# Patient Record
Sex: Female | Born: 1970
Health system: Southern US, Community
[De-identification: ages and names within clinical notes are randomized; demographics above are authoritative.]

## PROBLEM LIST (undated history)

## (undated) DIAGNOSIS — J449 Chronic obstructive pulmonary disease, unspecified: Secondary | ICD-10-CM

## (undated) DIAGNOSIS — E7800 Pure hypercholesterolemia, unspecified: Secondary | ICD-10-CM

## (undated) DIAGNOSIS — I10 Essential (primary) hypertension: Secondary | ICD-10-CM

## (undated) DIAGNOSIS — J439 Emphysema, unspecified: Secondary | ICD-10-CM

## (undated) DIAGNOSIS — E119 Type 2 diabetes mellitus without complications: Secondary | ICD-10-CM

## (undated) DIAGNOSIS — J45909 Unspecified asthma, uncomplicated: Secondary | ICD-10-CM

## (undated) HISTORY — PX: CYST EXCISION: SHX5701

---

## 2004-09-11 ENCOUNTER — Emergency Department (HOSPITAL_COMMUNITY): Admission: EM | Admit: 2004-09-11 | Discharge: 2004-09-11 | Payer: Self-pay | Admitting: Emergency Medicine

## 2005-01-25 ENCOUNTER — Inpatient Hospital Stay (HOSPITAL_COMMUNITY): Admission: EM | Admit: 2005-01-25 | Discharge: 2005-01-27 | Payer: Self-pay | Admitting: Emergency Medicine

## 2005-04-09 ENCOUNTER — Emergency Department (HOSPITAL_COMMUNITY): Admission: EM | Admit: 2005-04-09 | Discharge: 2005-04-09 | Payer: Self-pay | Admitting: Emergency Medicine

## 2005-04-09 ENCOUNTER — Ambulatory Visit: Payer: Self-pay | Admitting: Family Medicine

## 2005-04-10 ENCOUNTER — Ambulatory Visit: Payer: Self-pay | Admitting: *Deleted

## 2005-05-04 ENCOUNTER — Ambulatory Visit: Payer: Self-pay | Admitting: Family Medicine

## 2005-07-28 ENCOUNTER — Ambulatory Visit: Payer: Self-pay | Admitting: Internal Medicine

## 2005-10-13 ENCOUNTER — Ambulatory Visit: Payer: Self-pay | Admitting: Internal Medicine

## 2006-02-24 ENCOUNTER — Ambulatory Visit: Payer: Self-pay | Admitting: Internal Medicine

## 2006-06-01 ENCOUNTER — Ambulatory Visit: Payer: Self-pay | Admitting: Internal Medicine

## 2006-08-08 ENCOUNTER — Emergency Department (HOSPITAL_COMMUNITY): Admission: AD | Admit: 2006-08-08 | Discharge: 2006-08-08 | Payer: Self-pay | Admitting: Family Medicine

## 2008-09-05 ENCOUNTER — Emergency Department (HOSPITAL_COMMUNITY): Admission: EM | Admit: 2008-09-05 | Discharge: 2008-09-05 | Payer: Self-pay | Admitting: Emergency Medicine

## 2008-10-11 ENCOUNTER — Ambulatory Visit: Payer: Self-pay | Admitting: Internal Medicine

## 2008-11-04 ENCOUNTER — Emergency Department (HOSPITAL_COMMUNITY): Admission: EM | Admit: 2008-11-04 | Discharge: 2008-11-04 | Payer: Self-pay | Admitting: Emergency Medicine

## 2009-01-07 ENCOUNTER — Emergency Department (HOSPITAL_COMMUNITY): Admission: EM | Admit: 2009-01-07 | Discharge: 2009-01-07 | Payer: Self-pay | Admitting: Emergency Medicine

## 2009-02-06 ENCOUNTER — Ambulatory Visit: Payer: Self-pay | Admitting: Internal Medicine

## 2009-03-06 ENCOUNTER — Ambulatory Visit: Payer: Self-pay | Admitting: Internal Medicine

## 2009-03-06 LAB — CONVERTED CEMR LAB
ALT: 13 units/L (ref 0–35)
AST: 9 units/L (ref 0–37)
Albumin: 4.1 g/dL (ref 3.5–5.2)
BUN: 15 mg/dL (ref 6–23)
CO2: 22 meq/L (ref 19–32)
Calcium: 9.4 mg/dL (ref 8.4–10.5)
Chloride: 103 meq/L (ref 96–112)
Cholesterol: 194 mg/dL (ref 0–200)
Creatinine, Ser: 0.81 mg/dL (ref 0.40–1.20)
Eosinophils Absolute: 0 10*3/uL (ref 0.0–0.7)
Eosinophils Relative: 0 % (ref 0–5)
HCT: 44.4 % (ref 36.0–46.0)
HDL: 99 mg/dL (ref >39)
Hemoglobin: 15.1 g/dL — ABNORMAL HIGH (ref 12.0–15.0)
LH: 1.4 milliintl units/mL
Lymphocytes Relative: 37 % (ref 12–46)
Lymphs Abs: 4.8 10*3/uL — ABNORMAL HIGH (ref 0.7–4.0)
MCV: 87.4 fL (ref 78.0–100.0)
Monocytes Relative: 6 % (ref 3–12)
Potassium: 3.9 meq/L (ref 3.5–5.3)
RBC: 5.08 M/uL (ref 3.87–5.11)
TSH: 1.246 microintl units/mL (ref 0.350–4.500)
Total CHOL/HDL Ratio: 2
WBC: 12.9 10*3/uL — ABNORMAL HIGH (ref 4.0–10.5)

## 2009-04-02 ENCOUNTER — Emergency Department (HOSPITAL_COMMUNITY): Admission: EM | Admit: 2009-04-02 | Discharge: 2009-04-03 | Payer: Self-pay | Admitting: Emergency Medicine

## 2009-06-07 ENCOUNTER — Ambulatory Visit: Payer: Self-pay | Admitting: Internal Medicine

## 2009-07-17 ENCOUNTER — Ambulatory Visit: Payer: Self-pay | Admitting: Family Medicine

## 2009-07-19 ENCOUNTER — Ambulatory Visit: Payer: Self-pay | Admitting: Internal Medicine

## 2009-08-25 ENCOUNTER — Emergency Department (HOSPITAL_COMMUNITY): Admission: EM | Admit: 2009-08-25 | Discharge: 2009-08-25 | Payer: Self-pay | Admitting: Emergency Medicine

## 2009-08-29 ENCOUNTER — Ambulatory Visit: Payer: Self-pay | Admitting: Internal Medicine

## 2009-09-26 ENCOUNTER — Ambulatory Visit: Payer: Self-pay | Admitting: Internal Medicine

## 2009-11-02 ENCOUNTER — Emergency Department (HOSPITAL_COMMUNITY): Admission: EM | Admit: 2009-11-02 | Discharge: 2009-11-02 | Payer: Self-pay | Admitting: Emergency Medicine

## 2009-11-15 ENCOUNTER — Observation Stay (HOSPITAL_COMMUNITY): Admission: EM | Admit: 2009-11-15 | Discharge: 2009-11-15 | Payer: Self-pay | Admitting: Emergency Medicine

## 2009-11-25 ENCOUNTER — Emergency Department (HOSPITAL_COMMUNITY): Admission: EM | Admit: 2009-11-25 | Discharge: 2009-11-25 | Payer: Self-pay | Admitting: Emergency Medicine

## 2009-11-26 ENCOUNTER — Ambulatory Visit: Payer: Self-pay | Admitting: Internal Medicine

## 2009-11-26 LAB — CONVERTED CEMR LAB: Microalb, Ur: 4.6 mg/dL — ABNORMAL HIGH (ref 0.00–1.89)

## 2009-12-03 ENCOUNTER — Ambulatory Visit (HOSPITAL_COMMUNITY): Admission: RE | Admit: 2009-12-03 | Discharge: 2009-12-03 | Payer: Self-pay | Admitting: Internal Medicine

## 2009-12-12 ENCOUNTER — Encounter (INDEPENDENT_AMBULATORY_CARE_PROVIDER_SITE_OTHER): Payer: Self-pay | Admitting: *Deleted

## 2010-02-15 ENCOUNTER — Emergency Department (HOSPITAL_COMMUNITY): Admission: EM | Admit: 2010-02-15 | Discharge: 2010-02-15 | Payer: Self-pay | Admitting: Family Medicine

## 2010-03-03 ENCOUNTER — Emergency Department (HOSPITAL_COMMUNITY): Admission: EM | Admit: 2010-03-03 | Discharge: 2010-03-03 | Payer: Self-pay | Admitting: Emergency Medicine

## 2010-06-11 ENCOUNTER — Emergency Department (HOSPITAL_COMMUNITY): Admission: EM | Admit: 2010-06-11 | Discharge: 2010-06-11 | Payer: Self-pay | Admitting: Family Medicine

## 2010-07-27 ENCOUNTER — Ambulatory Visit: Payer: Self-pay | Admitting: Pulmonary Disease

## 2010-07-27 ENCOUNTER — Ambulatory Visit: Payer: Self-pay | Admitting: Cardiology

## 2010-07-27 ENCOUNTER — Inpatient Hospital Stay (HOSPITAL_COMMUNITY): Admission: EM | Admit: 2010-07-27 | Discharge: 2010-07-31 | Payer: Self-pay | Admitting: Emergency Medicine

## 2010-07-30 ENCOUNTER — Encounter (INDEPENDENT_AMBULATORY_CARE_PROVIDER_SITE_OTHER): Payer: Self-pay | Admitting: Family Medicine

## 2010-09-26 ENCOUNTER — Emergency Department (HOSPITAL_COMMUNITY): Admission: EM | Admit: 2010-09-26 | Discharge: 2010-09-26 | Payer: Self-pay | Admitting: Family Medicine

## 2010-10-29 ENCOUNTER — Emergency Department (HOSPITAL_COMMUNITY): Admission: EM | Admit: 2010-10-29 | Discharge: 2010-10-29 | Payer: Self-pay | Admitting: Family Medicine

## 2010-11-06 ENCOUNTER — Encounter (INDEPENDENT_AMBULATORY_CARE_PROVIDER_SITE_OTHER): Payer: Self-pay

## 2010-11-06 ENCOUNTER — Inpatient Hospital Stay (HOSPITAL_COMMUNITY): Admission: EM | Admit: 2010-11-06 | Discharge: 2010-11-09 | Payer: Self-pay | Admitting: Emergency Medicine

## 2011-01-11 ENCOUNTER — Encounter: Payer: Self-pay | Admitting: Internal Medicine

## 2011-02-09 ENCOUNTER — Ambulatory Visit (INDEPENDENT_AMBULATORY_CARE_PROVIDER_SITE_OTHER): Payer: Self-pay

## 2011-02-09 ENCOUNTER — Inpatient Hospital Stay (INDEPENDENT_AMBULATORY_CARE_PROVIDER_SITE_OTHER)
Admission: RE | Admit: 2011-02-09 | Discharge: 2011-02-09 | Disposition: A | Discharge status: Home / Self Care | Payer: Self-pay | Source: Ambulatory Visit | Attending: Emergency Medicine | Admitting: Emergency Medicine

## 2011-02-09 DIAGNOSIS — R062 Wheezing: Secondary | ICD-10-CM

## 2011-02-09 DIAGNOSIS — J45909 Unspecified asthma, uncomplicated: Secondary | ICD-10-CM

## 2011-02-09 LAB — POCT RAPID STREP A (OFFICE): Streptococcus, Group A Screen (Direct): NEGATIVE

## 2011-03-03 LAB — POCT URINALYSIS DIPSTICK
Nitrite: NEGATIVE
Protein, ur: NEGATIVE mg/dL
pH: 6 (ref 5.0–8.0)

## 2011-03-03 LAB — GLUCOSE, CAPILLARY
Glucose-Capillary: 151 mg/dL — ABNORMAL HIGH (ref 70–99)
Glucose-Capillary: 185 mg/dL — ABNORMAL HIGH (ref 70–99)
Glucose-Capillary: 190 mg/dL — ABNORMAL HIGH (ref 70–99)
Glucose-Capillary: 192 mg/dL — ABNORMAL HIGH (ref 70–99)
Glucose-Capillary: 228 mg/dL — ABNORMAL HIGH (ref 70–99)
Glucose-Capillary: 251 mg/dL — ABNORMAL HIGH (ref 70–99)
Glucose-Capillary: 263 mg/dL — ABNORMAL HIGH (ref 70–99)
Glucose-Capillary: 291 mg/dL — ABNORMAL HIGH (ref 70–99)
Glucose-Capillary: 299 mg/dL — ABNORMAL HIGH (ref 70–99)

## 2011-03-03 LAB — URINALYSIS, ROUTINE W REFLEX MICROSCOPIC
Glucose, UA: >1000 mg/dL — AB
Ketones, ur: 15 mg/dL — AB
Protein, ur: NEGATIVE mg/dL
pH: 6 (ref 5.0–8.0)

## 2011-03-03 LAB — BASIC METABOLIC PANEL
BUN: 10 mg/dL (ref 6–23)
BUN: 2 mg/dL — ABNORMAL LOW (ref 6–23)
Calcium: 8.2 mg/dL — ABNORMAL LOW (ref 8.4–10.5)
Calcium: 8.8 mg/dL (ref 8.4–10.5)
Creatinine, Ser: 1.02 mg/dL (ref 0.4–1.2)
GFR calc non Af Amer: >60 mL/min (ref >60)
GFR calc non Af Amer: >60 mL/min (ref >60)
Glucose, Bld: 281 mg/dL — ABNORMAL HIGH (ref 70–99)
Glucose, Bld: 442 mg/dL — ABNORMAL HIGH (ref 70–99)
Potassium: 3.7 mEq/L (ref 3.5–5.1)

## 2011-03-03 LAB — URINE MICROSCOPIC-ADD ON

## 2011-03-03 LAB — POCT I-STAT 7, (LYTES, BLD GAS, ICA,H+H)
Bicarbonate: 28 mEq/L — ABNORMAL HIGH (ref 20.0–24.0)
Hemoglobin: 13.9 g/dL (ref 12.0–15.0)
Patient temperature: 39
TCO2: 29 mmol/L (ref 0–100)
pCO2 arterial: 52.8 mmHg — ABNORMAL HIGH (ref 35.0–45.0)
pH, Arterial: 7.342 — ABNORMAL LOW (ref 7.350–7.400)
pO2, Arterial: 55 mmHg — ABNORMAL LOW (ref 80.0–100.0)

## 2011-03-03 LAB — DIFFERENTIAL
Eosinophils Absolute: 0 10*3/uL (ref 0.0–0.7)
Monocytes Absolute: 0.8 10*3/uL (ref 0.1–1.0)
Neutro Abs: 10.2 10*3/uL — ABNORMAL HIGH (ref 1.7–7.7)
Neutrophils Relative %: 72 % (ref 43–77)

## 2011-03-03 LAB — ANAEROBIC CULTURE

## 2011-03-03 LAB — CULTURE, ROUTINE-ABSCESS

## 2011-03-03 LAB — GRAM STAIN

## 2011-03-03 LAB — CULTURE, BLOOD (ROUTINE X 2)
Culture  Setup Time: 201111170849
Culture: NO GROWTH

## 2011-03-03 LAB — CBC
HCT: 35.6 % — ABNORMAL LOW (ref 36.0–46.0)
MCHC: 33.4 g/dL (ref 30.0–36.0)
MCHC: 35.4 g/dL (ref 30.0–36.0)
Platelets: 275 10*3/uL (ref 150–400)
RDW: 12.4 % (ref 11.5–15.5)
RDW: 12.4 % (ref 11.5–15.5)
WBC: 14.1 10*3/uL — ABNORMAL HIGH (ref 4.0–10.5)

## 2011-03-03 LAB — TSH: TSH: 1.993 u[IU]/mL (ref 0.350–4.500)

## 2011-03-03 LAB — POCT PREGNANCY, URINE
Preg Test, Ur: NEGATIVE
Preg Test, Ur: NEGATIVE

## 2011-03-04 LAB — POCT URINALYSIS DIPSTICK
Protein, ur: 30 mg/dL — AB
Specific Gravity, Urine: 1.025 (ref 1.005–1.030)
Urobilinogen, UA: 0.2 mg/dL (ref 0.0–1.0)
pH: 5.5 (ref 5.0–8.0)

## 2011-03-06 LAB — CARDIAC PANEL(CRET KIN+CKTOT+MB+TROPI)
CK, MB: 1.2 ng/mL (ref 0.3–4.0)
Relative Index: INVALID (ref 0.0–2.5)
Total CK: 56 U/L (ref 7–177)
Total CK: 57 U/L (ref 7–177)
Troponin I: 0.01 ng/mL (ref 0.00–0.06)
Troponin I: 0.02 ng/mL (ref 0.00–0.06)

## 2011-03-06 LAB — GLUCOSE, CAPILLARY
Glucose-Capillary: 263 mg/dL — ABNORMAL HIGH (ref 70–99)
Glucose-Capillary: 274 mg/dL — ABNORMAL HIGH (ref 70–99)
Glucose-Capillary: 300 mg/dL — ABNORMAL HIGH (ref 70–99)
Glucose-Capillary: 359 mg/dL — ABNORMAL HIGH (ref 70–99)
Glucose-Capillary: 377 mg/dL — ABNORMAL HIGH (ref 70–99)
Glucose-Capillary: 390 mg/dL — ABNORMAL HIGH (ref 70–99)
Glucose-Capillary: 407 mg/dL — ABNORMAL HIGH (ref 70–99)
Glucose-Capillary: 444 mg/dL — ABNORMAL HIGH (ref 70–99)

## 2011-03-06 LAB — URINE MICROSCOPIC-ADD ON

## 2011-03-06 LAB — POCT I-STAT, CHEM 8
Calcium, Ion: 1.06 mmol/L — ABNORMAL LOW (ref 1.12–1.32)
HCT: 41 % (ref 36.0–46.0)
Hemoglobin: 13.9 g/dL (ref 12.0–15.0)
Sodium: 139 mEq/L (ref 135–145)
TCO2: 18 mmol/L (ref 0–100)

## 2011-03-06 LAB — BASIC METABOLIC PANEL
BUN: 2 mg/dL — ABNORMAL LOW (ref 6–23)
CO2: 18 mEq/L — ABNORMAL LOW (ref 19–32)
CO2: 19 mEq/L (ref 19–32)
Calcium: 7 mg/dL — ABNORMAL LOW (ref 8.4–10.5)
Calcium: 7.8 mg/dL — ABNORMAL LOW (ref 8.4–10.5)
GFR calc Af Amer: >60 mL/min (ref >60)
GFR calc Af Amer: >60 mL/min (ref >60)
GFR calc non Af Amer: >60 mL/min (ref >60)
GFR calc non Af Amer: >60 mL/min (ref >60)
Glucose, Bld: 213 mg/dL — ABNORMAL HIGH (ref 70–99)
Glucose, Bld: 260 mg/dL — ABNORMAL HIGH (ref 70–99)
Glucose, Bld: 358 mg/dL — ABNORMAL HIGH (ref 70–99)
Potassium: 3 mEq/L — ABNORMAL LOW (ref 3.5–5.1)
Potassium: 3.5 mEq/L (ref 3.5–5.1)
Potassium: 4.1 mEq/L (ref 3.5–5.1)
Sodium: 135 mEq/L (ref 135–145)
Sodium: 138 mEq/L (ref 135–145)
Sodium: 139 mEq/L (ref 135–145)

## 2011-03-06 LAB — URINALYSIS, ROUTINE W REFLEX MICROSCOPIC
Bilirubin Urine: NEGATIVE
Glucose, UA: 250 mg/dL — AB
Ketones, ur: NEGATIVE mg/dL
Specific Gravity, Urine: 1.011 (ref 1.005–1.030)
pH: 5.5 (ref 5.0–8.0)

## 2011-03-06 LAB — DIFFERENTIAL
Basophils Absolute: 0 10*3/uL (ref 0.0–0.1)
Basophils Relative: 0 % (ref 0–1)
Eosinophils Absolute: 0.1 10*3/uL (ref 0.0–0.7)
Eosinophils Relative: 1 % (ref 0–5)
Monocytes Absolute: 0.1 10*3/uL (ref 0.1–1.0)
Monocytes Relative: 1 % — ABNORMAL LOW (ref 3–12)
Neutro Abs: 6.5 10*3/uL (ref 1.7–7.7)

## 2011-03-06 LAB — PROTIME-INR
INR: 1.23 (ref 0.00–1.49)
INR: 1.25 (ref 0.00–1.49)
Prothrombin Time: 15.7 seconds — ABNORMAL HIGH (ref 11.6–15.2)

## 2011-03-06 LAB — PROCALCITONIN: Procalcitonin: 21.33 ng/mL

## 2011-03-06 LAB — CBC
HCT: 32.7 % — ABNORMAL LOW (ref 36.0–46.0)
HCT: 32.8 % — ABNORMAL LOW (ref 36.0–46.0)
HCT: 32.8 % — ABNORMAL LOW (ref 36.0–46.0)
Hemoglobin: 10.8 g/dL — ABNORMAL LOW (ref 12.0–15.0)
Hemoglobin: 11.1 g/dL — ABNORMAL LOW (ref 12.0–15.0)
Hemoglobin: 11.2 g/dL — ABNORMAL LOW (ref 12.0–15.0)
Hemoglobin: 13.4 g/dL (ref 12.0–15.0)
MCH: 29.5 pg (ref 26.0–34.0)
MCH: 30.1 pg (ref 26.0–34.0)
MCH: 30.3 pg (ref 26.0–34.0)
MCHC: 33 g/dL (ref 30.0–36.0)
MCHC: 33.8 g/dL (ref 30.0–36.0)
MCHC: 34.1 g/dL (ref 30.0–36.0)
MCHC: 34.3 g/dL (ref 30.0–36.0)
MCV: 88.9 fL (ref 78.0–100.0)
Platelets: 221 10*3/uL (ref 150–400)
RBC: 3.78 MIL/uL — ABNORMAL LOW (ref 3.87–5.11)
RBC: 4.42 MIL/uL (ref 3.87–5.11)
RDW: 12.9 % (ref 11.5–15.5)
WBC: 9.5 10*3/uL (ref 4.0–10.5)

## 2011-03-06 LAB — CULTURE, BLOOD (ROUTINE X 2)
Culture: NO GROWTH
Culture: NO GROWTH

## 2011-03-06 LAB — POCT I-STAT 3, ART BLOOD GAS (G3+)
pCO2 arterial: 30.9 mmHg — ABNORMAL LOW (ref 35.0–45.0)
pH, Arterial: 7.342 — ABNORMAL LOW (ref 7.350–7.400)
pO2, Arterial: 73 mmHg — ABNORMAL LOW (ref 80.0–100.0)

## 2011-03-06 LAB — TYPE AND SCREEN: Antibody Screen: NEGATIVE

## 2011-03-06 LAB — HEMOGLOBIN A1C
Hgb A1c MFr Bld: 9 % — ABNORMAL HIGH (ref <5.7)
Mean Plasma Glucose: 212 mg/dL — ABNORMAL HIGH (ref <117)

## 2011-03-06 LAB — APTT: aPTT: 32 seconds (ref 24–37)

## 2011-03-06 LAB — LACTIC ACID, PLASMA: Lactic Acid, Venous: 1.3 mmol/L (ref 0.5–2.2)

## 2011-03-06 LAB — COMPREHENSIVE METABOLIC PANEL
ALT: 21 U/L (ref 0–35)
BUN: 5 mg/dL — ABNORMAL LOW (ref 6–23)
CO2: 22 mEq/L (ref 19–32)
Calcium: 7.2 mg/dL — ABNORMAL LOW (ref 8.4–10.5)
Creatinine, Ser: 0.66 mg/dL (ref 0.4–1.2)
Glucose, Bld: 274 mg/dL — ABNORMAL HIGH (ref 70–99)
Sodium: 140 mEq/L (ref 135–145)

## 2011-03-06 LAB — VANCOMYCIN, TROUGH: Vancomycin Tr: 15.9 ug/mL (ref 10.0–20.0)

## 2011-03-06 LAB — POCT CARDIAC MARKERS: Myoglobin, poc: 51.5 ng/mL (ref 12–200)

## 2011-03-06 LAB — MAGNESIUM: Magnesium: 1.7 mg/dL (ref 1.5–2.5)

## 2011-03-06 LAB — PHOSPHORUS
Phosphorus: 2 mg/dL — ABNORMAL LOW (ref 2.3–4.6)
Phosphorus: 2.4 mg/dL (ref 2.3–4.6)

## 2011-03-06 LAB — CARBOXYHEMOGLOBIN: Methemoglobin: 0.6 % (ref 0.0–1.5)

## 2011-03-06 LAB — BRAIN NATRIURETIC PEPTIDE: Pro B Natriuretic peptide (BNP): 64 pg/mL (ref 0.0–100.0)

## 2011-03-06 LAB — URINE CULTURE: Culture  Setup Time: 201108071756

## 2011-03-06 LAB — MRSA PCR SCREENING: MRSA by PCR: NEGATIVE

## 2011-03-06 LAB — POCT PREGNANCY, URINE: Preg Test, Ur: NEGATIVE

## 2011-03-06 LAB — ABO/RH: ABO/RH(D): O POS

## 2011-03-08 LAB — URINE CULTURE

## 2011-03-08 LAB — POCT URINALYSIS DIP (DEVICE)
Bilirubin Urine: NEGATIVE
Glucose, UA: 100 mg/dL — AB
Nitrite: POSITIVE — AB
Specific Gravity, Urine: 1.02 (ref 1.005–1.030)
Urobilinogen, UA: 0.2 mg/dL (ref 0.0–1.0)

## 2011-03-08 LAB — POCT PREGNANCY, URINE: Preg Test, Ur: NEGATIVE

## 2011-03-11 LAB — POCT URINALYSIS DIP (DEVICE)
Bilirubin Urine: NEGATIVE
Ketones, ur: NEGATIVE mg/dL
Protein, ur: 30 mg/dL — AB
pH: 5 (ref 5.0–8.0)

## 2011-03-25 LAB — POCT I-STAT, CHEM 8
BUN: 5 mg/dL — ABNORMAL LOW (ref 6–23)
Calcium, Ion: 1.09 mmol/L — ABNORMAL LOW (ref 1.12–1.32)
Creatinine, Ser: 0.7 mg/dL (ref 0.4–1.2)
Glucose, Bld: 315 mg/dL — ABNORMAL HIGH (ref 70–99)
TCO2: 19 mmol/L (ref 0–100)

## 2011-03-25 LAB — URINE CULTURE

## 2011-03-25 LAB — URINE MICROSCOPIC-ADD ON

## 2011-03-25 LAB — URINALYSIS, ROUTINE W REFLEX MICROSCOPIC
Bilirubin Urine: NEGATIVE
Ketones, ur: 15 mg/dL — AB
Specific Gravity, Urine: 1.017 (ref 1.005–1.030)
pH: 7 (ref 5.0–8.0)

## 2011-03-27 LAB — COMPREHENSIVE METABOLIC PANEL
AST: 18 U/L (ref 0–37)
CO2: 24 mEq/L (ref 19–32)
Calcium: 9 mg/dL (ref 8.4–10.5)
Creatinine, Ser: 0.8 mg/dL (ref 0.4–1.2)
GFR calc Af Amer: >60 mL/min (ref >60)
GFR calc non Af Amer: >60 mL/min (ref >60)

## 2011-03-27 LAB — CBC
MCHC: 34 g/dL (ref 30.0–36.0)
MCV: 88.7 fL (ref 78.0–100.0)
RBC: 4.45 MIL/uL (ref 3.87–5.11)
RDW: 12.6 % (ref 11.5–15.5)

## 2011-03-27 LAB — URINE MICROSCOPIC-ADD ON

## 2011-03-27 LAB — URINALYSIS, ROUTINE W REFLEX MICROSCOPIC
Bilirubin Urine: NEGATIVE
Glucose, UA: 100 mg/dL — AB
Specific Gravity, Urine: 1.012 (ref 1.005–1.030)
pH: 6 (ref 5.0–8.0)

## 2011-03-27 LAB — LIPASE, BLOOD: Lipase: 22 U/L (ref 11–59)

## 2011-03-27 LAB — WET PREP, GENITAL
Trich, Wet Prep: NONE SEEN
Yeast Wet Prep HPF POC: NONE SEEN

## 2011-03-27 LAB — DIFFERENTIAL
Eosinophils Relative: 0 % (ref 0–5)
Lymphocytes Relative: 18 % (ref 12–46)
Lymphs Abs: 2.6 10*3/uL (ref 0.7–4.0)
Neutrophils Relative %: 77 % (ref 43–77)

## 2011-03-27 LAB — URINE CULTURE

## 2011-03-27 LAB — HCG, SERUM, QUALITATIVE: Preg, Serum: NEGATIVE

## 2011-04-21 ENCOUNTER — Inpatient Hospital Stay (INDEPENDENT_AMBULATORY_CARE_PROVIDER_SITE_OTHER)
Admission: RE | Admit: 2011-04-21 | Discharge: 2011-04-21 | Disposition: A | Discharge status: Home / Self Care | Payer: Self-pay | Source: Ambulatory Visit

## 2011-04-21 ENCOUNTER — Ambulatory Visit (INDEPENDENT_AMBULATORY_CARE_PROVIDER_SITE_OTHER): Payer: Self-pay

## 2011-04-21 DIAGNOSIS — J45909 Unspecified asthma, uncomplicated: Secondary | ICD-10-CM

## 2011-05-08 NOTE — Discharge Summary (Signed)
Carla Hicks, Carla Hicks              ACCOUNT NO.:  [id number]   MEDICAL RECORD NO.:  [id number]          PATIENT TYPE:  INP   LOCATION:  0471                         FACILITY:  [facility]   PHYSICIAN:  Melissa L. Ladona Ridgel, MD  DATE OF BIRTH:  1971/10/20   DATE OF ADMISSION:  01/24/2005  DATE OF DISCHARGE:  01/27/2005                                 DISCHARGE SUMMARY   DISCHARGE DIAGNOSES:  1.  Right middle lobe pneumonia:  Patient's pulmonary course had been      complicated by the fact that she is on chronic prednisone and is further      compromised by a history of alpha anti-tripsin deficiency.  The      patient's saturations today revealed 85% on two liters.  I have      expressed to this patient that I would prefer his stay in the hospital      for further IV antibiotics and care until it is obvious that her      pneumonia has resolved enough to maintain reasonable saturations.  The      patient refuses to stay, citing that she feels she is neglecting her      children, who are also ill.  We attempted to reason with the patient,      but she was unable to extract the possibility that she herself could      become increasingly ill, further causing her to be neglectful of her      children.  Patient has a Education officer, environmental, who she is unwilling to charge with      the task of providing for the care of her children while she is in the      hospital.  Patient will therefore leave against medical advice.  I will      provide her with antibiotics, home oxygen.  The patient has home      nebulizers and has a CPAP machine.  We will request that she follow up      with her outpatient physician as early as the end of this week.  2.  History of hypertension:  At this time, the patient's blood pressure      remains stable.  She did not appear to be septic.  3.  Patient has a history of insulin resistance and hyperglycemia.  At      present, her CBGs have ranged from 150 as high as 242.  At this time, I  am uncomfortable starting her on medication for diabetes and will      request that she follow up with her primary care practitioner later this      week for evaluation.   DISCHARGE MEDICATIONS:  1.  Prednisone 60 mg q.d., which is her home dose.  2.  Albuterol 2.5 mg nebulizer q.4h. and Atrovent 0.5 mg q.4h.  3.  Moxifloxacin 400 mg p.o. q.d.  (This has been reconsidered because of      the obvious urinary tract infection).  We will start her on Cipro 500      p.o. q.12h. and azithromycin 500 p.o. q.d.  4.  O2 2 liters via nasal cannula.   HISTORY OF PRESENT ILLNESS:  This is a 40 year old African-American female  with known alpha antitrypsin deficiency, who presented to the emergency room  on the day of admission with nausea, vomiting, and productive cough.  The  patient gave a history of subjective fever.  On further evaluation was found  to have an elevated temperature of 100.2 and a chest x-ray consistent with  right middle lobe pneumonia.  The patient had been started on  methylprednisolone and was tapered back down to her home dose of 50 of  prednisone.  She was treated with IV cefuroxime and azithromycin for one  day.  Yesterday, the patient refused an IV restarting; therefore, she did  not receive antibiotics today.  She will therefore need to take a dose of  her moxifloxacin today.  A long discussion ensued between myself and the  patient, and I also included our case manager, but we were unable to  convince her to stay until she is feeling better.  The patient will  therefore discharge against medical advice with at least some medical  support.   DISCHARGE PHYSICAL EXAMINATION:  VITAL SIGNS:  Her temperature is 98, blood  pressure 118/83, pulse 102, respiratory rate 20, saturations 85% on room  air.  GENERAL:  This is an obese African-American female in no apparent  respiratory distress.  HEENT:  Pupils are equal, round and reactive to light.  Extraocular muscles  are  intact.  Mucous membranes are moist.  NECK:  Supple.  There is no JVD.  No lymph nodes. No carotid bruits.  CHEST:  Decreased bibasilarly with end-expiratory wheeze bilaterally.  CARDIOVASCULAR:  Tachycardic.  Positive S1 and S2.  No S3 or S4, murmurs,  rubs or gallops.  ABDOMEN:  Obese, nontender, nondistended with positive bowel sounds.  EXTREMITIES:  No clubbing, cyanosis or edema.   PERTINENT LABORATORY VALUES:  There were no laboratories documented today;  however, yesterday, ABG reveals a pH of 7.4, pCO2 40, pO2 58.5.  Her  hemoglobin A1C is 6.2.  Her most recent BUN is 10.  Creatinine 0.9.  Sodium  133, potassium 4.8, chloride 102, CO2 22.  The most recent CBC revealed a  white count of 10.2, hemoglobin 15.6, hematocrit 45.7, platelets 124 with  neutrophils predominant at 83%.  At the time of discharge, the patient's  blood cultures showed no growth.  Her urinalysis revealed positive nitrites  and moderate leukocyte esterase, consistent with a UTI.  We will therefore  be forced to discontinue the moxifloxacin and consider Cipro.   We recommend the patient follow up with her primary care physician, Dr.  Marinell Blight, at the end of this week for evaluation of her blood sugars and  followup of current infection.  The patient did receive an influenza and  Pneumovax during the course of her hospital stay.   CONDITION ON DISCHARGE:  Patient is in fair but stable condition.  She has  the potential for deterioration in her condition and has been forewarned of  the possibility of respiratory distress that may require possible mechanical  ventilation in intensive care.      MLT/MEDQ  D:  01/27/2005  T:  01/27/2005  Job:  [id number]   cc:   Dr. Marinell Blight

## 2011-05-08 NOTE — H&P (Signed)
NAME:  Carla Hicks, Carla Hicks NO.:  [id number]   MEDICAL RECORD NO.:  [id number]          PATIENT TYPE:  INP   LOCATION:  0471                         FACILITY:  [facility]   PHYSICIAN:  Loyola Mast, MD       DATE OF BIRTH:  1971-08-03   DATE OF ADMISSION:  01/24/2005  DATE OF DISCHARGE:                                HISTORY & PHYSICAL   PRIMARY CARE PHYSICIAN:  Marinell Blight, M.D.   CHIEF COMPLAINT:  Shortness of breath.   HISTORY OF PRESENT ILLNESS:  This is a 40 year old African-American female  who presents with nausea, vomiting, productive cough, subjective fevers for  further evaluation.  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  She does not know the prevalence of MRSA in the  community.  In addition, the patient has longstanding asthma for which she  is on oral prednisone as well as inhaled albuterol.  She has increased her  albuterol usage.   PAST MEDICAL HISTORY:  1.  Antitrypsin deficiency diagnosed at the [facility] [place].  2.  Asthma present since childhood.  No history of intubation.  She said      previous pulmonologists have tried to wean her prednisone to no      significant avail.  3.  Morbid obesity.  4.  History of hypertension.  5.  Insulin resistance and hyperglycemia.   ALLERGIES:  PENICILLIN which give her hives   MEDICATIONS:  1.  Prednisone 60 mg p.o. daily.  2.  Albuterol p.r.n..  3.  She has a prior history of Advair use.   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  She  says there are several infectious illnesses being transmitted among the  residents in the assisted living.  She currently does smoke.  She does not  drink.   FAMILY HISTORY:  Her mother has diabetes and hypertension.  Her father also  has diabetes and hypertension.  Her grandfather apparently had an  alpha I  antitrypsin  disorder.   REVIEW OF SYSTEMS:  GENERAL:  There have been no changes in her weight.  She  says her vision has been blurry.  CARDIOVASCULAR:  There is no chest pain,  no orthopnea.  RESPIRATORY:  No hemoptysis.  GI:  She does not have a know  history of liver disease.  ENDOCRINE:  She has been treated in the past for  diabetes.  NEUROLOGIC:  No seizures.  No history of major depression.  DERM:  No changes in her skin or presence of lesions.  HEME:  No easy bleeding or  bruising.   PHYSICAL EXAMINATION:  VITAL SIGNS:  Temperature 100.2. Her heart rate  ranges from 113 to 140.  Her blood pressure is 113/76.  Her initial oxygen  saturations were 82% on room air.  GENERAL:  She is a morbidly obese African-American female.  HEENT:  She is cushingoid.  There is no evidence of thrush.  CARDIOVASCULAR:  She is tachycardic.  Heart sounds are distant due to her  soft tissue plethora.  RESPIRATORY:  She has diffuse bilateral wheezes at the bases.  GI:  She is obese. There is no organomegaly appreciated.  There are tattoos  seen on the chest wall.  EXTREMITIES:  No edema. She is warm.   LABORATORY DATA:  White count 11.5, 81% segmented neutrophils, hematocrit  44.1, hemoglobin 14.9.  Platelets 279.  Sodium 129, potassium 3.8, chloride  99, bicarb 22, anion gap 8, BUN 5, creatinine 0.9, glucose 195, alk phos 45,  and AST and ALT is 45 and 13, respectively.  Total bilirubin 1.1, total  protein 7.4, albumin 3.2.  Globulin 4.2.  ABG shows pH 7.445, PCO2 34.3,  paO2 41.8 on room air.  Her A-a gradient is calculated at 65.  PaO2 of 41  correlated an oxygen saturation of 77%.  Chest x-ray shows a right middle  lobe infiltrate.  Urinalysis shows specific gravity is 1.013, pH 6.5;  otherwise was negative.   ASSESSMENT/PLAN:  1.  Pneumonia/asthma.  She will be treated with steroids for the next two      doses with methylprednisolone.  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  She will also receive      antibiotics; namely cefuroxime 2 g IV q.6h. and azithromycin 500 mg IV      daily.  Her differential also does include unusual organisms, namely      Aspergillus and the possibility of APBA exists.  However, this is      usually more isolated to the upper lobes rather than the lower lobes.      The patient's chronic prednisone use also does predispose her to other      select sets of organisms.  If the patient does not empirically improve      with the current antibiotic regimen, further evaluation, mainly      bronchoscopy, may be indicated.  She will also receive albuterol and      Atrovent nebulized inhalers.  She will also receive influenza and      Pneumovax vaccine.  2.  Insulin-resistant hyperglycemia.  She will be treated with aggressive      sliding scale protocol.      JMJ/MEDQ  D:  01/25/2005  T:  01/25/2005  Job:  [id number]

## 2011-06-22 ENCOUNTER — Inpatient Hospital Stay (INDEPENDENT_AMBULATORY_CARE_PROVIDER_SITE_OTHER)
Admission: RE | Admit: 2011-06-22 | Discharge: 2011-06-22 | Disposition: A | Discharge status: Home / Self Care | Payer: Self-pay | Source: Ambulatory Visit | Attending: Family Medicine | Admitting: Family Medicine

## 2011-06-22 DIAGNOSIS — J45909 Unspecified asthma, uncomplicated: Secondary | ICD-10-CM

## 2011-06-22 LAB — POCT URINALYSIS DIP (DEVICE)
Glucose, UA: NEGATIVE mg/dL
Hgb urine dipstick: NEGATIVE
Ketones, ur: 15 mg/dL — AB
Nitrite: NEGATIVE
Specific Gravity, Urine: 1.025 (ref 1.005–1.030)
pH: 5.5 (ref 5.0–8.0)

## 2011-08-12 ENCOUNTER — Inpatient Hospital Stay (INDEPENDENT_AMBULATORY_CARE_PROVIDER_SITE_OTHER)
Admission: RE | Admit: 2011-08-12 | Discharge: 2011-08-12 | Disposition: A | Discharge status: Home / Self Care | Payer: Self-pay | Source: Ambulatory Visit | Attending: Family Medicine | Admitting: Family Medicine

## 2011-08-12 DIAGNOSIS — J449 Chronic obstructive pulmonary disease, unspecified: Secondary | ICD-10-CM

## 2011-09-19 ENCOUNTER — Observation Stay (HOSPITAL_COMMUNITY)
Admission: EM | Admit: 2011-09-19 | Discharge: 2011-09-19 | Disposition: A | Discharge status: Home / Self Care | Payer: Self-pay | Attending: Emergency Medicine | Admitting: Emergency Medicine

## 2011-09-19 DIAGNOSIS — Y92009 Unspecified place in unspecified non-institutional (private) residence as the place of occurrence of the external cause: Secondary | ICD-10-CM | POA: Insufficient documentation

## 2011-09-19 DIAGNOSIS — R0609 Other forms of dyspnea: Secondary | ICD-10-CM | POA: Insufficient documentation

## 2011-09-19 DIAGNOSIS — I509 Heart failure, unspecified: Secondary | ICD-10-CM | POA: Insufficient documentation

## 2011-09-19 DIAGNOSIS — R0989 Other specified symptoms and signs involving the circulatory and respiratory systems: Secondary | ICD-10-CM | POA: Insufficient documentation

## 2011-09-19 DIAGNOSIS — E8801 Alpha-1-antitrypsin deficiency: Secondary | ICD-10-CM | POA: Insufficient documentation

## 2011-09-19 DIAGNOSIS — L500 Allergic urticaria: Secondary | ICD-10-CM | POA: Insufficient documentation

## 2011-09-19 DIAGNOSIS — I10 Essential (primary) hypertension: Secondary | ICD-10-CM | POA: Insufficient documentation

## 2011-09-19 DIAGNOSIS — E119 Type 2 diabetes mellitus without complications: Secondary | ICD-10-CM | POA: Insufficient documentation

## 2011-09-19 DIAGNOSIS — T7840XA Allergy, unspecified, initial encounter: Principal | ICD-10-CM | POA: Insufficient documentation

## 2011-09-19 DIAGNOSIS — J438 Other emphysema: Secondary | ICD-10-CM | POA: Insufficient documentation

## 2011-09-19 LAB — POCT I-STAT 3, VENOUS BLOOD GAS (G3P V)
Acid-base deficit: 8 mmol/L — ABNORMAL HIGH (ref 0.0–2.0)
pCO2, Ven: 34.5 mmHg — ABNORMAL LOW (ref 45.0–50.0)
pO2, Ven: 53 mmHg — ABNORMAL HIGH (ref 30.0–45.0)

## 2011-09-19 LAB — CBC
HCT: 38.7 % (ref 36.0–46.0)
Hemoglobin: 13.6 g/dL (ref 12.0–15.0)
MCH: 30.8 pg (ref 26.0–34.0)
MCHC: 35.1 g/dL (ref 30.0–36.0)
RBC: 4.41 MIL/uL (ref 3.87–5.11)

## 2011-09-19 LAB — URINALYSIS, ROUTINE W REFLEX MICROSCOPIC
Bilirubin Urine: NEGATIVE
Ketones, ur: 15 mg/dL — AB
Leukocytes, UA: NEGATIVE
Nitrite: NEGATIVE
Urobilinogen, UA: 0.2 mg/dL (ref 0.0–1.0)
pH: 5 (ref 5.0–8.0)

## 2011-09-19 LAB — URINE MICROSCOPIC-ADD ON

## 2011-09-19 LAB — BASIC METABOLIC PANEL
BUN: 9 mg/dL (ref 6–23)
CO2: 17 mEq/L — ABNORMAL LOW (ref 19–32)
Calcium: 8.6 mg/dL (ref 8.4–10.5)
Chloride: 100 mEq/L (ref 96–112)
Creatinine, Ser: 0.71 mg/dL (ref 0.50–1.10)
Glucose, Bld: 460 mg/dL — ABNORMAL HIGH (ref 70–99)

## 2011-09-19 LAB — POCT I-STAT, CHEM 8
BUN: 7 mg/dL (ref 6–23)
Creatinine, Ser: 0.9 mg/dL (ref 0.50–1.10)
Glucose, Bld: 507 mg/dL — ABNORMAL HIGH (ref 70–99)
Sodium: 136 mEq/L (ref 135–145)
TCO2: 18 mmol/L (ref 0–100)

## 2011-09-19 LAB — GLUCOSE, CAPILLARY: Glucose-Capillary: 440 mg/dL — ABNORMAL HIGH (ref 70–99)

## 2011-09-21 LAB — DIFFERENTIAL
Basophils Absolute: 0.1
Basophils Relative: 1
Eosinophils Absolute: 0.6
Monocytes Relative: 6
Neutro Abs: 5.3
Neutrophils Relative %: 55

## 2011-09-21 LAB — BASIC METABOLIC PANEL
BUN: 5 — ABNORMAL LOW
CO2: 26
Calcium: 8.9
Chloride: 105
Creatinine, Ser: 0.66
GFR calc Af Amer: >60
Glucose, Bld: 180 — ABNORMAL HIGH

## 2011-09-21 LAB — POCT I-STAT, CHEM 8
Chloride: 104
Glucose, Bld: 177 — ABNORMAL HIGH
HCT: 42
Hemoglobin: 14.3
Potassium: 3.5

## 2011-09-21 LAB — URINE MICROSCOPIC-ADD ON

## 2011-09-21 LAB — URINALYSIS, ROUTINE W REFLEX MICROSCOPIC
Bilirubin Urine: NEGATIVE
Glucose, UA: 250 — AB
Ketones, ur: NEGATIVE
Protein, ur: NEGATIVE
pH: 6

## 2011-09-21 LAB — POCT CARDIAC MARKERS
CKMB, poc: <1 — ABNORMAL LOW
Myoglobin, poc: 21.4
Myoglobin, poc: 33.2
Troponin i, poc: <0.05
Troponin i, poc: <0.05

## 2011-09-21 LAB — CBC
MCHC: 33.5
MCV: 88.1
Platelets: 306
RBC: 4.66
RDW: 12.6

## 2011-09-22 LAB — CBC
MCHC: 33.4
MCV: 88.8
Platelets: 253
RBC: 4.43
RDW: 13

## 2011-09-22 LAB — URINALYSIS, ROUTINE W REFLEX MICROSCOPIC
Bilirubin Urine: NEGATIVE
Ketones, ur: NEGATIVE
Nitrite: NEGATIVE
Protein, ur: NEGATIVE
Urobilinogen, UA: 0.2
pH: 6

## 2011-09-22 LAB — DIFFERENTIAL
Eosinophils Relative: 3
Lymphocytes Relative: 30
Lymphs Abs: 2.8

## 2011-09-22 LAB — COMPREHENSIVE METABOLIC PANEL
AST: 10
CO2: 25
Calcium: 8.4
Creatinine, Ser: 0.55
GFR calc Af Amer: >60
GFR calc non Af Amer: >60
Total Protein: 5.8 — ABNORMAL LOW

## 2011-09-22 LAB — URINE CULTURE: Colony Count: >=100000

## 2011-09-22 LAB — LIPASE, BLOOD: Lipase: 23

## 2012-03-01 ENCOUNTER — Emergency Department (INDEPENDENT_AMBULATORY_CARE_PROVIDER_SITE_OTHER)
Admission: EM | Admit: 2012-03-01 | Discharge: 2012-03-01 | Disposition: A | Discharge status: Home / Self Care | Payer: Self-pay | Source: Home / Self Care | Attending: Family Medicine | Admitting: Family Medicine

## 2012-03-01 ENCOUNTER — Encounter (HOSPITAL_COMMUNITY): Payer: Self-pay | Admitting: Emergency Medicine

## 2012-03-01 DIAGNOSIS — N390 Urinary tract infection, site not specified: Secondary | ICD-10-CM

## 2012-03-01 DIAGNOSIS — J310 Chronic rhinitis: Secondary | ICD-10-CM

## 2012-03-01 HISTORY — DX: Essential (primary) hypertension: I10

## 2012-03-01 LAB — POCT URINALYSIS DIP (DEVICE)
Bilirubin Urine: NEGATIVE
Glucose, UA: 500 mg/dL — AB
Leukocytes, UA: NEGATIVE
Nitrite: NEGATIVE
Urobilinogen, UA: 0.2 mg/dL (ref 0.0–1.0)

## 2012-03-01 LAB — POCT PREGNANCY, URINE: Preg Test, Ur: NEGATIVE

## 2012-03-01 MED ORDER — SULFAMETHOXAZOLE-TRIMETHOPRIM 800-160 MG PO TABS: 1.0000 | ORAL_TABLET | ORAL | Status: DC

## 2012-03-01 MED ORDER — FLUTICASONE PROPIONATE 50 MCG/ACT NA SUSP: 2.0000 | Freq: Every day | NASAL | Status: DC

## 2012-03-01 NOTE — Discharge Instructions (Signed)
For your ear pain and nasal congestion, I suggest using an over-the-counter nasal saline spray. This will relieve congestion. I've refilled her Bactrim prescription. However, as discussed, I would discuss the dosage and regimen, with your primary care provider. Per review of medical literature, this dose seems to high. Also, you may call [facility] hospitals at [phone], which is the main operator line and inquire about the primary care clinics (the Department of Family Medicine, [phone]; or the Department of Internal Medicine). They have specialists, such as Urology, available as well. I think that you might need evaluation by a Urologist for management or treatment of your recurrent UTIs. Return to care should your symptoms not improve, or worsen in any way.

## 2012-03-01 NOTE — ED Provider Notes (Signed)
History     CSN: [id number]  Arrival date & time 03/01/12  1644   First MD Initiated Contact with Patient 03/01/12 1710      No chief complaint on file.   (Consider location/radiation/quality/duration/timing/severity/associated sxs/prior treatment) HPI Comments: Carla Hicks presents for evaluation of several complaints. She reports a hx of recurrent UTIs. She's currently being treated with Bactrim double strength 3 times a week every Monday, Wednesday, and Friday. She also reports persistent left ear pain. She reports nasal congestion, but no rhinorrhea. She denies any history of seasonal allergies, and denies any fever. She also reports a history of alpha-1 antitrypsin deficiency for which she takes daily prednisone currently at 60 mg. Patient is a 41 y.o. female presenting with ear pain. The history is provided by the patient.  Otalgia This is a new problem. The current episode started more than 1 week ago. There is pain in the left ear. The problem occurs constantly. The problem has not changed since onset.There has been no fever. Associated symptoms include cough. Pertinent negatives include no ear discharge and no rhinorrhea.    No past medical history on file.  No past surgical history on file.  No family history on file.  History  Substance Use Topics  . Smoking status: Not on file  . Smokeless tobacco: Not on file  . Alcohol Use: Not on file    OB History    No data available      Review of Systems  Constitutional: Negative.   HENT: Positive for ear pain and congestion. Negative for rhinorrhea and ear discharge.   Eyes: Negative.   Respiratory: Positive for cough.   Cardiovascular: Negative.   Gastrointestinal: Negative.   Genitourinary: Positive for dysuria and flank pain. Negative for urgency.  Musculoskeletal: Negative.   Skin: Negative.   Neurological: Negative.     Allergies  Review of patient's allergies indicates not on file.  Home Medications  No  current outpatient prescriptions on file.  BP 147/101  Pulse 106  Temp(Src) 98.6 F (37 C) (Oral)  Resp 16  SpO2 96%  Physical Exam  Nursing note and vitals reviewed. Constitutional: She is oriented to person, place, and time. She appears well-developed and well-nourished.  HENT:  Head: Normocephalic and atraumatic.  Right Ear: Tympanic membrane is retracted.  Left Ear: Tympanic membrane is retracted.  Mouth/Throat: Uvula is midline, oropharynx is clear and moist and mucous membranes are normal.  Eyes: EOM are normal.  Neck: Normal range of motion.  Pulmonary/Chest: Effort normal.  Musculoskeletal: Normal range of motion.  Neurological: She is alert and oriented to person, place, and time.  Skin: Skin is warm and dry.  Psychiatric: Her behavior is normal.    ED Course  Procedures (including critical care time)  Labs Reviewed - No data to display No results found.   No diagnosis found.    MDM  Given rx for fluticasone; advised use of nasal saline spray if not able to afford fluticasone; given contact information for [facility] since she is uninsured        Renaee Munda, MD 03/01/12 1911

## 2012-03-01 NOTE — ED Notes (Signed)
Pt. Stated,I've got an ear ache and my left side to back is hurting

## 2012-07-25 ENCOUNTER — Emergency Department (INDEPENDENT_AMBULATORY_CARE_PROVIDER_SITE_OTHER): Payer: Self-pay

## 2012-07-25 ENCOUNTER — Emergency Department (INDEPENDENT_AMBULATORY_CARE_PROVIDER_SITE_OTHER)
Admission: EM | Admit: 2012-07-25 | Discharge: 2012-07-25 | Disposition: A | Discharge status: Home / Self Care | Payer: Self-pay | Source: Home / Self Care

## 2012-07-25 ENCOUNTER — Encounter (HOSPITAL_COMMUNITY): Payer: Self-pay | Admitting: Emergency Medicine

## 2012-07-25 DIAGNOSIS — J45901 Unspecified asthma with (acute) exacerbation: Secondary | ICD-10-CM

## 2012-07-25 DIAGNOSIS — J40 Bronchitis, not specified as acute or chronic: Secondary | ICD-10-CM

## 2012-07-25 DIAGNOSIS — N390 Urinary tract infection, site not specified: Secondary | ICD-10-CM

## 2012-07-25 HISTORY — DX: Unspecified asthma, uncomplicated: J45.909

## 2012-07-25 LAB — POCT URINALYSIS DIP (DEVICE)
Bilirubin Urine: NEGATIVE
Glucose, UA: 500 mg/dL — AB
Leukocytes, UA: NEGATIVE
Nitrite: POSITIVE — AB
Urobilinogen, UA: 0.2 mg/dL (ref 0.0–1.0)

## 2012-07-25 LAB — POCT PREGNANCY, URINE: Preg Test, Ur: NEGATIVE

## 2012-07-25 MED ORDER — IPRATROPIUM-ALBUTEROL 0.5-2.5 (3) MG/3ML IN SOLN
3.0000 mL | Freq: Once | RESPIRATORY_TRACT | Status: AC
  Administered 2012-07-25: 3 mL via RESPIRATORY_TRACT

## 2012-07-25 MED ORDER — METHYLPREDNISOLONE SODIUM SUCC 125 MG IJ SOLR
INTRAMUSCULAR | Status: AC
  Filled 2012-07-25: qty 2

## 2012-07-25 MED ORDER — ALBUTEROL SULFATE (5 MG/ML) 0.5% IN NEBU
INHALATION_SOLUTION | RESPIRATORY_TRACT | Status: AC
  Filled 2012-07-25: qty 1

## 2012-07-25 MED ORDER — METHYLPREDNISOLONE SODIUM SUCC 125 MG IJ SOLR: 80.0000 mg | Freq: Once | INTRAMUSCULAR | Status: DC

## 2012-07-25 MED ORDER — SULFAMETHOXAZOLE-TRIMETHOPRIM 800-160 MG PO TABS: 1.0000 | ORAL_TABLET | Freq: Two times a day (BID) | ORAL | Status: AC

## 2012-07-25 MED ORDER — CEFTRIAXONE SODIUM 1 G IJ SOLR
1.0000 g | Freq: Once | INTRAMUSCULAR | Status: AC
  Administered 2012-07-25: 1 g via INTRAMUSCULAR

## 2012-07-25 MED ORDER — LIDOCAINE HCL (PF) 1 % IJ SOLN
INTRAMUSCULAR | Status: AC
  Filled 2012-07-25: qty 5

## 2012-07-25 MED ORDER — METHYLPREDNISOLONE SODIUM SUCC 125 MG IJ SOLR
125.0000 mg | Freq: Once | INTRAMUSCULAR | Status: AC
  Administered 2012-07-25: 125 mg via INTRAMUSCULAR

## 2012-07-25 MED ORDER — PREDNISONE 20 MG PO TABS: 80.0000 mg | ORAL_TABLET | Freq: Every day | ORAL | Status: DC

## 2012-07-25 MED ORDER — SULFAMETHOXAZOLE-TRIMETHOPRIM 800-160 MG PO TABS: 1.0000 | ORAL_TABLET | Freq: Two times a day (BID) | ORAL | Status: DC

## 2012-07-25 MED ORDER — CEFTRIAXONE SODIUM 1 G IJ SOLR
INTRAMUSCULAR | Status: AC
  Filled 2012-07-25: qty 10

## 2012-07-25 NOTE — ED Notes (Signed)
Patient not in treatment room.  Patient gone to radiology for chest xray.

## 2012-07-25 NOTE — ED Notes (Signed)
Notified physician , patient asking questions related to purpose of medicines

## 2012-07-25 NOTE — ED Provider Notes (Addendum)
History     CSN: [id number]  Arrival date & time 07/25/12  1101   First MD Initiated Contact with Patient 07/25/12 1102      No chief complaint on file.  41 yr old with history of childhood asthma, alpha-1 antitrypsin trait, CHF with diastolic dysfunction, morbid obesity, obstructive sleep apnea, presents today to the urgent care Center with a one day history of productive yellow sputum, increasing wheeze, shortness of breath, and overall malaise. States that she was doing well yesterday until she started developing some shortness of breath.  She noticed a fever and some congestion and increased the use of her albuterol and Xopenex from every 6 hours to every 4 hours.  She does use inhalers as well as nebulizers and does have a nebulizer at home.  She last saw her primary care physician Dr. Sol Blazing to about 3 months ago.  She is on chronic steroids and takes prednisone 60 mg.  She has been tapered down from a reported 80 mg by the [facility] physician that she saw.  She does not currently smoke does not have any young children or sick contacts at home.  She does have history of seasonal allergies. Patient states after treatment that she has felt some pressure in her bladder from 7/31 2013 as she ran out of for chronic suppressive UTI therapy with Bactrim which takes Monday Wednesday and Friday. She requests that we check her urine.  HPI  Past Medical History  Diagnosis Date  . Diabetes mellitus   . Hypertension   . Urinary bladder disorder     No past surgical history on file.  No family history on file.  History  Substance Use Topics  . Smoking status: Not on file  . Smokeless tobacco: Not on file  . Alcohol Use: No    OB History    Grav Para Term Preterm Abortions TAB SAB Ect Mult Living                  Review of Systems Denies any chest pain, any blurred vision or double vision positive propane, no swelling in her neck, no burning in the urine, no loss of consciousness, no  double vision, no weakness in any one side of the body, no diarrhea, no constipation, positive cough with productive sputum, no rash   Allergies  Penicillins  Home Medications   Current Outpatient Rx  Name Route Sig Dispense Refill  . ALBUTEROL SULFATE (5 MG/ML) 0.5% IN NEBU Nebulization Take 2.5 mg by nebulization every 6 (six) hours as needed.    Marland Kitchen FLUTICASONE PROPIONATE 50 MCG/ACT NA SUSP Nasal Place 2 sprays into the nose daily. 16 g 2  . HYDROCHLOROTHIAZIDE 25 MG PO TABS Oral Take 25 mg by mouth daily.    Marland Kitchen METFORMIN HCL ER (MOD) 1000 MG PO TB24 Oral Take 1,000 mg by mouth daily with breakfast.    . PREDNISONE 20 MG PO TABS Oral Take 60 mg by mouth daily.    . SULFAMETHOXAZOLE-TRIMETHOPRIM 800-160 MG PO TABS Oral Take 1 tablet by mouth every Monday, Wednesday, and Friday. 12 tablet 0    BP 134/97  Pulse 118  Temp 98 F (36.7 C) (Oral)  Resp 28  SpO2 92%  LMP 07/05/2012  Physical Exam Alert pleasant morbidly obese African American female, no icterus no pallor Nasal turbinates enlarged, tympanic membranes bilaterally seem to be red but have no air bubbles or air-fluid levels.   Audible wheeze heard away from patient without stethoscope, slightly  tachycardic, no tactile vocal resonance and tactile vocal fremitus Large pendulous breasts Abdomen obese nontender nondistended No CVA tenderness Moving all 4 limbs equally    ED Course  Procedures (including critical care time) The patient was given the following meds in the [facility]:   Medications  levalbuterol (XOPENEX) 0.31 MG/3ML nebulizer solution (not administered)  methylPREDNISolone sodium succinate (SOLU-MEDROL) 125 mg/2 mL injection 80 mg (not administered)  ipratropium-albuterol (DUONEB) 0.5-2.5 (3) MG/3ML nebulizer solution 3 mL (3 mL Nebulization Given 07/25/12 1132)     And had the following response:  She did better and felt better but still had some tightness in her chest-chest x-ray two-view still pending however  given medical complexity as well as the fact that she is relatively immunocompromised and on chronic steroids, she potentially will warrant at least observation and inpatient pending chest x-ray review. Patient her chest x-ray is negative patient will be sent home on a higher dose of chronic prednisone 100 mg daily until she can see a primary care physician-she will also be been given reduction of her chronic suppressive urinary tract infection medications subsequent to a urinalysis Patient will be given 1 g of Rocephin IM stat and will be sent to the emergency room for further workup and recommendations   Labs Reviewed - No data to display No results found.   No diagnosis found.    MDM   Update-Patient state she feels much better and wishes to pick up her grandchild-she states she understands if she feels worsening SO, Pelvic pressure to return to ED CXr shows no Acute process Will d/c pt home on Prednisone burst 80 mg for 10 days and Bactrim for Rx and then prohphylaxis Will give a work-note as well Patient voiced understanding of POC       Rhetta Mura, MD 07/25/12 1213  Rhetta Mura, MD 07/25/12 1311

## 2012-07-25 NOTE — ED Notes (Signed)
Notified berkley , rn in ed that patient is not coming.  Patient and physician decided otherwise

## 2012-07-25 NOTE — ED Notes (Signed)
Clarified order for solumedrol.

## 2012-07-25 NOTE — ED Notes (Signed)
Patient reports she is out of antibiotic, usually takes on a m-w-f routine for maintenance secondary to history of uti.  Patient volunteers she needs to give specimen suspecting

## 2012-07-25 NOTE — ED Notes (Signed)
C/o sob onset yesterday, wheezing, coughing.  Coughing up yellow phlegm.  Pain in right side of torso, radiates to the front under right breast.  Reports feeling like it did when she had pneumonia.

## 2012-07-25 NOTE — ED Notes (Signed)
Patient asking many questions about purpose of medicines.  Patient reported physician returning to talk to her.

## 2012-09-22 ENCOUNTER — Emergency Department (HOSPITAL_COMMUNITY)
Admission: EM | Admit: 2012-09-22 | Discharge: 2012-09-22 | Disposition: A | Discharge status: Home / Self Care | Payer: Self-pay | Attending: Emergency Medicine | Admitting: Emergency Medicine

## 2012-09-22 ENCOUNTER — Encounter (HOSPITAL_COMMUNITY): Payer: Self-pay | Admitting: Emergency Medicine

## 2012-09-22 DIAGNOSIS — J020 Streptococcal pharyngitis: Secondary | ICD-10-CM | POA: Insufficient documentation

## 2012-09-22 DIAGNOSIS — I10 Essential (primary) hypertension: Secondary | ICD-10-CM | POA: Insufficient documentation

## 2012-09-22 DIAGNOSIS — R599 Enlarged lymph nodes, unspecified: Secondary | ICD-10-CM | POA: Insufficient documentation

## 2012-09-22 DIAGNOSIS — E119 Type 2 diabetes mellitus without complications: Secondary | ICD-10-CM | POA: Insufficient documentation

## 2012-09-22 DIAGNOSIS — M542 Cervicalgia: Secondary | ICD-10-CM | POA: Insufficient documentation

## 2012-09-22 DIAGNOSIS — Z79899 Other long term (current) drug therapy: Secondary | ICD-10-CM | POA: Insufficient documentation

## 2012-09-22 DIAGNOSIS — J45909 Unspecified asthma, uncomplicated: Secondary | ICD-10-CM | POA: Insufficient documentation

## 2012-09-22 DIAGNOSIS — R509 Fever, unspecified: Secondary | ICD-10-CM | POA: Insufficient documentation

## 2012-09-22 LAB — BASIC METABOLIC PANEL
CO2: 22 mEq/L (ref 19–32)
Calcium: 9.1 mg/dL (ref 8.4–10.5)
Glucose, Bld: 237 mg/dL — ABNORMAL HIGH (ref 70–99)
Potassium: 3.6 mEq/L (ref 3.5–5.1)
Sodium: 133 mEq/L — ABNORMAL LOW (ref 135–145)

## 2012-09-22 LAB — RAPID STREP SCREEN (MED CTR MEBANE ONLY): Streptococcus, Group A Screen (Direct): POSITIVE — AB

## 2012-09-22 LAB — CBC WITH DIFFERENTIAL/PLATELET
Basophils Absolute: 0.1 10*3/uL (ref 0.0–0.1)
Eosinophils Relative: 2 % (ref 0–5)
Lymphocytes Relative: 46 % (ref 12–46)
Lymphs Abs: 4.2 10*3/uL — ABNORMAL HIGH (ref 0.7–4.0)
MCV: 85.7 fL (ref 78.0–100.0)
Neutro Abs: 4.2 10*3/uL (ref 1.7–7.7)
Neutrophils Relative %: 46 % (ref 43–77)
Platelets: 301 10*3/uL (ref 150–400)
RBC: 4.33 MIL/uL (ref 3.87–5.11)
RDW: 12 % (ref 11.5–15.5)
WBC: 9.1 10*3/uL (ref 4.0–10.5)

## 2012-09-22 LAB — MONONUCLEOSIS SCREEN: Mono Screen: NEGATIVE

## 2012-09-22 MED ORDER — AZITHROMYCIN 250 MG PO TABS
ORAL_TABLET | ORAL | Status: AC
  Filled 2012-09-22: qty 2

## 2012-09-22 MED ORDER — HYDROCODONE-ACETAMINOPHEN 7.5-500 MG/15ML PO SOLN: 15.0000 mL | Freq: Four times a day (QID) | ORAL | Status: DC | PRN

## 2012-09-22 MED ORDER — MORPHINE SULFATE 4 MG/ML IJ SOLN
4.0000 mg | Freq: Once | INTRAMUSCULAR | Status: AC
  Administered 2012-09-22: 4 mg via INTRAVENOUS
  Filled 2012-09-22: qty 1

## 2012-09-22 MED ORDER — AZITHROMYCIN 250 MG PO TABS
500.0000 mg | ORAL_TABLET | Freq: Once | ORAL | Status: AC
  Administered 2012-09-22: 500 mg via ORAL

## 2012-09-22 MED ORDER — AZITHROMYCIN 250 MG PO TABS: 250.0000 mg | ORAL_TABLET | Freq: Every day | ORAL | Status: DC

## 2012-09-22 MED ORDER — ALBUTEROL SULFATE HFA 108 (90 BASE) MCG/ACT IN AERS
2.0000 | INHALATION_SPRAY | RESPIRATORY_TRACT | Status: DC
  Administered 2012-09-22: 2 via RESPIRATORY_TRACT
  Filled 2012-09-22: qty 6.7

## 2012-09-22 MED ORDER — SODIUM CHLORIDE 0.9 % IV BOLUS (SEPSIS)
1000.0000 mL | Freq: Once | INTRAVENOUS | Status: AC
  Administered 2012-09-22: 1000 mL via INTRAVENOUS

## 2012-09-22 NOTE — ED Notes (Signed)
Bed:WA25<BR> Expected date:<BR> Expected time:<BR> Means of arrival:<BR> Comments:<BR> SOB

## 2012-09-22 NOTE — ED Provider Notes (Signed)
History     CSN: [id number]  Arrival date & time 09/22/12  0447   First MD Initiated Contact with Patient 09/22/12 0507      Chief Complaint  Patient presents with  . Sore Throat    (Consider location/radiation/quality/duration/timing/severity/associated sxs/prior treatment) HPI PT with history of HTN, DM reports about a week of progressively worsening sore throat, worse on the left, associated with fever but no cough. Pain has been severe, aching, worse with swallowing. No sick contacts, symptoms not improved with OTC meds.  Past Medical History  Diagnosis Date  . Diabetes mellitus   . Hypertension   . Urinary bladder disorder   . Asthma   . Emphysema     Past Surgical History  Procedure Date  . Cesarean section     No family history on file.  History  Substance Use Topics  . Smoking status: Former Games developer  . Smokeless tobacco: Not on file  . Alcohol Use: No    OB History    Grav Para Term Preterm Abortions TAB SAB Ect Mult Living                  Review of Systems All other systems reviewed and are negative except as noted in HPI.   Allergies  Penicillins  Home Medications   Current Outpatient Rx  Name Route Sig Dispense Refill  . ALBUTEROL SULFATE HFA 108 (90 BASE) MCG/ACT IN AERS Inhalation Inhale 2 puffs into the lungs every 6 (six) hours as needed. Sob    . ALBUTEROL SULFATE (5 MG/ML) 0.5% IN NEBU Nebulization Take 2.5 mg by nebulization every 6 (six) hours as needed. Sob    . BROMPHENIRAMINE-PSEUDOEPH 1-15 MG/5ML PO ELIX Oral Take 10 mLs by mouth 2 (two) times daily as needed. Cold symptoms    . FLUTICASONE PROPIONATE 50 MCG/ACT NA SUSP Nasal Place 2 sprays into the nose daily. 16 g 2  . HYDROCHLOROTHIAZIDE 25 MG PO TABS Oral Take 25 mg by mouth daily.    . IBUPROFEN 200 MG PO TABS Oral Take 400 mg by mouth every 6 (six) hours as needed. Pain    . IPRATROPIUM BROMIDE 0.02 % IN SOLN Nebulization Take 500 mcg by nebulization 4 (four) times daily.      Marland Kitchen METFORMIN HCL 500 MG PO TABS Oral Take 500 mg by mouth 2 (two) times daily with a meal.    . PREDNISONE 20 MG PO TABS Oral Take 60 mg by mouth daily.    . SULFAMETHOXAZOLE-TRIMETHOPRIM 800-160 MG PO TABS Oral Take 1 tablet by mouth every Monday, Wednesday, and Friday. 12 tablet 0    BP 157/97  Pulse 108  Temp 98.8 F (37.1 C) (Oral)  Resp 20  Ht 5\' 4"  (1.626 m)  Wt 274 lb (124.286 kg)  BMI 47.03 kg/m2  SpO2 95%  LMP 09/05/2012  Physical Exam  Nursing note and vitals reviewed. Constitutional: She is oriented to person, place, and time. She appears well-developed and well-nourished.  HENT:  Head: Normocephalic and atraumatic.  Mouth/Throat: No oropharyngeal exudate.  Eyes: EOM are normal. Pupils are equal, round, and reactive to light.  Neck: Normal range of motion. Neck supple.       Lymphadenopathy L>R, tender to palpation  Cardiovascular: Normal rate, normal heart sounds and intact distal pulses.   Pulmonary/Chest: Effort normal and breath sounds normal.  Abdominal: Bowel sounds are normal. She exhibits no distension. There is no tenderness.  Musculoskeletal: Normal range of motion. She exhibits no edema and  no tenderness.  Lymphadenopathy:    She has cervical adenopathy.  Neurological: She is alert and oriented to person, place, and time. She has normal strength. No cranial nerve deficit or sensory deficit.  Skin: Skin is warm and dry. No rash noted.  Psychiatric: She has a normal mood and affect.    ED Course  Procedures (including critical care time)  Labs Reviewed  CBC WITH DIFFERENTIAL - Abnormal; Notable for the following:    Lymphs Abs 4.2 (*)     All other components within normal limits  BASIC METABOLIC PANEL  RAPID STREP SCREEN  MONONUCLEOSIS SCREEN  Labs have not crossed over, but hard copy sent from Lab shows mild hyperglycemia, positive Strep and neg Monospot.   No results found.   No diagnosis found.    MDM  Treat for strep pharyngitis. No  concern for abscess.         Charles B. Bernette Mayers, MD 09/22/12 [phone]

## 2012-09-22 NOTE — ED Notes (Signed)
Per EMS pt transported from home with c/o difficulty swallowing x 1 week. Pt taking OTC cold medication without relief. Pt also c/o pain to L side of neck. Denies SHOB, LS clear

## 2012-11-05 ENCOUNTER — Emergency Department (INDEPENDENT_AMBULATORY_CARE_PROVIDER_SITE_OTHER)
Admission: EM | Admit: 2012-11-05 | Discharge: 2012-11-05 | Disposition: A | Discharge status: Home / Self Care | Payer: Self-pay | Source: Home / Self Care | Attending: Emergency Medicine | Admitting: Emergency Medicine

## 2012-11-05 ENCOUNTER — Encounter (HOSPITAL_COMMUNITY): Payer: Self-pay | Admitting: Emergency Medicine

## 2012-11-05 DIAGNOSIS — L309 Dermatitis, unspecified: Secondary | ICD-10-CM

## 2012-11-05 DIAGNOSIS — B86 Scabies: Secondary | ICD-10-CM

## 2012-11-05 LAB — POCT PREGNANCY, URINE: Preg Test, Ur: NEGATIVE

## 2012-11-05 MED ORDER — TRAMADOL HCL 50 MG PO TABS: 100.0000 mg | ORAL_TABLET | Freq: Three times a day (TID) | ORAL | Status: DC | PRN

## 2012-11-05 MED ORDER — PERMETHRIN 5 % EX CREA: TOPICAL_CREAM | CUTANEOUS | Status: DC

## 2012-11-05 MED ORDER — HYDROCORTISONE 1 % EX CREA: TOPICAL_CREAM | CUTANEOUS | Status: DC

## 2012-11-05 MED ORDER — TRIAMCINOLONE ACETONIDE 0.1 % EX CREA: TOPICAL_CREAM | Freq: Three times a day (TID) | CUTANEOUS | Status: DC

## 2012-11-05 NOTE — ED Provider Notes (Signed)
Chief Complaint  Patient presents with  . Rash    History of Present Illness:   The patient is a 41 year old female who comes in with her boyfriend who has a similar rash, and she's only diagnosed herself as having scabies. This rash has been going on for about a week. It involves the face, back, and the legs with generalized itching. She has a history of asthma and alpha 1 antitrypsin deficiency. She does not have a specific history of eczema. She is on prednisone 60 mg a day as well as albuterol and Spiriva. She has a history of high blood pressure and diabetes and is on HCTZ, metformin, and Bactrim for chronic bronchitis.  Review of Systems:  Other than noted above, the patient denies any of the following symptoms: Systemic:  No fever, chills, sweats, weight loss, or fatigue. ENT:  No nasal congestion, rhinorrhea, sore throat, swelling of lips, tongue or throat. Resp:  No cough, wheezing, or shortness of breath. Skin:  No rash, itching, nodules, or suspicious lesions.  PMFSH:  Past medical history, family history, social history, meds, and allergies were reviewed.  Physical Exam:   Vital signs:  BP 126/85  Pulse 99  Temp 97.9 F (36.6 C) (Oral)  Resp 18  SpO2 96%  LMP 09/20/2012 Gen:  Alert, oriented, in no distress. ENT:  Pharynx clear, no intraoral lesions, moist mucous membranes. Lungs:  Clear to auscultation. Skin:  She appears to have 2 different kinds of rash. 1 scattered small maculopapules that are located on the arms and ankles. She also has an eczema times rash on the back of her neck and her back.  Assessment:  The primary encounter diagnosis was Scabies. A diagnosis of Eczema was also pertinent to this visit.  Plan:   1.  The following meds were prescribed:   New Prescriptions   HYDROCORTISONE CREAM 1 %    Apply to rash on face TID.   HYDROCORTISONE CREAM 1 %    Apply to rash on face TID   PERMETHRIN (ELIMITE) 5 % CREAM    Apply head to toe at bedtime.  Leave on for  8 hours.  Scrub off next morning.  Repeat same procedure in 1 week.   PERMETHRIN (ELIMITE) 5 % CREAM    Apply head to toe at bedtime.  Leave on for 8 hours.  Scrub off next morning.  Repeat procedure in 1 week.   TRAMADOL (ULTRAM) 50 MG TABLET    Take 2 tablets (100 mg total) by mouth every 8 (eight) hours as needed for pain.   TRAMADOL (ULTRAM) 50 MG TABLET    Take 2 tablets (100 mg total) by mouth every 8 (eight) hours as needed for pain.   TRIAMCINOLONE CREAM (KENALOG) 0.1 %    Apply topically 3 (three) times daily. Apply to rash on body 3 times daily.   TRIAMCINOLONE CREAM (KENALOG) 0.1 %    Apply topically 3 (three) times daily. Apply to rash on body TID   2.  The patient was instructed in symptomatic care and handouts were given. 3.  The patient was told to return if becoming worse in any way, if no better in 3 or 4 days, and given some red flag symptoms that would indicate earlier return.     Reuben Likes, MD 11/05/12 2129

## 2012-11-05 NOTE — ED Notes (Signed)
Pt c/o poss scabies x1 week... Rash on face, arms, legs, back.... Boyfriend having similar sx x1 month and being seen as well... Denies: fevers, vomiting, nauseas, diarrhea... Pt is alert w/no signs of distress.

## 2012-11-24 ENCOUNTER — Encounter (HOSPITAL_COMMUNITY): Payer: Self-pay | Admitting: Emergency Medicine

## 2012-11-24 ENCOUNTER — Emergency Department (INDEPENDENT_AMBULATORY_CARE_PROVIDER_SITE_OTHER)
Admission: EM | Admit: 2012-11-24 | Discharge: 2012-11-24 | Disposition: A | Discharge status: Home / Self Care | Payer: Self-pay | Source: Home / Self Care | Attending: Family Medicine | Admitting: Family Medicine

## 2012-11-24 ENCOUNTER — Emergency Department (INDEPENDENT_AMBULATORY_CARE_PROVIDER_SITE_OTHER): Payer: Self-pay

## 2012-11-24 DIAGNOSIS — J449 Chronic obstructive pulmonary disease, unspecified: Secondary | ICD-10-CM

## 2012-11-24 MED ORDER — IPRATROPIUM BROMIDE 0.02 % IN SOLN
0.5000 mg | Freq: Once | RESPIRATORY_TRACT | Status: AC
  Administered 2012-11-24: 0.5 mg via RESPIRATORY_TRACT

## 2012-11-24 MED ORDER — ALBUTEROL SULFATE (5 MG/ML) 0.5% IN NEBU
INHALATION_SOLUTION | RESPIRATORY_TRACT | Status: AC
  Filled 2012-11-24: qty 1

## 2012-11-24 MED ORDER — PREDNISONE 20 MG PO TABS: ORAL_TABLET | ORAL | Status: DC

## 2012-11-24 MED ORDER — ALBUTEROL SULFATE (5 MG/ML) 0.5% IN NEBU
5.0000 mg | INHALATION_SOLUTION | Freq: Once | RESPIRATORY_TRACT | Status: AC
  Administered 2012-11-24: 5 mg via RESPIRATORY_TRACT

## 2012-11-24 MED ORDER — HYDROCHLOROTHIAZIDE 25 MG PO TABS: 25.0000 mg | ORAL_TABLET | Freq: Every day | ORAL | Status: DC

## 2012-11-24 MED ORDER — SULFAMETHOXAZOLE-TRIMETHOPRIM 800-160 MG PO TABS: 1.0000 | ORAL_TABLET | ORAL | Status: DC

## 2012-11-24 MED ORDER — METHYLPREDNISOLONE SODIUM SUCC 125 MG IJ SOLR
125.0000 mg | Freq: Once | INTRAMUSCULAR | Status: AC
  Administered 2012-11-24: 125 mg via INTRAMUSCULAR

## 2012-11-24 MED ORDER — METHYLPREDNISOLONE SODIUM SUCC 125 MG IJ SOLR
INTRAMUSCULAR | Status: AC
  Filled 2012-11-24: qty 2

## 2012-11-24 NOTE — ED Notes (Signed)
Pt c/o asthma problems x 3 days pt is out of medication. Pt also c/o nasal and chest congestion. Productive cough with yellow/green sputum and a fever on Wednesday. Pt denies n/v/d.

## 2012-11-25 NOTE — ED Provider Notes (Signed)
History     CSN: [id number]  Arrival date & time 11/24/12  1030   First MD Initiated Contact with Patient 11/24/12 1142      Chief Complaint  Patient presents with  . Asthma  . URI    cong. productive cough with yellow sputum, fever yesterday    (Consider location/radiation/quality/duration/timing/severity/associated sxs/prior treatment) HPI Comments: 41 year old female with complex medical history including diabetes, hypertension and COPD with alpha-1 antitrypsin deficiency. Comes complaining of nasal congestion and clear rhinorrhea,  productive cough and wheezing  for the last 3 days.  patient reports that she has been on prednisone since she was in her 78s and also takes Septra a regular basis. She has ran out of her medications including prednisone, Septra and hydrochlorothiazide. She has [facility] and does not have an appointment until March. Reports subjective fever 2 days ago. Denies current chest pain. No abdominal pain, nausea vomiting or diarrhea. 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 Diabetes mellitus   . Hypertension   . Urinary bladder disorder   . Asthma   . Emphysema     Past Surgical History  Procedure Date  . Cesarean section     History reviewed. No pertinent family history.  History  Substance Use Topics  . Smoking status: Former Games developer  . Smokeless tobacco: Not on file  . Alcohol Use: No    OB History    Grav Para Term Preterm Abortions TAB SAB Ect Mult Living                  Review of Systems  Constitutional: Negative for chills, diaphoresis and appetite change.  HENT: Positive for congestion and rhinorrhea. Negative for sore throat.   Eyes: Negative for discharge.  Respiratory: Positive for cough, chest tightness and wheezing.   Gastrointestinal: Negative for nausea, vomiting, abdominal pain and diarrhea.   Musculoskeletal: Negative for myalgias and arthralgias.  Skin: Negative for rash.  Neurological: Negative for dizziness and headaches.  All other systems reviewed and are negative.    Allergies  Penicillins  Home Medications   Current Outpatient Rx  Name  Route  Sig  Dispense  Refill  . ALBUTEROL SULFATE HFA 108 (90 BASE) MCG/ACT IN AERS   Inhalation   Inhale 2 puffs into the lungs every 6 (six) hours as needed. Sob         . ALBUTEROL SULFATE (5 MG/ML) 0.5% IN NEBU   Nebulization   Take 2.5 mg by nebulization every 6 (six) hours as needed. Sob         . BROMPHENIRAMINE-PSEUDOEPH 1-15 MG/5ML PO ELIX   Oral   Take 10 mLs by mouth 2 (two) times daily as needed. Cold symptoms         . FLUTICASONE PROPIONATE 50 MCG/ACT NA SUSP   Nasal   Place 2 sprays into the nose daily.   16 g   2   . HYDROCHLOROTHIAZIDE 25 MG PO TABS   Oral   Take 1 tablet (25 mg total) by mouth daily.   30 tablet   0   . HYDROCODONE-ACETAMINOPHEN 7.5-500 MG/15ML PO SOLN   Oral   Take 15 mLs by mouth every 6 (six) hours as needed for pain.   120 mL   0   . HYDROCORTISONE 1 % EX CREA      Apply  to rash on face TID   454 g   0   . IBUPROFEN 200 MG PO TABS   Oral   Take 400 mg by mouth every 6 (six) hours as needed. Pain         . IPRATROPIUM BROMIDE 0.02 % IN SOLN   Nebulization   Take 500 mcg by nebulization 4 (four) times daily.         Marland Kitchen METFORMIN HCL 500 MG PO TABS   Oral   Take 500 mg by mouth 2 (two) times daily with a meal.         . PREDNISONE 20 MG PO TABS      3 tab by mouth daily   30 tablet   0   . SULFAMETHOXAZOLE-TRIMETHOPRIM 800-160 MG PO TABS   Oral   Take 1 tablet by mouth every Monday, Wednesday, and Friday.   12 tablet   1   . TRAMADOL HCL 50 MG PO TABS   Oral   Take 2 tablets (100 mg total) by mouth every 8 (eight) hours as needed for pain.   30 tablet   0   . TRAMADOL HCL 50 MG PO TABS   Oral   Take 2 tablets (100 mg total) by mouth  every 8 (eight) hours as needed for pain.   30 tablet   0   . TRIAMCINOLONE ACETONIDE 0.1 % EX CREA   Topical   Apply topically 3 (three) times daily. Apply to rash on body TID   454 g   0     BP 145/100  Pulse 104  Temp 98 F (36.7 C) (Oral)  Resp 20  SpO2 95%  LMP 11/12/2012  Physical Exam  Nursing note and vitals reviewed. Constitutional: She is oriented to person, place, and time. She appears well-developed and well-nourished. No distress.  HENT:  Head: Normocephalic and atraumatic.       Nasal Congestion with erythema and swelling of nasal turbinates, clear rhinorrhea. Significant pharyngeal erythema no exudates. No uvula deviation. No trismus. TM's with increased vascular markings and some dullness bilaterally no swelling or bulging  Moon face  Eyes: Conjunctivae normal are normal. Right eye exhibits no discharge. Left eye exhibits no discharge. No scleral icterus.  Neck: Neck supple. No JVD present. No thyromegaly present.  Cardiovascular: Normal rate, regular rhythm and normal heart sounds.  Exam reveals no gallop and no friction rub.   No murmur heard.      No low extremity edema  Pulmonary/Chest:       Prolonged expiration with bilateral expiratory rhonchi. No orthopnea or tachypnea. No retractions. No Rales  Lymphadenopathy:    She has no cervical adenopathy.  Neurological: She is alert and oriented to person, place, and time.  Skin: No rash noted. She is not diaphoretic.    ED Course  Procedures (including critical care time)  Labs Reviewed - No data to display Dg Chest 2 View  11/24/2012  *RADIOLOGY REPORT*  Clinical Data: COPD with productive cough  CHEST - 2 VIEW  Comparison: July 25, 2012.  Findings: Lungs clear.  Heart size and pulmonary vascularity are normal.  No adenopathy.  No bone lesions.  IMPRESSION: No edema or consolidation.   Original Report Authenticated By: Bretta Bang, M.D.      1. COPD (chronic obstructive pulmonary disease)        MDM  Impress upper viral respiratory infection triggering emphysema symptoms also exacerbated by being out of her chronic steroid dose. Clear  lungs on x-rays. Patient was treated with albuterol/Atrovent neb x 1, and Solu-Medrol 125 mg IM x1. Lungs were clear prior to discharge. Refilled hydrochlorothiazide, prednisone 60 mg for 10 days with one refill and Septra as previously prescribed. I explained to patient that she needs to establish care with a primary care provider where she can be followed consistently and as needed. 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 I explained to her that it's is not safe to stop steroids abruptly when taking them chronically. She will contact the [facility] clinic to schedule an earlier appointment to have her medications monitored and refilled I also provided her with the adult clinic number at [place] if needed. Asked to go to the emergency department if worsening symptoms despite following treatment.      Sharin Grave, MD 11/25/12 2257

## 2013-01-11 NOTE — ED Notes (Signed)
Pt called and requested refill on prednisone. Spoke with dr. Alfonse Ras - refilled called in for 20 days only - pt encouraged to keep appt on feb 20th with [facility]

## 2013-01-25 ENCOUNTER — Emergency Department (HOSPITAL_COMMUNITY)
Admission: EM | Admit: 2013-01-25 | Discharge: 2013-01-25 | Disposition: A | Discharge status: Home / Self Care | Payer: Self-pay | Source: Home / Self Care | Attending: Family Medicine | Admitting: Family Medicine

## 2013-01-25 ENCOUNTER — Encounter (HOSPITAL_COMMUNITY): Payer: Self-pay

## 2013-01-25 DIAGNOSIS — I10 Essential (primary) hypertension: Secondary | ICD-10-CM

## 2013-01-25 DIAGNOSIS — E119 Type 2 diabetes mellitus without complications: Secondary | ICD-10-CM

## 2013-01-25 DIAGNOSIS — E8801 Alpha-1-antitrypsin deficiency: Secondary | ICD-10-CM

## 2013-01-25 DIAGNOSIS — J449 Chronic obstructive pulmonary disease, unspecified: Secondary | ICD-10-CM

## 2013-01-25 DIAGNOSIS — E1169 Type 2 diabetes mellitus with other specified complication: Secondary | ICD-10-CM

## 2013-01-25 DIAGNOSIS — E669 Obesity, unspecified: Secondary | ICD-10-CM

## 2013-01-25 DIAGNOSIS — J45909 Unspecified asthma, uncomplicated: Secondary | ICD-10-CM

## 2013-01-25 LAB — LIPID PANEL
Cholesterol: 215 mg/dL — ABNORMAL HIGH (ref 0–200)
HDL: 87 mg/dL (ref >39)
Total CHOL/HDL Ratio: 2.5 RATIO
Triglycerides: 380 mg/dL — ABNORMAL HIGH (ref <150)

## 2013-01-25 LAB — COMPREHENSIVE METABOLIC PANEL
AST: 17 U/L (ref 0–37)
Albumin: 3.2 g/dL — ABNORMAL LOW (ref 3.5–5.2)
BUN: 4 mg/dL — ABNORMAL LOW (ref 6–23)
Calcium: 9.8 mg/dL (ref 8.4–10.5)
Creatinine, Ser: 0.49 mg/dL — ABNORMAL LOW (ref 0.50–1.10)
Total Bilirubin: 0.4 mg/dL (ref 0.3–1.2)
Total Protein: 7.1 g/dL (ref 6.0–8.3)

## 2013-01-25 LAB — TSH: TSH: 1.394 u[IU]/mL (ref 0.350–4.500)

## 2013-01-25 MED ORDER — METHYLPREDNISOLONE SODIUM SUCC 125 MG IJ SOLR
125.0000 mg | Freq: Once | INTRAMUSCULAR | Status: AC
  Administered 2013-01-25: 125 mg via INTRAMUSCULAR

## 2013-01-25 MED ORDER — METHYLPREDNISOLONE SODIUM SUCC 125 MG IJ SOLR
INTRAMUSCULAR | Status: AC
  Filled 2013-01-25: qty 2

## 2013-01-25 MED ORDER — ALBUTEROL SULFATE (5 MG/ML) 0.5% IN NEBU
2.5000 mg | INHALATION_SOLUTION | Freq: Once | RESPIRATORY_TRACT | Status: AC
  Administered 2013-01-25: 2.5 mg via RESPIRATORY_TRACT

## 2013-01-25 MED ORDER — IPRATROPIUM BROMIDE 0.02 % IN SOLN
0.5000 mg | Freq: Once | RESPIRATORY_TRACT | Status: AC
  Administered 2013-01-25: 0.5 mg via RESPIRATORY_TRACT

## 2013-01-25 MED ORDER — METFORMIN HCL ER 500 MG PO TB24: 1000.0000 mg | ORAL_TABLET | Freq: Two times a day (BID) | ORAL | Status: DC

## 2013-01-25 MED ORDER — ALBUTEROL SULFATE (5 MG/ML) 0.5% IN NEBU
INHALATION_SOLUTION | RESPIRATORY_TRACT | Status: AC
  Filled 2013-01-25: qty 0.5

## 2013-01-25 MED ORDER — PREDNISONE 20 MG PO TABS: ORAL_TABLET | ORAL | Status: DC

## 2013-01-25 MED ORDER — HYDROCHLOROTHIAZIDE 25 MG PO TABS: 25.0000 mg | ORAL_TABLET | Freq: Every day | ORAL | Status: DC

## 2013-01-25 MED ORDER — ALBUTEROL SULFATE HFA 108 (90 BASE) MCG/ACT IN AERS: 2.0000 | INHALATION_SPRAY | Freq: Four times a day (QID) | RESPIRATORY_TRACT | Status: DC | PRN

## 2013-01-25 NOTE — ED Provider Notes (Signed)
History    CSN: [id number]  Arrival date & time 01/25/13  1644  First MD Initiated Contact with Patient 01/25/13 1704     Chief Complaint  Patient presents with  . Asthma   HPI This patient is presenting today for evaluation of asthma alpha-1 antitrypsin deficiency with a history of COPD.  She reports that she's been on chronic steroids for over 20 years.  She reports that she had been taking 120 mg of prednisone daily that has been decreased over time to 60 mg daily.  She has not seen a pulmonologist in several years.  She had been going to the health service clinic and they have been providing her refills on her prednisone and medications for asthma.  She reports that she's been without her prednisone for several days and she is wheezing and having shortness of breath.  She's coughing frequently.  She reports that she does have a nebulizer at home and she's been using that.  She also has diabetes mellitus that is related to chronic prednisone use.  She reports her blood sugars have been much better controlled over the past several days that she's not been taking the prednisone.  Her blood sugar this morning fasting was 135.  Normally her blood glucose is greater than 200 every morning fasting.  She takes metformin 500 mg twice a day.  She reports that she is taking glipizide in the past but it did not help her glycemic control very well.  Past Medical History  Diagnosis Date  . Diabetes mellitus   . Hypertension   . Urinary bladder disorder   . Asthma   . Emphysema     Past Surgical History  Procedure Date  . Cesarean section     No family history on file.  History  Substance Use Topics  . Smoking status: Former Games developer  . Smokeless tobacco: Not on file  . Alcohol Use: No    OB History    Grav Para Term Preterm Abortions TAB SAB Ect Mult Living                 Review of Systems  Respiratory: Positive for cough, chest tightness, shortness of breath and wheezing.    Genitourinary: Positive for urgency and frequency.  All other systems reviewed and are negative.    Allergies  Penicillins  Home Medications   Current Outpatient Rx  Name  Route  Sig  Dispense  Refill  . ALBUTEROL SULFATE HFA 108 (90 BASE) MCG/ACT IN AERS   Inhalation   Inhale 2 puffs into the lungs every 6 (six) hours as needed for wheezing or shortness of breath. Sob   18 g   8   . ALBUTEROL SULFATE (5 MG/ML) 0.5% IN NEBU   Nebulization   Take 2.5 mg by nebulization every 6 (six) hours as needed. Sob         . HYDROCHLOROTHIAZIDE 25 MG PO TABS   Oral   Take 1 tablet (25 mg total) by mouth daily.   30 tablet   4   . HYDROCODONE-ACETAMINOPHEN 7.5-500 MG/15ML PO SOLN   Oral   Take 15 mLs by mouth every 6 (six) hours as needed for pain.   120 mL   0   . HYDROCORTISONE 1 % EX CREA      Apply to rash on face TID   454 g   0   . IBUPROFEN 200 MG PO TABS   Oral   Take 400 mg by  mouth every 6 (six) hours as needed. Pain         . IPRATROPIUM BROMIDE 0.02 % IN SOLN   Nebulization   Take 500 mcg by nebulization 4 (four) times daily.         Marland Kitchen METFORMIN HCL ER 500 MG PO TB24   Oral   Take 2 tablets (1,000 mg total) by mouth 2 (two) times daily with a meal.   120 tablet   3   . PREDNISONE 20 MG PO TABS      3 tab by mouth daily   90 tablet   2   . SULFAMETHOXAZOLE-TRIMETHOPRIM 800-160 MG PO TABS   Oral   Take 1 tablet by mouth every Monday, Wednesday, and Friday.   12 tablet   1   . TRAMADOL HCL 50 MG PO TABS   Oral   Take 2 tablets (100 mg total) by mouth every 8 (eight) hours as needed for pain.   30 tablet   0   . TRAMADOL HCL 50 MG PO TABS   Oral   Take 2 tablets (100 mg total) by mouth every 8 (eight) hours as needed for pain.   30 tablet   0   . TRIAMCINOLONE ACETONIDE 0.1 % EX CREA   Topical   Apply topically 3 (three) times daily. Apply to rash on body TID   454 g   0     BP 133/101  Pulse 109  Temp 98.4 F (36.9 C) (Oral)   Resp 19  SpO2 94%  Physical Exam  Nursing note and vitals reviewed. Constitutional: She is oriented to person, place, and time. She appears well-developed and well-nourished.  HENT:  Head: Normocephalic and atraumatic.  Right Ear: External ear normal.  Left Ear: External ear normal.  Eyes: Conjunctivae normal and EOM are normal. Pupils are equal, round, and reactive to light.  Neck: Normal range of motion. Neck supple.  Pulmonary/Chest: Effort normal. No respiratory distress. She has wheezes.  Abdominal: Soft. Bowel sounds are normal. She exhibits no distension and no mass. There is no tenderness. There is no rebound and no guarding.  Musculoskeletal: Normal range of motion.  Neurological: She is alert and oriented to person, place, and time. She has normal reflexes.  Skin: Skin is warm and dry.  Psychiatric: She has a normal mood and affect. Her behavior is normal. Judgment and thought content normal.    ED Course  Procedures (including critical care time)   Labs Reviewed  COMPREHENSIVE METABOLIC PANEL  HEMOGLOBIN A1C  LIPID PANEL  TSH   No results found.   1. Alpha-1-antitrypsin deficiency   2. Asthma   3. COPD (chronic obstructive pulmonary disease)   4. Hypertension   5. Diabetes mellitus type 2 in obese     MDM  RECOMMENDATIONS / PLAN Refilled her prednisone 60 mg po daily Solumedrol 125 mg IM given in office Duoneb treatment given and wheezing improved dramatically after one treatment Refer to qualified pulmonologist for evaluation and management recommendations Discussed with patient about weight control Intensified diabetes therapy by increasing metformin ER to 500mg  - 2 tabs po BiD AC Continue close monitoring of BS at home  FOLLOW UP 2 months or sooner if needed  The patient was given clear instructions to go to ER or return to medical center if symptoms don't improve, worsen or new problems develop.  The patient verbalized understanding.  The patient  was told to call to get lab results if they haven't heard  anything in the next week.            Cleora Fleet, MD 01/25/13 2011

## 2013-01-25 NOTE — ED Notes (Signed)
Patient states has been out of her prednisone for 4 days .  Wheezing-did a breathing treatment about 1 hour ago

## 2013-01-26 LAB — HEMOGLOBIN A1C
Hgb A1c MFr Bld: 9.5 % — ABNORMAL HIGH (ref <5.7)
Mean Plasma Glucose: 226 mg/dL — ABNORMAL HIGH (ref <117)

## 2013-01-26 NOTE — Progress Notes (Signed)
Quick Note:  Please notify patient that her blood sugar was elevated and her diabetes is out of control as evidenced by an A1c of 9.5%. Please institute the medication changes that we discussed in clinic and recheck labs again in 3 months. Check blood sugars regularly and bring to next office visit.   Rodney Langton, MD, CDE, FAAFP [facility] [place], [place]   ______

## 2013-01-27 ENCOUNTER — Telehealth (HOSPITAL_COMMUNITY): Payer: Self-pay

## 2013-01-27 NOTE — Telephone Encounter (Signed)
Message copied by Lestine Mount on Fri Jan 27, 2013  6:48 PM ------      Message from: Cleora Fleet      Created: Thu Jan 26, 2013  9:58 AM       Please notify patient that her blood sugar was elevated and her diabetes is out of control as evidenced by an A1c of 9.5%.  Please institute the medication changes that we discussed in clinic and recheck labs again in 3 months.  Check blood sugars regularly and bring to next office visit.              Rodney Langton, MD, CDE, FAAFP      [facility]      [facility]      [place], [place]

## 2013-05-30 ENCOUNTER — Telehealth: Payer: Self-pay | Admitting: Family Medicine

## 2013-05-30 ENCOUNTER — Other Ambulatory Visit: Payer: Self-pay | Admitting: Family Medicine

## 2013-05-30 MED ORDER — PREDNISONE 20 MG PO TABS: ORAL_TABLET | ORAL | Status: DC

## 2013-05-30 NOTE — Telephone Encounter (Signed)
Pt says she urgently needs predniSONE (DELTASONE) 20 MG tablet, refill. Pt says she can not wait until tomorrow's appt (05/30/13) at 5:15 because she has not had meds since Thurs (05/25/13).

## 2013-05-30 NOTE — Progress Notes (Signed)
Pt called requesting refill of prednisone.  Will give 5 days supply as pt is supposed to be seen in clinic tomorrow 6/11.   Pt did not follow up in 2 months as scheduled.   Rodney Langton, MD, CDE, FAAFP [facility] [place], [place]

## 2013-05-30 NOTE — Telephone Encounter (Signed)
Pt needs to be sure to follow up tomorrow as scheduled.  Will call in a couple of days of medication until she can be seen tomorrow.  Instruct pt to go to ER if getting worsening SOB or respiratory problems.    Rodney Langton, MD, CDE, FAAFP [facility] [place], [place]

## 2013-05-31 ENCOUNTER — Telehealth: Payer: Self-pay | Admitting: *Deleted

## 2013-05-31 ENCOUNTER — Ambulatory Visit: Payer: Self-pay | Attending: Family Medicine | Admitting: Family Medicine

## 2013-05-31 VITALS — BP 139/96 | HR 106 | Temp 98.9°F | Resp 15 | Wt 262.6 lb

## 2013-05-31 DIAGNOSIS — R062 Wheezing: Secondary | ICD-10-CM | POA: Insufficient documentation

## 2013-05-31 DIAGNOSIS — E8801 Alpha-1-antitrypsin deficiency: Secondary | ICD-10-CM

## 2013-05-31 DIAGNOSIS — R059 Cough, unspecified: Secondary | ICD-10-CM | POA: Insufficient documentation

## 2013-05-31 DIAGNOSIS — R05 Cough: Secondary | ICD-10-CM | POA: Insufficient documentation

## 2013-05-31 DIAGNOSIS — J45901 Unspecified asthma with (acute) exacerbation: Secondary | ICD-10-CM

## 2013-05-31 DIAGNOSIS — R0602 Shortness of breath: Secondary | ICD-10-CM | POA: Insufficient documentation

## 2013-05-31 DIAGNOSIS — I10 Essential (primary) hypertension: Secondary | ICD-10-CM

## 2013-05-31 LAB — COMPREHENSIVE METABOLIC PANEL
ALT: 11 U/L (ref 0–35)
AST: 9 U/L (ref 0–37)
Albumin: 3.4 g/dL — ABNORMAL LOW (ref 3.5–5.2)
BUN: 11 mg/dL (ref 6–23)
CO2: 29 mEq/L (ref 19–32)
Calcium: 8.9 mg/dL (ref 8.4–10.5)
Chloride: 99 mEq/L (ref 96–112)
Creat: 0.76 mg/dL (ref 0.50–1.10)
Potassium: 3.6 mEq/L (ref 3.5–5.3)

## 2013-05-31 MED ORDER — METHYLPREDNISOLONE SODIUM SUCC 125 MG IJ SOLR
125.0000 mg | Freq: Once | INTRAMUSCULAR | Status: AC
  Administered 2013-05-31: 125 mg via INTRAMUSCULAR

## 2013-05-31 MED ORDER — GLIMEPIRIDE 2 MG PO TABS: 2.0000 mg | ORAL_TABLET | Freq: Every day | ORAL | Status: DC

## 2013-05-31 MED ORDER — HYDROCHLOROTHIAZIDE 25 MG PO TABS: 25.0000 mg | ORAL_TABLET | Freq: Every day | ORAL | Status: DC

## 2013-05-31 MED ORDER — PREDNISONE 20 MG PO TABS: ORAL_TABLET | ORAL | Status: DC

## 2013-05-31 MED ORDER — ALBUTEROL SULFATE (2.5 MG/3ML) 0.083% IN NEBU
2.5000 mg | INHALATION_SOLUTION | Freq: Once | RESPIRATORY_TRACT | Status: AC
  Administered 2013-05-31: 2.5 mg via RESPIRATORY_TRACT

## 2013-05-31 NOTE — Progress Notes (Signed)
Patient has a history of asthma Has had a flair up that started 3 days ago Presents today with SOB and wheezing

## 2013-05-31 NOTE — Telephone Encounter (Signed)
05/31/13 Attempt to reach patient not available.  Patient has appointment schedule for today. 05/31/13 P[facility]

## 2013-05-31 NOTE — Progress Notes (Signed)
Patient ID: Carla Hicks, female   DOB: 06-08-1971, 42 y.o.   MRN: [id number]  CC: SOB  HPI: Pt has been out of her prednisone and having SOB and wheezing and cough for the past couple of days.  Pt never followed up with getting her [facility] card and getting seen by a pulmonologist.  She is still willing to see one now. She is using nebs at home. She has been on chronic prednisone for years.  She has back pain from having large breasts and her bra is cutting into her shoulders.   Allergies  Allergen Reactions  . Penicillins    Past Medical History  Diagnosis Date  . Diabetes mellitus   . Hypertension   . Urinary bladder disorder   . Asthma   . Emphysema    Current Outpatient Prescriptions on File Prior to Visit  Medication Sig Dispense Refill  . albuterol (PROVENTIL HFA;VENTOLIN HFA) 108 (90 BASE) MCG/ACT inhaler Inhale 2 puffs into the lungs every 6 (six) hours as needed for wheezing or shortness of breath. Sob  18 g  8  . albuterol (PROVENTIL) (5 MG/ML) 0.5% nebulizer solution Take 2.5 mg by nebulization every 6 (six) hours as needed. Sob      . HYDROcodone-acetaminophen (LORTAB) 7.5-500 MG/15ML solution Take 15 mLs by mouth every 6 (six) hours as needed for pain.  120 mL  0  . hydrocortisone cream 1 % Apply to rash on face TID  454 g  0  . ibuprofen (ADVIL,MOTRIN) 200 MG tablet Take 400 mg by mouth every 6 (six) hours as needed. Pain      . ipratropium (ATROVENT) 0.02 % nebulizer solution Take 500 mcg by nebulization 4 (four) times daily.      . metFORMIN (GLUCOPHAGE XR) 500 MG 24 hr tablet Take 2 tablets (1,000 mg total) by mouth 2 (two) times daily with a meal.  120 tablet  3  . sulfamethoxazole-trimethoprim (BACTRIM DS,SEPTRA DS) 800-160 MG per tablet Take 1 tablet by mouth every Monday, Wednesday, and Friday.  12 tablet  1  . traMADol (ULTRAM) 50 MG tablet Take 2 tablets (100 mg total) by mouth every 8 (eight) hours as needed for pain.  30 tablet  0  . traMADol (ULTRAM) 50 MG  tablet Take 2 tablets (100 mg total) by mouth every 8 (eight) hours as needed for pain.  30 tablet  0  . triamcinolone cream (KENALOG) 0.1 % Apply topically 3 (three) times daily. Apply to rash on body TID  454 g  0   No current facility-administered medications on file prior to visit.   No family history on file. History   Social History  . Marital Status: Legally Separated    Spouse Name: N/A    Number of Children: N/A  . Years of Education: N/A   Occupational History  . Not on file.   Social History Main Topics  . Smoking status: Former Games developer  . Smokeless tobacco: Not on file  . Alcohol Use: No  . Drug Use: No  . Sexually Active:    Other Topics Concern  . Not on file   Social History Narrative  . No narrative on file    Review of Systems  Constitutional: Negative for fever, chills, diaphoresis, activity change, appetite change and fatigue.  HENT: Negative for ear pain, nosebleeds, congestion, facial swelling, rhinorrhea, neck pain, neck stiffness and ear discharge.   Eyes: Negative for pain, discharge, redness, itching and visual disturbance.  Respiratory: Negative for  cough, choking, chest tightness, shortness of breath, wheezing and stridor.   Cardiovascular: Negative for chest pain, palpitations and leg swelling.  Gastrointestinal: Negative for abdominal distention.  Genitourinary: Negative for dysuria, urgency, frequency, hematuria, flank pain, decreased urine volume, difficulty urinating and dyspareunia.  Musculoskeletal: positive for back pain, Neg for joint swelling, arthralgias and gait problem.  Neurological: Negative for dizziness, tremors, seizures, syncope, facial asymmetry, speech difficulty, weakness, light-headedness, numbness and headaches.  Hematological: Negative for adenopathy. Does not bruise/bleed easily.  Psychiatric/Behavioral: Negative for hallucinations, behavioral problems, confusion, dysphoric mood, decreased concentration and agitation.     Objective:   Filed Vitals:   05/31/13 1745  BP: 139/96  Pulse: 106  Temp: 98.9 F (37.2 C)  Resp: 15    Physical Exam  Constitutional: Appears well-developed and well-nourished. No distress.  HENT: Normocephalic. External right and left ear normal. Oropharynx is clear and moist.  Eyes: Conjunctivae and EOM are normal. PERRLA, no scleral icterus.  Neck: Normal ROM. Neck supple. No JVD. No tracheal deviation. No thyromegaly.  CVS: RRR, S1/S2 +, no murmurs, no gallops, no carotid bruit.  Pulmonary: insp exp wheezes and rales heard  Abdominal: Soft. BS +,  no distension, tenderness, rebound or guarding.  Musculoskeletal: Normal range of motion. No edema and no tenderness.  Lymphadenopathy: No lymphadenopathy noted, cervical, inguinal. Neuro: Alert. Normal reflexes, muscle tone coordination. No cranial nerve deficit. Skin: Skin is warm and dry. No rash noted. Not diaphoretic. No erythema. No pallor.  Psychiatric: Normal mood and affect. Behavior, judgment, thought content normal.   Lab Results  Component Value Date   WBC 9.1 09/22/2012   HGB 13.3 09/22/2012   HCT 37.1 09/22/2012   MCV 85.7 09/22/2012   PLT 301 09/22/2012   Lab Results  Component Value Date   CREATININE 0.49* 01/25/2013   BUN 4* 01/25/2013   NA 139 01/25/2013   K 3.5 01/25/2013   CL 102 01/25/2013   CO2 23 01/25/2013    Lab Results  Component Value Date   HGBA1C 9.5* 01/25/2013   Lipid Panel     Component Value Date/Time   CHOL 215* 01/25/2013 1733   TRIG 380* 01/25/2013 1733   HDL 87 01/25/2013 1733   CHOLHDL 2.5 01/25/2013 1733   VLDL 76* 01/25/2013 1733   LDLCALC 52 01/25/2013 1733       Assessment and plan:   Patient Active Problem List   Diagnosis Date Noted  . AAT (alpha-1-antitrypsin) deficiency 05/31/2013  . Unspecified essential hypertension 05/31/2013  . Unspecified asthma, with exacerbation 05/31/2013  . Type II or unspecified type diabetes mellitus without mention of complication, uncontrolled  05/31/2013    Nebulizer treatment given in office today Solumedrol 125 mg IM given in office Referral to pulmonology for evaluation and treatment of this condition  Check A1c, labs today:  9.7%. Add glimepiride 2 mg daily to regimen. Encourage exercise.   Follow labs Encouraged pt to get [facility] card today Continue home nebulizer as needed   Follow up in 1 month  The patient was given clear instructions to go to ER or return to medical center if symptoms don't improve, worsen or new problems develop.  The patient verbalized understanding.  The patient was told to call to get lab results if they haven't heard anything in the next week.    Rodney Langton, MD, CDE, FAAFP [facility] [place], [place]

## 2013-05-31 NOTE — Patient Instructions (Addendum)
Alpha-1 Antitrypsin (A1AT, AAT) This is a test to diagnose the cause of early onset emphysema and/or liver problems. It also helps determine the risk of developing alpha-1 antitrypsin-related emphysema and/or liver disease in infants and children who may inherit this risk. This is a test that is often done when there are signs of liver disease as an infant or young child, when you develop emphysema (a disease that damages the lungs) before age 42, or when you have a close relative with alpha-1 antitrypsin deficiency.  Alpha-1 antitrypsin deficiency is thought to be one of the most frequent genetic deficiencies in Caucasians. In the [place] about 1 in 2,000 have some degree of AAT deficiency. PREPARATION FOR TEST No preparation or fasting is necessary unless you have been informed otherwise. A blood sample is obtained by inserting a needle into a vein in the arm. NORMAL FINDINGS   Adults 74 to 42 years of age (M-phenotype): 78-200 mg/dL (1.61-0.96 g/L)  Adults > age 23: 115-200 mg/dL (0.45-4.09 g/L)  Neonate: 145-270 mg/dL (8.11-9.14 g/L Ranges for normal findings may vary among different laboratories and hospitals. You should always check with your doctor after having lab work or other tests done to discuss the meaning of your test results and whether your values are considered within normal limits. MEANING OF TEST  Your caregiver will go over the test results with you and discuss the importance and meaning of your results, as well as treatment options and the need for additional tests if necessary. OBTAINING THE TEST RESULTS  It is your responsibility to obtain your test results. Ask the lab or department performing the test when and how you will get your results. Document Released: 12/29/2004 Document Revised: 02/29/2012 Document Reviewed: 11/10/2008 [facility] Patient Information 2014 [place], [place]. Asthma Attack Prevention HOW CAN ASTHMA BE PREVENTED? Currently, there is no way to prevent  asthma from starting. However, you can take steps to control the disease and prevent its symptoms after you have been diagnosed. Learn about your asthma and how to control it. Take an active role to control your asthma by working with your caregiver to create and follow an asthma action plan. An asthma action plan guides you in taking your medicines properly, avoiding factors that make your asthma worse, tracking your level of asthma control, responding to worsening asthma, and seeking emergency care when needed. To track your asthma, keep records of your symptoms, check your peak flow number using a peak flow meter (handheld device that shows how well air moves out of your lungs), and get regular asthma checkups.  Other ways to prevent asthma attacks include:  Use medicines as your caregiver directs.  Identify and avoid things that make your asthma worse (as much as you can).  Keep track of your asthma symptoms and level of control.  Get regular checkups for your asthma.  With your caregiver, write a detailed plan for taking medicines and managing an asthma attack. Then be sure to follow your action plan. Asthma is an ongoing condition that needs regular monitoring and treatment.  Identify and avoid asthma triggers. A number of outdoor allergens and irritants (pollen, mold, cold air, air pollution) can trigger asthma attacks. Find out what causes or makes your asthma worse, and take steps to avoid those triggers.  Monitor your breathing. Learn to recognize warning signs of an attack, such as slight coughing, wheezing or shortness of breath. However, your lung function may already decrease before you notice any signs or symptoms, so regularly measure and  record your peak airflow with a home peak flow meter.  Identify and treat attacks early. If you act quickly, you're less likely to have a severe attack. You will also need less medicine to control your symptoms. When your peak flow measurements  decrease and alert you to an upcoming attack, take your medicine as instructed, and immediately stop any activity that may have triggered the attack. If your symptoms do not improve, get medical help.  Pay attention to increasing quick-relief inhaler use. If you find yourself relying on your quick-relief inhaler (such as albuterol), your asthma is not under control. See your caregiver about adjusting your treatment. IDENTIFY AND CONTROL FACTORS THAT MAKE YOUR ASTHMA WORSE A number of common things can set off or make your asthma symptoms worse (asthma triggers). Keep track of your asthma symptoms for several weeks, detailing all the environmental and emotional factors that are linked with your asthma. When you have an asthma attack, go back to your asthma diary to see which factor, or combination of factors, might have contributed to it. Once you know what these factors are, you can take steps to control many of them.  Allergies: If you have allergies and asthma, it is important to take asthma prevention steps at home. Asthma attacks (worsening of asthma symptoms) can be triggered by allergies, which can cause temporary increased inflammation of your airways. Minimizing contact with the substance to which you are allergic will help prevent an asthma attack. Animal Dander:   Some people are allergic to the flakes of skin or dried saliva from animals with fur or feathers. Keep these pets out of your home.  If you can't keep a pet outdoors, keep the pet out of your bedroom and other sleeping areas at all times, and keep the door closed.  Remove carpets and furniture covered with cloth from your home. If that is not possible, keep the pet away from fabric-covered furniture and carpets. Dust Mites:  Many people with asthma are allergic to dust mites. Dust mites are tiny bugs that are found in every home, in mattresses, pillows, carpets, fabric-covered furniture, bedcovers, clothes, stuffed toys, fabric,  and other fabric-covered items.  Cover your mattress in a special dust-proof cover.  Cover your pillow in a special dust-proof cover, or wash the pillow each week in hot water. Water must be hotter than 130 F to kill dust mites. Cold or warm water used with detergent and bleach can also be effective.  Wash the sheets and blankets on your bed each week in hot water.  Try not to sleep or lie on cloth-covered cushions.  Call ahead when traveling and ask for a smoke-free hotel room. Bring your own bedding and pillows, in case the hotel only supplies feather pillows and down comforters, which may contain dust mites and cause asthma symptoms.  Remove carpets from your bedroom and those laid on concrete, if you can.  Keep stuffed toys out of the bed, or wash the toys weekly in hot water or cooler water with detergent and bleach. Cockroaches:  Many people with asthma are allergic to the droppings and remains of cockroaches.  Keep food and garbage in closed containers. Never leave food out.  Use poison baits, traps, powders, gels, or paste (for example, boric acid).  If a spray is used to kill cockroaches, stay out of the room until the odor goes away. Indoor Mold:  Fix leaky faucets, pipes, or other sources of water that have mold around them.  Clean  floors and moldy surfaces with a fungicide or diluted bleach.  Avoid using humidifiers, vaporizers, or swamp coolers. These can spread molds through the air. Pollen and Outdoor Mold:  When pollen or mold spore counts are high, try to keep your windows closed.  Stay indoors with windows closed from late morning to afternoon, if you can. Pollen and some mold spore counts are highest at that time.  Ask your caregiver whether you need to take or increase anti-inflammatory medicine before your allergy season starts. Irritants:   Tobacco smoke is an irritant. If you smoke, ask your caregiver how you can quit. Ask family members to quit  smoking, too. Do not allow smoking in your home or car.  If possible, do not use a wood-burning stove, kerosene heater, or fireplace. Minimize exposure to all sources of smoke, including incense, candles, fires, and fireworks.  Try to stay away from strong odors and sprays, such as perfume, talcum powder, hair spray, and paints.  Decrease humidity in your home and use an indoor air cleaning device. Reduce indoor humidity to below 60 percent. Dehumidifiers or central air conditioners can do this.  Decrease house dust exposure by changing furnace and air cooler filters frequently.  Try to have someone else vacuum for you once or twice a week, if you can. Stay out of rooms while they are being vacuumed and for a short while afterward.  If you vacuum, use a dust mask from a hardware store, a double-layered or microfilter vacuum cleaner bag, or a vacuum cleaner with a HEPA filter.  Sulfites in foods and beverages can be irritants. Do not drink beer or wine, or eat dried fruit, processed potatoes, or shrimp if they cause asthma symptoms.  Cold air can trigger an asthma attack. Cover your nose and mouth with a scarf on cold or windy days.  Several health conditions can make asthma more difficult to manage, including runny nose, sinus infections, reflux disease, psychological stress, and sleep apnea. Your caregiver will treat these conditions, as well.  Avoid close contact with people who have a cold or the flu, since your asthma symptoms may get worse if you catch the infection from them. Wash your hands thoroughly after touching items that may have been handled by people with a respiratory infection.  Get a flu shot every year to protect against the flu virus, which often makes asthma worse for days or weeks. Also get a pneumonia shot once every 5 10 years. Medicines:  Aspirin and other pain relievers can cause asthma attacks. Ten percent to 20% of people with asthma have sensitivity to aspirin or  a group of pain relievers called non-steroidal anti-inflammatory medicines (NSAIDS), such as ibuprofen and naproxen. These medicines are used to treat pain and reduce fevers. Asthma attacks caused by any of these medicines can be severe and even fatal. These medicines must be avoided in people who have known aspirin sensitive asthma. Products with acetaminophen are considered safe for people who have asthma. It is important that people with aspirin sensitivity read labels of all over-the-counter medicines used to treat pain, colds, coughs, and fever.  Beta blockers and ACE inhibitors are other medicines which you should discuss with your caregiver, in relation to your asthma. ALLERGY SKIN TESTING  Ask your asthma caregiver about allergy skin testing or blood testing (RAST test) to identify the allergens to which you are sensitive. If you are found to have allergies, allergy shots (immunotherapy) for asthma may help prevent future allergies and asthma.  With allergy shots, small doses of allergens (substances to which you are allergic) are injected under your skin on a regular schedule. Over a period of time, your body may become used to the allergen and less responsive with asthma symptoms. You can also take measures to minimize your exposure to those allergens. EXERCISE  If you have exercise-induced asthma, or are planning vigorous exercise, or exercise in cold, humid, or dry environments, prevent exercise-induced asthma by following your caregiver's advice regarding asthma treatment before exercising. Document Released: 11/25/2009 Document Revised: 11/23/2012 Document Reviewed: 11/25/2009 [facility] Patient Information 2014 [place], [place]. Asthma Attack Prevention HOW CAN ASTHMA BE PREVENTED? Currently, there is no way to prevent asthma from starting. However, you can take steps to control the disease and prevent its symptoms after you have been diagnosed. Learn about your asthma and how to control it.  Take an active role to control your asthma by working with your caregiver to create and follow an asthma action plan. An asthma action plan guides you in taking your medicines properly, avoiding factors that make your asthma worse, tracking your level of asthma control, responding to worsening asthma, and seeking emergency care when needed. To track your asthma, keep records of your symptoms, check your peak flow number using a peak flow meter (handheld device that shows how well air moves out of your lungs), and get regular asthma checkups.  Other ways to prevent asthma attacks include:  Use medicines as your caregiver directs.  Identify and avoid things that make your asthma worse (as much as you can).  Keep track of your asthma symptoms and level of control.  Get regular checkups for your asthma.  With your caregiver, write a detailed plan for taking medicines and managing an asthma attack. Then be sure to follow your action plan. Asthma is an ongoing condition that needs regular monitoring and treatment.  Identify and avoid asthma triggers. A number of outdoor allergens and irritants (pollen, mold, cold air, air pollution) can trigger asthma attacks. Find out what causes or makes your asthma worse, and take steps to avoid those triggers.  Monitor your breathing. Learn to recognize warning signs of an attack, such as slight coughing, wheezing or shortness of breath. However, your lung function may already decrease before you notice any signs or symptoms, so regularly measure and record your peak airflow with a home peak flow meter.  Identify and treat attacks early. If you act quickly, you're less likely to have a severe attack. You will also need less medicine to control your symptoms. When your peak flow measurements decrease and alert you to an upcoming attack, take your medicine as instructed, and immediately stop any activity that may have triggered the attack. If your symptoms do not  improve, get medical help.  Pay attention to increasing quick-relief inhaler use. If you find yourself relying on your quick-relief inhaler (such as albuterol), your asthma is not under control. See your caregiver about adjusting your treatment. IDENTIFY AND CONTROL FACTORS THAT MAKE YOUR ASTHMA WORSE A number of common things can set off or make your asthma symptoms worse (asthma triggers). Keep track of your asthma symptoms for several weeks, detailing all the environmental and emotional factors that are linked with your asthma. When you have an asthma attack, go back to your asthma diary to see which factor, or combination of factors, might have contributed to it. Once you know what these factors are, you can take steps to control many of them.  Allergies: If  you have allergies and asthma, it is important to take asthma prevention steps at home. Asthma attacks (worsening of asthma symptoms) can be triggered by allergies, which can cause temporary increased inflammation of your airways. Minimizing contact with the substance to which you are allergic will help prevent an asthma attack. Animal Dander:   Some people are allergic to the flakes of skin or dried saliva from animals with fur or feathers. Keep these pets out of your home.  If you can't keep a pet outdoors, keep the pet out of your bedroom and other sleeping areas at all times, and keep the door closed.  Remove carpets and furniture covered with cloth from your home. If that is not possible, keep the pet away from fabric-covered furniture and carpets. Dust Mites:  Many people with asthma are allergic to dust mites. Dust mites are tiny bugs that are found in every home, in mattresses, pillows, carpets, fabric-covered furniture, bedcovers, clothes, stuffed toys, fabric, and other fabric-covered items.  Cover your mattress in a special dust-proof cover.  Cover your pillow in a special dust-proof cover, or wash the pillow each week in hot  water. Water must be hotter than 130 F to kill dust mites. Cold or warm water used with detergent and bleach can also be effective.  Wash the sheets and blankets on your bed each week in hot water.  Try not to sleep or lie on cloth-covered cushions.  Call ahead when traveling and ask for a smoke-free hotel room. Bring your own bedding and pillows, in case the hotel only supplies feather pillows and down comforters, which may contain dust mites and cause asthma symptoms.  Remove carpets from your bedroom and those laid on concrete, if you can.  Keep stuffed toys out of the bed, or wash the toys weekly in hot water or cooler water with detergent and bleach. Cockroaches:  Many people with asthma are allergic to the droppings and remains of cockroaches.  Keep food and garbage in closed containers. Never leave food out.  Use poison baits, traps, powders, gels, or paste (for example, boric acid).  If a spray is used to kill cockroaches, stay out of the room until the odor goes away. Indoor Mold:  Fix leaky faucets, pipes, or other sources of water that have mold around them.  Clean floors and moldy surfaces with a fungicide or diluted bleach.  Avoid using humidifiers, vaporizers, or swamp coolers. These can spread molds through the air. Pollen and Outdoor Mold:  When pollen or mold spore counts are high, try to keep your windows closed.  Stay indoors with windows closed from late morning to afternoon, if you can. Pollen and some mold spore counts are highest at that time.  Ask your caregiver whether you need to take or increase anti-inflammatory medicine before your allergy season starts. Irritants:   Tobacco smoke is an irritant. If you smoke, ask your caregiver how you can quit. Ask family members to quit smoking, too. Do not allow smoking in your home or car.  If possible, do not use a wood-burning stove, kerosene heater, or fireplace. Minimize exposure to all sources of smoke,  including incense, candles, fires, and fireworks.  Try to stay away from strong odors and sprays, such as perfume, talcum powder, hair spray, and paints.  Decrease humidity in your home and use an indoor air cleaning device. Reduce indoor humidity to below 60 percent. Dehumidifiers or central air conditioners can do this.  Decrease house dust exposure by  changing furnace and air cooler filters frequently.  Try to have someone else vacuum for you once or twice a week, if you can. Stay out of rooms while they are being vacuumed and for a short while afterward.  If you vacuum, use a dust mask from a hardware store, a double-layered or microfilter vacuum cleaner bag, or a vacuum cleaner with a HEPA filter.  Sulfites in foods and beverages can be irritants. Do not drink beer or wine, or eat dried fruit, processed potatoes, or shrimp if they cause asthma symptoms.  Cold air can trigger an asthma attack. Cover your nose and mouth with a scarf on cold or windy days.  Several health conditions can make asthma more difficult to manage, including runny nose, sinus infections, reflux disease, psychological stress, and sleep apnea. Your caregiver will treat these conditions, as well.  Avoid close contact with people who have a cold or the flu, since your asthma symptoms may get worse if you catch the infection from them. Wash your hands thoroughly after touching items that may have been handled by people with a respiratory infection.  Get a flu shot every year to protect against the flu virus, which often makes asthma worse for days or weeks. Also get a pneumonia shot once every 5 10 years. Medicines:  Aspirin and other pain relievers can cause asthma attacks. Ten percent to 20% of people with asthma have sensitivity to aspirin or a group of pain relievers called non-steroidal anti-inflammatory medicines (NSAIDS), such as ibuprofen and naproxen. These medicines are used to treat pain and reduce fevers.  Asthma attacks caused by any of these medicines can be severe and even fatal. These medicines must be avoided in people who have known aspirin sensitive asthma. Products with acetaminophen are considered safe for people who have asthma. It is important that people with aspirin sensitivity read labels of all over-the-counter medicines used to treat pain, colds, coughs, and fever.  Beta blockers and ACE inhibitors are other medicines which you should discuss with your caregiver, in relation to your asthma. ALLERGY SKIN TESTING  Ask your asthma caregiver about allergy skin testing or blood testing (RAST test) to identify the allergens to which you are sensitive. If you are found to have allergies, allergy shots (immunotherapy) for asthma may help prevent future allergies and asthma. With allergy shots, small doses of allergens (substances to which you are allergic) are injected under your skin on a regular schedule. Over a period of time, your body may become used to the allergen and less responsive with asthma symptoms. You can also take measures to minimize your exposure to those allergens. EXERCISE  If you have exercise-induced asthma, or are planning vigorous exercise, or exercise in cold, humid, or dry environments, prevent exercise-induced asthma by following your caregiver's advice regarding asthma treatment before exercising. Document Released: 11/25/2009 Document Revised: 11/23/2012 Document Reviewed: 11/25/2009 [facility] Patient Information 2014 [place], [place].

## 2013-06-01 ENCOUNTER — Telehealth: Payer: Self-pay | Admitting: *Deleted

## 2013-06-01 NOTE — Progress Notes (Signed)
Quick Note:  Please inform patient that her labs came back revealing that her blood sugar was elevated. He was over 200. Work hard on getting her blood sugars down. Please refer her to diabetes education for further treatment and management and also to see a dietitian.  Rodney Langton, MD, CDE, FAAFP [facility] [place], [place]   ______

## 2013-06-01 NOTE — Telephone Encounter (Signed)
06/01/13 Attempt to reach patient not available message left via telephone  thatt her blood sugar was elevated. Over 200 per Dr. Laural Benes and diabetic education referral was send  according to Arna Medici along with a dietitian. P.Lexington Krotz,RN BSN [facility]

## 2013-06-02 ENCOUNTER — Institutional Professional Consult (permissible substitution): Payer: Self-pay | Admitting: Internal Medicine

## 2013-06-21 ENCOUNTER — Institutional Professional Consult (permissible substitution): Payer: Self-pay | Admitting: Internal Medicine

## 2013-07-05 ENCOUNTER — Ambulatory Visit: Payer: Self-pay

## 2013-10-05 ENCOUNTER — Emergency Department (INDEPENDENT_AMBULATORY_CARE_PROVIDER_SITE_OTHER): Payer: Self-pay

## 2013-10-05 ENCOUNTER — Encounter (HOSPITAL_COMMUNITY): Payer: Self-pay | Admitting: Emergency Medicine

## 2013-10-05 ENCOUNTER — Emergency Department (INDEPENDENT_AMBULATORY_CARE_PROVIDER_SITE_OTHER)
Admission: EM | Admit: 2013-10-05 | Discharge: 2013-10-05 | Disposition: A | Discharge status: Home / Self Care | Payer: Self-pay | Source: Home / Self Care

## 2013-10-05 DIAGNOSIS — M25469 Effusion, unspecified knee: Secondary | ICD-10-CM

## 2013-10-05 DIAGNOSIS — S8990XA Unspecified injury of unspecified lower leg, initial encounter: Secondary | ICD-10-CM

## 2013-10-05 DIAGNOSIS — M25462 Effusion, left knee: Secondary | ICD-10-CM

## 2013-10-05 DIAGNOSIS — S8992XA Unspecified injury of left lower leg, initial encounter: Secondary | ICD-10-CM

## 2013-10-05 MED ORDER — HYDROCODONE-ACETAMINOPHEN 5-325 MG PO TABS: 1.0000 | ORAL_TABLET | ORAL | Status: DC | PRN
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
## 2013-10-05 NOTE — ED Provider Notes (Signed)
Medical screening examination/treatment/procedure(s) were performed by non-physician practitioner and as supervising physician I was immediately available for consultation/collaboration.  Leslee Home, M.D.  Reuben Likes, MD 10/05/13 [phone]
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
## 2013-10-17 ENCOUNTER — Encounter: Payer: Self-pay | Admitting: Internal Medicine

## 2013-10-17 ENCOUNTER — Ambulatory Visit: Payer: Self-pay | Attending: Internal Medicine | Admitting: Internal Medicine

## 2013-10-17 VITALS — BP 160/122 | HR 103 | Temp 98.4°F | Resp 16 | Ht 64.0 in | Wt 258.0 lb

## 2013-10-17 DIAGNOSIS — IMO0001 Reserved for inherently not codable concepts without codable children: Secondary | ICD-10-CM

## 2013-10-17 DIAGNOSIS — J45901 Unspecified asthma with (acute) exacerbation: Secondary | ICD-10-CM

## 2013-10-17 DIAGNOSIS — Z0000 Encounter for general adult medical examination without abnormal findings: Secondary | ICD-10-CM

## 2013-10-17 DIAGNOSIS — I10 Essential (primary) hypertension: Secondary | ICD-10-CM

## 2013-10-17 DIAGNOSIS — E8801 Alpha-1-antitrypsin deficiency: Secondary | ICD-10-CM

## 2013-10-17 DIAGNOSIS — E119 Type 2 diabetes mellitus without complications: Secondary | ICD-10-CM | POA: Insufficient documentation

## 2013-10-17 LAB — LIPID PANEL
HDL: 68 mg/dL (ref >39)
LDL Cholesterol: 64 mg/dL (ref 0–99)
VLDL: 62 mg/dL — ABNORMAL HIGH (ref 0–40)

## 2013-10-17 LAB — POCT GLYCOSYLATED HEMOGLOBIN (HGB A1C): Hemoglobin A1C: 8.8

## 2013-10-17 LAB — POCT URINE PREGNANCY: Preg Test, Ur: NEGATIVE

## 2013-10-17 MED ORDER — ALBUTEROL SULFATE HFA 108 (90 BASE) MCG/ACT IN AERS: 2.0000 | INHALATION_SPRAY | Freq: Four times a day (QID) | RESPIRATORY_TRACT | Status: DC | PRN

## 2013-10-17 MED ORDER — IPRATROPIUM BROMIDE 0.02 % IN SOLN: 500.0000 ug | RESPIRATORY_TRACT | Status: DC | PRN

## 2013-10-17 MED ORDER — GLIMEPIRIDE 2 MG PO TABS: 2.0000 mg | ORAL_TABLET | Freq: Every day | ORAL | Status: DC

## 2013-10-17 MED ORDER — OXYCODONE-ACETAMINOPHEN 10-325 MG PO TABS: 1.0000 | ORAL_TABLET | Freq: Three times a day (TID) | ORAL | Status: DC | PRN

## 2013-10-17 MED ORDER — HYDROCHLOROTHIAZIDE 25 MG PO TABS: 25.0000 mg | ORAL_TABLET | Freq: Every day | ORAL | Status: DC

## 2013-10-17 MED ORDER — ALBUTEROL SULFATE (5 MG/ML) 0.5% IN NEBU: 2.5000 mg | INHALATION_SOLUTION | RESPIRATORY_TRACT | Status: DC | PRN

## 2013-10-17 MED ORDER — TRAMADOL HCL 50 MG PO TABS: 100.0000 mg | ORAL_TABLET | Freq: Three times a day (TID) | ORAL | Status: DC | PRN

## 2013-10-17 MED ORDER — METFORMIN HCL ER (MOD) 1000 MG PO TB24: 1000.0000 mg | ORAL_TABLET | Freq: Two times a day (BID) | ORAL | Status: DC

## 2013-10-17 NOTE — Progress Notes (Signed)
Patient ID: Carla Hicks, female   DOB: 1971-10-31, 42 y.o.   MRN: [id number]   CC: follow up  HPI: 42 year old female with past medical history hypertension and diabetes who presented to clinic for followup. Patient reports intermittent vaginal bleed and had a last menstrual period at the beginning of October this year. She has not seen GYN recently. No abdominal pain, no nausea or vomiting.  Allergies  Allergen Reactions  . Penicillins    Past Medical History  Diagnosis Date  . Diabetes mellitus   . Hypertension   . Urinary bladder disorder   . Asthma   . Emphysema    No current outpatient prescriptions on file prior to visit.   No current facility-administered medications on file prior to visit.   Family medical history significant for HTN, HLD  History   Social History  . Marital Status: Legally Separated    Spouse Name: N/A    Number of Children: N/A  . Years of Education: N/A   Occupational History  . Not on file.   Social History Main Topics  . Smoking status: Former Games developer  . Smokeless tobacco: Not on file  . Alcohol Use: No  . Drug Use: No  . Sexual Activity:    Other Topics Concern  . Not on file   Social History Narrative  . No narrative on file    Review of Systems  Constitutional: Negative for fever, chills, diaphoresis, activity change, appetite change and fatigue.  HENT: Negative for ear pain, nosebleeds, congestion, facial swelling, rhinorrhea, neck pain, neck stiffness and ear discharge.   Eyes: Negative for pain, discharge, redness, itching and visual disturbance.  Respiratory: Negative for cough, choking, chest tightness, shortness of breath, wheezing and stridor.   Cardiovascular: Negative for chest pain, palpitations and leg swelling.  Gastrointestinal: Negative for abdominal distention.  Genitourinary: Negative for dysuria, urgency, frequency, hematuria, flank pain, decreased urine volume, difficulty urinating and dyspareunia.   Musculoskeletal: Negative for back pain, joint swelling, arthralgias and gait problem.  Neurological: Negative for dizziness, tremors, seizures, syncope, facial asymmetry, speech difficulty, weakness, light-headedness, numbness and headaches.  Hematological: Negative for adenopathy. Does not bruise/bleed easily.  Psychiatric/Behavioral: Negative for hallucinations, behavioral problems, confusion, dysphoric mood, decreased concentration and agitation.    Objective:   Filed Vitals:   10/17/13 1109  BP: 160/122  Pulse: 103  Temp: 98.4 F (36.9 C)  Resp: 16    Physical Exam  Constitutional: Appears well-developed and well-nourished. No distress.  HENT: Normocephalic. External right and left ear normal. Oropharynx is clear and moist.  Eyes: Conjunctivae and EOM are normal. PERRLA, no scleral icterus.  Neck: Normal ROM. Neck supple. No JVD. No tracheal deviation. No thyromegaly.  CVS: RRR, S1/S2 +, no murmurs, no gallops, no carotid bruit.  Pulmonary: Effort and breath sounds normal, no stridor, rhonchi, wheezes, rales.  Abdominal: Soft. BS +,  no distension, tenderness, rebound or guarding.  Musculoskeletal: Normal range of motion. No edema and no tenderness.  Lymphadenopathy: No lymphadenopathy noted, cervical, inguinal. Neuro: Alert. Normal reflexes, muscle tone coordination. No cranial nerve deficit. Skin: Skin is warm and dry. No rash noted. Not diaphoretic. No erythema. No pallor.  Psychiatric: Normal mood and affect. Behavior, judgment, thought content normal.   Lab Results  Component Value Date   WBC 9.1 09/22/2012   HGB 13.3 09/22/2012   HCT 37.1 09/22/2012   MCV 85.7 09/22/2012   PLT 301 09/22/2012   Lab Results  Component Value Date   CREATININE 0.76 05/31/2013  BUN 11 05/31/2013   NA 138 05/31/2013   K 3.6 05/31/2013   CL 99 05/31/2013   CO2 29 05/31/2013    Lab Results  Component Value Date   HGBA1C 9.7% 05/31/2013   Lipid Panel     Component Value Date/Time    CHOL 215* 01/25/2013 1733   TRIG 380* 01/25/2013 1733   HDL 87 01/25/2013 1733   CHOLHDL 2.5 01/25/2013 1733   VLDL 76* 01/25/2013 1733   LDLCALC 52 01/25/2013 1733       Assessment and plan:   Patient Active Problem List   Diagnosis Date Noted  . Preventative health care 10/17/2013    Priority: Medium - GYN referral for PAP and intermittent bleeding - check A1c, lipid panel and TSH - check hCG urine - had knee pain so referral to pain management clinic provided  . Unspecified essential hypertension 05/31/2013    Priority: Medium - We have discussed target BP range - I have advised pt to check BP regularly and to call us back if the numbers are higher than 140/90 - discussed the importance of compliance with medical therapy and diet  - continue Hctz  . Unspecified asthma, with exacerbation 05/31/2013    Priority: Medium - stable  . Type II or unspecified type diabetes mellitus without mention of complication, uncontrolled 05/31/2013    Priority: Medium - check A1c today - continue metformin and amaryl

## 2013-10-17 NOTE — Progress Notes (Signed)
Pt is here today to follow up on her diabetes and HTN. Pt has been dealing with knee pain since May. Today the pain is an 8, aching, throbbing, shooting, stabbing and causing her toes to tingle.

## 2013-10-17 NOTE — Patient Instructions (Signed)

## 2013-10-18 ENCOUNTER — Encounter (HOSPITAL_COMMUNITY): Payer: Self-pay

## 2013-10-18 ENCOUNTER — Inpatient Hospital Stay (HOSPITAL_COMMUNITY): Payer: Self-pay

## 2013-10-18 ENCOUNTER — Inpatient Hospital Stay (HOSPITAL_COMMUNITY)
Admission: AD | Admit: 2013-10-18 | Discharge: 2013-10-18 | Disposition: A | Discharge status: Home / Self Care | Payer: Self-pay | Source: Ambulatory Visit | Attending: Obstetrics and Gynecology | Admitting: Obstetrics and Gynecology

## 2013-10-18 DIAGNOSIS — I10 Essential (primary) hypertension: Secondary | ICD-10-CM | POA: Insufficient documentation

## 2013-10-18 DIAGNOSIS — N930 Postcoital and contact bleeding: Secondary | ICD-10-CM | POA: Insufficient documentation

## 2013-10-18 DIAGNOSIS — E119 Type 2 diabetes mellitus without complications: Secondary | ICD-10-CM | POA: Insufficient documentation

## 2013-10-18 DIAGNOSIS — R1032 Left lower quadrant pain: Secondary | ICD-10-CM | POA: Insufficient documentation

## 2013-10-18 LAB — COMPREHENSIVE METABOLIC PANEL
AST: 14 U/L (ref 0–37)
BUN: 7 mg/dL (ref 6–23)
CO2: 29 mEq/L (ref 19–32)
Calcium: 8.9 mg/dL (ref 8.4–10.5)
Chloride: 99 mEq/L (ref 96–112)
Creatinine, Ser: 0.56 mg/dL (ref 0.50–1.10)
GFR calc Af Amer: >90 mL/min (ref >90)
GFR calc non Af Amer: >90 mL/min (ref >90)
Glucose, Bld: 305 mg/dL — ABNORMAL HIGH (ref 70–99)
Sodium: 137 mEq/L (ref 135–145)
Total Bilirubin: 0.3 mg/dL (ref 0.3–1.2)

## 2013-10-18 LAB — LIPASE, BLOOD: Lipase: 44 U/L (ref 11–59)

## 2013-10-18 LAB — URINALYSIS, ROUTINE W REFLEX MICROSCOPIC
Ketones, ur: NEGATIVE mg/dL
Leukocytes, UA: NEGATIVE
Nitrite: NEGATIVE
Protein, ur: NEGATIVE mg/dL
Urobilinogen, UA: 0.2 mg/dL (ref 0.0–1.0)

## 2013-10-18 LAB — URINE MICROSCOPIC-ADD ON

## 2013-10-18 LAB — CBC
HCT: 37.3 % (ref 36.0–46.0)
Platelets: 270 10*3/uL (ref 150–400)
RDW: 12.1 % (ref 11.5–15.5)
WBC: 8.9 10*3/uL (ref 4.0–10.5)

## 2013-10-18 LAB — AMYLASE: Amylase: 43 U/L (ref 0–105)

## 2013-10-18 LAB — POCT PREGNANCY, URINE: Preg Test, Ur: NEGATIVE

## 2013-10-18 MED ORDER — OXYCODONE-ACETAMINOPHEN 5-325 MG PO TABS
2.0000 | ORAL_TABLET | Freq: Once | ORAL | Status: AC
  Administered 2013-10-18: 2 via ORAL
  Filled 2013-10-18: qty 2

## 2013-10-18 MED ORDER — KETOROLAC TROMETHAMINE 60 MG/2ML IM SOLN
60.0000 mg | Freq: Once | INTRAMUSCULAR | Status: AC
  Administered 2013-10-18: 60 mg via INTRAMUSCULAR
  Filled 2013-10-18: qty 2

## 2013-10-18 NOTE — MAU Provider Note (Signed)
History    Carla Hicks is a 42 y.o. [id number] presenting with abnormal vaginal bleeding and LLQ / suprapubic pain. The patient endorsed this complaint to her primary care provider yesterday (at a visit initially scheduled to evaluate a knee chief complaint) and was told to visit a gynecology specialist. The patient's bleeding typically only follows coitus and does not regularly show up in underwear, on pads, etc. The patient did experience bright red bleeding and "what felt like labor pains" this morning while at work; she states she passed a clot roughly the size of a quarter while having the intense contraction-like pain. Last day of regular LMP around 09-30-13. No condom or OC use. No new sexual partners. Patient screened for STDs annually at health dept with last visit 10/2012. The patient also complains of a painful left knee and reports a fever of 101F yesterday. She is afebrile today. Knee and abd pain is refractory to ultram and OTC pain medicines prescribed to provide relief for same. Patient seen at urgent care and primary care for knee complaint and referred to orthopaedic specialist. See exam below for description; patient strongly advised to follow up with ortho specialist for evaluation and management--discussed risks of not following up on treatment. Patient does not endorse ETOH, tobacco, drug use.    The patient has a complicated medical history including morbid obesity, poorly controlled DM (reports last A1C 8.1), HTN, asthma, and emphysema. She also endorses alpha-1 antitrypsin deficiency, but did not have documentation of this complaint or additional details available today to verify this for her problem list.  CSN: [id number]  Arrival date and time: 10/18/13 1400   First Provider Initiated Contact with Patient 10/18/13 1440      Chief Complaint  Patient presents with  . Abdominal Cramping  . Vaginal Bleeding   HPI  OB History   Grav Para Term Preterm Abortions TAB SAB Ect  Mult Living   5 4 4       4       Past Medical History  Diagnosis Date  . Diabetes mellitus   . Hypertension   . Urinary bladder disorder   . Asthma   . Emphysema     Past Surgical History  Procedure Laterality Date  . Cesarean section      History reviewed. No pertinent family history.  History  Substance Use Topics  . Smoking status: Former Games developer  . Smokeless tobacco: Not on file  . Alcohol Use: No    Allergies:  Allergies  Allergen Reactions  . Penicillins Hives    Prescriptions prior to admission  Medication Sig Dispense Refill  . albuterol (PROVENTIL HFA;VENTOLIN HFA) 108 (90 BASE) MCG/ACT inhaler Inhale 2 puffs into the lungs every 6 (six) hours as needed for wheezing or shortness of breath. Sob  18 g  8  . albuterol (PROVENTIL) (5 MG/ML) 0.5% nebulizer solution Take 0.5 mLs (2.5 mg total) by nebulization every 4 (four) hours as needed for wheezing or shortness of breath. Sob  20 mL  5  . glimepiride (AMARYL) 2 MG tablet Take 1 tablet (2 mg total) by mouth daily before breakfast.  30 tablet  5  . hydrochlorothiazide (HYDRODIURIL) 25 MG tablet Take 1 tablet (25 mg total) by mouth daily.  30 tablet  5  . metFORMIN (GLUCOPHAGE XR) 1000 MG (MOD) 24 hr tablet Take 1 tablet (1,000 mg total) by mouth 2 (two) times daily with a meal.  60 tablet  5  . Multiple Vitamin (MULTIVITAMIN WITH  MINERALS) TABS tablet Take 1 tablet by mouth daily.      Marland Kitchen oxyCODONE-acetaminophen (PERCOCET) 10-325 MG per tablet Take 1 tablet by mouth every 8 (eight) hours as needed for pain.  60 tablet  0  . predniSONE (DELTASONE) 20 MG tablet Take 20 mg by mouth daily.      . traMADol (ULTRAM) 50 MG tablet Take 2 tablets (100 mg total) by mouth every 8 (eight) hours as needed for pain.  45 tablet  0    Review of Systems  Constitutional: Positive for chills. Negative for fever, weight loss and diaphoresis.       Patient endorses fever yesterday, chills at this time, and neither currently.  HENT:  Negative for sore throat.   Eyes: Negative.  Negative for blurred vision and double vision.  Respiratory: Negative.  Negative for shortness of breath and stridor.        Patient at respiratory baseline without deterioration since onset of CC.  Cardiovascular: Positive for leg swelling. Negative for chest pain and palpitations.       L knee more swollen than R.   Gastrointestinal: Positive for abdominal pain. Negative for heartburn, nausea, vomiting, diarrhea, constipation, blood in stool and melena.       See HPI.  Genitourinary: Negative.  Negative for dysuria, urgency, frequency, hematuria and flank pain.       No CVA tenderness endorsed.  Musculoskeletal: Positive for back pain and joint pain. Negative for falls and myalgias.       Knee pain bilaterally - L significantly worse. Back pain L lower lumbar.  Skin: Positive for rash.       Lesion L lateral leg mediolateral to patella. Erythema to cheeks, forehead, chin.   No new medications, soaps, exposures, etc.  Neurological: Negative.  Negative for weakness and headaches.  Endo/Heme/Allergies: Negative.   Psychiatric/Behavioral: Negative.    Physical Exam   Blood pressure 130/85, pulse 105, temperature 98 F (36.7 C), temperature source Oral, resp. rate 18, height 5\' 4"  (1.626 m), weight 119.069 kg (262 lb 8 oz), last menstrual period 09/27/2013, SpO2 97.00%.  Physical Exam  Constitutional: She is oriented to person, place, and time. She appears well-developed. No distress.  Morbidly obese patient. Significant abdominal adiposity. Pt endorses she may be "more swollen than normal." Patient looks stated age, is pleasant, and seems like a reliable historian.  HENT:  Head: Normocephalic and atraumatic.  Right Ear: External ear normal.  Left Ear: External ear normal.  Mouth/Throat: No oropharyngeal exudate.  Erythematous pigmentation most pronounced to frontal, maxillary, mental areas. Pt denies pain. No clear margins or raised borders.    Eyes: Pupils are equal, round, and reactive to light. Right eye exhibits no discharge. Left eye exhibits no discharge.  Neck: Normal range of motion. No JVD present. No tracheal deviation present.  Cardiovascular: Normal rate, normal heart sounds and intact distal pulses.  Exam reveals no gallop and no friction rub.   No murmur heard. Respiratory: Effort normal and breath sounds normal. No stridor.  GI: Soft. Bowel sounds are normal.  Abdominal exam difficult due to significant central adiposity. BS present in all quadrants. LUQ and RUQ mild discomfort upon palpation of spleen and liver. LLQ pain on palpation. No rebound, guarding, rigidity throughout. Patient reports more "swelling" than normal, but difficult to assess and determine if adiposity v ascites. Diffuse striae on abdomen throughout.   Genitourinary:  Pelvic Exam: normal female genitalia with no lesions or discharge noted. Moderate BRB noted in vaginal  canal with raisin size clot retrieved by swab superficial to cervical os (closed). Cervical motion tenderness on bimanual exam; no adnexal tenderness.  Musculoskeletal: She exhibits no edema.  L knee diffusely tender (medial and lateral joint spaces, patellar, popliteal fossa) to palpation and warm to touch when compared to R knee. Distal pulses, motor, sensation intact. No pitting edema. No cord palpated in calf / calf pain. Poorly healing lesion immediately distal / lateral to patella patient endorses has been slowly healing "for months."  Neurological: She is alert and oriented to person, place, and time.  Skin: Skin is warm. No rash noted. She is not diaphoretic. There is erythema. No pallor.  See HENT  Psychiatric: She has a normal mood and affect. Her behavior is normal. Judgment and thought content normal.    MAU Course  Procedures  MDM -STI testing -Pelvic Exam -CMP + CBC -LFTs -Pelvic Ultrasound  Results for orders placed during the hospital encounter of 10/18/13 (from  the past 24 hour(s))  URINALYSIS, ROUTINE W REFLEX MICROSCOPIC     Status: Abnormal   Collection Time    10/18/13  2:10 PM      Result Value Range   Color, Urine YELLOW  YELLOW   APPearance CLEAR  CLEAR   Specific Gravity, Urine 1.020  1.005 - 1.030   pH 6.0  5.0 - 8.0   Glucose, UA >1000 (*) NEGATIVE mg/dL   Hgb urine dipstick LARGE (*) NEGATIVE   Bilirubin Urine NEGATIVE  NEGATIVE   Ketones, ur NEGATIVE  NEGATIVE mg/dL   Protein, ur NEGATIVE  NEGATIVE mg/dL   Urobilinogen, UA 0.2  0.0 - 1.0 mg/dL   Nitrite NEGATIVE  NEGATIVE   Leukocytes, UA NEGATIVE  NEGATIVE  URINE MICROSCOPIC-ADD ON     Status: Abnormal   Collection Time    10/18/13  2:10 PM      Result Value Range   Squamous Epithelial / LPF FEW (*) RARE   WBC, UA 0-2  <3 WBC/hpf   RBC / HPF 3-6  <3 RBC/hpf   Bacteria, UA RARE  RARE  POCT PREGNANCY, URINE     Status: None   Collection Time    10/18/13  2:17 PM      Result Value Range   Preg Test, Ur NEGATIVE  NEGATIVE  CBC     Status: None   Collection Time    10/18/13  2:30 PM      Result Value Range   WBC 8.9  4.0 - 10.5 K/uL   RBC 4.31  3.87 - 5.11 MIL/uL   Hemoglobin 13.1  12.0 - 15.0 g/dL   HCT 16.1  09.6 - 04.5 %   MCV 86.5  78.0 - 100.0 fL   MCH 30.4  26.0 - 34.0 pg   MCHC 35.1  30.0 - 36.0 g/dL   RDW 40.9  81.1 - 91.4 %   Platelets 270  150 - 400 K/uL  COMPREHENSIVE METABOLIC PANEL     Status: Abnormal   Collection Time    10/18/13  2:30 PM      Result Value Range   Sodium 137  135 - 145 mEq/L   Potassium 3.5  3.5 - 5.1 mEq/L   Chloride 99  96 - 112 mEq/L   CO2 29  19 - 32 mEq/L   Glucose, Bld 305 (*) 70 - 99 mg/dL   BUN 7  6 - 23 mg/dL   Creatinine, Ser 7.82  0.50 - 1.10 mg/dL   Calcium 8.9  8.4 - 10.5 mg/dL   Total Protein 5.9 (*) 6.0 - 8.3 g/dL   Albumin 2.8 (*) 3.5 - 5.2 g/dL   AST 14  0 - 37 U/L   ALT 10  0 - 35 U/L   Alkaline Phosphatase 55  39 - 117 U/L   Total Bilirubin 0.3  0.3 - 1.2 mg/dL   GFR calc non Af Amer >90  >90 mL/min    GFR calc Af Amer >90  >90 mL/min  AMYLASE     Status: None   Collection Time    10/18/13  2:30 PM      Result Value Range   Amylase 43  0 - 105 U/L  LIPASE, BLOOD     Status: None   Collection Time    10/18/13  2:30 PM      Result Value Range   Lipase 44  11 - 59 U/L  WET PREP, GENITAL     Status: Abnormal   Collection Time    10/18/13  3:38 PM      Result Value Range   Yeast Wet Prep HPF POC NONE SEEN  NONE SEEN   Trich, Wet Prep NONE SEEN  NONE SEEN   Clue Cells Wet Prep HPF POC NONE SEEN  NONE SEEN   WBC, Wet Prep HPF POC FEW (*) NONE SEEN   US Transvaginal Non-ob  10/18/2013   CLINICAL DATA:  Pelvic pain, abnormal uterine bleeding  EXAM: TRANSABDOMINAL AND TRANSVAGINAL ULTRASOUND OF PELVIS  TECHNIQUE: Both transabdominal and transvaginal ultrasound examinations of the pelvis were performed. Transabdominal technique was performed for global imaging of the pelvis including uterus, ovaries, adnexal regions, and pelvic cul-de-sac. It was necessary to proceed with endovaginal exam following the transabdominal exam to visualize the endometrium and left ovary.  COMPARISON:  None  FINDINGS: Uterus  Measurements: 10.4 x 4.4 x 4.7 cm. No fibroids or other mass visualized.  Endometrium  Poorly visualized. 2.2 x 1.0 x 1.4 cm hyperechoic lesion within the endometrial cavity, favored to reflect hemorrhage. A solid lesion is considered unlikely given increased through transmission. No vascularity.  Right ovary  Measurements: 3.4 x 2.0 x 2.0 cm. Normal appearance/no adnexal mass.  Left ovary  Measurements: 2.6 x 1.9 x 1.8 cm. Normal appearance/no adnexal mass.  Other findings  No free fluid.  IMPRESSION: Suspected hemorrhage within the endometrial cavity.  Otherwise negative pelvic ultrasound.   Electronically Signed   By: Charline Bills M.D.   On: 10/18/2013 17:03   US Pelvis Complete  10/18/2013   CLINICAL DATA:  Pelvic pain, abnormal uterine bleeding  EXAM: TRANSABDOMINAL AND TRANSVAGINAL  ULTRASOUND OF PELVIS  TECHNIQUE: Both transabdominal and transvaginal ultrasound examinations of the pelvis were performed. Transabdominal technique was performed for global imaging of the pelvis including uterus, ovaries, adnexal regions, and pelvic cul-de-sac. It was necessary to proceed with endovaginal exam following the transabdominal exam to visualize the endometrium and left ovary.  COMPARISON:  None  FINDINGS: Uterus  Measurements: 10.4 x 4.4 x 4.7 cm. No fibroids or other mass visualized.  Endometrium  Poorly visualized. 2.2 x 1.0 x 1.4 cm hyperechoic lesion within the endometrial cavity, favored to reflect hemorrhage. A solid lesion is considered unlikely given increased through transmission. No vascularity.  Right ovary  Measurements: 3.4 x 2.0 x 2.0 cm. Normal appearance/no adnexal mass.  Left ovary  Measurements: 2.6 x 1.9 x 1.8 cm. Normal appearance/no adnexal mass.  Other findings  No free fluid.  IMPRESSION: Suspected hemorrhage within the endometrial  cavity.  Otherwise negative pelvic ultrasound.   Electronically Signed   By: Charline Bills M.D.   On: 10/18/2013 17:03     Assessment and Plan   1. Postcoital bleeding    FU with the clinic Return to MAU as needed  Jefm Petty 10/18/2013, 3:35 PM   I have seen the patient with the resident/student and agree with the above.  Tawnya Crook

## 2013-10-18 NOTE — MAU Note (Signed)
Patient is in with c/o abdominal pain and intermittent spotting especially during and after intercourse. She states that she had a normal period 09/27/13. She states that she was evaluated by dr Laural Benes yesterday but she passed a blood clot after intercourse and continue to have pain.

## 2013-10-19 LAB — GC/CHLAMYDIA PROBE AMP
CT Probe RNA: NEGATIVE
GC Probe RNA: NEGATIVE

## 2013-10-20 NOTE — MAU Provider Note (Signed)
Attestation of Attending Supervision of Advanced Practitioner (CNM/NP): Evaluation and management procedures were performed by the Advanced Practitioner under my supervision and collaboration.  I have reviewed the Advanced Practitioner's note and chart, and I agree with the management and plan.  Kirstin Kugler 10/20/2013 9:31 AM

## 2013-11-07 ENCOUNTER — Ambulatory Visit: Payer: Self-pay | Attending: Internal Medicine | Admitting: Internal Medicine

## 2013-11-07 ENCOUNTER — Encounter: Payer: Self-pay | Admitting: Internal Medicine

## 2013-11-07 VITALS — BP 173/129 | HR 108 | Temp 98.6°F | Resp 16 | Ht 64.0 in | Wt 267.0 lb

## 2013-11-07 DIAGNOSIS — M25562 Pain in left knee: Secondary | ICD-10-CM

## 2013-11-07 DIAGNOSIS — Z0000 Encounter for general adult medical examination without abnormal findings: Secondary | ICD-10-CM

## 2013-11-07 DIAGNOSIS — E8801 Alpha-1-antitrypsin deficiency: Secondary | ICD-10-CM

## 2013-11-07 DIAGNOSIS — I10 Essential (primary) hypertension: Secondary | ICD-10-CM

## 2013-11-07 MED ORDER — ALBUTEROL SULFATE HFA 108 (90 BASE) MCG/ACT IN AERS: 2.0000 | INHALATION_SPRAY | Freq: Four times a day (QID) | RESPIRATORY_TRACT | Status: DC | PRN

## 2013-11-07 MED ORDER — ALBUTEROL SULFATE (5 MG/ML) 0.5% IN NEBU: 2.5000 mg | INHALATION_SOLUTION | RESPIRATORY_TRACT | Status: DC | PRN

## 2013-11-07 MED ORDER — CYCLOBENZAPRINE HCL 5 MG PO TABS: 5.0000 mg | ORAL_TABLET | Freq: Two times a day (BID) | ORAL | Status: DC | PRN

## 2013-11-07 MED ORDER — FUROSEMIDE 20 MG PO TABS: 20.0000 mg | ORAL_TABLET | ORAL | Status: DC | PRN

## 2013-11-07 MED ORDER — TRAMADOL HCL 50 MG PO TABS
100.0000 mg | ORAL_TABLET | Freq: Three times a day (TID) | ORAL | Status: DC | PRN
Start: 2013-11-07 — End: 2014-03-27

## 2013-11-07 MED ORDER — METFORMIN HCL ER (MOD) 1000 MG PO TB24: 1000.0000 mg | ORAL_TABLET | Freq: Two times a day (BID) | ORAL | Status: DC

## 2013-11-07 MED ORDER — PREDNISONE 20 MG PO TABS: 20.0000 mg | ORAL_TABLET | Freq: Every day | ORAL | Status: DC

## 2013-11-07 MED ORDER — GLIMEPIRIDE 2 MG PO TABS: 2.0000 mg | ORAL_TABLET | Freq: Every day | ORAL | Status: DC

## 2013-11-07 MED ORDER — LOSARTAN POTASSIUM-HCTZ 100-25 MG PO TABS: 1.0000 | ORAL_TABLET | Freq: Every day | ORAL | Status: DC

## 2013-11-07 NOTE — Progress Notes (Unsigned)
Pt is here following up on her HTN and asthma. Pt reports having knee pain and shoulder pain.

## 2013-11-07 NOTE — Progress Notes (Unsigned)
Patient ID: AYJAH SHOW, female   DOB: 03-04-1971, 42 y.o.   MRN: [id number] Patient Demographics  Latrish Mogel, is a 42 y.o. female  WUJ:[id number]  [id number]  DOB - 1971-04-18  Chief Complaint  Patient presents with  . Follow-up        Subjective:   Joneen Roach today is here for a follow up visit. Patient is 42 year old female with a history of asthma, alpha-1 antitrypsin deficiency, presented to the clinic for followup, has not taken prednisone today, having mild wheezing. Patient has been taking chronic prednisone for years, states that she is on 60 mg daily.She has back pain from having large breasts and her bra is cutting into her shoulders, wants to try a bariatric surgery for breast reduction. BP extremely high at 173/129, asymptomatic, Her boyfriend also reports that she eats to much salt. Patient has No headache, No chest pain, No abdominal pain - No Nausea, No new weakness tingling or numbness  Objective:    Filed Vitals:   11/07/13 1517  BP: 173/129  Pulse: 108  Temp: 98.6 F (37 C)  TempSrc: Oral  Resp: 16  Height: 5\' 4"  (1.626 m)  Weight: 267 lb (121.11 kg)  SpO2: 99%     ALLERGIES:   Allergies  Allergen Reactions  . Penicillins Hives    PAST MEDICAL HISTORY: Past Medical History  Diagnosis Date  . Diabetes mellitus   . Hypertension   . Urinary bladder disorder   . Asthma   . Emphysema     MEDICATIONS AT HOME: Prior to Admission medications   Medication Sig Start Date End Date Taking? Authorizing Provider  albuterol (PROVENTIL HFA;VENTOLIN HFA) 108 (90 BASE) MCG/ACT inhaler Inhale 2 puffs into the lungs every 6 (six) hours as needed for wheezing or shortness of breath. Sob 10/17/13   Alison Murray, MD  albuterol (PROVENTIL) (5 MG/ML) 0.5% nebulizer solution Take 0.5 mLs (2.5 mg total) by nebulization every 4 (four) hours as needed for wheezing or shortness of breath. Sob 10/17/13   Alison Murray, MD  cyclobenzaprine (FLEXERIL) 5 MG  tablet Take 1 tablet (5 mg total) by mouth 2 (two) times daily as needed for muscle spasms. 11/07/13   Ripudeep Jenna Luo, MD  furosemide (LASIX) 20 MG tablet Take 1 tablet (20 mg total) by mouth as needed for fluid or edema. 11/07/13   Ripudeep Jenna Luo, MD  glimepiride (AMARYL) 2 MG tablet Take 1 tablet (2 mg total) by mouth daily before breakfast. 11/07/13   Ripudeep Jenna Luo, MD  losartan-hydrochlorothiazide (HYZAAR) 100-25 MG per tablet Take 1 tablet by mouth daily. 11/07/13   Ripudeep Jenna Luo, MD  metFORMIN (GLUMETZA) 1000 MG (MOD) 24 hr tablet Take 1 tablet (1,000 mg total) by mouth 2 (two) times daily with a meal. 11/07/13   Ripudeep Jenna Luo, MD  Multiple Vitamin (MULTIVITAMIN WITH MINERALS) TABS tablet Take 1 tablet by mouth daily.    Historical Provider, MD  oxyCODONE-acetaminophen (PERCOCET) 10-325 MG per tablet Take 1 tablet by mouth every 8 (eight) hours as needed for pain. 10/17/13   Alison Murray, MD  predniSONE (DELTASONE) 20 MG tablet Take 1 tablet (20 mg total) by mouth daily. 11/07/13   Ripudeep Jenna Luo, MD  traMADol (ULTRAM) 50 MG tablet Take 2 tablets (100 mg total) by mouth every 8 (eight) hours as needed. 11/07/13   Ripudeep Jenna Luo, MD     Exam  General appearance :Awake, alert, NAD, Speech Clear.  HEENT: Atraumatic and Normocephalic, PERLA  Neck: supple, no JVD. No cervical lymphadenopathy.  Chest: Mild scattered wheezing, no rales or rhonchi CVS: S1 S2 regular, no murmurs.  Abdomen: Morbidly obese soft, NBS, NT, ND, no gaurding, rigidity or rebound. Extremities: no cyanosis or clubbing, B/L Lower Ext shows 1+ edema Neurology: Awake alert, and oriented X 3, CN II-XII intact, Non focal Skin: No Rash or lesions Wounds:N/A    Data Review   Basic Metabolic Panel: No results found for this basename: NA, K, CL, CO2, GLUCOSE, BUN, CREATININE, CALCIUM, MG, PHOS,  in the last 168 hours Liver Function Tests: No results found for this basename: AST, ALT, ALKPHOS, BILITOT, PROT, ALBUMIN,   in the last 168 hours  CBC: No results found for this basename: WBC, NEUTROABS, HGB, HCT, MCV, PLT,  in the last 168 hours  ------------------------------------------------------------------------------------------------------------------ No results found for this basename: HGBA1C,  in the last 72 hours ------------------------------------------------------------------------------------------------------------------ No results found for this basename: CHOL, HDL, LDLCALC, TRIG, CHOLHDL, LDLDIRECT,  in the last 72 hours ------------------------------------------------------------------------------------------------------------------ No results found for this basename: TSH, T4TOTAL, FREET3, T3FREE, THYROIDAB,  in the last 72 hours ------------------------------------------------------------------------------------------------------------------ No results found for this basename: VITAMINB12, FOLATE, FERRITIN, TIBC, IRON, RETICCTPCT,  in the last 72 hours  Coagulation profile  No results found for this basename: INR, PROTIME,  in the last 168 hours    Assessment & Plan   Active Problems: Alpha-1 antitrypsin deficiency with chronic asthma - Continue albuterol nebs, inhaler as needed, prednisone -Ambulatory referral to pulmonology sent  Uncontrolled diabetes mellitus: Likely secondary to prednisone - Added Amaryl 2 mg daily, patient was only taking metformin 1000 mg BID  Accelerated hypertension - Given clonidine 0.2 mg x1 - Placed on losartan/HCTZ 100/25 mg daily - Placed on Lasix 20 mg daily as needed for pedal edema. Patient wants to cut down on her salt eating  Chronic shoulder pain and knee pain - Explained to the patient that she'll not be receiving any prescription for Percocets refills -Gave prescription for tramadol and Flexeril as needed X-ray of the left knee shows joint effusion, ambulatory referral to orthopedics and  Morbid obesity: - Patient wondered for diet and  weight control, ambulatory referral for bariatric surgery for breast reduction  Health screening : Pap smear done this year,  -Mammogram ordered     Follow-up in 27month     RAI,RIPUDEEP M.D. 11/07/2013, 3:42 PM

## 2013-11-23 ENCOUNTER — Encounter: Payer: Self-pay | Admitting: Obstetrics & Gynecology

## 2013-12-27 ENCOUNTER — Emergency Department (INDEPENDENT_AMBULATORY_CARE_PROVIDER_SITE_OTHER)
Admission: EM | Admit: 2013-12-27 | Discharge: 2013-12-27 | Disposition: A | Discharge status: Home / Self Care | Payer: Self-pay | Source: Home / Self Care | Attending: Emergency Medicine | Admitting: Emergency Medicine

## 2013-12-27 ENCOUNTER — Encounter (HOSPITAL_COMMUNITY): Payer: Self-pay | Admitting: Emergency Medicine

## 2013-12-27 DIAGNOSIS — K0401 Reversible pulpitis: Secondary | ICD-10-CM

## 2013-12-27 DIAGNOSIS — A084 Viral intestinal infection, unspecified: Secondary | ICD-10-CM

## 2013-12-27 DIAGNOSIS — J441 Chronic obstructive pulmonary disease with (acute) exacerbation: Secondary | ICD-10-CM

## 2013-12-27 DIAGNOSIS — A088 Other specified intestinal infections: Secondary | ICD-10-CM

## 2013-12-27 LAB — POCT URINALYSIS DIP (DEVICE)
Bilirubin Urine: NEGATIVE
GLUCOSE, UA: NEGATIVE mg/dL
Ketones, ur: NEGATIVE mg/dL
NITRITE: NEGATIVE
Protein, ur: 30 mg/dL — AB
SPECIFIC GRAVITY, URINE: 1.02 (ref 1.005–1.030)
Urobilinogen, UA: 1 mg/dL (ref 0.0–1.0)
pH: 7 (ref 5.0–8.0)

## 2013-12-27 LAB — POCT PREGNANCY, URINE: Preg Test, Ur: NEGATIVE

## 2013-12-27 MED ORDER — OXYCODONE-ACETAMINOPHEN 5-325 MG PO TABS: ORAL_TABLET | ORAL | Status: DC

## 2013-12-27 MED ORDER — ONDANSETRON HCL 8 MG PO TABS: 8.0000 mg | ORAL_TABLET | Freq: Three times a day (TID) | ORAL | Status: DC | PRN

## 2013-12-27 MED ORDER — HYDROCODONE-ACETAMINOPHEN 5-325 MG PO TABS
2.0000 | ORAL_TABLET | Freq: Once | ORAL | Status: AC
  Administered 2013-12-27: 2 via ORAL

## 2013-12-27 MED ORDER — DOXYCYCLINE HYCLATE 100 MG PO TABS: 100.0000 mg | ORAL_TABLET | Freq: Two times a day (BID) | ORAL | Status: DC

## 2013-12-27 MED ORDER — GUAIFENESIN-CODEINE 100-10 MG/5ML PO SYRP: 10.0000 mL | ORAL_SOLUTION | Freq: Four times a day (QID) | ORAL | Status: DC | PRN

## 2013-12-27 MED ORDER — BECLOMETHASONE DIPROPIONATE 80 MCG/ACT IN AERS: 2.0000 | INHALATION_SPRAY | Freq: Two times a day (BID) | RESPIRATORY_TRACT | Status: DC

## 2013-12-27 MED ORDER — HYDROCODONE-ACETAMINOPHEN 5-325 MG PO TABS
ORAL_TABLET | ORAL | Status: AC
  Filled 2013-12-27: qty 2

## 2013-12-27 NOTE — Discharge Instructions (Signed)
You have been diagnosed with gastroenteritis.  This can be caused by a virus or a bacteria.  Viral infections can last from less than a day to a week.  If your symptoms last more than a week, a bacterial infection is more likely.  Either way, you must assume you are contagious and take infectious precautions.  If you work in food preparation, you should stay out of work.  Likewise, you should not prepare food for your family.  Practice frequent hand washing.  Hand sanitizer does not reliably kill the virus.  Wash your hands after you use the bathroom, touch your mouth or face, and before contact with anyone.  Do not kiss anyone and do not let anyone eat or drink after you.  For right now, we recommend taking only clear liquids.  This would include things like Gator Aid or other sports drinks, tea, water, ice chips, clear juices, ginger ale, Seven-Up, Sprite, Pedialyte, jello, clear broth--anything you can see through and applesauce.  You should do this for at least 24 hours, perhaps longer.  We recommend small sips at a time.  Sometimes drinking a large amount will cause you to be nauseated and you will vomit it back up.  Sometimes it helps to have this chilled or drink it over ice chips.  Once your stomach settles down a little, you can advance to a very light diet.  We have a diet called the b.r.a.t. Diet which stands for the following:  Bananas  Rice  Apple sauce (not apple juice)  Toast or crackers.  If diarrhea becomes a problem, you may try Imodeum unless your doctor tells you not to. You can take up to 4 per day or 1 every 6 hours.  Stick with this for about 24 hours, then you may advance to a more regular diet, but your stomach will be sensitive for 5 to 7 days, so it would be a good idea to avoid heavy, greasy, fried, or spicey foods.    You should return if:  You symptoms are not better in 3 days or they have gone on for 7 days total.  You have severe symptoms of high fever or severe  abdominal pain.  You feel you are getting dehydrated with dizziness, weakness, muscle cramps, or severe fatigue.  You have blood in your vomitus or stool.  This includes black discoloration of your vomitus or stool.  But remember that Pepto Bismol can cause black stools.    Look up the [facility]'s [facility]ions [facility] for free dental clinics. https://www.williams-garcia.[email or web address]://www.ncdental.org/ncds/Schedule.asp  Get there early and be prepared to wait. [employer] and [employer] have Armed forces operational officerdental hygienist schools that provide low cost routine dental care.   Other resources: [facility] [address] [place], [place] [phone]  Patients with Medicaid: [facility]                     [facility] [phone]                                [phone] Phone:  [phone]  Phone:  [phone]  If unable to pay or uninsured, contact:  [facility] or [facility]. to become qualified for the adult dental clinic.  No matter what dental problem you have, it will not get better unless you get good dental care.  If the tooth is not taken care of, your symptoms will come back in time and you will be visiting [place] again in the Urgent Care Center with a bad toothache.  So, see your dentist as soon as possible.  If you don't have a dentist, we can give you a list of dentists.  Sometimes the most cost effective treatment is removal of the tooth.  This can be done very inexpensively through one of the low cost Runner, broadcasting/film/video such as the facility on [facility] in [place] [phone]).  The downside to this is that you will have one less tooth and this can effect your ability to chew.  Some other things that can be done for a dental infection include the following:   Rinse your mouth out with hot salt water (1/2 tsp of table salt and a pinch of baking soda in 8 oz of hot water).  You  can do this every 2 or 3 hours.  Avoid cold foods, beverages, and cold air.  This will make your symptoms worse.  Sleep with your head elevated.  Sleeping flat will cause your gums and oral tissues to swell and make them hurt more.  You can sleep on several pillows.  Even better is to sleep in a recliner with your head higher than your heart.  For mild to moderate pain, you can take Tylenol, ibuprofen, or Aleve.  External application of heat by a heating pad, hot water bottle, or hot wet towel can help with pain and speed healing.  You can do this every 2 to 3 hours. Do not fall asleep on a heating pad since this can cause a burn.   Go to www.goodrx.com to look up your medications. This will give you a list of where you can find your prescriptions at the most affordable prices.   RESOURCE GUIDE  Dental Problems  Look up DebtSupply.pl.asp for a schedule of the [facility] [facility] [employer]'s free dental clinics called [address] [place]. They have clinics all around [place]. Get there early and be prepared to wait.   [facility] [address]  [place], [place] [address] [phone]  [facility] [address] [place], [place] [phone]  Patients with Medicaid: [facility] [phone] [address]                                [phone] Phone:  [phone]                                                  Phone:  [phone]  If unable to pay or uninsured, contact:  [facility] or [facility]. to become qualified for the adult dental clinic.

## 2013-12-27 NOTE — ED Notes (Signed)
Pt is here w/sig other who is driving.

## 2013-12-27 NOTE — ED Notes (Signed)
Pt c/o dry cough onset 1 week and vomiting... Has had 6 episodes of vomiting today sxs also include fevers... Denies: diarrhea Also c/o dental pain onset 3 days She is alert w/no signs of acute distress.

## 2013-12-27 NOTE — ED Provider Notes (Signed)
Chief Complaint   Chief Complaint  Patient presents with  . Cough    History of Present Illness   Carla Hicks is a 43 year old female who presents with a number of problems: Nausea and vomiting, cough, and toothache. The nausea and vomiting began today. She denies any hematemesis, coffee-ground emesis, or bilious emesis. She's vomited about 3 times. She denies any diarrhea. She has had some crampy periumbilical pain and subjective fever. For the past week she's had a cough productive yellow sputum, chest tightness, and wheezing. She denies any chest pain, nasal congestion, rhinorrhea, or sore throat. For the past 3 days she's had a toothache in her left, lower, wisdom tooth. This tooth is broken off. There is no gingival swelling, difficulty swallowing, or breathing. She has a history of asthma alpha-1 antitrypsin deficiency. She is on 60 mg of prednisone a day.  Review of Systems     Other than as noted above, the patient denies any of the following symptoms: Systemic:  No fever, chills, sweats, fatigue, myalgias, headache, or anorexia. Eye:  No redness, pain or drainage. ENT:  No earache, nasal congestion, rhinorrhea, sinus pressure, or sore throat. Lungs:  No cough, sputum production, wheezing, shortness of breath.  Cardiovascular:  No chest pain, palpitations, or syncope. GI:  No nausea, vomiting, abdominal pain or diarrhea. GU:  No dysuria, frequency, or hematuria. Skin:  No rash or pruritis.   PMFSH     Past medical history, family history, social history, meds, and allergies were reviewed.  She is allergic to penicillin. Current meds include metformin, Bactrim, prednisone, hydrochlorothiazide, albuterol, and Percocet. Current medical problems include urinary tract infection, hypertension, diabetes, and chronic shoulder pain.  Physical Examination    Vital signs:  BP 159/102  Pulse 106  Temp(Src) 98.4 F (36.9 C) (Oral)  Resp 16  SpO2 99%  LMP 11/13/2013 General:   Alert, in no distress. Eye:  PERRL, full EOMs.  Lids and conjunctivas were normal. ENT:  TMs and canals were normal, without erythema or inflammation.  Nasal mucosa was clear and uncongested, without drainage.  Mucous membranes were moist.  Pharynx was clear, without exudate or drainage.  There were no oral ulcerations or lesions. She has multiple carious teeth. The left, lower, wisdom tooth is decayed. There is no swelling the gingiva or the floor the mouth. Neck:  Supple, no adenopathy, tenderness or mass. Thyroid was normal. Lungs:  No respiratory distress.  She has scattered expiratory wheezes, no rales or rhonchi. Heart:  Regular rhythm, without gallops, murmers or rubs. Abdomen:  Soft, flat, and non-tender to palpation.  No hepatosplenomagaly or mass. Skin:  Clear, warm, and dry, without rash or lesions.  Labs    Results for orders placed during the hospital encounter of 12/27/13  POCT URINALYSIS DIP (DEVICE)      Result Value Range   Glucose, UA NEGATIVE  NEGATIVE mg/dL   Bilirubin Urine NEGATIVE  NEGATIVE   Ketones, ur NEGATIVE  NEGATIVE mg/dL   Specific Gravity, Urine 1.020  1.005 - 1.030   Hgb urine dipstick LARGE (*) NEGATIVE   pH 7.0  5.0 - 8.0   Protein, ur 30 (*) NEGATIVE mg/dL   Urobilinogen, UA 1.0  0.0 - 1.0 mg/dL   Nitrite NEGATIVE  NEGATIVE   Leukocytes, UA SMALL (*) NEGATIVE  POCT PREGNANCY, URINE      Result Value Range   Preg Test, Ur NEGATIVE  NEGATIVE    Course in Urgent Care Center    And she  was given Norco 5/325 2 for pain.  Assessment   The primary encounter diagnosis was Viral gastroenteritis. Diagnoses of Pulpitis and COPD with acute exacerbation were also pertinent to this visit.  Plan     1.  Meds:  The following meds were prescribed:   Discharge Medication List as of 12/27/2013  3:42 PM    START taking these medications   Details  beclomethasone (QVAR) 80 MCG/ACT inhaler Inhale 2 puffs into the lungs 2 (two) times daily., Starting 12/27/2013,  Until Discontinued, Normal    doxycycline (VIBRA-TABS) 100 MG tablet Take 1 tablet (100 mg total) by mouth 2 (two) times daily., Starting 12/27/2013, Until Discontinued, Normal    guaiFENesin-codeine (GUIATUSS AC) 100-10 MG/5ML syrup Take 10 mLs by mouth 4 (four) times daily as needed for cough., Starting 12/27/2013, Until Discontinued, Print    ondansetron (ZOFRAN) 8 MG tablet Take 1 tablet (8 mg total) by mouth every 8 (eight) hours as needed for nausea or vomiting., Starting 12/27/2013, Until Discontinued, Normal    oxyCODONE-acetaminophen (PERCOCET) 5-325 MG per tablet 1 to 2 tablets every 6 hours as needed for pain., Print        2.  Patient Education/Counseling:  The patient was given appropriate handouts, self care instructions, and instructed in symptomatic relief.   3.  Follow up:  The patient was told to follow up here if no better in 3 to 4 days, or sooner if becoming worse in any way, and given some red flag symptoms such as fever or difficulty breathing which would prompt immediate return.  Follow up here if necessary.        Reuben Likes, MD 12/27/13 2214

## 2014-01-17 ENCOUNTER — Ambulatory Visit: Payer: Self-pay | Admitting: Internal Medicine

## 2014-03-27 ENCOUNTER — Encounter: Payer: Self-pay | Admitting: Internal Medicine

## 2014-03-27 ENCOUNTER — Ambulatory Visit: Payer: Self-pay | Attending: Internal Medicine | Admitting: Internal Medicine

## 2014-03-27 VITALS — BP 145/95 | HR 96 | Temp 98.5°F | Resp 18 | Ht 64.0 in | Wt 258.0 lb

## 2014-03-27 DIAGNOSIS — I10 Essential (primary) hypertension: Secondary | ICD-10-CM

## 2014-03-27 DIAGNOSIS — E119 Type 2 diabetes mellitus without complications: Secondary | ICD-10-CM

## 2014-03-27 DIAGNOSIS — E8801 Alpha-1-antitrypsin deficiency: Secondary | ICD-10-CM

## 2014-03-27 LAB — CBC WITH DIFFERENTIAL/PLATELET
BASOS ABS: 0 10*3/uL (ref 0.0–0.1)
Basophils Relative: 0 % (ref 0–1)
EOS ABS: 0 10*3/uL (ref 0.0–0.7)
EOS PCT: 0 % (ref 0–5)
HEMATOCRIT: 42.2 % (ref 36.0–46.0)
Hemoglobin: 14.6 g/dL (ref 12.0–15.0)
Lymphocytes Relative: 43 % (ref 12–46)
Lymphs Abs: 4.3 10*3/uL — ABNORMAL HIGH (ref 0.7–4.0)
MCH: 30.5 pg (ref 26.0–34.0)
MCHC: 34.6 g/dL (ref 30.0–36.0)
MCV: 88.1 fL (ref 78.0–100.0)
Monocytes Absolute: 0.6 10*3/uL (ref 0.1–1.0)
Monocytes Relative: 6 % (ref 3–12)
Neutro Abs: 5 10*3/uL (ref 1.7–7.7)
Neutrophils Relative %: 51 % (ref 43–77)
PLATELETS: 327 10*3/uL (ref 150–400)
RBC: 4.79 MIL/uL (ref 3.87–5.11)
RDW: 14.1 % (ref 11.5–15.5)
WBC: 9.9 10*3/uL (ref 4.0–10.5)

## 2014-03-27 LAB — LIPID PANEL
CHOLESTEROL: 200 mg/dL (ref 0–200)
HDL: 97 mg/dL (ref >39)
LDL Cholesterol: 47 mg/dL (ref 0–99)
TRIGLYCERIDES: 282 mg/dL — AB (ref <150)
Total CHOL/HDL Ratio: 2.1 Ratio
VLDL: 56 mg/dL — AB (ref 0–40)

## 2014-03-27 LAB — COMPLETE METABOLIC PANEL WITH GFR
ALT: 16 U/L (ref 0–35)
AST: 9 U/L (ref 0–37)
Albumin: 3.8 g/dL (ref 3.5–5.2)
Alkaline Phosphatase: 53 U/L (ref 39–117)
BUN: 11 mg/dL (ref 6–23)
CO2: 24 mEq/L (ref 19–32)
CREATININE: 0.63 mg/dL (ref 0.50–1.10)
Calcium: 9.7 mg/dL (ref 8.4–10.5)
Chloride: 101 mEq/L (ref 96–112)
GFR, Est Non African American: >89 mL/min
Glucose, Bld: 185 mg/dL — ABNORMAL HIGH (ref 70–99)
Potassium: 4.3 mEq/L (ref 3.5–5.3)
Sodium: 139 mEq/L (ref 135–145)
Total Bilirubin: 0.6 mg/dL (ref 0.2–1.2)
Total Protein: 6.8 g/dL (ref 6.0–8.3)

## 2014-03-27 LAB — POCT GLYCOSYLATED HEMOGLOBIN (HGB A1C): Hemoglobin A1C: 9

## 2014-03-27 LAB — TSH: TSH: 0.834 u[IU]/mL (ref 0.350–4.500)

## 2014-03-27 MED ORDER — CALCIUM CARBONATE-VITAMIN D 500-200 MG-UNIT PO TABS: 1.0000 | ORAL_TABLET | Freq: Three times a day (TID) | ORAL | Status: DC

## 2014-03-27 MED ORDER — PREDNISONE 20 MG PO TABS: 20.0000 mg | ORAL_TABLET | Freq: Every day | ORAL | Status: DC

## 2014-03-27 MED ORDER — GLIMEPIRIDE 2 MG PO TABS: 2.0000 mg | ORAL_TABLET | Freq: Every day | ORAL | Status: DC

## 2014-03-27 MED ORDER — METFORMIN HCL ER (MOD) 1000 MG PO TB24
1000.0000 mg | ORAL_TABLET | Freq: Two times a day (BID) | ORAL | Status: DC
Start: 2014-03-27 — End: 2014-04-10

## 2014-03-27 MED ORDER — ALBUTEROL SULFATE HFA 108 (90 BASE) MCG/ACT IN AERS: 2.0000 | INHALATION_SPRAY | Freq: Four times a day (QID) | RESPIRATORY_TRACT | Status: DC | PRN

## 2014-03-27 MED ORDER — ALBUTEROL SULFATE (5 MG/ML) 0.5% IN NEBU: 2.5000 mg | INHALATION_SOLUTION | RESPIRATORY_TRACT | Status: DC | PRN

## 2014-03-27 MED ORDER — LOSARTAN POTASSIUM-HCTZ 100-25 MG PO TABS
1.0000 | ORAL_TABLET | Freq: Every day | ORAL | Status: DC
Start: 2014-03-27 — End: 2014-10-24

## 2014-03-27 MED ORDER — TRAMADOL HCL 50 MG PO TABS: 100.0000 mg | ORAL_TABLET | Freq: Three times a day (TID) | ORAL | Status: DC | PRN

## 2014-03-27 MED ORDER — PREDNISONE 20 MG PO TABS: 60.0000 mg | ORAL_TABLET | Freq: Every day | ORAL | Status: DC

## 2014-03-27 MED ORDER — BECLOMETHASONE DIPROPIONATE 80 MCG/ACT IN AERS: 2.0000 | INHALATION_SPRAY | Freq: Two times a day (BID) | RESPIRATORY_TRACT | Status: DC

## 2014-03-27 NOTE — Progress Notes (Signed)
Patient ID: Carla Hicks, female   DOB: 01-18-1971, 43 y.o.   MRN: [id number]   CC:  HPI: 43 year old female diagnosed with alpha 1 antitrypsin deficiency several years ago, previously followed by a pulmonologist at [place] who placed her on high-dose prednisone and 120 mg a day. The patient self tapered herself to 60 mg a day and has been taking this dose for the last 3 years. The patient has now developed morbid obesity hypertension diabetes. She has not seen her pulmonologist in 6 years. She needs refills on all her medications including medications for diabetes and hypertension. She states that she had some lightheadedness although he came and took her mother's blood pressure medication. At that time her blood pressure was in the 150s. She denies any headache or lightheadedness today  She was recently evaluated in [facility] in October 2014 for abnormal vaginal bleeding and suprapubic pain.The patient's bleeding typically only follows coitus and does not regularly show up in underwear, on pads, etc. The patient did experience bright red bleeding and "what felt like labor pains" Last day of regular LMP around 09-30-13. No condom or OC use. No new sexual partners. Patient screened for STDs annually at health dept with last visit 10/2012. Transvaginal ultrasound on 10/18/13 showed suspected hemorrhage within the endometrial cavity, she has not had a Pap smear in a very long time  Patient requesting to be started on a medication for vaginal bleeding.    Allergies  Allergen Reactions  . Penicillins Hives   Past Medical History  Diagnosis Date  . Diabetes mellitus   . Hypertension   . Urinary bladder disorder   . Asthma   . Emphysema    Current Outpatient Prescriptions on File Prior to Visit  Medication Sig Dispense Refill  . cyclobenzaprine (FLEXERIL) 5 MG tablet Take 1 tablet (5 mg total) by mouth 2 (two) times daily as needed for muscle spasms.  60 tablet  1  . furosemide  (LASIX) 20 MG tablet Take 1 tablet (20 mg total) by mouth as needed for fluid or edema.  30 tablet  3  . guaiFENesin-codeine (GUIATUSS AC) 100-10 MG/5ML syrup Take 10 mLs by mouth 4 (four) times daily as needed for cough.  120 mL  0  . Multiple Vitamin (MULTIVITAMIN WITH MINERALS) TABS tablet Take 1 tablet by mouth daily.      . ondansetron (ZOFRAN) 8 MG tablet Take 1 tablet (8 mg total) by mouth every 8 (eight) hours as needed for nausea or vomiting.  20 tablet  0  . oxyCODONE-acetaminophen (PERCOCET) 10-325 MG per tablet Take 1 tablet by mouth every 8 (eight) hours as needed for pain.  60 tablet  0  . oxyCODONE-acetaminophen (PERCOCET) 5-325 MG per tablet 1 to 2 tablets every 6 hours as needed for pain.  20 tablet  0   No current facility-administered medications on file prior to visit.   No family history on file. History   Social History  . Marital Status: Legally Separated    Spouse Name: N/A    Number of Children: N/A  . Years of Education: N/A   Occupational History  . Not on file.   Social History Main Topics  . Smoking status: Former Games developer  . Smokeless tobacco: Not on file  . Alcohol Use: No  . Drug Use: No  . Sexual Activity: Yes    Birth Control/ Protection: None   Other Topics Concern  . Not on file   Social History Narrative  .  No narrative on file    Review of Systems  Constitutional: Negative for fever, chills, diaphoresis, activity change, appetite change and fatigue.  HENT: Negative for ear pain, nosebleeds, congestion, facial swelling, rhinorrhea, neck pain, neck stiffness and ear discharge.   Eyes: Negative for pain, discharge, redness, itching and visual disturbance.  Respiratory: Negative for cough, choking, chest tightness, shortness of breath, wheezing and stridor.   Cardiovascular: Negative for chest pain, palpitations and leg swelling.  Gastrointestinal: Negative for abdominal distention.  Genitourinary: As in history of present  illness Musculoskeletal: Negative for back pain, joint swelling, arthralgias and gait problem.  Neurological: Negative for dizziness, tremors, seizures, syncope, facial asymmetry, speech difficulty, weakness, light-headedness, numbness and headaches.  Hematological: Negative for adenopathy. Does not bruise/bleed easily.  Psychiatric/Behavioral: Negative for hallucinations, behavioral problems, confusion, dysphoric mood, decreased concentration and agitation.    Objective:   Filed Vitals:   03/27/14 1104  BP: 145/95  Pulse: 96  Temp: 98.5 F (36.9 C)  Resp: 18    Physical Exam  Constitutional: Appears well-developed and well-nourished. No distress.  HENT: Normocephalic. External right and left ear normal. Oropharynx is clear and moist.  Eyes: Conjunctivae and EOM are normal. PERRLA, no scleral icterus.  Neck: Normal ROM. Neck supple. No JVD. No tracheal deviation. No thyromegaly.  CVS: RRR, S1/S2 +, no murmurs, no gallops, no carotid bruit.  Pulmonary: Effort and breath sounds normal, no stridor, rhonchi, wheezes, rales.  Abdominal: Soft. BS +,  no distension, tenderness, rebound or guarding.  Musculoskeletal: Normal range of motion. No edema and no tenderness.  Lymphadenopathy: No lymphadenopathy noted, cervical, inguinal. Neuro: Alert. Normal reflexes, muscle tone coordination. No cranial nerve deficit. Skin: Skin is warm and dry. No rash noted. Not diaphoretic. No erythema. No pallor.  Psychiatric: Normal mood and affect. Behavior, judgment, thought content normal.   Lab Results  Component Value Date   WBC 8.9 10/18/2013   HGB 13.1 10/18/2013   HCT 37.3 10/18/2013   MCV 86.5 10/18/2013   PLT 270 10/18/2013   Lab Results  Component Value Date   CREATININE 0.56 10/18/2013   BUN 7 10/18/2013   NA 137 10/18/2013   K 3.5 10/18/2013   CL 99 10/18/2013   CO2 29 10/18/2013    Lab Results  Component Value Date   HGBA1C 8.8 10/17/2013   Lipid Panel     Component Value  Date/Time   CHOL 194 10/17/2013 1119   TRIG 310* 10/17/2013 1119   HDL 68 10/17/2013 1119   CHOLHDL 2.9 10/17/2013 1119   VLDL 62* 10/17/2013 1119   LDLCALC 64 10/17/2013 1119       Assessment and plan:   Patient Active Problem List   Diagnosis Date Noted  . Knee pain 11/07/2013  . Morbid obesity 11/07/2013  . Preventative health care 10/17/2013  . AAT (alpha-1-antitrypsin) deficiency 05/31/2013  . Unspecified essential hypertension 05/31/2013  . Unspecified asthma, with exacerbation 05/31/2013  . Type II or unspecified type diabetes mellitus without mention of complication, uncontrolled 05/31/2013       Alpha-1 antitrypsin deficiency/emphysema Patient states that she is on 60 mg a day of prednisone No pulmonary symptoms at this point She is also on Qvar and albuterol She has not seen a pulmonologist in 6 years Urgent pulmonology referral to review treatment of her alpha-1 antitrypsin deficiency because of long-term dependence on chronic steroids  Hypertension Somewhat elevated blood pressure but the patient is also on high-dose prednisone Continue hydrochlorothiazide Renal panel   Diabetes Continue metformin,  check A1c  Vaginal bleeding Patient was seen by [facility] in October 2014 Patient diagnosed with postcoital bleeding. but the patient now describes menorrhagia Gynecology referral provided, may need endometrial biopsy, evaluated for dub  We'll check a CBC, TSH   Follow up in 2 months   The patient was given clear instructions to go to ER or return to medical center if symptoms don't improve, worsen or new problems develop. The patient verbalized understanding. The patient was told to call to get any lab results if not heard anything in the next week.

## 2014-03-27 NOTE — Patient Instructions (Signed)
Patient to provide contact information of her pulmonologist in [place] so that records can be obtained

## 2014-03-28 ENCOUNTER — Encounter: Payer: Self-pay | Admitting: Obstetrics & Gynecology

## 2014-03-28 LAB — VITAMIN D 25 HYDROXY (VIT D DEFICIENCY, FRACTURES): Vit D, 25-Hydroxy: 17 ng/mL — ABNORMAL LOW (ref 30–89)

## 2014-03-30 MED ORDER — VITAMIN D (ERGOCALCIFEROL) 1.25 MG (50000 UNIT) PO CAPS: 50000.0000 [IU] | ORAL_CAPSULE | ORAL | Status: DC

## 2014-03-30 MED ORDER — SIMVASTATIN 20 MG PO TABS: 20.0000 mg | ORAL_TABLET | Freq: Every day | ORAL | Status: DC

## 2014-03-30 NOTE — Addendum Note (Signed)
Addended by: Susie Cassette MD, Germain Osgood on: 03/30/2014 03:39 PM   Modules accepted: Orders

## 2014-04-02 ENCOUNTER — Telehealth: Payer: Self-pay | Admitting: Emergency Medicine

## 2014-04-02 NOTE — Telephone Encounter (Signed)
Message copied by Darlis Loan on Mon Apr 02, 2014  5:13 PM ------      Message from: Susie Cassette MD, [facility]      Created: Fri Mar 30, 2014  3:40 PM       Notify patient that A1c is 9.0 which is slightly higher than her last A1c.      Triglycerides are 282. The patient has been started on simvastatin 20 mg, prescription sent to [facility] pharmacy.      With the level of 17. Prescription sent to [facility] pharmacy ------

## 2014-04-02 NOTE — Telephone Encounter (Signed)
Pt given lab results with medication instructions to start taking prescribed Simvastatin 20 mg daily Pt also c/o re occurrent UTI. Requesting refill Bactrim Informed pt she will need to come in tomorrow for repeat urine

## 2014-04-10 ENCOUNTER — Other Ambulatory Visit: Payer: Self-pay | Admitting: Emergency Medicine

## 2014-04-10 MED ORDER — METFORMIN HCL 1000 MG PO TABS: 1000.0000 mg | ORAL_TABLET | Freq: Two times a day (BID) | ORAL | Status: DC

## 2014-04-18 ENCOUNTER — Telehealth: Payer: Self-pay | Admitting: *Deleted

## 2014-04-18 DIAGNOSIS — M25569 Pain in unspecified knee: Secondary | ICD-10-CM
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
## 2014-04-24 ENCOUNTER — Institutional Professional Consult (permissible substitution): Payer: Self-pay | Admitting: Pulmonary Disease

## 2014-04-26 ENCOUNTER — Encounter: Payer: Self-pay | Admitting: Obstetrics & Gynecology

## 2014-07-19 ENCOUNTER — Encounter (HOSPITAL_COMMUNITY): Payer: Self-pay | Admitting: Emergency Medicine

## 2014-07-19 ENCOUNTER — Emergency Department (INDEPENDENT_AMBULATORY_CARE_PROVIDER_SITE_OTHER)
Admission: EM | Admit: 2014-07-19 | Discharge: 2014-07-19 | Disposition: A | Discharge status: Home / Self Care | Payer: Self-pay | Source: Home / Self Care | Attending: Family Medicine | Admitting: Family Medicine

## 2014-07-19 DIAGNOSIS — N390 Urinary tract infection, site not specified: Secondary | ICD-10-CM

## 2014-07-19 LAB — POCT I-STAT, CHEM 8
BUN: 6 mg/dL (ref 6–23)
CALCIUM ION: 1.17 mmol/L (ref 1.12–1.23)
CREATININE: 0.6 mg/dL (ref 0.50–1.10)
Chloride: 106 mEq/L (ref 96–112)
GLUCOSE: 201 mg/dL — AB (ref 70–99)
HEMATOCRIT: 43 % (ref 36.0–46.0)
HEMOGLOBIN: 14.6 g/dL (ref 12.0–15.0)
POTASSIUM: 4.2 meq/L (ref 3.7–5.3)
Sodium: 138 mEq/L (ref 137–147)
TCO2: 21 mmol/L (ref 0–100)

## 2014-07-19 LAB — POCT URINALYSIS DIP (DEVICE)
BILIRUBIN URINE: NEGATIVE
GLUCOSE, UA: NEGATIVE mg/dL
Ketones, ur: NEGATIVE mg/dL
NITRITE: POSITIVE — AB
PH: 5.5 (ref 5.0–8.0)
Protein, ur: 30 mg/dL — AB
SPECIFIC GRAVITY, URINE: 1.025 (ref 1.005–1.030)
Urobilinogen, UA: 0.2 mg/dL (ref 0.0–1.0)

## 2014-07-19 LAB — POCT PREGNANCY, URINE: Preg Test, Ur: NEGATIVE

## 2014-07-19 MED ORDER — NITROFURANTOIN MONOHYD MACRO 100 MG PO CAPS: 100.0000 mg | ORAL_CAPSULE | Freq: Two times a day (BID) | ORAL | Status: DC

## 2014-07-19 NOTE — Discharge Instructions (Signed)
Thank you for coming in today. If your belly pain worsens, or you have high fever, bad vomiting, blood in your stool or black tarry stool go to the Emergency Room.  Follow up with your doctor.  Urinary Tract Infection Urinary tract infections (UTIs) can develop anywhere along your urinary tract. Your urinary tract is your body's drainage system for removing wastes and extra water. Your urinary tract includes two kidneys, two ureters, a bladder, and a urethra. Your kidneys are a pair of bean-shaped organs. Each kidney is about the size of your fist. They are located below your ribs, one on each side of your spine. CAUSES Infections are caused by microbes, which are microscopic organisms, including fungi, viruses, and bacteria. These organisms are so small that they can only be seen through a microscope. Bacteria are the microbes that most commonly cause UTIs. SYMPTOMS  Symptoms of UTIs may vary by age and gender of the patient and by the location of the infection. Symptoms in young women typically include a frequent and intense urge to urinate and a painful, burning feeling in the bladder or urethra during urination. Older women and men are more likely to be tired, shaky, and weak and have muscle aches and abdominal pain. A fever may mean the infection is in your kidneys. Other symptoms of a kidney infection include pain in your back or sides below the ribs, nausea, and vomiting. DIAGNOSIS To diagnose a UTI, your caregiver will ask you about your symptoms. Your caregiver also will ask to provide a urine sample. The urine sample will be tested for bacteria and white blood cells. White blood cells are made by your body to help fight infection. TREATMENT  Typically, UTIs can be treated with medication. Because most UTIs are caused by a bacterial infection, they usually can be treated with the use of antibiotics. The choice of antibiotic and length of treatment depend on your symptoms and the type of  bacteria causing your infection. HOME CARE INSTRUCTIONS  If you were prescribed antibiotics, take them exactly as your caregiver instructs you. Finish the medication even if you feel better after you have only taken some of the medication.  Drink enough water and fluids to keep your urine clear or pale yellow.  Avoid caffeine, tea, and carbonated beverages. They tend to irritate your bladder.  Empty your bladder often. Avoid holding urine for long periods of time.  Empty your bladder before and after sexual intercourse.  After a bowel movement, women should cleanse from front to back. Use each tissue only once. SEEK MEDICAL CARE IF:   You have back pain.  You develop a fever.  Your symptoms do not begin to resolve within 3 days. SEEK IMMEDIATE MEDICAL CARE IF:   You have severe back pain or lower abdominal pain.  You develop chills.  You have nausea or vomiting.  You have continued burning or discomfort with urination. MAKE SURE YOU:   Understand these instructions.  Will watch your condition.  Will get help right away if you are not doing well or get worse. Document Released: 09/16/2005 Document Revised: 06/07/2012 Document Reviewed: 01/15/2012 [facility] Patient Information 2015 [place], [place]. This information is not intended to replace advice given to you by your health care provider. Make sure you discuss any questions you have with your health care provider.

## 2014-07-19 NOTE — ED Notes (Signed)
C/o  Feeling light headed/dizzy x 5 days.   Lower abdominal pain.    States no mentrual cycle since June 5.   Denies any hx of irregular cycles.  No otc meds taken for symptoms.

## 2014-07-19 NOTE — ED Provider Notes (Signed)
Carla Hicks is a 43 y.o. female who presents to Urgent Care today for mild abdominal pain and fatigue. Symptoms present for the last several days. Patient has urinary frequency and urgency. She's not had a menstrual cycle in over a month. She's not tried any medications. She has mild dizziness as well. No chest pains or palpitations.   Past Medical History  Diagnosis Date  . Diabetes mellitus   . Hypertension   . Urinary bladder disorder   . Asthma   . Emphysema    History  Substance Use Topics  . Smoking status: Former Games developer  . Smokeless tobacco: Not on file  . Alcohol Use: No   ROS as above Medications: No current facility-administered medications for this encounter.   Current Outpatient Prescriptions  Medication Sig Dispense Refill  . glimepiride (AMARYL) 2 MG tablet Take 1 tablet (2 mg total) by mouth daily before breakfast.  30 tablet  6  . losartan-hydrochlorothiazide (HYZAAR) 100-25 MG per tablet Take 1 tablet by mouth daily.  30 tablet  3  . metFORMIN (GLUCOPHAGE) 1000 MG tablet Take 1 tablet (1,000 mg total) by mouth 2 (two) times daily with a meal.  180 tablet  3  . predniSONE (DELTASONE) 20 MG tablet Take 3 tablets (60 mg total) by mouth daily.  180 tablet  3  . albuterol (PROVENTIL HFA;VENTOLIN HFA) 108 (90 BASE) MCG/ACT inhaler Inhale 2 puffs into the lungs every 6 (six) hours as needed for wheezing or shortness of breath. Sob  18 g  8  . albuterol (PROVENTIL) (5 MG/ML) 0.5% nebulizer solution Take 0.5 mLs (2.5 mg total) by nebulization every 4 (four) hours as needed for wheezing or shortness of breath. Sob  20 mL  5  . beclomethasone (QVAR) 80 MCG/ACT inhaler Inhale 2 puffs into the lungs 2 (two) times daily.  1 Inhaler  7  . calcium-vitamin D (OSCAL WITH D) 500-200 MG-UNIT per tablet Take 1 tablet by mouth 3 (three) times daily.  90 tablet  10  . cyclobenzaprine (FLEXERIL) 5 MG tablet Take 1 tablet (5 mg total) by mouth 2 (two) times daily as needed for muscle  spasms.  60 tablet  1  . furosemide (LASIX) 20 MG tablet Take 1 tablet (20 mg total) by mouth as needed for fluid or edema.  30 tablet  3  . guaiFENesin-codeine (GUIATUSS AC) 100-10 MG/5ML syrup Take 10 mLs by mouth 4 (four) times daily as needed for cough.  120 mL  0  . Multiple Vitamin (MULTIVITAMIN WITH MINERALS) TABS tablet Take 1 tablet by mouth daily.      . nitrofurantoin, macrocrystal-monohydrate, (MACROBID) 100 MG capsule Take 1 capsule (100 mg total) by mouth 2 (two) times daily.  14 capsule  0  . ondansetron (ZOFRAN) 8 MG tablet Take 1 tablet (8 mg total) by mouth every 8 (eight) hours as needed for nausea or vomiting.  20 tablet  0  . oxyCODONE-acetaminophen (PERCOCET) 10-325 MG per tablet Take 1 tablet by mouth every 8 (eight) hours as needed for pain.  60 tablet  0  . oxyCODONE-acetaminophen (PERCOCET) 5-325 MG per tablet 1 to 2 tablets every 6 hours as needed for pain.  20 tablet  0  . simvastatin (ZOCOR) 20 MG tablet Take 1 tablet (20 mg total) by mouth at bedtime.  90 tablet  3  . traMADol (ULTRAM) 50 MG tablet Take 2 tablets (100 mg total) by mouth every 8 (eight) hours as needed.  60 tablet  2  .  Vitamin D, Ergocalciferol, (DRISDOL) 50000 UNITS CAPS capsule Take 1 capsule (50,000 Units total) by mouth every 7 (seven) days.  20 capsule  0    Exam:  BP 140/99  Pulse 92  Temp(Src) 98.6 F (37 C) (Oral)  Resp 12  SpO2 96%  LMP 06/25/2014 Gen: Well NAD morbidly obese HEENT: EOMI,  MMM Lungs: Normal work of breathing. Slight expiratory wheezing bilaterally Heart: RRR no MRG Abd: NABS, Soft. Nondistended, Nontender no CV angle tenderness to percussion Exts: Brisk capillary refill, warm and well perfused.  ] Neuro: Normal balance and gait  Results for orders placed during the hospital encounter of 07/19/14 (from the past 24 hour(s))  POCT URINALYSIS DIP (DEVICE)     Status: Abnormal   Collection Time    07/19/14 12:01 PM      Result Value Ref Range   Glucose, UA NEGATIVE   NEGATIVE mg/dL   Bilirubin Urine NEGATIVE  NEGATIVE   Ketones, ur NEGATIVE  NEGATIVE mg/dL   Specific Gravity, Urine 1.025  1.005 - 1.030   Hgb urine dipstick SMALL (*) NEGATIVE   pH 5.5  5.0 - 8.0   Protein, ur 30 (*) NEGATIVE mg/dL   Urobilinogen, UA 0.2  0.0 - 1.0 mg/dL   Nitrite POSITIVE (*) NEGATIVE   Leukocytes, UA MODERATE (*) NEGATIVE  POCT PREGNANCY, URINE     Status: None   Collection Time    07/19/14 12:05 PM      Result Value Ref Range   Preg Test, Ur NEGATIVE  NEGATIVE  POCT I-STAT, CHEM 8     Status: Abnormal   Collection Time    07/19/14 12:22 PM      Result Value Ref Range   Sodium 138  137 - 147 mEq/L   Potassium 4.2  3.7 - 5.3 mEq/L   Chloride 106  96 - 112 mEq/L   BUN 6  6 - 23 mg/dL   Creatinine, Ser 4.090.60  0.50 - 1.10 mg/dL   Glucose, Bld 811201 (*) 70 - 99 mg/dL   Calcium, Ion 9.141.17  7.821.12 - 1.23 mmol/L   TCO2 21  0 - 100 mmol/L   Hemoglobin 14.6  12.0 - 15.0 g/dL   HCT 95.643.0  21.336.0 - 08.646.0 %   No results found.  Assessment and Plan: 43 y.o. female with urinary tract infection. Most likely explanation. Plan to treat with Macrobid given patient's penicillin allergy. Urine culture pending. Recommend patient followup with primary care provider.  Discussed warning signs or symptoms. Please see discharge instructions. Patient expresses understanding.   This note was created using Conservation officer, historic buildingsDragon voice recognition software. Any transcription errors are unintended.    Rodolph BongEvan S Tosh Glaze, MD 07/19/14 [phone]

## 2014-07-21 LAB — URINE CULTURE: Special Requests: NORMAL

## 2014-07-21 NOTE — ED Notes (Signed)
Urine culture: > 100,000 colonies E. Coli.  Pt. adequately treated with Macrobid.   Vassie Moselle 07/21/2014

## 2014-08-30 ENCOUNTER — Inpatient Hospital Stay (HOSPITAL_COMMUNITY)
Admission: EM | Admit: 2014-08-30 | Discharge: 2014-08-31 | DRG: 871 | Discharge status: Against Medical Advice | Payer: Self-pay | Attending: Internal Medicine | Admitting: Internal Medicine

## 2014-08-30 ENCOUNTER — Emergency Department (HOSPITAL_COMMUNITY): Payer: Self-pay

## 2014-08-30 ENCOUNTER — Encounter (HOSPITAL_COMMUNITY): Payer: Self-pay | Admitting: Emergency Medicine

## 2014-08-30 DIAGNOSIS — E872 Acidosis, unspecified: Secondary | ICD-10-CM | POA: Diagnosis present

## 2014-08-30 DIAGNOSIS — IMO0001 Reserved for inherently not codable concepts without codable children: Secondary | ICD-10-CM

## 2014-08-30 DIAGNOSIS — Z79899 Other long term (current) drug therapy: Secondary | ICD-10-CM

## 2014-08-30 DIAGNOSIS — E8801 Alpha-1-antitrypsin deficiency: Secondary | ICD-10-CM | POA: Diagnosis present

## 2014-08-30 DIAGNOSIS — R6521 Severe sepsis with septic shock: Secondary | ICD-10-CM

## 2014-08-30 DIAGNOSIS — E1165 Type 2 diabetes mellitus with hyperglycemia: Secondary | ICD-10-CM

## 2014-08-30 DIAGNOSIS — IMO0002 Reserved for concepts with insufficient information to code with codable children: Secondary | ICD-10-CM

## 2014-08-30 DIAGNOSIS — Z87891 Personal history of nicotine dependence: Secondary | ICD-10-CM

## 2014-08-30 DIAGNOSIS — Z6841 Body Mass Index (BMI) 40.0 and over, adult: Secondary | ICD-10-CM

## 2014-08-30 DIAGNOSIS — J45901 Unspecified asthma with (acute) exacerbation: Secondary | ICD-10-CM | POA: Diagnosis present

## 2014-08-30 DIAGNOSIS — I10 Essential (primary) hypertension: Secondary | ICD-10-CM | POA: Diagnosis present

## 2014-08-30 DIAGNOSIS — R6520 Severe sepsis without septic shock: Secondary | ICD-10-CM

## 2014-08-30 DIAGNOSIS — E119 Type 2 diabetes mellitus without complications: Secondary | ICD-10-CM | POA: Diagnosis present

## 2014-08-30 DIAGNOSIS — J438 Other emphysema: Secondary | ICD-10-CM

## 2014-08-30 DIAGNOSIS — J189 Pneumonia, unspecified organism: Secondary | ICD-10-CM | POA: Diagnosis present

## 2014-08-30 DIAGNOSIS — A419 Sepsis, unspecified organism: Principal | ICD-10-CM | POA: Diagnosis present

## 2014-08-30 LAB — CREATININE, SERUM
Creatinine, Ser: 0.82 mg/dL (ref 0.50–1.10)
GFR calc non Af Amer: 86 mL/min — ABNORMAL LOW (ref >90)

## 2014-08-30 LAB — BASIC METABOLIC PANEL
Anion gap: 17 — ABNORMAL HIGH (ref 5–15)
BUN: 9 mg/dL (ref 6–23)
CHLORIDE: 99 meq/L (ref 96–112)
CO2: 20 meq/L (ref 19–32)
Calcium: 8.9 mg/dL (ref 8.4–10.5)
Creatinine, Ser: 0.82 mg/dL (ref 0.50–1.10)
GFR calc Af Amer: >90 mL/min (ref >90)
GFR calc non Af Amer: 86 mL/min — ABNORMAL LOW (ref >90)
GLUCOSE: 132 mg/dL — AB (ref 70–99)
POTASSIUM: 3.5 meq/L — AB (ref 3.7–5.3)
Sodium: 136 mEq/L — ABNORMAL LOW (ref 137–147)

## 2014-08-30 LAB — I-STAT TROPONIN, ED: Troponin i, poc: 0 ng/mL (ref 0.00–0.08)

## 2014-08-30 LAB — URINALYSIS, ROUTINE W REFLEX MICROSCOPIC
Bilirubin Urine: NEGATIVE
Glucose, UA: 500 mg/dL — AB
KETONES UR: 15 mg/dL — AB
NITRITE: NEGATIVE
Protein, ur: NEGATIVE mg/dL
SPECIFIC GRAVITY, URINE: 1.036 — AB (ref 1.005–1.030)
UROBILINOGEN UA: 0.2 mg/dL (ref 0.0–1.0)
pH: 5 (ref 5.0–8.0)

## 2014-08-30 LAB — I-STAT CHEM 8, ED
BUN: 7 mg/dL (ref 6–23)
CHLORIDE: 107 meq/L (ref 96–112)
Calcium, Ion: 1.03 mmol/L — ABNORMAL LOW (ref 1.12–1.23)
Creatinine, Ser: 0.9 mg/dL (ref 0.50–1.10)
Glucose, Bld: 137 mg/dL — ABNORMAL HIGH (ref 70–99)
HEMATOCRIT: 43 % (ref 36.0–46.0)
Hemoglobin: 14.6 g/dL (ref 12.0–15.0)
POTASSIUM: 3.2 meq/L — AB (ref 3.7–5.3)
SODIUM: 129 meq/L — AB (ref 137–147)
TCO2: 20 mmol/L (ref 0–100)

## 2014-08-30 LAB — CBC
HCT: 40.3 % (ref 36.0–46.0)
HCT: 36.9 % (ref 36.0–46.0)
HEMOGLOBIN: 14.3 g/dL (ref 12.0–15.0)
Hemoglobin: 12.5 g/dL (ref 12.0–15.0)
MCH: 31.1 pg (ref 26.0–34.0)
MCH: 30.3 pg (ref 26.0–34.0)
MCHC: 35.5 g/dL (ref 30.0–36.0)
MCHC: 33.9 g/dL (ref 30.0–36.0)
MCV: 87.6 fL (ref 78.0–100.0)
MCV: 89.3 fL (ref 78.0–100.0)
PLATELETS: 230 10*3/uL (ref 150–400)
Platelets: 208 10*3/uL (ref 150–400)
RBC: 4.6 MIL/uL (ref 3.87–5.11)
RBC: 4.13 MIL/uL (ref 3.87–5.11)
RDW: 12.1 % (ref 11.5–15.5)
RDW: 12.1 % (ref 11.5–15.5)
WBC: 14.3 10*3/uL — AB (ref 4.0–10.5)
WBC: 13.6 10*3/uL — ABNORMAL HIGH (ref 4.0–10.5)

## 2014-08-30 LAB — URINE MICROSCOPIC-ADD ON

## 2014-08-30 LAB — GLUCOSE, CAPILLARY
GLUCOSE-CAPILLARY: 531 mg/dL — AB (ref 70–99)
Glucose-Capillary: 300 mg/dL — ABNORMAL HIGH (ref 70–99)
Glucose-Capillary: 514 mg/dL — ABNORMAL HIGH (ref 70–99)

## 2014-08-30 LAB — MRSA PCR SCREENING: MRSA by PCR: NEGATIVE

## 2014-08-30 LAB — I-STAT CG4 LACTIC ACID, ED: LACTIC ACID, VENOUS: 3.14 mmol/L — AB (ref 0.5–2.2)

## 2014-08-30 MED ORDER — OXYCODONE HCL 5 MG PO TABS
5.0000 mg | ORAL_TABLET | ORAL | Status: DC | PRN
  Administered 2014-08-30 – 2014-08-31 (×2): 5 mg via ORAL
  Filled 2014-08-30 (×2): qty 1

## 2014-08-30 MED ORDER — ENOXAPARIN SODIUM 40 MG/0.4ML ~~LOC~~ SOLN
40.0000 mg | SUBCUTANEOUS | Status: DC
  Administered 2014-08-30: 40 mg via SUBCUTANEOUS
  Filled 2014-08-30 (×2): qty 0.4

## 2014-08-30 MED ORDER — ALUM & MAG HYDROXIDE-SIMETH 200-200-20 MG/5ML PO SUSP: 30.0000 mL | Freq: Four times a day (QID) | ORAL | Status: DC | PRN

## 2014-08-30 MED ORDER — LEVOFLOXACIN IN D5W 750 MG/150ML IV SOLN
750.0000 mg | Freq: Once | INTRAVENOUS | Status: AC
  Administered 2014-08-30: 750 mg via INTRAVENOUS
  Filled 2014-08-30: qty 150

## 2014-08-30 MED ORDER — ACETAMINOPHEN 650 MG RE SUPP: 650.0000 mg | Freq: Four times a day (QID) | RECTAL | Status: DC | PRN

## 2014-08-30 MED ORDER — POTASSIUM CHLORIDE CRYS ER 20 MEQ PO TBCR
40.0000 meq | EXTENDED_RELEASE_TABLET | Freq: Once | ORAL | Status: AC
  Administered 2014-08-30: 40 meq via ORAL
  Filled 2014-08-30: qty 2

## 2014-08-30 MED ORDER — ALBUTEROL (5 MG/ML) CONTINUOUS INHALATION SOLN
15.0000 mg/h | INHALATION_SOLUTION | Freq: Once | RESPIRATORY_TRACT | Status: AC
  Administered 2014-08-30: 15 mg/h via RESPIRATORY_TRACT
  Filled 2014-08-30: qty 20

## 2014-08-30 MED ORDER — SODIUM CHLORIDE 0.9 % IV SOLN: INTRAVENOUS | Status: DC

## 2014-08-30 MED ORDER — ONDANSETRON HCL 4 MG/2ML IJ SOLN: 4.0000 mg | Freq: Four times a day (QID) | INTRAMUSCULAR | Status: DC | PRN

## 2014-08-30 MED ORDER — DEXTROSE 5 % IV SOLN
1.0000 g | INTRAVENOUS | Status: DC
  Administered 2014-08-30: 1 g via INTRAVENOUS
  Filled 2014-08-30 (×2): qty 10

## 2014-08-30 MED ORDER — SIMVASTATIN 40 MG PO TABS
40.0000 mg | ORAL_TABLET | Freq: Every day | ORAL | Status: DC
  Administered 2014-08-30: 40 mg via ORAL
  Filled 2014-08-30 (×2): qty 1

## 2014-08-30 MED ORDER — ACETAMINOPHEN 500 MG PO TABS
1000.0000 mg | ORAL_TABLET | Freq: Once | ORAL | Status: AC
  Administered 2014-08-30: 1000 mg via ORAL
  Filled 2014-08-30: qty 2

## 2014-08-30 MED ORDER — SODIUM CHLORIDE 0.9 % IJ SOLN: 3.0000 mL | Freq: Two times a day (BID) | INTRAMUSCULAR | Status: DC

## 2014-08-30 MED ORDER — ACETAMINOPHEN 325 MG PO TABS
650.0000 mg | ORAL_TABLET | Freq: Four times a day (QID) | ORAL | Status: DC | PRN
Start: 2014-08-30 — End: 2014-08-31
  Administered 2014-08-30 – 2014-08-31 (×2): 650 mg via ORAL
  Filled 2014-08-30 (×2): qty 2

## 2014-08-30 MED ORDER — POTASSIUM CHLORIDE CRYS ER 20 MEQ PO TBCR
40.0000 meq | EXTENDED_RELEASE_TABLET | Freq: Once | ORAL | Status: DC
  Administered 2014-08-30: 40 meq via ORAL
  Filled 2014-08-30: qty 2

## 2014-08-30 MED ORDER — IPRATROPIUM BROMIDE 0.02 % IN SOLN
1.0000 mg | Freq: Once | RESPIRATORY_TRACT | Status: AC
  Administered 2014-08-30: 1 mg via RESPIRATORY_TRACT
  Filled 2014-08-30: qty 5

## 2014-08-30 MED ORDER — INSULIN ASPART 100 UNIT/ML ~~LOC~~ SOLN: 0.0000 [IU] | Freq: Every day | SUBCUTANEOUS | Status: DC

## 2014-08-30 MED ORDER — METHYLPREDNISOLONE SODIUM SUCC 125 MG IJ SOLR
125.0000 mg | Freq: Once | INTRAMUSCULAR | Status: AC
  Administered 2014-08-30: 125 mg via INTRAVENOUS
  Filled 2014-08-30: qty 2

## 2014-08-30 MED ORDER — DEXTROSE 5 % IV SOLN
500.0000 mg | INTRAVENOUS | Status: DC
  Administered 2014-08-30: 500 mg via INTRAVENOUS
  Filled 2014-08-30 (×2): qty 500

## 2014-08-30 MED ORDER — SODIUM CHLORIDE 0.9 % IV SOLN
INTRAVENOUS | Status: AC
Start: 2014-08-30 — End: 2014-08-31
  Administered 2014-08-30: 18:00:00 via INTRAVENOUS

## 2014-08-30 MED ORDER — ALBUTEROL SULFATE (2.5 MG/3ML) 0.083% IN NEBU: 2.5000 mg | INHALATION_SOLUTION | RESPIRATORY_TRACT | Status: DC | PRN

## 2014-08-30 MED ORDER — ONDANSETRON HCL 4 MG PO TABS: 4.0000 mg | ORAL_TABLET | Freq: Four times a day (QID) | ORAL | Status: DC | PRN

## 2014-08-30 MED ORDER — MORPHINE SULFATE 2 MG/ML IJ SOLN: 2.0000 mg | INTRAMUSCULAR | Status: DC | PRN

## 2014-08-30 MED ORDER — ALBUTEROL SULFATE (5 MG/ML) 0.5% IN NEBU: 2.5000 mg | INHALATION_SOLUTION | RESPIRATORY_TRACT | Status: DC | PRN

## 2014-08-30 MED ORDER — SODIUM CHLORIDE 0.9 % IV BOLUS (SEPSIS)
1000.0000 mL | Freq: Once | INTRAVENOUS | Status: AC
  Administered 2014-08-30: 1000 mL via INTRAVENOUS

## 2014-08-30 MED ORDER — BECLOMETHASONE DIPROPIONATE 80 MCG/ACT IN AERS
2.0000 | INHALATION_SPRAY | Freq: Every day | RESPIRATORY_TRACT | Status: DC
  Administered 2014-08-31: 2 via RESPIRATORY_TRACT
  Filled 2014-08-30: qty 8.7

## 2014-08-30 MED ORDER — INSULIN ASPART 100 UNIT/ML ~~LOC~~ SOLN
0.0000 [IU] | Freq: Three times a day (TID) | SUBCUTANEOUS | Status: DC
  Administered 2014-08-30: 11 [IU] via SUBCUTANEOUS

## 2014-08-30 MED ORDER — METHYLPREDNISOLONE SODIUM SUCC 125 MG IJ SOLR
60.0000 mg | Freq: Four times a day (QID) | INTRAMUSCULAR | Status: DC
  Administered 2014-08-30 – 2014-08-31 (×3): 60 mg via INTRAVENOUS
  Filled 2014-08-30: qty 0.96
  Filled 2014-08-30: qty 2
  Filled 2014-08-30 (×6): qty 0.96

## 2014-08-30 MED ORDER — IOHEXOL 350 MG/ML SOLN
80.0000 mL | Freq: Once | INTRAVENOUS | Status: AC | PRN
  Administered 2014-08-30: 100 mL via INTRAVENOUS

## 2014-08-30 NOTE — ED Notes (Signed)
Pt arrives via POV from home with sudden onset of SOB, chest tightness that began this AM. Pt with labored resp. Moderate distress noted. Reports some chil;ls recently. States has been taking prednisone and albuterol at home with no relief.

## 2014-08-30 NOTE — H&P (Signed)
[facility] History and Physical  Carla Hicks WGN:[id number] DOB: 1971-10-03 DOA: 08/30/2014  Referring physician:  PCP: Doris Cheadle, MD   Chief Complaint: Cough, shortness of breath  HPI: Carla Hicks is a 43 y.o. female with a past medical history of alpha-1 antitrypsin deficiency, asthma, morbid obesity, on chronic prednisone therapy presenting to the emergency room with complaints of worsening cough and shortness of breath. She states that symptoms started overnight developing cough associated with yellow sputum production, shortness of breath, wheezing, chest pain precipitated by deep inspiration and cough as well as a generalized weakness, malaise, fatigue, overall feeling ill. She denies recent travel or sick contacts. A CT scan of lungs with IV contrast in the emergency room showed right upper lobe airspace disease consistent with pneumonia. She was given nebs, 25 mg of SoluMedrol and Levaquin.                                                                                                                                                Review of Systems:  Constitutional:  No weight loss, night sweats, positive for fevers, chills, fatigue.  HEENT:  No headaches, Difficulty swallowing,Tooth/dental problems,Sore throat,  No sneezing, itching, ear ache, nasal congestion, post nasal drip,  Cardio-vascular:  Positive for chest pain with deep inspiration, Orthopnea, PND, swelling in lower extremities, anasarca, dizziness, palpitations  GI:  No heartburn, indigestion, abdominal pain, nausea, vomiting, diarrhea, change in bowel habits, loss of appetite  Resp:  Positive for shortness of breath with exertion or at rest. Positive for excess mucus, productive cough, No non-productive cough, No coughing up of blood.No change in color of mucus.No wheezing.No chest wall deformity  Skin:  no rash or lesions.  GU:  no dysuria, change in color of urine, no urgency or frequency. No  flank pain.  Musculoskeletal:  No joint pain or swelling. No decreased range of motion. No back pain.  Psych:  No change in mood or affect. No depression or anxiety. No memory loss.   Past Medical History  Diagnosis Date  . Diabetes mellitus   . Hypertension   . Urinary bladder disorder   . Asthma   . Emphysema    Past Surgical History  Procedure Laterality Date  . Cesarean section     Social History:  reports that she has quit smoking. She does not have any smokeless tobacco history on file. She reports that she does not drink alcohol or use illicit drugs.  Allergies  Allergen Reactions  . Penicillins Hives    History reviewed. No pertinent family history.   Prior to Admission medications   Medication Sig Start Date End Date Taking? Authorizing Provider  albuterol (PROVENTIL HFA;VENTOLIN HFA) 108 (90 BASE) MCG/ACT inhaler Inhale 2 puffs into the lungs every 6 (six) hours as needed for wheezing or shortness of breath. Sob 03/27/14  Yes Nayana  Abrol, MD  albuterol (PROVENTIL) (5 MG/ML) 0.5% nebulizer solution Take 0.5 mLs (2.5 mg total) by nebulization every 4 (four) hours as needed for wheezing or shortness of breath. Sob 03/27/14  Yes Richarda Overlie, MD  beclomethasone (QVAR) 80 MCG/ACT inhaler Inhale 2 puffs into the lungs daily.   Yes Historical Provider, MD  losartan-hydrochlorothiazide (HYZAAR) 100-25 MG per tablet Take 1 tablet by mouth daily. 03/27/14  Yes Richarda Overlie, MD  metFORMIN (GLUCOPHAGE) 1000 MG tablet Take 1 tablet (1,000 mg total) by mouth 2 (two) times daily with a meal. 04/10/14  Yes Richarda Overlie, MD  Multiple Vitamin (MULTIVITAMIN WITH MINERALS) TABS tablet Take 1 tablet by mouth daily.   Yes Historical Provider, MD  oxyCODONE-acetaminophen (PERCOCET) 10-325 MG per tablet Take 1 tablet by mouth every 8 (eight) hours as needed for pain. 10/17/13  Yes Alison Murray, MD  predniSONE (DELTASONE) 20 MG tablet Take 3 tablets (60 mg total) by mouth daily. 03/27/14  Yes Richarda Overlie, MD  simvastatin (ZOCOR) 40 MG tablet Take 40 mg by mouth daily at 6 PM.   Yes Historical Provider, MD  traMADol (ULTRAM) 50 MG tablet Take 100 mg by mouth every 8 (eight) hours as needed for moderate pain.   Yes Historical Provider, MD  Vitamin D, Ergocalciferol, (DRISDOL) 50000 UNITS CAPS capsule Take 1 capsule (50,000 Units total) by mouth every 7 (seven) days. 03/30/14  Yes Richarda Overlie, MD   Physical Exam: Filed Vitals:   08/30/14 1411 08/30/14 1426 08/30/14 1630 08/30/14 1645  BP:   Pulse:  121 129   Temp:      TempSrc:      Resp:  24  19  SpO2: 98% 100% 100%     Wt Readings from Last 3 Encounters:  03/27/14 117.028 kg (258 lb)  11/07/13 121.11 kg (267 lb)  10/18/13 119.069 kg (262 lb 8 oz)    General:  Patient is ill-appearing, dyspneic, awake alert able to follow commands Eyes: PERRL, normal lids, irises & conjunctiva ENT: grossly normal hearing, lips & tongue Neck: no LAD, masses or thyromegaly Cardiovascular: RRR, no m/r/g. No LE edema. Tachycardic Telemetry: SR, no arrhythmias  Respiratory: Diminished breath sounds bilaterally, positive bilateral expiratory wheezes, right-sided rhonchi Abdomen: soft, ntnd Skin: no rash or induration seen on limited exam Musculoskeletal: grossly normal tone BUE/BLE Psychiatric: grossly normal mood and affect, speech fluent and appropriate Neurologic: grossly non-focal.          Labs on Admission:  Basic Metabolic Panel:  Recent Labs Lab 08/30/14 1319 08/30/14 1352  NA 136* 129*  K 3.5* 3.2*  CL 99 107  CO2 20  --   GLUCOSE 132* 137*  BUN 9 7  CREATININE 0.82 0.90  CALCIUM 8.9  --    Liver Function Tests: No results found for this basename: AST, ALT, ALKPHOS, BILITOT, PROT, ALBUMIN,  in the last 168 hours No results found for this basename: LIPASE, AMYLASE,  in the last 168 hours No results found for this basename: AMMONIA,  in the last 168 hours CBC:  Recent Labs Lab 08/30/14 1319  08/30/14 1352  WBC 14.3*  --   HGB 14.3 14.6  HCT 40.3 43.0  MCV 87.6  --   PLT 230  --    Cardiac Enzymes: No results found for this basename: CKTOTAL, CKMB, CKMBINDEX, TROPONINI,  in the last 168 hours  BNP (last 3 results) No results found for this basename: PROBNP,  in the last 8760 hours CBG:  No results found for this basename: GLUCAP,  in the last 168 hours  Radiological Exams on Admission: Dg Chest 2 View  08/30/2014   CLINICAL DATA:  Shortness of breath  EXAM: CHEST  2 VIEW  COMPARISON:  11/24/2012  FINDINGS: Cardiac shadow is within normal limits. The lungs are well aerated bilaterally. A focal right upper lobe infiltrate is noted along the border of the minor fissure. No acute bony abnormality is seen.  IMPRESSION: Right upper lobe pneumonia. Followup films following appropriate therapy are recommended.   Electronically Signed   By: Alcide Clever M.D.   On: 08/30/2014 13:42   Ct Angio Chest Pe W/cm &/or Wo Cm  08/30/2014   CLINICAL DATA:  Acute onset shortness of breath and chest tightness.  EXAM: CT ANGIOGRAPHY CHEST WITH CONTRAST  TECHNIQUE: Multidetector CT imaging of the chest was performed using the standard protocol during bolus administration of intravenous contrast. Multiplanar CT image reconstructions and MIPs were obtained to evaluate the vascular anatomy.  CONTRAST:  100 mL OMNIPAQUE IOHEXOL 350 MG/ML SOLN  COMPARISON:  PA and lateral chest earlier this same day and 11/24/2012  FINDINGS: The study is somewhat limited by bolus timing. No pulmonary embolus is identified. Heart size is upper normal. No pleural or pericardial effusion. No axillary, hilar or mediastinal lymphadenopathy. Lungs demonstrate airspace disease in the right upper lobe consistent with pneumonia. Mild dependent atelectasis is also identified. The lungs are otherwise unremarkable.  Visualized upper abdomen shows fatty infiltration of the liver. Imaged intra-abdominal contents are otherwise unremarkable. No  bony abnormality is identified.  Review of the MIP images confirms the above findings.  IMPRESSION: Although somewhat limited by bolus timing, no pulmonary embolus is identified.  Right upper lobe airspace disease consistent with pneumonia.  Fatty infiltration of the liver.   Electronically Signed   By: Drusilla Kanner M.D.   On: 08/30/2014 15:38    EKG: Independently reviewed.   Assessment/Plan Principal Problem:   CAP (community acquired pneumonia) Active Problems:   Type II or unspecified type diabetes mellitus without mention of complication, uncontrolled   Severe sepsis with acute organ dysfunction   AAT (alpha-1-antitrypsin) deficiency   Unspecified essential hypertension   Unspecified asthma, with exacerbation   1. Community acquire pneumonia. Patient with history of chronic immunosuppression, presenting with clinical signs and symptoms suggestive of pneumonia. She had a CT scan of lungs performed in the emergency department which revealed right upper lobe airspace disease suggestive of pneumonia. Blood cultures were obtained, will start empiric IV antibiotic therapy with IV azithromycin and ceftriaxone. Plan to repeat chest x-ray in a.m. Will admit her to step down unit for close monitoring overnight.  2. Sepsis, present on admission, evidenced by hypotension as she presented with a blood pressure of 85/56, heart rate of 130, elevated lactate of 3.14, white count of 14,300. Source of infection likely community-acquired pneumonia. Blood pressures improving after administration of IV fluids. Will admit patient to step down unit, continue empiric IV antibiotic therapy with ceftriaxone and azithromycin. Followup on sputum and blood cultures. 3. Asthmatic exacerbation. I suspect underlying infectious process precipitating acute asthmatic exacerbation. Will treat with Solu-Medrol 60 mg IV every 6 hours, provide albuterol nebs, empiric IV antibiotic therapy 4. History of hypertension. Will hold  antihypertensive agents due to hypotension 5. Type 2 diabetes mellitus. Will hold metformin for now, place patient on sliding scale coverage with insulin, and hemoglobin A1c. 6. DVT prophylaxis. Lovenox  Code Status: Full code Family Communication: Spoke with patient's daughter  present at bedside Disposition Plan: Will admit patient to step down, anticipate will require greater than 2 nights hospitalixzation  Time spent: 70 min  Jeralyn Bennett [facility] Pager [phone]  **Disclaimer: This note may have been dictated with voice recognition software. Similar sounding words can inadvertently be transcribed and this note may contain transcription errors which may not have been corrected upon publication of note.**

## 2014-08-30 NOTE — ED Provider Notes (Addendum)
CSN: [id number]     Arrival date & time 08/30/14  1305 History   First MD Initiated Contact with Patient 08/30/14 1321     Chief Complaint  Patient presents with  . Shortness of Breath     (Consider location/radiation/quality/duration/timing/severity/associated sxs/prior Treatment) The history is provided by the patient.  Carla Hicks is a 43 y.o. female hx of DM, HTN, emphysema with chronic steroid use (prednisone 60 mg daily at baseline) here with shortness of breath chest tightness. Acute onset of SOB this morning. Associated with chest tightness as well. Tried albuterol today with minimal relief. Has been taking her prednisone. Denies recent travel, has some bilateral leg swelling.    Past Medical History  Diagnosis Date  . Diabetes mellitus   . Hypertension   . Urinary bladder disorder   . Asthma   . Emphysema    Past Surgical History  Procedure Laterality Date  . Cesarean section     History reviewed. No pertinent family history. History  Substance Use Topics  . Smoking status: Former Games developer  . Smokeless tobacco: Not on file  . Alcohol Use: No   OB History   Grav Para Term Preterm Abortions TAB SAB Ect Mult Living   Review of Systems  Respiratory: Positive for shortness of breath.   All other systems reviewed and are negative.     Allergies  Penicillins  Home Medications   Prior to Admission medications   Medication Sig Start Date End Date Taking? Authorizing Provider  albuterol (PROVENTIL HFA;VENTOLIN HFA) 108 (90 BASE) MCG/ACT inhaler Inhale 2 puffs into the lungs every 6 (six) hours as needed for wheezing or shortness of breath. Sob 03/27/14  Yes Richarda Overlie, MD  albuterol (PROVENTIL) (5 MG/ML) 0.5% nebulizer solution Take 0.5 mLs (2.5 mg total) by nebulization every 4 (four) hours as needed for wheezing or shortness of breath. Sob 03/27/14  Yes Richarda Overlie, MD  beclomethasone (QVAR) 80 MCG/ACT inhaler Inhale 2 puffs into the lungs  daily.   Yes Historical Provider, MD  losartan-hydrochlorothiazide (HYZAAR) 100-25 MG per tablet Take 1 tablet by mouth daily. 03/27/14  Yes Richarda Overlie, MD  metFORMIN (GLUCOPHAGE) 1000 MG tablet Take 1 tablet (1,000 mg total) by mouth 2 (two) times daily with a meal. 04/10/14  Yes Richarda Overlie, MD  Multiple Vitamin (MULTIVITAMIN WITH MINERALS) TABS tablet Take 1 tablet by mouth daily.   Yes Historical Provider, MD  oxyCODONE-acetaminophen (PERCOCET) 10-325 MG per tablet Take 1 tablet by mouth every 8 (eight) hours as needed for pain. 10/17/13  Yes Alison Murray, MD  predniSONE (DELTASONE) 20 MG tablet Take 3 tablets (60 mg total) by mouth daily. 03/27/14  Yes Richarda Overlie, MD  simvastatin (ZOCOR) 40 MG tablet Take 40 mg by mouth daily at 6 PM.   Yes Historical Provider, MD  traMADol (ULTRAM) 50 MG tablet Take 100 mg by mouth every 8 (eight) hours as needed for moderate pain.   Yes Historical Provider, MD  Vitamin D, Ergocalciferol, (DRISDOL) 50000 UNITS CAPS capsule Take 1 capsule (50,000 Units total) by mouth every 7 (seven) days. 03/30/14  Yes Richarda Overlie, MD   BP 92/54  Pulse 121  Temp(Src) 100.4 F (38 C) (Oral)  Resp 24  SpO2 100%  LMP 08/21/2014 Physical Exam  Nursing note and vitals reviewed. Constitutional: She is oriented to person, place, and time.  Uncomfortable, tachypneic   HENT:  Head: Normocephalic.  Mouth/Throat: Oropharynx is clear and moist.  Eyes: Conjunctivae and EOM are normal. Pupils are equal, round, and reactive to light.  Neck: Neck supple.  Cardiovascular: Regular rhythm and normal heart sounds.   Tachycardic   Pulmonary/Chest:  Tachypneic, minimal bilateral wheezing.   Abdominal: Soft. Bowel sounds are normal. She exhibits no distension. There is no tenderness. There is no rebound.  Musculoskeletal:  1+ edema bilaterally   Neurological: She is alert and oriented to person, place, and time. No cranial nerve deficit. Coordination normal.  Skin: Skin is warm and  dry.  Psychiatric: She has a normal mood and affect. Her behavior is normal. Judgment and thought content normal.    ED Course  Procedures (including critical care time)  CRITICAL CARE Performed by: Silverio Lay, Tyke Outman   Total critical care time: 30 min   Critical care time was exclusive of separately billable procedures and treating other patients.  Critical care was necessary to treat or prevent imminent or life-threatening deterioration.  Critical care was time spent personally by me on the following activities: development of treatment plan with patient and/or surrogate as well as nursing, discussions with consultants, evaluation of patient's response to treatment, examination of patient, obtaining history from patient or surrogate, ordering and performing treatments and interventions, ordering and review of laboratory studies, ordering and review of radiographic studies, pulse oximetry and re-evaluation of patient's condition.   Labs Review Labs Reviewed  CBC - Abnormal; Notable for the following:    WBC 14.3 (*)    All other components within normal limits  BASIC METABOLIC PANEL - Abnormal; Notable for the following:    Sodium 136 (*)    Potassium 3.5 (*)    Glucose, Bld 132 (*)    GFR calc non Af Amer 86 (*)    Anion gap 17 (*)    All other components within normal limits  I-STAT CHEM 8, ED - Abnormal; Notable for the following:    Sodium 129 (*)    Potassium 3.2 (*)    Glucose, Bld 137 (*)    Calcium, Ion 1.03 (*)    All other components within normal limits  I-STAT CG4 LACTIC ACID, ED - Abnormal; Notable for the following:    Lactic Acid, Venous 3.14 (*)    All other components within normal limits  CULTURE, BLOOD (ROUTINE X 2)  CULTURE, BLOOD (ROUTINE X 2)  I-STAT TROPOININ, ED    Imaging Review Dg Chest 2 View  08/30/2014   CLINICAL DATA:  Shortness of breath  EXAM: CHEST  2 VIEW  COMPARISON:  11/24/2012  FINDINGS: Cardiac shadow is within normal limits. The lungs  are well aerated bilaterally. A focal right upper lobe infiltrate is noted along the border of the minor fissure. No acute bony abnormality is seen.  IMPRESSION: Right upper lobe pneumonia. Followup films following appropriate therapy are recommended.   Electronically Signed   By: Alcide Clever M.D.   On: 08/30/2014 13:42   Ct Angio Chest Pe W/cm &/or Wo Cm  08/30/2014   CLINICAL DATA:  Acute onset shortness of breath and chest tightness.  EXAM: CT ANGIOGRAPHY CHEST WITH CONTRAST  TECHNIQUE: Multidetector CT imaging of the chest was performed using the standard protocol during bolus administration of intravenous contrast. Multiplanar CT image reconstructions and MIPs were obtained to evaluate the vascular anatomy.  CONTRAST:  100 mL OMNIPAQUE IOHEXOL 350 MG/ML SOLN  COMPARISON:  PA and lateral chest earlier this same day and 11/24/2012  FINDINGS: The study is somewhat  limited by bolus timing. No pulmonary embolus is identified. Heart size is upper normal. No pleural or pericardial effusion. No axillary, hilar or mediastinal lymphadenopathy. Lungs demonstrate airspace disease in the right upper lobe consistent with pneumonia. Mild dependent atelectasis is also identified. The lungs are otherwise unremarkable.  Visualized upper abdomen shows fatty infiltration of the liver. Imaged intra-abdominal contents are otherwise unremarkable. No bony abnormality is identified.  Review of the MIP images confirms the above findings.  IMPRESSION: Although somewhat limited by bolus timing, no pulmonary embolus is identified.  Right upper lobe airspace disease consistent with pneumonia.  Fatty infiltration of the liver.   Electronically Signed   By: Drusilla Kanner M.D.   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 female here with SOB. Likely asthma exacerbation, however she is tachycardic and hypotensive. I am concerned for possible PE as well. Will get CT angio and treat for asthma.   4:00 PM WBC elevated, lactate 3. BP improved to mid 90s after 2 L NS.  CT angio showed pneumonia, no PE. Given levaquin. I am concerned for severe sepsis from pneumonia. Will admit to stepdown.   Richardean Canal, MD 08/30/14 1615  Richardean Canal, MD 08/31/14 [phone]

## 2014-08-30 NOTE — ED Notes (Signed)
Labs reported to Dr.Yao.

## 2014-08-31 ENCOUNTER — Inpatient Hospital Stay (HOSPITAL_COMMUNITY): Payer: Self-pay

## 2014-08-31 LAB — BASIC METABOLIC PANEL
ANION GAP: 15 (ref 5–15)
Anion gap: 20 — ABNORMAL HIGH (ref 5–15)
Anion gap: 16 — ABNORMAL HIGH (ref 5–15)
BUN: 11 mg/dL (ref 6–23)
BUN: 11 mg/dL (ref 6–23)
BUN: 8 mg/dL (ref 6–23)
CALCIUM: 8 mg/dL — AB (ref 8.4–10.5)
CHLORIDE: 100 meq/L (ref 96–112)
CO2: 15 mEq/L — ABNORMAL LOW (ref 19–32)
CO2: 19 meq/L (ref 19–32)
CO2: 19 meq/L (ref 19–32)
CREATININE: 0.58 mg/dL (ref 0.50–1.10)
Calcium: 7.9 mg/dL — ABNORMAL LOW (ref 8.4–10.5)
Calcium: 7.7 mg/dL — ABNORMAL LOW (ref 8.4–10.5)
Chloride: 98 mEq/L (ref 96–112)
Chloride: 102 mEq/L (ref 96–112)
Creatinine, Ser: 0.75 mg/dL (ref 0.50–1.10)
Creatinine, Ser: 0.65 mg/dL (ref 0.50–1.10)
GFR calc Af Amer: >90 mL/min (ref >90)
GFR calc Af Amer: >90 mL/min (ref >90)
GFR calc Af Amer: >90 mL/min (ref >90)
GFR calc non Af Amer: >90 mL/min (ref >90)
GFR calc non Af Amer: >90 mL/min (ref >90)
GFR calc non Af Amer: >90 mL/min (ref >90)
GLUCOSE: 221 mg/dL — AB (ref 70–99)
Glucose, Bld: 509 mg/dL — ABNORMAL HIGH (ref 70–99)
Glucose, Bld: 421 mg/dL — ABNORMAL HIGH (ref 70–99)
POTASSIUM: 4.4 meq/L (ref 3.7–5.3)
Potassium: 4.8 mEq/L (ref 3.7–5.3)
Potassium: 3.8 mEq/L (ref 3.7–5.3)
SODIUM: 134 meq/L — AB (ref 137–147)
SODIUM: 137 meq/L (ref 137–147)
Sodium: 133 mEq/L — ABNORMAL LOW (ref 137–147)

## 2014-08-31 LAB — CBC
HCT: 34.8 % — ABNORMAL LOW (ref 36.0–46.0)
HEMOGLOBIN: 12.2 g/dL (ref 12.0–15.0)
MCH: 30.7 pg (ref 26.0–34.0)
MCHC: 35.1 g/dL (ref 30.0–36.0)
MCV: 87.4 fL (ref 78.0–100.0)
Platelets: 205 10*3/uL (ref 150–400)
RBC: 3.98 MIL/uL (ref 3.87–5.11)
RDW: 12 % (ref 11.5–15.5)
WBC: 11.5 10*3/uL — AB (ref 4.0–10.5)

## 2014-08-31 LAB — GLUCOSE, CAPILLARY
GLUCOSE-CAPILLARY: 458 mg/dL — AB (ref 70–99)
GLUCOSE-CAPILLARY: 273 mg/dL — AB (ref 70–99)
GLUCOSE-CAPILLARY: 227 mg/dL — AB (ref 70–99)
GLUCOSE-CAPILLARY: 137 mg/dL — AB (ref 70–99)
Glucose-Capillary: 404 mg/dL — ABNORMAL HIGH (ref 70–99)
Glucose-Capillary: 320 mg/dL — ABNORMAL HIGH (ref 70–99)
Glucose-Capillary: 264 mg/dL — ABNORMAL HIGH (ref 70–99)
Glucose-Capillary: 212 mg/dL — ABNORMAL HIGH (ref 70–99)
Glucose-Capillary: 203 mg/dL — ABNORMAL HIGH (ref 70–99)

## 2014-08-31 LAB — HEMOGLOBIN A1C
HEMOGLOBIN A1C: 8.8 % — AB (ref <5.7)
MEAN PLASMA GLUCOSE: 206 mg/dL — AB (ref <117)

## 2014-08-31 MED ORDER — DEXTROSE 50 % IV SOLN: 25.0000 mL | INTRAVENOUS | Status: DC | PRN

## 2014-08-31 MED ORDER — OXYCODONE-ACETAMINOPHEN 10-325 MG PO TABS: 1.0000 | ORAL_TABLET | Freq: Three times a day (TID) | ORAL | Status: DC | PRN

## 2014-08-31 MED ORDER — INSULIN DETEMIR 100 UNIT/ML ~~LOC~~ SOLN
20.0000 [IU] | Freq: Once | SUBCUTANEOUS | Status: AC
  Administered 2014-08-31: 20 [IU] via SUBCUTANEOUS
  Filled 2014-08-31: qty 0.2

## 2014-08-31 MED ORDER — SODIUM CHLORIDE 0.9 % IV SOLN
Freq: Once | INTRAVENOUS | Status: AC
  Administered 2014-08-31: 03:00:00 via INTRAVENOUS

## 2014-08-31 MED ORDER — TRAMADOL HCL 50 MG PO TABS: 100.0000 mg | ORAL_TABLET | Freq: Three times a day (TID) | ORAL | Status: DC | PRN

## 2014-08-31 MED ORDER — AZITHROMYCIN 500 MG PO TABS
500.0000 mg | ORAL_TABLET | Freq: Every day | ORAL | Status: DC
Start: 2014-08-31 — End: 2014-08-31
  Filled 2014-08-31: qty 1

## 2014-08-31 MED ORDER — OXYCODONE HCL 5 MG PO TABS: 5.0000 mg | ORAL_TABLET | Freq: Three times a day (TID) | ORAL | Status: DC | PRN

## 2014-08-31 MED ORDER — INSULIN ASPART 100 UNIT/ML ~~LOC~~ SOLN: 0.0000 [IU] | Freq: Every day | SUBCUTANEOUS | Status: DC

## 2014-08-31 MED ORDER — PREDNISONE 50 MG PO TABS
60.0000 mg | ORAL_TABLET | Freq: Every day | ORAL | Status: DC
  Administered 2014-08-31: 60 mg via ORAL
  Filled 2014-08-31: qty 1

## 2014-08-31 MED ORDER — INSULIN ASPART 100 UNIT/ML ~~LOC~~ SOLN
0.0000 [IU] | Freq: Three times a day (TID) | SUBCUTANEOUS | Status: DC
  Administered 2014-08-31: 3 [IU] via SUBCUTANEOUS

## 2014-08-31 MED ORDER — SODIUM CHLORIDE 0.9 % IV SOLN
INTRAVENOUS | Status: DC
  Administered 2014-08-31: 4 [IU]/h via INTRAVENOUS
  Filled 2014-08-31: qty 2.5

## 2014-08-31 MED ORDER — SODIUM CHLORIDE 0.9 % IV SOLN
INTRAVENOUS | Status: DC
  Administered 2014-08-31: 04:00:00 via INTRAVENOUS

## 2014-08-31 MED ORDER — DEXTROSE-NACL 5-0.45 % IV SOLN
INTRAVENOUS | Status: DC
  Administered 2014-08-31: 08:00:00 via INTRAVENOUS

## 2014-08-31 MED ORDER — POTASSIUM CHLORIDE 10 MEQ/100ML IV SOLN
10.0000 meq | INTRAVENOUS | Status: AC
  Administered 2014-08-31 (×2): 10 meq via INTRAVENOUS
  Filled 2014-08-31 (×2): qty 100

## 2014-08-31 MED ORDER — OXYCODONE-ACETAMINOPHEN 5-325 MG PO TABS: 1.0000 | ORAL_TABLET | Freq: Three times a day (TID) | ORAL | Status: DC | PRN

## 2014-08-31 MED ORDER — LEVOFLOXACIN 750 MG PO TABS
750.0000 mg | ORAL_TABLET | Freq: Every day | ORAL | Status: DC
Start: 2014-08-31 — End: 2014-12-11

## 2014-08-31 NOTE — Progress Notes (Signed)
Utilization review completed. Thorn Demas, RN, BSN. 

## 2014-08-31 NOTE — Progress Notes (Signed)
Chart reviewed.   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 Presents with dyspnea, chest tightness, hypotension, respiratory distress, lactic acidosis. CT shows pneumonia.  Assessment/Plan:  Principal Problem:   CAP (community acquired pneumonia): continue rocephin, azithromycin. Add urine legionella and pneumococcal antigens  Active Problems:   AAT (alpha-1-antitrypsin) deficiency on chronic pred. No wheezing currently. Change back to maintenance prednisone 60 daily. If blood pressure drops, can give hydrocortisone.    Unspecified essential hypertension: meds held    Unspecified asthma, with exacerbation better. Breathing easier    Type II or unspecified type diabetes mellitus without mention of complication, uncontrolled. Was started on IV insulin overnight. AG high, but likely secondary to lactic acidosis rather than DKA. Will transition to [facility] levemir and novolog SSI. Metformin held due to acidosis and IV contrast. hgb a1c >8. Needs better outpatient control.    Sepsis: shock resolved.   Lactic acidosis  Stable to transfer to floor, but not yet discharge. If leaves today, must sign out AMA  Code Status:  full Family Communication:   Disposition Plan:  Transfer to floor  Consultants:  none  Procedures:   none  Antibiotics:  Ceftriaxone, azithromycin 9/10  HPI/Subjective: Feels great. Wants to leave to care for elderly grandmother. No dyspnea. No dizziness  Objective: Filed Vitals:   08/31/14 0800  BP: 161/86  Pulse: 85  Temp: 97.4 F (36.3 C)  Resp: 20    Intake/Output Summary (Last 24 hours) at 08/31/14 0951 Last data filed at 08/31/14 0800  Gross per 24 hour  Intake 3596.45 ml  Output    400 ml  Net 3196.45 ml   Filed Weights   08/30/14 1800  Weight: 115.4 kg (254 lb 6.6 oz)     Exam:   General:  Alert, oriented, sitting at edge of bed talking on phone. nontoxic  Cardiovascular: RRR without MGR  Respiratory: CTA without WRR  Abdomen: obese, S, NT, ND  Ext: no CCE  Basic Metabolic Panel:  Recent Labs Lab 08/30/14 1319 08/30/14 1352 08/30/14 1940 08/31/14 08/31/14 0345 08/31/14 0807  NA 136* 129*  --  133* 134* 137  K 3.5* 3.2*  --  4.8 4.4 3.8  CL 99 107  --  98 100 102  CO2 20  --   --  15* 19 19  GLUCOSE 132* 137*  --  509* 421* 221*  BUN 9 7  --  CREATININE 0.82 0.90 0.82 0.75 0.65 0.58  CALCIUM 8.9  --   --  7.9* 7.7* 8.0*   Liver Function Tests: No results found for this basename: AST, ALT, ALKPHOS, BILITOT, PROT, ALBUMIN,  in the last 168 hours No results found for this basename: LIPASE, AMYLASE,  in the last 168 hours No results found for this basename: AMMONIA,  in the last 168 hours CBC:  Recent Labs Lab 08/30/14 1319 08/30/14 1352 08/30/14 1940 08/31/14 0345  WBC 14.3*  --  13.6* 11.5*  HGB 14.3 14.6 12.5 12.2  HCT 40.3 43.0 36.9 34.8*  MCV 87.6  --  89.3 87.4  PLT 230  --  208 205   Cardiac Enzymes: No results found for this basename: CKTOTAL, CKMB, CKMBINDEX, TROPONINI,  in the last 168 hours BNP (last 3 results) No results found for this basename: PROBNP,  in the last 8760 hours CBG:  Recent Labs Lab 08/31/14 0513  08/31/14 0608 08/31/14 0713 08/31/14 0816 08/31/14 0917  GLUCAP 320* 273* 264* 212* 203*    Recent Results (from the past 240 hour(s))  CULTURE, BLOOD (ROUTINE X 2)     Status: None   Collection Time    08/30/14  2:20 PM      Result Value Ref Range Status   Specimen Description BLOOD ARM LEFT   Final   Special Requests BOTTLES DRAWN AEROBIC AND ANAEROBIC 10CC   Final   Culture  Setup Time     Final   Value: 08/30/2014 19:16     Performed at [facility]   Culture     Final   Value:        BLOOD CULTURE RECEIVED NO GROWTH TO DATE CULTURE WILL BE HELD FOR 5 DAYS BEFORE  ISSUING A FINAL NEGATIVE REPORT     Performed at [facility]   Report Status PENDING   Incomplete  CULTURE, BLOOD (ROUTINE X 2)     Status: None   Collection Time    08/30/14  2:30 PM      Result Value Ref Range Status   Specimen Description BLOOD LEFT FOREARM   Final   Special Requests BOTTLES DRAWN AEROBIC AND ANAEROBIC 10CC   Final   Culture  Setup Time     Final   Value: 08/30/2014 19:15     Performed at [facility]   Culture     Final   Value:        BLOOD CULTURE RECEIVED NO GROWTH TO DATE CULTURE WILL BE HELD FOR 5 DAYS BEFORE ISSUING A FINAL NEGATIVE REPORT     Performed at [facility]   Report Status PENDING   Incomplete  MRSA PCR SCREENING     Status: None   Collection Time    08/30/14  6:56 PM      Result Value Ref Range Status   MRSA by PCR NEGATIVE  NEGATIVE Final   Comment:            The GeneXpert MRSA Assay (FDA     approved for NASAL specimens     only), is one component of a     comprehensive MRSA colonization     surveillance program. It is not     intended to diagnose MRSA     infection nor to guide or     monitor treatment for     MRSA infections.     Studies: X-ray Chest Pa And Lateral   08/31/2014   CLINICAL DATA:  Chest pain and shortness of breath  EXAM: CHEST  2 VIEW  COMPARISON:  08/30/2014  FINDINGS: Cardiac shadow is stable. A right upper lobe infiltrate is again identified and stable. No acute bony abnormality is seen.  IMPRESSION: Stable appearance of right upper lobe infiltrate.   Electronically Signed   By: Alcide Clever M.D.   On: 08/31/2014 07:37   Dg Chest 2 View  08/30/2014   CLINICAL DATA:  Shortness of breath  EXAM: CHEST  2 VIEW  COMPARISON:  11/24/2012  FINDINGS: Cardiac shadow is within normal limits. The lungs are well aerated bilaterally. A focal right upper lobe infiltrate is noted along the border of the minor fissure. No acute bony abnormality is seen.  IMPRESSION: Right upper lobe pneumonia. Followup films  following appropriate therapy are recommended.   Electronically Signed   By: Alcide Clever M.D.   On: 08/30/2014 13:42   Ct Angio Chest Pe W/cm &/  or Wo Cm  08/30/2014   CLINICAL DATA:  Acute onset shortness of breath and chest tightness.  EXAM: CT ANGIOGRAPHY CHEST WITH CONTRAST  TECHNIQUE: Multidetector CT imaging of the chest was performed using the standard protocol during bolus administration of intravenous contrast. Multiplanar CT image reconstructions and MIPs were obtained to evaluate the vascular anatomy.  CONTRAST:  100 mL OMNIPAQUE IOHEXOL 350 MG/ML SOLN  COMPARISON:  PA and lateral chest earlier this same day and 11/24/2012  FINDINGS: The study is somewhat limited by bolus timing. No pulmonary embolus is identified. Heart size is upper normal. No pleural or pericardial effusion. No axillary, hilar or mediastinal lymphadenopathy. Lungs demonstrate airspace disease in the right upper lobe consistent with pneumonia. Mild dependent atelectasis is also identified. The lungs are otherwise unremarkable.  Visualized upper abdomen shows fatty infiltration of the liver. Imaged intra-abdominal contents are otherwise unremarkable. No bony abnormality is identified.  Review of the MIP images confirms the above findings.  IMPRESSION: Although somewhat limited by bolus timing, no pulmonary embolus is identified.  Right upper lobe airspace disease consistent with pneumonia.  Fatty infiltration of the liver.   Electronically Signed   By: Drusilla Kanner M.D.   On: 08/30/2014 15:38    Scheduled Meds: . azithromycin  500 mg Intravenous Q24H  . beclomethasone  2 puff Inhalation Daily  . cefTRIAXone (ROCEPHIN)  IV  1 g Intravenous Q24H  . enoxaparin (LOVENOX) injection  40 mg Subcutaneous Q24H  . methylPREDNISolone (SOLU-MEDROL) injection  60 mg Intravenous Q6H  . simvastatin  40 mg Oral q1800  . sodium chloride  3 mL Intravenous Q12H   Continuous Infusions: . sodium chloride 100 mL/hr at 08/30/14 1930  .  sodium chloride Stopped (08/31/14 0819)  . insulin (NOVOLIN-R) infusion 7.6 Units/hr (08/31/14 0817)    Time spent: 35 minutes  Aahan Marques L  [facility] Pager [phone]. If 7PM-7AM, please contact night-coverage at [email or web address], password [facility] 08/31/2014, 9:51 AM  LOS: 1 day

## 2014-08-31 NOTE — Progress Notes (Signed)
Merdis Delay, NP notified of patient's CBG.

## 2014-08-31 NOTE — Progress Notes (Signed)
Pt requested to leave AMA. MD notified. AMA paper completed, witnessed by 2 RNs. IV removed. All questions answered, pt verbalized understanding of risks. [place], [place], Charity fundraiser

## 2014-09-05 LAB — CULTURE, BLOOD (ROUTINE X 2)
CULTURE: NO GROWTH
Culture: NO GROWTH

## 2014-09-10 NOTE — Discharge Summary (Signed)
Physician Discharge Summary  Carla Hicks BJY:[id number] DOB: 1971/07/05 DOA: 08/30/2014  PCP: Doris Cheadle, MD  Admit date: 08/30/2014 Left AMA 08/31/14  Time spent: greater than 30 minutes   Discharge Diagnoses:  Principal Problem:   CAP (community acquired pneumonia) Active Problems:   AAT (alpha-1-antitrypsin) deficiency   Unspecified essential hypertension   Unspecified asthma, with exacerbation   Type II or unspecified type diabetes mellitus without mention of complication, uncontrolled   Morbid obesity   Severe sepsis with acute organ dysfunction   Lactic acidosis   Filed Weights   08/30/14 1800  Weight: 115.4 kg (254 lb 6.6 oz)    History of present illness:  43 y.o. female with a past medical history of alpha-1 antitrypsin deficiency, asthma, morbid obesity, on chronic prednisone therapy presenting to the emergency room with complaints of worsening cough and shortness of breath. She states that symptoms started overnight developing cough associated with yellow sputum production, shortness of breath, wheezing, chest pain precipitated by deep inspiration and cough as well as a generalized weakness, malaise, fatigue, overall feeling ill. She denies recent travel or sick contacts. A CT scan of lungs with IV contrast in the emergency room showed right upper lobe airspace disease consistent with pneumonia. She was given nebs, 25 mg of SoluMedrol and Levaquin, fluid resussitation. Tachycardic and hypotensive in ED  Hospital Course:  Stabilized overnight, next day left AMA to care for family members. See note from 9/11  Procedures:  none  Consultations:  none  Discharge Exam: Filed Vitals:   08/31/14 0800  BP: 161/86  Pulse: 85  Temp: 97.4 F (36.3 C)  Resp: 20    See progress note   Allergies  Allergen Reactions  . Penicillins Hives      The results of significant diagnostics from this hospitalization (including imaging, microbiology, ancillary and  laboratory) are listed below for reference.    Significant Diagnostic Studies: X-ray Chest Pa And Lateral   08/31/2014   CLINICAL DATA:  Chest pain and shortness of breath  EXAM: CHEST  2 VIEW  COMPARISON:  08/30/2014  FINDINGS: Cardiac shadow is stable. A right upper lobe infiltrate is again identified and stable. No acute bony abnormality is seen.  IMPRESSION: Stable appearance of right upper lobe infiltrate.   Electronically Signed   By: Alcide Clever M.D.   On: 08/31/2014 07:37   Dg Chest 2 View  08/30/2014   CLINICAL DATA:  Shortness of breath  EXAM: CHEST  2 VIEW  COMPARISON:  11/24/2012  FINDINGS: Cardiac shadow is within normal limits. The lungs are well aerated bilaterally. A focal right upper lobe infiltrate is noted along the border of the minor fissure. No acute bony abnormality is seen.  IMPRESSION: Right upper lobe pneumonia. Followup films following appropriate therapy are recommended.   Electronically Signed   By: Alcide Clever M.D.   On: 08/30/2014 13:42   Ct Angio Chest Pe W/cm &/or Wo Cm  08/30/2014   CLINICAL DATA:  Acute onset shortness of breath and chest tightness.  EXAM: CT ANGIOGRAPHY CHEST WITH CONTRAST  TECHNIQUE: Multidetector CT imaging of the chest was performed using the standard protocol during bolus administration of intravenous contrast. Multiplanar CT image reconstructions and MIPs were obtained to evaluate the vascular anatomy.  CONTRAST:  100 mL OMNIPAQUE IOHEXOL 350 MG/ML SOLN  COMPARISON:  PA and lateral chest earlier this same day and 11/24/2012  FINDINGS: The study is somewhat limited by bolus timing. No pulmonary embolus is identified. Heart size is  upper normal. No pleural or pericardial effusion. No axillary, hilar or mediastinal lymphadenopathy. Lungs demonstrate airspace disease in the right upper lobe consistent with pneumonia. Mild dependent atelectasis is also identified. The lungs are otherwise unremarkable.  Visualized upper abdomen shows fatty infiltration  of the liver. Imaged intra-abdominal contents are otherwise unremarkable. No bony abnormality is identified.  Review of the MIP images confirms the above findings.  IMPRESSION: Although somewhat limited by bolus timing, no pulmonary embolus is identified.  Right upper lobe airspace disease consistent with pneumonia.  Fatty infiltration of the liver.   Electronically Signed   By: Drusilla Kanner M.D.   On: 08/30/2014 15:38    Microbiology: No results found for this or any previous visit (from the past 240 hour(s)).   Labs: Basic Metabolic Panel: No results found for this basename: NA, K, CL, CO2, GLUCOSE, BUN, CREATININE, CALCIUM, MG, PHOS,  in the last 168 hours Liver Function Tests: No results found for this basename: AST, ALT, ALKPHOS, BILITOT, PROT, ALBUMIN,  in the last 168 hours No results found for this basename: LIPASE, AMYLASE,  in the last 168 hours No results found for this basename: AMMONIA,  in the last 168 hours CBC: No results found for this basename: WBC, NEUTROABS, HGB, HCT, MCV, PLT,  in the last 168 hours Cardiac Enzymes: No results found for this basename: CKTOTAL, CKMB, CKMBINDEX, TROPONINI,  in the last 168 hours BNP: BNP (last 3 results) No results found for this basename: PROBNP,  in the last 8760 hours CBG: No results found for this basename: GLUCAP,  in the last 168 hours     Signed:  Teven Mittman L  [facility] 09/10/2014, 2:14 PM

## 2014-10-22 ENCOUNTER — Encounter (HOSPITAL_COMMUNITY): Payer: Self-pay | Admitting: Emergency Medicine

## 2014-10-24 ENCOUNTER — Ambulatory Visit: Payer: Self-pay | Attending: Internal Medicine | Admitting: Internal Medicine

## 2014-10-24 ENCOUNTER — Encounter: Payer: Self-pay | Admitting: Internal Medicine

## 2014-10-24 VITALS — BP 122/82 | HR 101 | Temp 98.0°F | Resp 16 | Wt 258.6 lb

## 2014-10-24 DIAGNOSIS — E559 Vitamin D deficiency, unspecified: Secondary | ICD-10-CM | POA: Insufficient documentation

## 2014-10-24 DIAGNOSIS — E139 Other specified diabetes mellitus without complications: Secondary | ICD-10-CM

## 2014-10-24 DIAGNOSIS — M25562 Pain in left knee: Secondary | ICD-10-CM | POA: Insufficient documentation

## 2014-10-24 DIAGNOSIS — E1165 Type 2 diabetes mellitus with hyperglycemia: Secondary | ICD-10-CM | POA: Insufficient documentation

## 2014-10-24 DIAGNOSIS — J439 Emphysema, unspecified: Secondary | ICD-10-CM | POA: Insufficient documentation

## 2014-10-24 DIAGNOSIS — I10 Essential (primary) hypertension: Secondary | ICD-10-CM | POA: Insufficient documentation

## 2014-10-24 DIAGNOSIS — E8801 Alpha-1-antitrypsin deficiency: Secondary | ICD-10-CM | POA: Insufficient documentation

## 2014-10-24 DIAGNOSIS — J45909 Unspecified asthma, uncomplicated: Secondary | ICD-10-CM | POA: Insufficient documentation

## 2014-10-24 DIAGNOSIS — Z7952 Long term (current) use of systemic steroids: Secondary | ICD-10-CM | POA: Insufficient documentation

## 2014-10-24 LAB — COMPLETE METABOLIC PANEL WITH GFR
ALT: 16 U/L (ref 0–35)
AST: 11 U/L (ref 0–37)
Albumin: 3.7 g/dL (ref 3.5–5.2)
Alkaline Phosphatase: 41 U/L (ref 39–117)
BILIRUBIN TOTAL: 0.3 mg/dL (ref 0.2–1.2)
BUN: 10 mg/dL (ref 6–23)
CO2: 14 mEq/L — ABNORMAL LOW (ref 19–32)
Calcium: 9 mg/dL (ref 8.4–10.5)
Chloride: 105 mEq/L (ref 96–112)
Creat: 0.91 mg/dL (ref 0.50–1.10)
GFR, EST NON AFRICAN AMERICAN: 78 mL/min
GFR, Est African American: 89 mL/min
GLUCOSE: 252 mg/dL — AB (ref 70–99)
Potassium: 5.7 mEq/L — ABNORMAL HIGH (ref 3.5–5.3)
SODIUM: 137 meq/L (ref 135–145)
Total Protein: 6.3 g/dL (ref 6.0–8.3)

## 2014-10-24 LAB — LIPID PANEL
CHOLESTEROL: 144 mg/dL (ref 0–200)
HDL: 92 mg/dL (ref >39)
LDL Cholesterol: 24 mg/dL (ref 0–99)
Total CHOL/HDL Ratio: 1.6 Ratio
Triglycerides: 141 mg/dL (ref <150)
VLDL: 28 mg/dL (ref 0–40)

## 2014-10-24 LAB — GLUCOSE, POCT (MANUAL RESULT ENTRY): POC Glucose: 273 mg/dl — AB (ref 70–99)

## 2014-10-24 MED ORDER — TRAMADOL HCL 50 MG PO TABS: 100.0000 mg | ORAL_TABLET | Freq: Three times a day (TID) | ORAL | Status: DC | PRN

## 2014-10-24 MED ORDER — INSULIN ASPART 100 UNIT/ML ~~LOC~~ SOLN
10.0000 [IU] | Freq: Once | SUBCUTANEOUS | Status: AC
  Administered 2014-10-24: 10 [IU] via SUBCUTANEOUS

## 2014-10-24 MED ORDER — SITAGLIPTIN PHOS-METFORMIN HCL 50-1000 MG PO TABS: 1.0000 | ORAL_TABLET | Freq: Two times a day (BID) | ORAL | Status: DC

## 2014-10-24 MED ORDER — DAPAGLIFLOZIN PROPANEDIOL 5 MG PO TABS: 5.0000 mg | ORAL_TABLET | Freq: Every day | ORAL | Status: DC

## 2014-10-24 MED ORDER — PREDNISONE 20 MG PO TABS: 60.0000 mg | ORAL_TABLET | Freq: Every day | ORAL | Status: DC

## 2014-10-24 MED ORDER — LOSARTAN POTASSIUM-HCTZ 100-25 MG PO TABS: 1.0000 | ORAL_TABLET | Freq: Every day | ORAL | Status: DC

## 2014-10-24 NOTE — Progress Notes (Signed)
MRN: [id number] Name: Carla Hicks  Sex: female Age: 43 y.o. DOB: 09-02-1971  Allergies: Penicillins  Chief Complaint  Patient presents with  . Follow-up    HPI: Patient is 43 y.o. female who  has history of alpha-1 antitrypsin deficiency on chronic prednisone, diabetes, hypertension, hyperlipidemia asthma comes today for followup, patient was referred to pulmonology on the last office visit but she has not been able to make her appointment. Today patient is requesting refill on her medications, as per patient her she is taking metformin 3000 mg 3 times a day, today her blood sugar level was elevated as per patient she has not taken her medication today, I have counseled patient not to exceed the maximum dose of medication, her blood pressure is well controlled denies any headache dizziness chest pain shortness of breath she has been using her inhalers, denies smoking cigarettes, she has history of chronic knee pain and is requesting refill on her pain medication.  Past Medical History  Diagnosis Date  . Diabetes mellitus   . Hypertension   . Urinary bladder disorder   . Asthma   . Emphysema     Past Surgical History  Procedure Laterality Date  . Cesarean section        Medication List       This list is accurate as of: 10/24/14  9:49 AM.  Always use your most recent med list.               albuterol 108 (90 BASE) MCG/ACT inhaler  Commonly known as:  PROVENTIL HFA;VENTOLIN HFA  Inhale 2 puffs into the lungs every 6 (six) hours as needed for wheezing or shortness of breath. Sob     albuterol (5 MG/ML) 0.5% nebulizer solution  Commonly known as:  PROVENTIL  Take 0.5 mLs (2.5 mg total) by nebulization every 4 (four) hours as needed for wheezing or shortness of breath. Sob     beclomethasone 80 MCG/ACT inhaler  Commonly known as:  QVAR  Inhale 2 puffs into the lungs daily.     dapagliflozin propanediol 5 MG Tabs tablet  Commonly known as:  FARXIGA  Take 5 mg by  mouth daily.     levofloxacin 750 MG tablet  Commonly known as:  LEVAQUIN  Take 1 tablet (750 mg total) by mouth daily.     losartan-hydrochlorothiazide 100-25 MG per tablet  Commonly known as:  HYZAAR  Take 1 tablet by mouth daily.     metFORMIN 1000 MG tablet  Commonly known as:  GLUCOPHAGE  Take 1 tablet (1,000 mg total) by mouth 2 (two) times daily with a meal.     multivitamin with minerals Tabs tablet  Take 1 tablet by mouth daily.     oxyCODONE-acetaminophen 10-325 MG per tablet  Commonly known as:  PERCOCET  Take 1 tablet by mouth every 8 (eight) hours as needed for pain.     predniSONE 20 MG tablet  Commonly known as:  DELTASONE  Take 3 tablets (60 mg total) by mouth daily.     simvastatin 40 MG tablet  Commonly known as:  ZOCOR  Take 40 mg by mouth daily at 6 PM.     sitaGLIPtin-metformin 50-1000 MG per tablet  Commonly known as:  JANUMET  Take 1 tablet by mouth 2 (two) times daily with a meal.     traMADol 50 MG tablet  Commonly known as:  ULTRAM  Take 2 tablets (100 mg total) by mouth every 8 (eight) hours  as needed for moderate pain.     Vitamin D (Ergocalciferol) 50000 UNITS Caps capsule  Commonly known as:  DRISDOL  Take 1 capsule (50,000 Units total) by mouth every 7 (seven) days.        Meds ordered this encounter  Medications  . insulin aspart (novoLOG) injection 10 Units    Sig:   . losartan-hydrochlorothiazide (HYZAAR) 100-25 MG per tablet    Sig: Take 1 tablet by mouth daily.    Dispense:  30 tablet    Refill:  3  . predniSONE (DELTASONE) 20 MG tablet    Sig: Take 3 tablets (60 mg total) by mouth daily.    Dispense:  180 tablet    Refill:  3  . traMADol (ULTRAM) 50 MG tablet    Sig: Take 2 tablets (100 mg total) by mouth every 8 (eight) hours as needed for moderate pain.    Dispense:  60 tablet    Refill:  0  . sitaGLIPtin-metformin (JANUMET) 50-1000 MG per tablet    Sig: Take 1 tablet by mouth 2 (two) times daily with a meal.     Dispense:  60 tablet    Refill:  3  . dapagliflozin propanediol (FARXIGA) 5 MG TABS tablet    Sig: Take 5 mg by mouth daily.    Dispense:  30 tablet    Refill:  3     There is no immunization history on file for this patient.  History reviewed. No pertinent family history.  History  Substance Use Topics  . Smoking status: Former Games developer  . Smokeless tobacco: Not on file  . Alcohol Use: No    Review of Systems   As noted in HPI  Filed Vitals:   10/24/14 0917  BP: 122/82  Pulse: 101  Temp: 98 F (36.7 C)  Resp: 16    Physical Exam  Physical Exam  Constitutional: No distress.  Obese female not in acute distress   Eyes: EOM are normal. Pupils are equal, round, and reactive to light.  Neck: Neck supple.  Cardiovascular: Normal rate and regular rhythm.   Pulmonary/Chest: Breath sounds normal. No respiratory distress. She has no rales.  Minimal wheezing  Musculoskeletal: She exhibits no edema.    CBC    Component Value Date/Time   WBC 11.5* 08/31/2014 0345   RBC 3.98 08/31/2014 0345   HGB 12.2 08/31/2014 0345   HCT 34.8* 08/31/2014 0345   PLT 205 08/31/2014 0345   MCV 87.4 08/31/2014 0345   LYMPHSABS 4.3* 03/27/2014 1118   MONOABS 0.6 03/27/2014 1118   EOSABS 0.0 03/27/2014 1118   BASOSABS 0.0 03/27/2014 1118    CMP     Component Value Date/Time   NA 137 08/31/2014 0807   K 3.8 08/31/2014 0807   CL 102 08/31/2014 0807   CO2 19 08/31/2014 0807   GLUCOSE 221* 08/31/2014 0807   BUN 8 08/31/2014 0807   CREATININE 0.58 08/31/2014 0807   CREATININE 0.63 03/27/2014 1118   CALCIUM 8.0* 08/31/2014 0807   PROT 6.8 03/27/2014 1118   ALBUMIN 3.8 03/27/2014 1118   AST 9 03/27/2014 1118   ALT 16 03/27/2014 1118   ALKPHOS 53 03/27/2014 1118   BILITOT 0.6 03/27/2014 1118   GFRNONAA >90 08/31/2014 0807   GFRNONAA >89 03/27/2014 1118   GFRAA >90 08/31/2014 0807   GFRAA >89 03/27/2014 1118    Lab Results  Component Value Date/Time   CHOL 200 03/27/2014  11:18 AM    No components found for:  HGA1C  Lab Results  Component Value Date/Time   AST 9 03/27/2014 11:18 AM    Assessment and Plan  Other specified diabetes mellitus without complications - Plan:  Her diabetes is uncontrolled, her last hemoglobin A1c was 8.8%, I have switched her to combination of Januvia and metformin, also started on FARXIGA, advise patient for diabetes meal planning, continue with the home fingerstick glucose log, will repeat A1c in 3 months. Glucose (CBG), insulin aspart (novoLOG) injection 10 Units, sitaGLIPtin-metformin (JANUMET) 50-1000 MG per tablet, dapagliflozin propanediol (FARXIGA) 5 MG TABS tablet, COMPLETE METABOLIC PANEL WITH GFR, Lipid panel  Essential hypertension, benign - Plan:blood pressure is well controlled continue with current meds losartan-hydrochlorothiazide (HYZAAR) 100-25 MG per tablet, COMPLETE METABOLIC PANEL WITH GFR  Alpha-1-antitrypsin deficiency - Plan: Ambulatory referral to Pulmonology, predniSONE (DELTASONE) 20 MG tabletrefill done she is also referred back to pulmonology  Morbid obesity Advised patient for diet modification  Left knee pain Patient is given refill on her pain medication. If worsening symptoms consider referral to Orthopedics.  Asthma, chronic, unspecified asthma severity, uncomplicated - Plan:patient is on Qvar as well as albuterol when necessary Ambulatory referral to Pulmonology  Vitamin D deficiency - Plan:will repeat her Vit D  25 hydroxy (rtn osteoporosis monitoring)   Health Maintenance Patient declines flu shot   Return in about 3 months (around 01/24/2015) for diabetes, hypertension, hyperipidemia, asthma.  Doris CheadleADVANI, Artesha Wemhoff, MD

## 2014-10-24 NOTE — Progress Notes (Signed)
Patient here for follow up on her diabetes Patient states she is taking 3000mg  TID of metformin for  The tingling and numbness to her fingers and toes Patient states she is also retaining water later and that her sugars Have been running high

## 2014-10-24 NOTE — Patient Instructions (Signed)
DASH Eating Plan DASH stands for "Dietary Approaches to Stop Hypertension." The DASH eating plan is a healthy eating plan that has been shown to reduce high blood pressure (hypertension). Additional health benefits may include reducing the risk of type 2 diabetes mellitus, heart disease, and stroke. The DASH eating plan may also help with weight loss. WHAT DO I NEED TO KNOW ABOUT THE DASH EATING PLAN? For the DASH eating plan, you will follow these general guidelines:  Choose foods with a percent daily value for sodium of less than 5% (as listed on the food label).  Use salt-free seasonings or herbs instead of table salt or sea salt.  Check with your health care provider or pharmacist before using salt substitutes.  Eat lower-sodium products, often labeled as "lower sodium" or "no salt added."  Eat fresh foods.  Eat more vegetables, fruits, and low-fat dairy products.  Choose whole grains. Look for the word "whole" as the first word in the ingredient list.  Choose fish and skinless chicken or turkey more often than red meat. Limit fish, poultry, and meat to 6 oz (170 g) each day.  Limit sweets, desserts, sugars, and sugary drinks.  Choose heart-healthy fats.  Limit cheese to 1 oz (28 g) per day.  Eat more home-cooked food and less restaurant, buffet, and fast food.  Limit fried foods.  Cook foods using methods other than frying.  Limit canned vegetables. If you do use them, rinse them well to decrease the sodium.  When eating at a restaurant, ask that your food be prepared with less salt, or no salt if possible. WHAT FOODS CAN I EAT? Seek help from a dietitian for individual calorie needs. Grains Whole grain or whole wheat bread. Brown rice. Whole grain or whole wheat pasta. Quinoa, bulgur, and whole grain cereals. Low-sodium cereals. Corn or whole wheat flour tortillas. Whole grain cornbread. Whole grain crackers. Low-sodium crackers. Vegetables Fresh or frozen vegetables  (raw, steamed, roasted, or grilled). Low-sodium or reduced-sodium tomato and vegetable juices. Low-sodium or reduced-sodium tomato sauce and paste. Low-sodium or reduced-sodium canned vegetables.  Fruits All fresh, canned (in natural juice), or frozen fruits. Meat and Other Protein Products Ground beef (85% or leaner), grass-fed beef, or beef trimmed of fat. Skinless chicken or turkey. Ground chicken or turkey. Pork trimmed of fat. All fish and seafood. Eggs. Dried beans, peas, or lentils. Unsalted nuts and seeds. Unsalted canned beans. Dairy Low-fat dairy products, such as skim or 1% milk, 2% or reduced-fat cheeses, low-fat ricotta or cottage cheese, or plain low-fat yogurt. Low-sodium or reduced-sodium cheeses. Fats and Oils Tub margarines without trans fats. Light or reduced-fat mayonnaise and salad dressings (reduced sodium). Avocado. Safflower, olive, or canola oils. Natural peanut or almond butter. Other Unsalted popcorn and pretzels. The items listed above may not be a complete list of recommended foods or beverages. Contact your dietitian for more options. WHAT FOODS ARE NOT RECOMMENDED? Grains White bread. White pasta. White rice. Refined cornbread. Bagels and croissants. Crackers that contain trans fat. Vegetables Creamed or fried vegetables. Vegetables in a cheese sauce. Regular canned vegetables. Regular canned tomato sauce and paste. Regular tomato and vegetable juices. Fruits Dried fruits. Canned fruit in light or heavy syrup. Fruit juice. Meat and Other Protein Products Fatty cuts of meat. Ribs, chicken wings, bacon, sausage, bologna, salami, chitterlings, fatback, hot dogs, bratwurst, and packaged luncheon meats. Salted nuts and seeds. Canned beans with salt. Dairy Whole or 2% milk, cream, half-and-half, and cream cheese. Whole-fat or sweetened yogurt. Full-fat   cheeses or blue cheese. Nondairy creamers and whipped toppings. Processed cheese, cheese spreads, or cheese  curds. Condiments Onion and garlic salt, seasoned salt, table salt, and sea salt. Canned and packaged gravies. Worcestershire sauce. Tartar sauce. Barbecue sauce. Teriyaki sauce. Soy sauce, including reduced sodium. Steak sauce. Fish sauce. Oyster sauce. Cocktail sauce. Horseradish. Ketchup and mustard. Meat flavorings and tenderizers. Bouillon cubes. Hot sauce. Tabasco sauce. Marinades. Taco seasonings. Relishes. Fats and Oils Butter, stick margarine, lard, shortening, ghee, and bacon fat. Coconut, palm kernel, or palm oils. Regular salad dressings. Other Pickles and olives. Salted popcorn and pretzels. The items listed above may not be a complete list of foods and beverages to avoid. Contact your dietitian for more information. WHERE CAN I FIND MORE INFORMATION? National [employer], [employer], [employer]: www.nhlbi.nih.gov/health/health-topics/topics/dash/ Document Released: 11/26/2011 Document Revised: 04/23/2014 Document Reviewed: 10/11/2013 ExitCare Patient Information 2015 [employer]. This information is not intended to replace advice given to you by your health care provider. Make sure you discuss any questions you have with your health care provider. Diabetes Mellitus and Food It is important for you to manage your blood sugar (glucose) level. Your blood glucose level can be greatly affected by what you eat. Eating healthier foods in the appropriate amounts throughout the day at about the same time each day will help you control your blood glucose level. It can also help slow or prevent worsening of your diabetes mellitus. Healthy eating may even help you improve the level of your blood pressure and reach or maintain a healthy weight.  HOW CAN FOOD AFFECT ME? Carbohydrates Carbohydrates affect your blood glucose level more than any other type of food. Your dietitian will help you determine how many carbohydrates to eat at each meal and teach you how to count carbohydrates. Counting  carbohydrates is important to keep your blood glucose at a healthy level, especially if you are using insulin or taking certain medicines for diabetes mellitus. Alcohol Alcohol can cause sudden decreases in blood glucose (hypoglycemia), especially if you use insulin or take certain medicines for diabetes mellitus. Hypoglycemia can be a life-threatening condition. Symptoms of hypoglycemia (sleepiness, dizziness, and disorientation) are similar to symptoms of having too much alcohol.  If your health care provider has given you approval to drink alcohol, do so in moderation and use the following guidelines:  Women should not have more than one drink per day, and men should not have more than two drinks per day. One drink is equal to:  12 oz of beer.  5 oz of wine.  1 oz of hard liquor.  Do not drink on an empty stomach.  Keep yourself hydrated. Have water, diet soda, or unsweetened iced tea.  Regular soda, juice, and other mixers might contain a lot of carbohydrates and should be counted. WHAT FOODS ARE NOT RECOMMENDED? As you make food choices, it is important to remember that all foods are not the same. Some foods have fewer nutrients per serving than other foods, even though they might have the same number of calories or carbohydrates. It is difficult to get your body what it needs when you eat foods with fewer nutrients. Examples of foods that you should avoid that are high in calories and carbohydrates but low in nutrients include:  Trans fats (most processed foods list trans fats on the Nutrition Facts label).  Regular soda.  Juice.  Candy.  Sweets, such as cake, pie, doughnuts, and cookies.  Fried foods. WHAT FOODS CAN I EAT? Have nutrient-rich foods,   which will nourish your body and keep you healthy. The food you should eat also will depend on several factors, including:  The calories you need.  The medicines you take.  Your weight.  Your blood glucose level.  Your  blood pressure level.  Your cholesterol level. You also should eat a variety of foods, including:  Protein, such as meat, poultry, fish, tofu, nuts, and seeds (lean animal proteins are best).  Fruits.  Vegetables.  Dairy products, such as milk, cheese, and yogurt (low fat is best).  Breads, grains, pasta, cereal, rice, and beans.  Fats such as olive oil, trans fat-free margarine, canola oil, avocado, and olives. DOES EVERYONE WITH DIABETES MELLITUS HAVE THE SAME MEAL PLAN? Because every person with diabetes mellitus is different, there is not one meal plan that works for everyone. It is very important that you meet with a dietitian who will help you create a meal plan that is just right for you. Document Released: 09/03/2005 Document Revised: 12/12/2013 Document Reviewed: 11/03/2013 Exit[employer] Patient Information 2015 [employer]. This information is not intended to replace advice given to you by your health care provider. Make sure you discuss any questions you have with your health care provider.  

## 2014-10-25 LAB — VITAMIN D 25 HYDROXY (VIT D DEFICIENCY, FRACTURES): Vit D, 25-Hydroxy: 13 ng/mL — ABNORMAL LOW (ref 30–89)

## 2014-11-01 ENCOUNTER — Telehealth: Payer: Self-pay

## 2014-11-01 DIAGNOSIS — E139 Other specified diabetes mellitus without complications: Secondary | ICD-10-CM

## 2014-11-01 MED ORDER — VITAMIN D (ERGOCALCIFEROL) 1.25 MG (50000 UNIT) PO CAPS: 50000.0000 [IU] | ORAL_CAPSULE | ORAL | Status: DC

## 2014-11-01 MED ORDER — DAPAGLIFLOZIN PROPANEDIOL 5 MG PO TABS: 5.0000 mg | ORAL_TABLET | Freq: Every day | ORAL | Status: DC

## 2014-11-01 MED ORDER — SITAGLIPTIN PHOS-METFORMIN HCL 50-1000 MG PO TABS: 1.0000 | ORAL_TABLET | Freq: Two times a day (BID) | ORAL | Status: DC

## 2014-11-01 NOTE — Telephone Encounter (Signed)
-----   Message from Doris Cheadle, MD sent at 10/25/2014 10:25 AM EST ----- Blood work reviewed, noticed low vitamin D, call patient advise to start ergocalciferol 50,000 units once a week for the duration of  12 weeks.  also noticed elevated potassium level, advise patient to avoid high potassium containing foods, will repeat the blood chemistry on the following visit.

## 2014-11-08 ENCOUNTER — Institutional Professional Consult (permissible substitution): Payer: Self-pay | Admitting: Internal Medicine

## 2014-11-19 ENCOUNTER — Institutional Professional Consult (permissible substitution): Payer: Self-pay | Admitting: Internal Medicine

## 2014-12-11 ENCOUNTER — Emergency Department (HOSPITAL_COMMUNITY)
Admission: EM | Admit: 2014-12-11 | Discharge: 2014-12-11 | Disposition: A | Discharge status: Home / Self Care | Payer: Self-pay | Attending: Emergency Medicine | Admitting: Emergency Medicine

## 2014-12-11 ENCOUNTER — Encounter (HOSPITAL_COMMUNITY): Payer: Self-pay | Admitting: Emergency Medicine

## 2014-12-11 ENCOUNTER — Emergency Department (HOSPITAL_COMMUNITY): Payer: Self-pay

## 2014-12-11 DIAGNOSIS — Z79899 Other long term (current) drug therapy: Secondary | ICD-10-CM | POA: Insufficient documentation

## 2014-12-11 DIAGNOSIS — Z7951 Long term (current) use of inhaled steroids: Secondary | ICD-10-CM | POA: Insufficient documentation

## 2014-12-11 DIAGNOSIS — Z87891 Personal history of nicotine dependence: Secondary | ICD-10-CM | POA: Insufficient documentation

## 2014-12-11 DIAGNOSIS — E119 Type 2 diabetes mellitus without complications: Secondary | ICD-10-CM | POA: Insufficient documentation

## 2014-12-11 DIAGNOSIS — R1010 Upper abdominal pain, unspecified: Secondary | ICD-10-CM

## 2014-12-11 DIAGNOSIS — R1011 Right upper quadrant pain: Secondary | ICD-10-CM | POA: Insufficient documentation

## 2014-12-11 DIAGNOSIS — I10 Essential (primary) hypertension: Secondary | ICD-10-CM | POA: Insufficient documentation

## 2014-12-11 DIAGNOSIS — Z87448 Personal history of other diseases of urinary system: Secondary | ICD-10-CM | POA: Insufficient documentation

## 2014-12-11 DIAGNOSIS — E876 Hypokalemia: Secondary | ICD-10-CM | POA: Insufficient documentation

## 2014-12-11 DIAGNOSIS — Z7952 Long term (current) use of systemic steroids: Secondary | ICD-10-CM | POA: Insufficient documentation

## 2014-12-11 DIAGNOSIS — J45909 Unspecified asthma, uncomplicated: Secondary | ICD-10-CM | POA: Insufficient documentation

## 2014-12-11 DIAGNOSIS — Z880 Allergy status to penicillin: Secondary | ICD-10-CM | POA: Insufficient documentation

## 2014-12-11 DIAGNOSIS — K859 Acute pancreatitis, unspecified: Secondary | ICD-10-CM | POA: Insufficient documentation

## 2014-12-11 LAB — URINALYSIS, ROUTINE W REFLEX MICROSCOPIC
Bilirubin Urine: NEGATIVE
GLUCOSE, UA: NEGATIVE mg/dL
Ketones, ur: NEGATIVE mg/dL
NITRITE: NEGATIVE
PH: 6.5 (ref 5.0–8.0)
Protein, ur: 30 mg/dL — AB
SPECIFIC GRAVITY, URINE: 1.015 (ref 1.005–1.030)
Urobilinogen, UA: 0.2 mg/dL (ref 0.0–1.0)

## 2014-12-11 LAB — COMPREHENSIVE METABOLIC PANEL
ALT: 17 U/L (ref 0–35)
AST: 20 U/L (ref 0–37)
Albumin: 3.1 g/dL — ABNORMAL LOW (ref 3.5–5.2)
Alkaline Phosphatase: 56 U/L (ref 39–117)
Anion gap: 11 (ref 5–15)
BILIRUBIN TOTAL: 0.7 mg/dL (ref 0.3–1.2)
BUN: 8 mg/dL (ref 6–23)
CALCIUM: 9 mg/dL (ref 8.4–10.5)
CHLORIDE: 101 meq/L (ref 96–112)
CO2: 26 mmol/L (ref 19–32)
Creatinine, Ser: 0.76 mg/dL (ref 0.50–1.10)
GLUCOSE: 127 mg/dL — AB (ref 70–99)
Potassium: 3.4 mmol/L — ABNORMAL LOW (ref 3.5–5.1)
Sodium: 138 mmol/L (ref 135–145)
Total Protein: 6.2 g/dL (ref 6.0–8.3)

## 2014-12-11 LAB — CBC WITH DIFFERENTIAL/PLATELET
Basophils Absolute: 0 10*3/uL (ref 0.0–0.1)
Basophils Relative: 0 % (ref 0–1)
EOS PCT: 0 % (ref 0–5)
Eosinophils Absolute: 0 10*3/uL (ref 0.0–0.7)
HCT: 41.2 % (ref 36.0–46.0)
HEMOGLOBIN: 14 g/dL (ref 12.0–15.0)
LYMPHS ABS: 2.8 10*3/uL (ref 0.7–4.0)
LYMPHS PCT: 30 % (ref 12–46)
MCH: 30.4 pg (ref 26.0–34.0)
MCHC: 34 g/dL (ref 30.0–36.0)
MCV: 89.6 fL (ref 78.0–100.0)
MONO ABS: 0.7 10*3/uL (ref 0.1–1.0)
Monocytes Relative: 8 % (ref 3–12)
NEUTROS ABS: 5.8 10*3/uL (ref 1.7–7.7)
Neutrophils Relative %: 62 % (ref 43–77)
Platelets: 213 10*3/uL (ref 150–400)
RBC: 4.6 MIL/uL (ref 3.87–5.11)
RDW: 12.7 % (ref 11.5–15.5)
WBC: 9.4 10*3/uL (ref 4.0–10.5)

## 2014-12-11 LAB — LIPASE, BLOOD: Lipase: 200 U/L — ABNORMAL HIGH (ref 11–59)

## 2014-12-11 LAB — URINE MICROSCOPIC-ADD ON

## 2014-12-11 LAB — I-STAT TROPONIN, ED: Troponin i, poc: 0 ng/mL (ref 0.00–0.08)

## 2014-12-11 MED ORDER — ONDANSETRON HCL 4 MG/2ML IJ SOLN
4.0000 mg | Freq: Once | INTRAMUSCULAR | Status: AC
  Administered 2014-12-11: 4 mg via INTRAVENOUS
  Filled 2014-12-11: qty 2

## 2014-12-11 MED ORDER — HYDROMORPHONE HCL 1 MG/ML IJ SOLN
1.0000 mg | Freq: Once | INTRAMUSCULAR | Status: AC
  Administered 2014-12-11: 1 mg via INTRAVENOUS
  Filled 2014-12-11: qty 1

## 2014-12-11 MED ORDER — SODIUM CHLORIDE 0.9 % IV BOLUS (SEPSIS)
1000.0000 mL | Freq: Once | INTRAVENOUS | Status: AC
  Administered 2014-12-11: 1000 mL via INTRAVENOUS

## 2014-12-11 MED ORDER — ACETAMINOPHEN 325 MG PO TABS
650.0000 mg | ORAL_TABLET | Freq: Once | ORAL | Status: AC
  Administered 2014-12-11: 650 mg via ORAL
  Filled 2014-12-11: qty 2

## 2014-12-11 MED ORDER — IOHEXOL 300 MG/ML  SOLN
25.0000 mL | INTRAMUSCULAR | Status: AC
  Administered 2014-12-11: 25 mL via ORAL

## 2014-12-11 MED ORDER — POTASSIUM CHLORIDE CRYS ER 20 MEQ PO TBCR
40.0000 meq | EXTENDED_RELEASE_TABLET | Freq: Once | ORAL | Status: AC
  Administered 2014-12-11: 40 meq via ORAL
  Filled 2014-12-11: qty 2

## 2014-12-11 MED ORDER — OXYCODONE-ACETAMINOPHEN 5-325 MG PO TABS
1.0000 | ORAL_TABLET | ORAL | Status: DC | PRN
Start: 2014-12-11 — End: 2015-08-01

## 2014-12-11 NOTE — ED Notes (Signed)
Patient transported to Ultrasound 

## 2014-12-11 NOTE — ED Provider Notes (Signed)
CSN: [id number]     Arrival date & time 12/11/14  1411 History   First MD Initiated Contact with Patient 12/11/14 1503     Chief Complaint  Patient presents with  . Abdominal Pain     (Consider location/radiation/quality/duration/timing/severity/associated sxs/prior Treatment) HPI  43 year old female presents with upper abdominal pain that radiates to the right side of her abdomen since last night. Is a constant, sharp pain. Is 10/10. Has been having nausea without vomiting. Nothing makes the pain better or worse. Has not eaten since the pain started. Has not tried anything for the pain. Denies any back pain. No diarrhea or constipation. No cough more than her normal, chronic cough from emphysema. No shortness of breath or chest pain. Felt a subjective fever last night. Denies similar symptoms in past.  Past Medical History  Diagnosis Date  . Diabetes mellitus   . Hypertension   . Urinary bladder disorder   . Asthma   . Emphysema    Past Surgical History  Procedure Laterality Date  . Cesarean section     History reviewed. No pertinent family history. History  Substance Use Topics  . Smoking status: Former Games developermoker  . Smokeless tobacco: Not on file  . Alcohol Use: No   OB History    Gravida Para Term Preterm AB TAB SAB Ectopic Multiple Living   5 4 4       4      Review of Systems  Constitutional: Positive for fever.  Respiratory: Positive for cough (chronic). Negative for shortness of breath.   Cardiovascular: Negative for chest pain.  Gastrointestinal: Positive for nausea and abdominal pain. Negative for vomiting, diarrhea and constipation.  Genitourinary: Negative for dysuria and hematuria.  Musculoskeletal: Negative for back pain.  All other systems reviewed and are negative.     Allergies  Penicillins  Home Medications   Prior to Admission medications   Medication Sig Start Date End Date Taking? Authorizing Provider  albuterol (PROVENTIL HFA;VENTOLIN HFA) 108  (90 BASE) MCG/ACT inhaler Inhale 2 puffs into the lungs every 6 (six) hours as needed for wheezing or shortness of breath. Sob 03/27/14  Yes Richarda OverlieNayana Abrol, MD  albuterol (PROVENTIL) (5 MG/ML) 0.5% nebulizer solution Take 0.5 mLs (2.5 mg total) by nebulization every 4 (four) hours as needed for wheezing or shortness of breath. Sob 03/27/14  Yes Richarda OverlieNayana Abrol, MD  beclomethasone (QVAR) 80 MCG/ACT inhaler Inhale 2 puffs into the lungs daily.   Yes Historical Provider, MD  dapagliflozin propanediol (FARXIGA) 5 MG TABS tablet Take 5 mg by mouth daily. 11/01/14  Yes Doris Cheadleeepak Advani, MD  losartan-hydrochlorothiazide (HYZAAR) 100-25 MG per tablet Take 1 tablet by mouth daily. 10/24/14  Yes Doris Cheadleeepak Advani, MD  metFORMIN (GLUCOPHAGE) 500 MG tablet Take 500 mg by mouth 2 (two) times daily with a meal.   Yes Historical Provider, MD  Multiple Vitamin (MULTIVITAMIN WITH MINERALS) TABS tablet Take 1 tablet by mouth daily.   Yes Historical Provider, MD  oxyCODONE-acetaminophen (PERCOCET) 10-325 MG per tablet Take 1 tablet by mouth every 8 (eight) hours as needed for pain. 10/17/13  Yes Alison MurrayAlma M Devine, MD  predniSONE (DELTASONE) 20 MG tablet Take 3 tablets (60 mg total) by mouth daily. 10/24/14  Yes Doris Cheadleeepak Advani, MD  simvastatin (ZOCOR) 40 MG tablet Take 40 mg by mouth daily at 6 PM.   Yes Historical Provider, MD  sitaGLIPtin-metformin (JANUMET) 50-1000 MG per tablet Take 1 tablet by mouth 2 (two) times daily with a meal. 11/01/14  Yes Deepak Advani,  MD  traMADol (ULTRAM) 50 MG tablet Take 2 tablets (100 mg total) by mouth every 8 (eight) hours as needed for moderate pain. 10/24/14  Yes Doris Cheadle, MD  Vitamin D, Ergocalciferol, (DRISDOL) 50000 UNITS CAPS capsule Take 1 capsule (50,000 Units total) by mouth every 7 (seven) days. 11/01/14  Yes Deepak Advani, MD   BP 176/108 mmHg  Pulse 102  Temp(Src) 99 F (37.2 C) (Oral)  Resp 17  SpO2 92%  LMP 11/20/2014 Physical Exam  Constitutional: She is oriented to person, place,  and time. She appears well-developed and well-nourished.  obese  HENT:  Head: Normocephalic and atraumatic.  Right Ear: External ear normal.  Left Ear: External ear normal.  Nose: Nose normal.  Eyes: Right eye exhibits no discharge. Left eye exhibits no discharge.  Cardiovascular: Normal rate, regular rhythm and normal heart sounds.   Pulmonary/Chest: Effort normal and breath sounds normal.  Abdominal: Soft. There is tenderness in the right upper quadrant, epigastric area and left upper quadrant. There is CVA tenderness (right sided).  No RLQ or lower abdominal tenderness  Neurological: She is alert and oriented to person, place, and time.  Skin: Skin is warm and dry. She is not diaphoretic.  Nursing note and vitals reviewed.   ED Course  Procedures (including critical care time) Labs Review Labs Reviewed  COMPREHENSIVE METABOLIC PANEL - Abnormal; Notable for the following:    Potassium 3.4 (*)    Glucose, Bld 127 (*)    Albumin 3.1 (*)    All other components within normal limits  LIPASE, BLOOD - Abnormal; Notable for the following:    Lipase 200 (*)    All other components within normal limits  URINALYSIS, ROUTINE W REFLEX MICROSCOPIC - Abnormal; Notable for the following:    APPearance HAZY (*)    Hgb urine dipstick SMALL (*)    Protein, ur 30 (*)    Leukocytes, UA TRACE (*)    All other components within normal limits  URINE MICROSCOPIC-ADD ON - Abnormal; Notable for the following:    Squamous Epithelial / LPF MANY (*)    Bacteria, UA FEW (*)    All other components within normal limits  CBC WITH DIFFERENTIAL  I-STAT TROPOININ, ED    Imaging Review US Abdomen Complete  12/11/2014   CLINICAL DATA:  Upper abdominal pain  EXAM: ULTRASOUND ABDOMEN COMPLETE  COMPARISON:  None.  FINDINGS: Gallbladder: No gallstones or wall thickening visualized. No sonographic Murphy sign noted.  Common bile duct: Diameter: 4.5 mm  Liver: No focal lesion identified. Increased hepatic  parenchymal echogenicity.  IVC: No abnormality visualized.  Pancreas: Visualized portion unremarkable.  Spleen: Size and appearance within normal limits.  Right Kidney: Length: 11.9 cm. 1.4 x 1.3 x 1 cm anechoic right interpolar renal mass most consistent with a small cyst. Echogenicity within normal limits. No hydronephrosis visualized.  Left Kidney: Length: 12.8 cm. Echogenicity within normal limits. No mass or hydronephrosis visualized.  Abdominal aorta: No aneurysm visualized. Limited visualization secondary to overlying bowel gas.  Other findings: None.  IMPRESSION: 1. No cholelithiasis or sonographic evidence of acute cholecystitis. 2. Mild increased hepatic echogenicity as can be seen with hepatic steatosis.   Electronically Signed   By: Elige Ko   On: 12/11/2014 16:07     EKG Interpretation None      MDM   Final diagnoses:  Upper abdominal pain  Acute pancreatitis, unspecified pancreatitis type  Hypokalemia    Patient with upper abdominal pain, likely mild pancreatitis with  elevated lipase of 200. Right upper quadrant ultrasound is unremarkable. LFTs are unremarkable as well. She has no right lower quadrant or lower abdominal pain. I discussed possible CT, but at this time we have agreed to treat pain at home, clear liquid diet, and return if symptoms person or pain moved to the lower abdomen. I feel this is most likely an upper abdomen etiology and given her well. She is stable for outpatient treatment and follow-up.    Audree Camel, MD 12/12/14 [phone]

## 2014-12-11 NOTE — Discharge Instructions (Signed)
Abdominal Pain Many things can cause abdominal pain. Usually, abdominal pain is not caused by a disease and will improve without treatment. It can often be observed and treated at home. Your health care provider will do a physical exam and possibly order blood tests and X-rays to help determine the seriousness of your pain. However, in many cases, more time must pass before a clear cause of the pain can be found. Before that point, your health care provider may not know if you need more testing or further treatment. HOME CARE INSTRUCTIONS  Monitor your abdominal pain for any changes. The following actions may help to alleviate any discomfort you are experiencing:  Only take over-the-counter or prescription medicines as directed by your health care provider.  Do not take laxatives unless directed to do so by your health care provider.  Try a clear liquid diet (broth, tea, or water) as directed by your health care provider. Slowly move to a bland diet as tolerated. SEEK MEDICAL CARE IF:  You have unexplained abdominal pain.  You have abdominal pain associated with nausea or diarrhea.  You have pain when you urinate or have a bowel movement.  You experience abdominal pain that wakes you in the night.  You have abdominal pain that is worsened or improved by eating food.  You have abdominal pain that is worsened with eating fatty foods.  You have a fever. SEEK IMMEDIATE MEDICAL CARE IF:   Your pain does not go away within 2 hours.  You keep throwing up (vomiting).  Your pain is felt only in portions of the abdomen, such as the right side or the left lower portion of the abdomen.  You pass bloody or black tarry stools. MAKE SURE YOU:  Understand these instructions.   Will watch your condition.   Will get help right away if you are not doing well or get worse.  Document Released: 09/16/2005 Document Revised: 12/12/2013 Document Reviewed: 08/16/2013 [facility] Patient Information  2015 [place], [place]. This information is not intended to replace advice given to you by your health care provider. Make sure you discuss any questions you have with your health care provider.     Acute Pancreatitis Acute pancreatitis is a disease in which the pancreas becomes suddenly inflamed. The pancreas is a large gland located behind your stomach. The pancreas produces enzymes that help digest food. The pancreas also releases the hormones glucagon and insulin that help regulate blood sugar. Damage to the pancreas occurs when the digestive enzymes from the pancreas are activated and begin attacking the pancreas before being released into the intestine. Most acute attacks last a couple of days and can cause serious complications. Some people become dehydrated and develop low blood pressure. In severe cases, bleeding into the pancreas can lead to shock and can be life-threatening. The lungs, heart, and kidneys may fail. CAUSES  Pancreatitis can happen to anyone. In some cases, the cause is unknown. Most cases are caused by:  Alcohol abuse.  Gallstones. Other less common causes are:  Certain medicines.  Exposure to certain chemicals.  Infection.  Damage caused by an accident (trauma).  Abdominal surgery. SYMPTOMS   Pain in the upper abdomen that may radiate to the back.  Tenderness and swelling of the abdomen.  Nausea and vomiting. DIAGNOSIS  Your caregiver will perform a physical exam. Blood and stool tests may be done to confirm the diagnosis. Imaging tests may also be done, such as X-rays, CT scans, or an ultrasound of the abdomen.  TREATMENT  Treatment usually requires a stay in the hospital. Treatment may include:  Pain medicine.  Fluid replacement through an intravenous line (IV).  Placing a tube in the stomach to remove stomach contents and control vomiting.  Not eating for 3 or 4 days. This gives your pancreas a rest, because enzymes are not being produced that can  cause further damage.  Antibiotic medicines if your condition is caused by an infection.  Surgery of the pancreas or gallbladder. HOME CARE INSTRUCTIONS   Follow the diet advised by your caregiver. This may involve avoiding alcohol and decreasing the amount of fat in your diet.  Eat smaller, more frequent meals. This reduces the amount of digestive juices the pancreas produces.  Drink enough fluids to keep your urine clear or pale yellow.  Only take over-the-counter or prescription medicines as directed by your caregiver.  Avoid drinking alcohol if it caused your condition.  Do not smoke.  Get plenty of rest.  Check your blood sugar at home as directed by your caregiver.  Keep all follow-up appointments as directed by your caregiver. SEEK MEDICAL CARE IF:   You do not recover as quickly as expected.  You develop new or worsening symptoms.  You have persistent pain, weakness, or nausea.  You recover and then have another episode of pain. SEEK IMMEDIATE MEDICAL CARE IF:   You are unable to eat or keep fluids down.  Your pain becomes severe.  You have a fever or persistent symptoms for more than 2 to 3 days.  You have a fever and your symptoms suddenly get worse.  Your skin or the white part of your eyes turn yellow (jaundice).  You develop vomiting.  You feel dizzy, or you faint.  Your blood sugar is high (over 300 mg/dL). MAKE SURE YOU:   Understand these instructions.  Will watch your condition.  Will get help right away if you are not doing well or get worse. Document Released: 12/07/2005 Document Revised: 06/07/2012 Document Reviewed: 03/17/2012 [facility] Patient Information 2015 [place], [place]. This information is not intended to replace advice given to you by your health care provider. Make sure you discuss any questions you have with your health care provider.   Clear Liquid Diet A clear liquid diet is a short-term diet that is prescribed to provide  the necessary fluid and basic energy you need when you can have nothing else. The clear liquid diet consists of liquids or solids that will become liquid at room temperature. You should be able to see through the liquid. There are many reasons that you may be restricted to clear liquids, such as:  When you have a sudden-onset (acute) condition that occurs before or after surgery.  To help your body slowly get adjusted to food again after a long period when you were unable to have food.  Replacement of fluids when you have a diarrheal disease.  When you are going to have certain exams, such as a colonoscopy, in which instruments are inserted inside your body to look at parts of your digestive system. WHAT CAN I HAVE? A clear liquid diet does not provide all the nutrients you need. It is important to choose a variety of the following items to get as many nutrients as possible:  Vegetable juices that do not have pulp.  Fruit juices and fruit drinks that do not have pulp.  Coffee (regular or decaffeinated), tea, or soda at the discretion of your health care provider.  Clear bouillon, broth, or  strained broth-based soups.  High-protein and flavored gelatins.  Sugar or honey.  Ices or frozen ice pops that do not contain milk. If you are not sure whether you can have certain items, you should ask your health care provider. You may also ask your health care provider if there are any other clear liquid options. Document Released: 12/07/2005 Document Revised: 12/12/2013 Document Reviewed: 11/03/2013 [facility] Patient Information 2015 [place]. This information is not intended to replace advice given to you by your health care provider. Make sure you discuss any questions you have with your health care provider.

## 2014-12-11 NOTE — ED Notes (Signed)
Per EMS: Pt reports RUQ radiating to right side since yesterday.  Pt has nausea with no vomiting and abdomen is tender to palpation.  Pain increases with coughing and moving.  Pt given 4mg  zofran en route.  Pt has hx emphesema, htn, and diabetes.

## 2015-01-25 ENCOUNTER — Other Ambulatory Visit: Payer: Self-pay

## 2015-01-25 ENCOUNTER — Telehealth: Payer: Self-pay

## 2015-01-25 DIAGNOSIS — I10 Essential (primary) hypertension: Secondary | ICD-10-CM

## 2015-01-25 DIAGNOSIS — E139 Other specified diabetes mellitus without complications: Secondary | ICD-10-CM

## 2015-01-25 MED ORDER — ALBUTEROL SULFATE (5 MG/ML) 0.5% IN NEBU: 2.5000 mg | INHALATION_SOLUTION | RESPIRATORY_TRACT | Status: DC | PRN

## 2015-01-25 MED ORDER — DAPAGLIFLOZIN PROPANEDIOL 5 MG PO TABS: 5.0000 mg | ORAL_TABLET | Freq: Every day | ORAL | Status: DC

## 2015-01-25 MED ORDER — SITAGLIPTIN PHOS-METFORMIN HCL 50-1000 MG PO TABS: 1.0000 | ORAL_TABLET | Freq: Two times a day (BID) | ORAL | Status: DC

## 2015-01-25 MED ORDER — ALBUTEROL SULFATE HFA 108 (90 BASE) MCG/ACT IN AERS: 2.0000 | INHALATION_SPRAY | Freq: Four times a day (QID) | RESPIRATORY_TRACT | Status: DC | PRN

## 2015-01-25 MED ORDER — LOSARTAN POTASSIUM-HCTZ 100-25 MG PO TABS: 1.0000 | ORAL_TABLET | Freq: Every day | ORAL | Status: DC

## 2015-01-25 NOTE — Progress Notes (Unsigned)
Patient came into office requesting a refill on her medications [facility]

## 2015-01-25 NOTE — Telephone Encounter (Signed)
Returned patient phone call Patient not available Left message on voice mail to return our call 

## 2015-02-14 ENCOUNTER — Telehealth: Payer: Self-pay | Admitting: Internal Medicine

## 2015-02-14 NOTE — Telephone Encounter (Signed)
Pt stated requesting refill Rx Tramadol and Oxycodone Pt was informed of narcotic policy. Pt hung the phone

## 2015-02-14 NOTE — Telephone Encounter (Signed)
Patients family member came into facility to request a med refill for Tramadol, please f/u with pt.

## 2015-02-14 NOTE — Telephone Encounter (Signed)
Pt called requesting refill medication on traMADol (ULTRAM) 50 MG tablet and  oxyCODONE-acetaminophen (PERCOCET) 5-325 MG per tablet. Please f/u with pt

## 2015-02-15 ENCOUNTER — Telehealth: Payer: Self-pay

## 2015-02-15 ENCOUNTER — Telehealth: Payer: Self-pay | Admitting: Internal Medicine

## 2015-02-15 NOTE — Telephone Encounter (Signed)
Returned patient call and explained to the patient that Dr Orpah Cobb is not going To refill her tramadol until she follow up  with her primary care provider

## 2015-02-15 NOTE — Telephone Encounter (Signed)
Pt is requesting a refill for traMADol (ULTRAM) 50 MG tablet. Please follow up with pt.  °

## 2015-02-15 NOTE — Telephone Encounter (Signed)
Advise patient to make a follow up appointment

## 2015-02-22 ENCOUNTER — Telehealth: Payer: Self-pay | Admitting: Emergency Medicine

## 2015-02-22 NOTE — Telephone Encounter (Signed)
Patient needs the following meds asap if possible:  Prednisone Farxiga Metformin  She says she is beginning to have diabetic related symptoms. The phone call was dropped, I tried to call back but couldn't get through.

## 2015-02-27 ENCOUNTER — Other Ambulatory Visit: Payer: Self-pay

## 2015-02-27 DIAGNOSIS — E8801 Alpha-1-antitrypsin deficiency: Secondary | ICD-10-CM

## 2015-02-27 DIAGNOSIS — E139 Other specified diabetes mellitus without complications: Secondary | ICD-10-CM

## 2015-02-27 MED ORDER — METFORMIN HCL 500 MG PO TABS: 500.0000 mg | ORAL_TABLET | Freq: Two times a day (BID) | ORAL | Status: DC

## 2015-02-27 MED ORDER — PREDNISONE 20 MG PO TABS: 60.0000 mg | ORAL_TABLET | Freq: Every day | ORAL | Status: DC

## 2015-02-27 MED ORDER — DAPAGLIFLOZIN PROPANEDIOL 5 MG PO TABS: 5.0000 mg | ORAL_TABLET | Freq: Every day | ORAL | Status: DC

## 2015-02-27 NOTE — Progress Notes (Unsigned)
Patient came into office inquiring about her refills Per Dr Orpah Cobb we can refill the prednisone but patient Must follow up with pulmonology

## 2015-04-11 ENCOUNTER — Other Ambulatory Visit: Payer: Self-pay

## 2015-04-11 DIAGNOSIS — E139 Other specified diabetes mellitus without complications: Secondary | ICD-10-CM

## 2015-04-11 MED ORDER — DAPAGLIFLOZIN PROPANEDIOL 5 MG PO TABS: 5.0000 mg | ORAL_TABLET | Freq: Every day | ORAL | Status: DC

## 2015-04-11 MED ORDER — SITAGLIPTIN PHOS-METFORMIN HCL 50-1000 MG PO TABS: 1.0000 | ORAL_TABLET | Freq: Two times a day (BID) | ORAL | Status: DC

## 2015-04-11 MED ORDER — ALBUTEROL SULFATE HFA 108 (90 BASE) MCG/ACT IN AERS: 2.0000 | INHALATION_SPRAY | Freq: Four times a day (QID) | RESPIRATORY_TRACT | Status: DC | PRN

## 2015-04-23 ENCOUNTER — Ambulatory Visit: Payer: Self-pay

## 2015-04-24 ENCOUNTER — Encounter: Payer: Self-pay | Admitting: Family Medicine

## 2015-04-24 ENCOUNTER — Ambulatory Visit: Payer: Self-pay | Attending: Internal Medicine | Admitting: Family Medicine

## 2015-04-24 VITALS — BP 114/84 | HR 105 | Temp 98.0°F | Resp 16 | Wt 243.8 lb

## 2015-04-24 DIAGNOSIS — E139 Other specified diabetes mellitus without complications: Secondary | ICD-10-CM

## 2015-04-24 DIAGNOSIS — E118 Type 2 diabetes mellitus with unspecified complications: Secondary | ICD-10-CM

## 2015-04-24 DIAGNOSIS — E8801 Alpha-1-antitrypsin deficiency: Secondary | ICD-10-CM | POA: Insufficient documentation

## 2015-04-24 DIAGNOSIS — E119 Type 2 diabetes mellitus without complications: Secondary | ICD-10-CM | POA: Insufficient documentation

## 2015-04-24 DIAGNOSIS — J45909 Unspecified asthma, uncomplicated: Secondary | ICD-10-CM | POA: Insufficient documentation

## 2015-04-24 DIAGNOSIS — Z87891 Personal history of nicotine dependence: Secondary | ICD-10-CM | POA: Insufficient documentation

## 2015-04-24 DIAGNOSIS — I10 Essential (primary) hypertension: Secondary | ICD-10-CM | POA: Insufficient documentation

## 2015-04-24 DIAGNOSIS — Z7951 Long term (current) use of inhaled steroids: Secondary | ICD-10-CM | POA: Insufficient documentation

## 2015-04-24 DIAGNOSIS — Z7952 Long term (current) use of systemic steroids: Secondary | ICD-10-CM | POA: Insufficient documentation

## 2015-04-24 DIAGNOSIS — M25569 Pain in unspecified knee: Secondary | ICD-10-CM | POA: Insufficient documentation

## 2015-04-24 LAB — GLUCOSE, POCT (MANUAL RESULT ENTRY): POC Glucose: 214 mg/dl — AB (ref 70–99)

## 2015-04-24 LAB — POCT GLYCOSYLATED HEMOGLOBIN (HGB A1C): HEMOGLOBIN A1C: 8.8

## 2015-04-24 MED ORDER — TRAMADOL HCL 50 MG PO TABS: 50.0000 mg | ORAL_TABLET | Freq: Three times a day (TID) | ORAL | Status: DC | PRN

## 2015-04-24 MED ORDER — SIMVASTATIN 40 MG PO TABS: 40.0000 mg | ORAL_TABLET | Freq: Every day | ORAL | Status: DC

## 2015-04-24 MED ORDER — GABAPENTIN 100 MG PO CAPS: 100.0000 mg | ORAL_CAPSULE | Freq: Three times a day (TID) | ORAL | Status: DC

## 2015-04-24 MED ORDER — GUAIFENESIN-CODEINE 100-10 MG/5ML PO SOLN: 5.0000 mL | Freq: Four times a day (QID) | ORAL | Status: DC | PRN

## 2015-04-24 MED ORDER — PREDNISONE 20 MG PO TABS: 60.0000 mg | ORAL_TABLET | Freq: Every day | ORAL | Status: DC

## 2015-04-24 MED ORDER — DAPAGLIFLOZIN PROPANEDIOL 5 MG PO TABS: 5.0000 mg | ORAL_TABLET | Freq: Every day | ORAL | Status: DC

## 2015-04-24 MED ORDER — ALBUTEROL SULFATE HFA 108 (90 BASE) MCG/ACT IN AERS: 2.0000 | INHALATION_SPRAY | Freq: Four times a day (QID) | RESPIRATORY_TRACT | Status: DC | PRN

## 2015-04-24 MED ORDER — BECLOMETHASONE DIPROPIONATE 80 MCG/ACT IN AERS: 2.0000 | INHALATION_SPRAY | Freq: Every day | RESPIRATORY_TRACT | Status: DC

## 2015-04-24 MED ORDER — METHYLPREDNISOLONE SODIUM SUCC 125 MG IJ SOLR: 80.0000 mg | Freq: Once | INTRAMUSCULAR | Status: DC

## 2015-04-24 MED ORDER — ALBUTEROL SULFATE (5 MG/ML) 0.5% IN NEBU: 2.5000 mg | INHALATION_SOLUTION | RESPIRATORY_TRACT | Status: DC | PRN

## 2015-04-24 NOTE — Progress Notes (Deleted)
  04/24/2015, 2:25 PM  HPI Subjective:     Pa  Review of Systems     Objective:   Physical Exam     Assessment:      Plan:

## 2015-04-24 NOTE — Patient Instructions (Addendum)
Avoid white foods (those high in carbohydrates) especially things sweetened with sugar. You need to follow-up with pulmonary doctor as requested by Dr. Venida Jarvis. We will call you with lab results and if changes are needed in your diabetes treatment. Avoid high salt foods for BP control Examine feet daily for cuts or sores. Avoid going barefoot    .Basic Carbohydrate Counting for Diabetes Mellitus Carbohydrate counting is a method for keeping track of the amount of carbohydrates you eat. Eating carbohydrates naturally increases the level of sugar (glucose) in your blood, so it is important for you to know the amount that is okay for you to have in every meal. Carbohydrate counting helps keep the level of glucose in your blood within normal limits. The amount of carbohydrates allowed is different for every person. A dietitian can help you calculate the amount that is right for you. Once you know the amount of carbohydrates you can have, you can count the carbohydrates in the foods you want to eat. Carbohydrates are found in the following foods:  Grains, such as breads and cereals.  Dried beans and soy products.  Starchy vegetables, such as potatoes, peas, and corn.  Fruit and fruit juices.  Milk and yogurt.  Sweets and snack foods, such as cake, cookies, candy, chips, soft drinks, and fruit drinks. CARBOHYDRATE COUNTING There are two ways to count the carbohydrates in your food. You can use either of the methods or a combination of both. Reading the "Nutrition Facts" on Packaged Food The "Nutrition Facts" is an area that is included on the labels of almost all packaged food and beverages in the [place]. It includes the serving size of that food or beverage and information about the nutrients in each serving of the food, including the grams (g) of carbohydrate per serving.  Decide the number of servings of this food or beverage that you will be able to eat or drink. Multiply that number of  servings by the number of grams of carbohydrate that is listed on the label for that serving. The total will be the amount of carbohydrates you will be having when you eat or drink this food or beverage. Learning Standard Serving Sizes of Food When you eat food that is not packaged or does not include "Nutrition Facts" on the label, you need to measure the servings in order to count the amount of carbohydrates.A serving of most carbohydrate-rich foods contains about 15 g of carbohydrates. The following list includes serving sizes of carbohydrate-rich foods that provide 15 g ofcarbohydrate per serving:   1 slice of bread (1 oz) or 1 six-inch tortilla.    of a hamburger bun or English muffin.  4-6 crackers.   cup unsweetened dry cereal.    cup hot cereal.   cup rice or pasta.    cup mashed potatoes or  of a large baked potato.  1 cup fresh fruit or one small piece of fruit.    cup canned or frozen fruit or fruit juice.  1 cup milk.   cup plain fat-free yogurt or yogurt sweetened with artificial sweeteners.   cup cooked dried beans or starchy vegetable, such as peas, corn, or potatoes.  Decide the number of standard-size servings that you will eat. Multiply that number of servings by 15 (the grams of carbohydrates in that serving). For example, if you eat 2 cups of strawberries, you will have eaten 2 servings and 30 g of carbohydrates (2 servings x 15 g = 30 g). For  foods such as soups and casseroles, in which more than one food is mixed in, you will need to count the carbohydrates in each food that is included. EXAMPLE OF CARBOHYDRATE COUNTING Sample Dinner  3 oz chicken breast.   cup of brown rice.   cup of corn.  1 cup milk.   1 cup strawberries with sugar-free whipped topping.  Carbohydrate Calculation Step 1: Identify the foods that contain carbohydrates:   Rice.   Corn.   Milk.   Strawberries. Step 2:Calculate the number of servings eaten  of each:   2 servings of rice.   1 serving of corn.   1 serving of milk.   1 serving of strawberries. Step 3: Multiply each of those number of servings by 15 g:   2 servings of rice x 15 g = 30 g.   1 serving of corn x 15 g = 15 g.   1 serving of milk x 15 g = 15 g.   1 serving of strawberries x 15 g = 15 g. Step 4: Add together all of the amounts to find the total grams of carbohydrates eaten: 30 g + 15 g + 15 g + 15 g = 75 g. Document Released: 12/07/2005 Document Revised: 04/23/2014 Document Reviewed: 11/03/2013 [facility] Patient Information 2015 [place], [place]. This information is not intended to replace advice given to you by your health care provider. Make sure you discuss any questions you have with your health care provider.

## 2015-04-24 NOTE — Progress Notes (Signed)
Patient here for follow up on her diabetes and asthma Complains of her asthma flaring up from the change in weather And has been out of her prednisone the last three days Patient also complains of her feet being numb and tingly

## 2015-04-24 NOTE — Progress Notes (Addendum)
Patient ID: Carla Hicks, female   DOB: 12/27/1970, 44 y.o.   MRN: [id number] tient ID: Carla Hicks, female   DOB: Nov 30, 1971, 44 y.o.   MRN: [id number]     Carla Hicks, is a 44 y.o. female  [id number]    DOB - [birth year]  CC:  Chief Complaint  Patient presents with  . Diabetes  . Asthma        HPI  Here for follow-up of chronic conditions and needing refills on medications. She has not had an A1C in about 8 months. At that time it was 8.8. She has had some changes in Diabetes medications  since that time. The last time on 4/21. She should be follow-up with her PCP but he is not here this week and she is out of medications. 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 She has not had her prednisore 60 mg for the last 3 days as she ran out. She request an injection of Solumedrol, which she has had before. She also asks for something for her cough.Her only complaint today is numbness and tingling of her hands and feet. She has asthma and request refills on all medications for asthma. She has chronic knee pain and request a refill of her Tramadol. I see in her chart that Dr. Venida Jarvis prescribed Janumet last month but she says she is unaware of this and is still taking metformin and [place].      Allergies  Allergen Reactions  . Penicillins Hives    Past Medical History  Diagnosis Date  . Diabetes mellitus   . Hypertension   . Urinary bladder disorder   . Asthma   . Emphysema    [name]'s family history is not on file.  History   Social History  . Marital Status: Legally Separated    Spouse Name: N/A  . Number of Children: N/A  . Years of Education: N/A   Occupational History  . Not on file.   Social History Main Topics  . Smoking status: Former Games developer  . Smokeless tobacco: Not on file  . Alcohol Use: No  . Drug Use: No  . Sexual Activity: Yes    Birth Control/ Protection: None   Other Topics  Concern  . Not on file   Social History Narrative   Past Surgical History  Procedure Laterality Date  . Cesarean section         ROS:  GEN:   Denies fever, chills,WT loss, loss of appetite HENT:   Denies, ear ache, eye pain or drainage, ST, neck swelling,  Skin:   Denies lesions or rashes             LUNGS:  admits to cough and some wheezing and some shortness of breath. CV:   Denies CP or palpitations ABD:   Denies abdominal pain, nausea,and vomiting, diarrhea   EXT:  Denies swelling of lower legs but admits to numbness and tingling.          Objective:  Filed Vitals:   04/24/15 1423  BP: 114/84  Pulse: 105  Temp: 98 F (36.7 C)  Resp: 16  Weight: 243 lb 12.8 oz (110.587 kg)  SpO2: 96%    Physical Exam:  General:  Alert, oriented, apprpriate in no acute distress Skin:   Warm and dry,  HEENT:  Normocephalic, atraumatic, no pallor, no icterus, moist oral mucosa,  Neck:   Supple FROM w/o adenopathy, tenderness, or  thyroidomegaly. Heart:   Normal  RRR,without M,G,R Lungs:   Clear to auscultation bilaterally. No increase in respiratory effort. Abdomen:  Soft, nontender, nondistended, positive bowel sounds. Extremeties: No significant pedal edema, pedal pulse 2+, Monofilament is positive for decrease sensation in both feet.    Pertinent Lab Results:  Lab Results  Component Value Date   WBC 9.4 12/11/2014   HGB 14.0 12/11/2014   HCT 41.2 12/11/2014   MCV 89.6 12/11/2014   PLT 213 12/11/2014     Chemistry      Component Value Date/Time   NA 138 12/11/2014 1538   K 3.4* 12/11/2014 1538   CL 101 12/11/2014 1538   CO2 26 12/11/2014 1538   BUN 8 12/11/2014 1538   CREATININE 0.76 12/11/2014 1538   CREATININE 0.91 10/24/2014 0949      Component Value Date/Time   CALCIUM 9.0 12/11/2014 1538   ALKPHOS 56 12/11/2014 1538   AST 20 12/11/2014 1538   ALT 17 12/11/2014 1538   BILITOT 0.7 12/11/2014 1538      Lab Results  Component Value Date   HGBA1C 8.8*  08/30/2014    Medications: Prior to Admission medications   Medication Sig Start Date End Date Taking? Authorizing Provider  albuterol (PROVENTIL HFA;VENTOLIN HFA) 108 (90 BASE) MCG/ACT inhaler Inhale 2 puffs into the lungs every 6 (six) hours as needed for wheezing or shortness of breath. Sob 04/11/15   Doris Cheadle, MD  albuterol (PROVENTIL) (5 MG/ML) 0.5% nebulizer solution Take 0.5 mLs (2.5 mg total) by nebulization every 4 (four) hours as needed for wheezing or shortness of breath. Sob 01/25/15   Doris Cheadle, MD  beclomethasone (QVAR) 80 MCG/ACT inhaler Inhale 2 puffs into the lungs daily.    Historical Provider, MD  dapagliflozin propanediol (FARXIGA) 5 MG TABS tablet Take 5 mg by mouth daily. 04/11/15   Doris Cheadle, MD  losartan-hydrochlorothiazide (HYZAAR) 100-25 MG per tablet Take 1 tablet by mouth daily. 01/25/15   Doris Cheadle, MD  metFORMIN (GLUCOPHAGE) 500 MG tablet Take 1 tablet (500 mg total) by mouth 2 (two) times daily with a meal. 02/27/15   Doris Cheadle, MD  Multiple Vitamin (MULTIVITAMIN WITH MINERALS) TABS tablet Take 1 tablet by mouth daily.    Historical Provider, MD  oxyCODONE-acetaminophen (PERCOCET) 5-325 MG per tablet Take 1 tablet by mouth every 4 (four) hours as needed. 12/11/14   Pricilla Loveless, MD  predniSONE (DELTASONE) 20 MG tablet Take 3 tablets (60 mg total) by mouth daily. 02/27/15   Doris Cheadle, MD  simvastatin (ZOCOR) 40 MG tablet Take 40 mg by mouth daily at 6 PM.    Historical Provider, MD  sitaGLIPtin-metformin (JANUMET) 50-1000 MG per tablet Take 1 tablet by mouth 2 (two) times daily with a meal. 04/11/15   Doris Cheadle, MD  traMADol (ULTRAM) 50 MG tablet Take 2 tablets (100 mg total) by mouth every 8 (eight) hours as needed for moderate pain. 10/24/14   Doris Cheadle, MD  Vitamin D, Ergocalciferol, (DRISDOL) 50000 UNITS CAPS capsule Take 1 capsule (50,000 Units total) by mouth every 7 (seven) days. 11/01/14   Doris Cheadle, MD    Assessment: 1.   Diabetes 2. Alpha I Antitrysin Deficeit 3. Asthma 4. Hypertension 5.   Plan: 1. Refilled metformin and farxiga. Discussed appropriate diet and exercise and foot care. 2. Injection of Solumedrol 80 mg IM, refill of prednisone 3. Refill of Albuterol solution and inhaler, as well as Qvar. 4. Advised low salt diet and increase in exercise.  Follow up:  The patient was given clear instructions to go to ER or return to medical center if symptoms don't improve, worsen or new problems develop. The patient verbalized understanding.  Will advise on apprpriate follow-up with Dr. Venida Jarvis when we see bloodwork results.    Henrietta Hoover, FNP,BC   Addendum:  Will add glipizide  to her metformin and farxiga.

## 2015-04-25 LAB — CBC WITH DIFFERENTIAL/PLATELET
BASOS ABS: 0 10*3/uL (ref 0.0–0.1)
Basophils Relative: 0 % (ref 0–1)
Eosinophils Absolute: 0 10*3/uL (ref 0.0–0.7)
Eosinophils Relative: 0 % (ref 0–5)
HCT: 42.7 % (ref 36.0–46.0)
HEMOGLOBIN: 14.6 g/dL (ref 12.0–15.0)
LYMPHS ABS: 3.1 10*3/uL (ref 0.7–4.0)
LYMPHS PCT: 32 % (ref 12–46)
MCH: 31.3 pg (ref 26.0–34.0)
MCHC: 34.2 g/dL (ref 30.0–36.0)
MCV: 91.6 fL (ref 78.0–100.0)
MPV: 10.9 fL (ref 8.6–12.4)
Monocytes Absolute: 0.6 10*3/uL (ref 0.1–1.0)
Monocytes Relative: 6 % (ref 3–12)
Neutro Abs: 6 10*3/uL (ref 1.7–7.7)
Neutrophils Relative %: 62 % (ref 43–77)
Platelets: 292 10*3/uL (ref 150–400)
RBC: 4.66 MIL/uL (ref 3.87–5.11)
RDW: 14 % (ref 11.5–15.5)
WBC: 9.6 10*3/uL (ref 4.0–10.5)

## 2015-04-25 LAB — COMPLETE METABOLIC PANEL WITH GFR
ALBUMIN: 3.4 g/dL — AB (ref 3.5–5.2)
ALT: 13 U/L (ref 0–35)
AST: 13 U/L (ref 0–37)
Alkaline Phosphatase: 53 U/L (ref 39–117)
BUN: 8 mg/dL (ref 6–23)
CALCIUM: 9.1 mg/dL (ref 8.4–10.5)
CHLORIDE: 98 meq/L (ref 96–112)
CO2: 22 mEq/L (ref 19–32)
Creat: 0.63 mg/dL (ref 0.50–1.10)
GFR, Est African American: >89 mL/min
GFR, Est Non African American: >89 mL/min
Glucose, Bld: 164 mg/dL — ABNORMAL HIGH (ref 70–99)
POTASSIUM: 3.3 meq/L — AB (ref 3.5–5.3)
SODIUM: 136 meq/L (ref 135–145)
TOTAL PROTEIN: 6.3 g/dL (ref 6.0–8.3)
Total Bilirubin: 0.5 mg/dL (ref 0.2–1.2)

## 2015-04-25 LAB — HEMOGLOBIN A1C
Hgb A1c MFr Bld: 9 % — ABNORMAL HIGH (ref <5.7)
Mean Plasma Glucose: 212 mg/dL — ABNORMAL HIGH (ref <117)

## 2015-04-25 MED ORDER — GLIPIZIDE 5 MG PO TABS: 5.0000 mg | ORAL_TABLET | Freq: Two times a day (BID) | ORAL | Status: DC

## 2015-04-25 NOTE — Addendum Note (Signed)
Addended by: Henrietta Hoover on: 04/25/2015 01:52 PM   Modules accepted: Orders

## 2015-04-29 ENCOUNTER — Emergency Department (INDEPENDENT_AMBULATORY_CARE_PROVIDER_SITE_OTHER)
Admission: EM | Admit: 2015-04-29 | Discharge: 2015-04-29 | Disposition: A | Discharge status: Home / Self Care | Payer: Self-pay | Source: Home / Self Care | Attending: Family Medicine | Admitting: Family Medicine

## 2015-04-29 ENCOUNTER — Other Ambulatory Visit: Payer: Self-pay | Admitting: Family Medicine

## 2015-04-29 ENCOUNTER — Encounter (HOSPITAL_COMMUNITY): Payer: Self-pay | Admitting: Emergency Medicine

## 2015-04-29 DIAGNOSIS — S60414A Abrasion of right ring finger, initial encounter: Secondary | ICD-10-CM

## 2015-04-29 DIAGNOSIS — Z23 Encounter for immunization: Secondary | ICD-10-CM

## 2015-04-29 MED ORDER — TETANUS-DIPHTH-ACELL PERTUSSIS 5-2.5-18.5 LF-MCG/0.5 IM SUSP
INTRAMUSCULAR | Status: AC
Start: 2015-04-29 — End: 2015-04-29
  Filled 2015-04-29: qty 0.5

## 2015-04-29 MED ORDER — TETANUS-DIPHTH-ACELL PERTUSSIS 5-2.5-18.5 LF-MCG/0.5 IM SUSP
0.5000 mL | Freq: Once | INTRAMUSCULAR | Status: AC
  Administered 2015-04-29: 0.5 mL via INTRAMUSCULAR

## 2015-04-29 NOTE — Discharge Instructions (Signed)

## 2015-04-29 NOTE — ED Provider Notes (Signed)
CSN: [id number]     Arrival date & time 04/29/15  1701 History   First MD Initiated Contact with Patient 04/29/15 1802     No chief complaint on file.  (Consider location/radiation/quality/duration/timing/severity/associated sxs/prior Treatment) HPI Comments: 44 year old female with type 2 diabetes mellitus was grasping a package to lift at [employer] 2 days ago and suffered a "laceration" to the right distal ring finger. She called her PCP for advice and was told to go to the urgent care to be assessed for infection. She cannot remember when her last T gap was.   Past Medical History  Diagnosis Date  . Diabetes mellitus   . Hypertension   . Urinary bladder disorder   . Asthma   . Emphysema    Past Surgical History  Procedure Laterality Date  . Cesarean section     History reviewed. No pertinent family history. History  Substance Use Topics  . Smoking status: Former Games developer  . Smokeless tobacco: Not on file  . Alcohol Use: No   OB History    Gravida Para Term Preterm AB TAB SAB Ectopic Multiple Living   5 4 4       4      Review of Systems  Constitutional: Negative.   Respiratory: Negative for cough and shortness of breath.   Cardiovascular: Negative for chest pain.  Gastrointestinal: Negative.   Musculoskeletal: Negative for arthralgias.  Skin: Positive for wound.  Neurological: Negative.   All other systems reviewed and are negative.   Allergies  Penicillins  Home Medications   Prior to Admission medications   Medication Sig Start Date End Date Taking? Authorizing Provider  albuterol (PROVENTIL HFA;VENTOLIN HFA) 108 (90 BASE) MCG/ACT inhaler Inhale 2 puffs into the lungs every 6 (six) hours as needed for wheezing or shortness of breath. Sob 04/24/15   Henrietta Hoover, NP  albuterol (PROVENTIL) (5 MG/ML) 0.5% nebulizer solution Take 0.5 mLs (2.5 mg total) by nebulization every 4 (four) hours as needed for wheezing or shortness of breath. Sob 04/24/15   Henrietta Hoover,  NP  beclomethasone (QVAR) 80 MCG/ACT inhaler Inhale 2 puffs into the lungs daily. 04/24/15   Henrietta Hoover, NP  dapagliflozin propanediol (FARXIGA) 5 MG TABS tablet Take 5 mg by mouth daily. 04/24/15   Henrietta Hoover, NP  gabapentin (NEURONTIN) 100 MG capsule Take 1 capsule (100 mg total) by mouth 3 (three) times daily. 04/24/15   Henrietta Hoover, NP  glipiZIDE (GLUCOTROL) 5 MG tablet Take 1 tablet (5 mg total) by mouth 2 (two) times daily before a meal. 04/25/15   Henrietta Hoover, NP  guaiFENesin-codeine 100-10 MG/5ML syrup Take 5 mLs by mouth every 6 (six) hours as needed for cough. 04/24/15   Henrietta Hoover, NP  losartan-hydrochlorothiazide (HYZAAR) 100-25 MG per tablet Take 1 tablet by mouth daily. 01/25/15   Doris Cheadle, MD  metFORMIN (GLUCOPHAGE) 500 MG tablet Take 1 tablet (500 mg total) by mouth 2 (two) times daily with a meal. 02/27/15   Doris Cheadle, MD  methylPREDNISolone sodium succinate (SOLU-MEDROL) 125 mg/2 mL injection Inject 1.28 mLs (80 mg total) into the muscle once. 04/24/15   Henrietta Hoover, NP  Multiple Vitamin (MULTIVITAMIN WITH MINERALS) TABS tablet Take 1 tablet by mouth daily.    Historical Provider, MD  oxyCODONE-acetaminophen (PERCOCET) 5-325 MG per tablet Take 1 tablet by mouth every 4 (four) hours as needed. 12/11/14   Pricilla Loveless, MD  predniSONE (DELTASONE) 20 MG tablet Take 3 tablets (60 mg  total) by mouth daily. 04/24/15   Henrietta Hoover, NP  simvastatin (ZOCOR) 40 MG tablet Take 1 tablet (40 mg total) by mouth daily at 6 PM. 04/24/15   Henrietta Hoover, NP  sitaGLIPtin-metformin (JANUMET) 50-1000 MG per tablet Take 1 tablet by mouth 2 (two) times daily with a meal. 04/11/15   Doris Cheadle, MD  traMADol (ULTRAM) 50 MG tablet Take 1 tablet (50 mg total) by mouth every 8 (eight) hours as needed for moderate pain. 04/24/15   Henrietta Hoover, NP  Vitamin D, Ergocalciferol, (DRISDOL) 50000 UNITS CAPS capsule Take 1 capsule (50,000 Units total) by mouth every 7  (seven) days. 11/01/14   Doris Cheadle, MD   BP 115/85 mmHg  Pulse 108  Temp(Src) 98.1 F (36.7 C) (Oral)  Resp 16  SpO2 96%  LMP 04/21/2015 Physical Exam  Constitutional: She is oriented to person, place, and time. She appears well-developed and well-nourished. No distress.  Pulmonary/Chest: Effort normal. No respiratory distress.  Musculoskeletal: Normal range of motion. She exhibits no edema or tenderness.  Neurological: She is alert and oriented to person, place, and time. She exhibits normal muscle tone.  Skin: Skin is warm.  Right ring finger with a 2 mm superficial abrasion at the extensor surface of the DIP joint. This is best seen with magnification. There is full flexion and extension against resistance. There is no swelling, erythema or reduction in function. No signs of infection.  Psychiatric: She has a normal mood and affect.  Nursing note and vitals reviewed.   ED Course  Procedures (including critical care time) Labs Review Labs Reviewed - No data to display  Imaging Review No results found.   MDM   1. Abrasion of fourth finger, right, initial encounter    The signs of infection. Wound is healing well. Plus small amount of Betadine and family are of nystatin with a Band-Aid now. Did not put any further ointment on it. The clean with soap and water. For signs of infection may return.    Hayden Rasmussen, NP 04/29/15 1843

## 2015-04-29 NOTE — ED Notes (Signed)
Pt states that she cut her finger on a rusted nail at [employer] picking up a case of water. Pt states that she would like a tetanus injection because it has been over 10 years since her last injection.

## 2015-05-09 ENCOUNTER — Other Ambulatory Visit: Payer: Self-pay

## 2015-05-09 MED ORDER — POTASSIUM CHLORIDE CRYS ER 20 MEQ PO TBCR: 20.0000 meq | EXTENDED_RELEASE_TABLET | Freq: Every day | ORAL | Status: DC

## 2015-05-10 ENCOUNTER — Other Ambulatory Visit: Payer: Self-pay | Admitting: *Deleted

## 2015-05-24 ENCOUNTER — Telehealth: Payer: Self-pay | Admitting: Internal Medicine

## 2015-05-24 NOTE — Telephone Encounter (Signed)
Patient called to request a med refill for: ° °guaiFENesin-codeine 100-10 MG/5ML syrup °predniSONE (DELTASONE) 20 MG tablet ° °Please f/u with pt. °

## 2015-05-27 NOTE — Telephone Encounter (Signed)
Patient called to request a med refill for:  guaiFENesin-codeine 100-10 MG/5ML syrup predniSONE (DELTASONE) 20 MG tablet  Please f/u with pt.

## 2015-05-28 ENCOUNTER — Telehealth: Payer: Self-pay

## 2015-05-28 DIAGNOSIS — E8801 Alpha-1-antitrypsin deficiency: Secondary | ICD-10-CM

## 2015-05-28 MED ORDER — PREDNISONE 20 MG PO TABS: 60.0000 mg | ORAL_TABLET | Freq: Every day | ORAL | Status: DC

## 2015-05-28 MED ORDER — GUAIFENESIN 100 MG/5ML PO LIQD: 200.0000 mg | Freq: Three times a day (TID) | ORAL | Status: DC | PRN

## 2015-05-28 NOTE — Telephone Encounter (Signed)
Patient called requesting a refill on her cough medicine and prednisone Per Dr Orpah Cobb we can fill prednisone one more time and than patient needs  To follow up with pulmonologist

## 2015-05-30 ENCOUNTER — Emergency Department (INDEPENDENT_AMBULATORY_CARE_PROVIDER_SITE_OTHER)
Admission: EM | Admit: 2015-05-30 | Discharge: 2015-05-30 | Disposition: A | Discharge status: Home / Self Care | Payer: Self-pay | Source: Home / Self Care | Attending: Family Medicine | Admitting: Family Medicine

## 2015-05-30 ENCOUNTER — Other Ambulatory Visit: Payer: Self-pay | Admitting: Internal Medicine

## 2015-05-30 ENCOUNTER — Encounter (HOSPITAL_COMMUNITY): Payer: Self-pay | Admitting: *Deleted

## 2015-05-30 DIAGNOSIS — R053 Chronic cough: Secondary | ICD-10-CM

## 2015-05-30 DIAGNOSIS — S40011A Contusion of right shoulder, initial encounter: Secondary | ICD-10-CM

## 2015-05-30 DIAGNOSIS — R05 Cough: Secondary | ICD-10-CM

## 2015-05-30 DIAGNOSIS — W19XXXA Unspecified fall, initial encounter: Secondary | ICD-10-CM

## 2015-05-30 DIAGNOSIS — E8801 Alpha-1-antitrypsin deficiency: Secondary | ICD-10-CM

## 2015-05-30 DIAGNOSIS — R21 Rash and other nonspecific skin eruption: Secondary | ICD-10-CM

## 2015-05-30 MED ORDER — TRIAMCINOLONE ACETONIDE 0.1 % EX CREA: 1.0000 "application " | TOPICAL_CREAM | Freq: Two times a day (BID) | CUTANEOUS | Status: DC

## 2015-05-30 MED ORDER — DICLOFENAC SODIUM 1 % TD GEL: 1.0000 "application " | Freq: Four times a day (QID) | TRANSDERMAL | Status: DC

## 2015-05-30 MED ORDER — IPRATROPIUM BROMIDE 0.06 % NA SOLN: 2.0000 | Freq: Four times a day (QID) | NASAL | Status: DC

## 2015-05-30 NOTE — ED Provider Notes (Signed)
CSN: [id number]     Arrival date & time 05/30/15  1300 History   First MD Initiated Contact with Patient 05/30/15 1316     Chief Complaint  Patient presents with  . URI   (Consider location/radiation/quality/duration/timing/severity/associated sxs/prior Treatment) HPI Comments: 44 year old morbidly obese female with a history of alpha-1 trypsin deficiency with COPD symptoms presents with a chronic cough. She has seen her PCP and/or has spoken via telephone phone at least twice in the last couple days requesting guaifenesin with codeine. She states after taking guaifenesin for which she has had several times before she developed a rash across the fore head and upper face. She states it is mildly itchy. She also states that the color type of this particular guaifenesin was a different color and she believes that there was something in it that was different the caused her to have his reaction.  She also has some allergic rhinitis symptoms such as PND and runny nose. She is not taking medicines for this.  Approximately a week ago she fell on her right shoulder and she is complaining of pain to the right deltoid. It is sore and keeps her from abducting her right arm greater than 90.   Past Medical History  Diagnosis Date  . Diabetes mellitus   . Hypertension   . Urinary bladder disorder   . Asthma   . Emphysema    Past Surgical History  Procedure Laterality Date  . Cesarean section     History reviewed. No pertinent family history. History  Substance Use Topics  . Smoking status: Former Games developer  . Smokeless tobacco: Not on file  . Alcohol Use: No   OB History    Gravida Para Term Preterm AB TAB SAB Ectopic Multiple Living   Review of Systems  Constitutional: Positive for activity change. Negative for fever and fatigue.  HENT: Positive for postnasal drip and rhinorrhea.   Eyes: Negative for visual disturbance.  Respiratory: Positive for cough, shortness of breath  and wheezing.   Cardiovascular: Negative for chest pain and leg swelling.  Gastrointestinal: Negative.   Musculoskeletal: Positive for myalgias.  Skin: Positive for rash.  Neurological: Negative.     Allergies  Penicillins  Home Medications   Prior to Admission medications   Medication Sig Start Date End Date Taking? Authorizing Provider  albuterol (PROVENTIL HFA;VENTOLIN HFA) 108 (90 BASE) MCG/ACT inhaler Inhale 2 puffs into the lungs every 6 (six) hours as needed for wheezing or shortness of breath. Sob 04/24/15   Henrietta Hoover, NP  albuterol (PROVENTIL) (5 MG/ML) 0.5% nebulizer solution Take 0.5 mLs (2.5 mg total) by nebulization every 4 (four) hours as needed for wheezing or shortness of breath. Sob 04/24/15   Henrietta Hoover, NP  beclomethasone (QVAR) 80 MCG/ACT inhaler Inhale 2 puffs into the lungs daily. 04/24/15   Henrietta Hoover, NP  dapagliflozin propanediol (FARXIGA) 5 MG TABS tablet Take 5 mg by mouth daily. 04/24/15   Henrietta Hoover, NP  diclofenac sodium (VOLTAREN) 1 % GEL Apply 1 application topically 4 (four) times daily. 05/30/15   Hayden Rasmussen, NP  gabapentin (NEURONTIN) 100 MG capsule Take 1 capsule (100 mg total) by mouth 3 (three) times daily. 04/24/15   Henrietta Hoover, NP  glipiZIDE (GLUCOTROL) 5 MG tablet Take 1 tablet (5 mg total) by mouth 2 (two) times daily before a meal. 04/25/15   Henrietta Hoover, NP  guaiFENesin (  ROBITUSSIN) 100 MG/5ML liquid Take 10 mLs (200 mg total) by mouth 3 (three) times daily as needed for cough. 05/28/15   Doris Cheadle, MD  guaiFENesin-codeine 100-10 MG/5ML syrup Take 5 mLs by mouth every 6 (six) hours as needed for cough. 04/24/15   Henrietta Hoover, NP  ipratropium (ATROVENT) 0.06 % nasal spray Place 2 sprays into both nostrils 4 (four) times daily. 05/30/15   Hayden Rasmussen, NP  losartan-hydrochlorothiazide (HYZAAR) 100-25 MG per tablet Take 1 tablet by mouth daily. 01/25/15   Doris Cheadle, MD  metFORMIN (GLUCOPHAGE) 500 MG tablet Take 1  tablet (500 mg total) by mouth 2 (two) times daily with a meal. 02/27/15   Doris Cheadle, MD  methylPREDNISolone sodium succinate (SOLU-MEDROL) 125 mg/2 mL injection Inject 1.28 mLs (80 mg total) into the muscle once. 04/24/15   Henrietta Hoover, NP  Multiple Vitamin (MULTIVITAMIN WITH MINERALS) TABS tablet Take 1 tablet by mouth daily.    Historical Provider, MD  oxyCODONE-acetaminophen (PERCOCET) 5-325 MG per tablet Take 1 tablet by mouth every 4 (four) hours as needed. 12/11/14   Pricilla Loveless, MD  potassium chloride SA (K-DUR,KLOR-CON) 20 MEQ tablet Take 1 tablet (20 mEq total) by mouth daily. 05/09/15   Henrietta Hoover, NP  predniSONE (DELTASONE) 20 MG tablet Take 3 tablets (60 mg total) by mouth daily. 05/28/15   Doris Cheadle, MD  simvastatin (ZOCOR) 40 MG tablet Take 1 tablet (40 mg total) by mouth daily at 6 PM. 04/24/15   Henrietta Hoover, NP  sitaGLIPtin-metformin (JANUMET) 50-1000 MG per tablet Take 1 tablet by mouth 2 (two) times daily with a meal. 04/11/15   Doris Cheadle, MD  traMADol (ULTRAM) 50 MG tablet Take 1 tablet (50 mg total) by mouth every 8 (eight) hours as needed for moderate pain. 04/24/15   Henrietta Hoover, NP  triamcinolone cream (KENALOG) 0.1 % Apply 1 application topically 2 (two) times daily. 05/30/15   Hayden Rasmussen, NP  Vitamin D, Ergocalciferol, (DRISDOL) 50000 UNITS CAPS capsule Take 1 capsule (50,000 Units total) by mouth every 7 (seven) days. 11/01/14   Doris Cheadle, MD   BP 114/93 mmHg  Pulse 116  Temp(Src) 98.6 F (37 C) (Oral)  Resp 16  SpO2 100%  LMP 05/22/2015 Physical Exam  Constitutional: She is oriented to person, place, and time. She appears well-developed and well-nourished. No distress.  HENT:  Oropharynx with minor erythema and clear PND.  Neck: Normal range of motion. Neck supple.  Cardiovascular: Normal rate, regular rhythm and normal heart sounds.   Pulmonary/Chest: Effort normal. She has no rales.  Lungs with fair air movement with mildly  prolonged expiratory phase. Forced expiration produces distant expiratory wheezing. No data the patient states she leaves she is at baseline for her respiratory status.  Musculoskeletal: She exhibits no edema.  Right shoulder exam reveals no tenderness or pain to the shoulder joint itself no tenderness to the shoulder joint line, clavicle, before meals joint, glenoid. Tenderness is to the deltoid muscle only. Abduction to 90 limited by pain to the deltoid muscle. Internal rotation and neck rotation intact with some pain to the deltoid muscle. Distal neurovascular motor Sentry is intact.  Lymphadenopathy:    She has no cervical adenopathy.  Neurological: She is alert and oriented to person, place, and time.  Skin: Skin is warm and dry.  Psychiatric: She has a normal mood and affect.  Nursing note and vitals reviewed.   ED Course  Procedures (including critical care time) Labs Review  Labs Reviewed - No data to display  Imaging Review No results found.   MDM   1. Fall, initial encounter   2. Contusion of deltoid region, right, initial encounter   3. Alpha-1-antitrypsin deficiency   4. Chronic cough   5. Rash of face    ontinue your albuterol nebs and inhalers Start either Allegra or Zyrtec for drainage to help with cough.  atrovent nasal spray for nasal drainage Triamcinolone cream to face. See your doctor or call this week as needed.    Hayden Rasmussen, NP 05/30/15 1354

## 2015-05-30 NOTE — ED Notes (Addendum)
Pt  Reports  Symptoms  Of  Cough  .  Congested      -     With  Sinus   drianage   With  Onset  Of  Symptoms       For  A   While  Pt      Reports  Symptoms  Not  releived  By  otc  meds      Pt  States  She  Felled   sev  Weeks  Ago  As   Well  And   Has  Some  Bruising

## 2015-05-30 NOTE — Discharge Instructions (Signed)
Contusion Diclofenac gel as directed  A contusion is a deep bruise. Contusions happen when an injury causes bleeding under the skin. Signs of bruising include pain, puffiness (swelling), and discolored skin. The contusion may turn blue, purple, or yellow.  HOME CARE ; After 4 days of ice start using heat.  Put ice on the injured area.  Put ice in a plastic bag.  Place a towel between your skin and the bag.  Leave the ice on for 15-20 minutes, 03-04 times a day.  Only take medicine as told by your doctor.  Rest the injured area.  If possible, raise (elevate) the injured area to lessen puffiness. GET HELP RIGHT AWAY IF:   You have more bruising or puffiness.  You have pain that is getting worse.  Your puffiness or pain is not helped by medicine. MAKE SURE YOU:   Understand these instructions.  Will watch your condition.  Will get help right away if you are not doing well or get worse. Document Released: 05/25/2008 Document Revised: 02/29/2012 Document Reviewed: 10/12/2011 [facility] Patient Information 2015 [address], [place]. This information is not intended to replace advice given to you by your health care provider. Make sure you discuss any questions you have with your health care provider.

## 2015-06-03 ENCOUNTER — Telehealth: Payer: Self-pay | Admitting: Internal Medicine

## 2015-06-03 NOTE — Telephone Encounter (Signed)
Pt requesting refill on Tramadol, please f/u with pt.  °

## 2015-06-04 ENCOUNTER — Telehealth: Payer: Self-pay

## 2015-06-04 ENCOUNTER — Other Ambulatory Visit: Payer: Self-pay | Admitting: Internal Medicine

## 2015-06-04 ENCOUNTER — Other Ambulatory Visit: Payer: Self-pay

## 2015-06-04 DIAGNOSIS — I10 Essential (primary) hypertension: Secondary | ICD-10-CM

## 2015-06-04 MED ORDER — TRAMADOL HCL 50 MG PO TABS: 50.0000 mg | ORAL_TABLET | Freq: Three times a day (TID) | ORAL | Status: DC | PRN

## 2015-06-04 MED ORDER — LOSARTAN POTASSIUM-HCTZ 100-25 MG PO TABS: 1.0000 | ORAL_TABLET | Freq: Every day | ORAL | Status: DC

## 2015-06-04 MED ORDER — METFORMIN HCL 500 MG PO TABS: 500.0000 mg | ORAL_TABLET | Freq: Two times a day (BID) | ORAL | Status: DC

## 2015-06-04 NOTE — Progress Notes (Unsigned)
Patient came into office to pick up her tramadol RX Patient is now requesting a refill on her metformin and losartan [facility]

## 2015-06-04 NOTE — Telephone Encounter (Signed)
Patient called requesting a refill on her tamadol Prescription printed and is ready for pick up at front desk

## 2015-07-02 ENCOUNTER — Ambulatory Visit: Payer: Self-pay | Attending: Internal Medicine | Admitting: Physician Assistant

## 2015-07-02 VITALS — BP 122/87 | HR 100 | Temp 98.7°F | Wt 259.2 lb

## 2015-07-02 DIAGNOSIS — J209 Acute bronchitis, unspecified: Secondary | ICD-10-CM

## 2015-07-02 DIAGNOSIS — E8801 Alpha-1-antitrypsin deficiency: Secondary | ICD-10-CM

## 2015-07-02 DIAGNOSIS — J4540 Moderate persistent asthma, uncomplicated: Secondary | ICD-10-CM

## 2015-07-02 MED ORDER — GUAIFENESIN-CODEINE 100-10 MG/5ML PO SOLN: 5.0000 mL | Freq: Four times a day (QID) | ORAL | Status: DC | PRN

## 2015-07-02 MED ORDER — PREDNISONE 20 MG PO TABS: 60.0000 mg | ORAL_TABLET | Freq: Every day | ORAL | Status: DC

## 2015-07-02 MED ORDER — GUAIFENESIN ER 600 MG PO TB12: 600.0000 mg | ORAL_TABLET | Freq: Two times a day (BID) | ORAL | Status: DC

## 2015-07-02 MED ORDER — AZITHROMYCIN 250 MG PO TABS: ORAL_TABLET | ORAL | Status: DC

## 2015-07-02 MED ORDER — TRAMADOL HCL 50 MG PO TABS: 50.0000 mg | ORAL_TABLET | Freq: Three times a day (TID) | ORAL | Status: DC | PRN

## 2015-07-02 NOTE — Progress Notes (Signed)
Chief Complaint: Cough, hoarseness Refill request  Subjective: This is a 44 year old African-American female with a history of alpha anti-trypsin one deficiency as well as chronic asthma who is on chronic steroids daily. She has been referred to pulmonary in the past and did not follow-up with these appointments. She is now requesting a repeat referral. She also has a history of diabetes mellitus type 2, hypertension, and hyperlipidemia. It seems that she attempts to manage her chronic medical conditions on her own instead of as prescribed by her clinician.  She is presenting with 1 week of increasing hoarseness, cough especially at night, and mucus drainage. She's had a lot of mucus down the back of her throat. No headaches. No chest pain. A little shortness of breath. She has been audibly wheezing. She's had a use her nebulization machine every 3 hours for the last 3 days.  She is requesting refills on her steroids and cough syrup. She's also requesting a refill on her tramadol.   ROS:  GEN: denies fever or chills, denies change in weight Skin: denies lesions or rashes HEENT: denies headache, earache, epistaxis, sore throat, or neck pain LUNGS: little SHOB, dyspnea, PND, orthopnea CV: denies CP or palpitations    Objective:  Filed Vitals:   07/02/15 0937  BP: 122/87  Pulse: 100  Temp: 98.7 F (37.1 C)  TempSrc: Oral  Weight: 259 lb 3.2 oz (117.572 kg)  SpO2: 97%    Physical Exam:  General: in no acute distress. HEENT: Normocephalic. Injected throat and ears bilaterally. No drainage. Right ear cerumen on the left than right. Supple neck. No lymphadenopathy. Heart: Normal  s1 &s2  Regular rate and rhythm, without murmurs, rubs, gallops. Lungs: few exp wheezes.   Pertinent Lab Results:none   Medications: Prior to Admission medications   Medication Sig Start Date End Date Taking? Authorizing Provider  albuterol (PROVENTIL HFA;VENTOLIN HFA) 108 (90 BASE) MCG/ACT inhaler  Inhale 2 puffs into the lungs every 6 (six) hours as needed for wheezing or shortness of breath. Sob 04/24/15  Yes Henrietta Hoover, NP  albuterol (PROVENTIL) (5 MG/ML) 0.5% nebulizer solution Take 0.5 mLs (2.5 mg total) by nebulization every 4 (four) hours as needed for wheezing or shortness of breath. Sob 04/24/15  Yes Henrietta Hoover, NP  beclomethasone (QVAR) 80 MCG/ACT inhaler Inhale 2 puffs into the lungs daily. 04/24/15  Yes Henrietta Hoover, NP  dapagliflozin propanediol (FARXIGA) 5 MG TABS tablet Take 5 mg by mouth daily. 04/24/15  Yes Henrietta Hoover, NP  diclofenac sodium (VOLTAREN) 1 % GEL Apply 1 application topically 4 (four) times daily. 05/30/15  Yes Hayden Rasmussen, NP  gabapentin (NEURONTIN) 100 MG capsule Take 1 capsule (100 mg total) by mouth 3 (three) times daily. 04/24/15  Yes Henrietta Hoover, NP  glipiZIDE (GLUCOTROL) 5 MG tablet Take 1 tablet (5 mg total) by mouth 2 (two) times daily before a meal. 04/25/15  Yes Henrietta Hoover, NP  ipratropium (ATROVENT) 0.06 % nasal spray Place 2 sprays into both nostrils 4 (four) times daily. 05/30/15  Yes Hayden Rasmussen, NP  losartan-hydrochlorothiazide (HYZAAR) 100-25 MG per tablet Take 1 tablet by mouth daily. 06/04/15  Yes Doris Cheadle, MD  metFORMIN (GLUCOPHAGE) 500 MG tablet Take 1 tablet (500 mg total) by mouth 2 (two) times daily with a meal. 06/04/15  Yes Deepak Advani, MD  methylPREDNISolone sodium succinate (SOLU-MEDROL) 125 mg/2 mL injection Inject 1.28 mLs (80 mg total) into the muscle once. 04/24/15  Yes Henrietta Hoover, NP  oxyCODONE-acetaminophen (PERCOCET) 5-325 MG per tablet Take 1 tablet by mouth every 4 (four) hours as needed. 12/11/14  Yes Pricilla Loveless, MD  potassium chloride SA (K-DUR,KLOR-CON) 20 MEQ tablet Take 1 tablet (20 mEq total) by mouth daily. 05/09/15  Yes Henrietta Hoover, NP  predniSONE (DELTASONE) 20 MG tablet Take 3 tablets (60 mg total) by mouth daily. 07/02/15  Yes Eurydice Calixto Netta Cedars, PA-C  simvastatin (ZOCOR) 40 MG tablet  Take 1 tablet (40 mg total) by mouth daily at 6 PM. 04/24/15  Yes Henrietta Hoover, NP  sitaGLIPtin-metformin (JANUMET) 50-1000 MG per tablet Take 1 tablet by mouth 2 (two) times daily with a meal. 04/11/15  Yes Doris Cheadle, MD  traMADol (ULTRAM) 50 MG tablet Take 1 tablet (50 mg total) by mouth every 8 (eight) hours as needed for moderate pain. 07/02/15  Yes Hurman Ketelsen Netta Cedars, PA-C  triamcinolone cream (KENALOG) 0.1 % Apply 1 application topically 2 (two) times daily. 05/30/15  Yes Hayden Rasmussen, NP  azithromycin (ZITHROMAX) 250 MG tablet 2 tabs today, 1 tab daily thereafter for 4 days 07/02/15   Vivianne Master, PA-C  guaiFENesin (MUCINEX) 600 MG 12 hr tablet Take 1 tablet (600 mg total) by mouth 2 (two) times daily. 07/02/15   Emilyn Ruble Netta Cedars, PA-C  guaiFENesin (ROBITUSSIN) 100 MG/5ML liquid Take 10 mLs (200 mg total) by mouth 3 (three) times daily as needed for cough. Patient not taking: Reported on 07/02/2015 05/28/15   Doris Cheadle, MD  guaiFENesin-codeine 100-10 MG/5ML syrup Take 5 mLs by mouth every 6 (six) hours as needed for cough. 07/02/15   Vivianne Master, PA-C  Multiple Vitamin (MULTIVITAMIN WITH MINERALS) TABS tablet Take 1 tablet by mouth daily.    Historical Provider, MD  Vitamin D, Ergocalciferol, (DRISDOL) 50000 UNITS CAPS capsule Take 1 capsule (50,000 Units total) by mouth every 7 (seven) days. Patient not taking: Reported on 07/02/2015 11/01/14   Doris Cheadle, MD    Assessment: 1. Acute bronchitis 2. Chronic persistent asthma  3. Alpha-1 antitrypsin 1 deficiency on chronic steroids 4. Hypertension-controlled 5. Diabetes mellitus type 2-uncontrolled, noncompliant with prescribed regimen 6. 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 She is urged to  keep her scheduled appointments with her primary provider.  Follow up:1 month  The patient was given clear instructions to go to ER or return to medical center if symptoms don't improve, worsen or new problems develop. The patient verbalized understanding. 26 minute office visit today.  This note has been created with Education officer, environmental. Any transcriptional errors are unintentional.   Scot Jun, PA-C 07/02/2015, 9:57 AM

## 2015-07-02 NOTE — Progress Notes (Signed)
Pt is here to follow up on Asthma and to get a refill on prednisone.

## 2015-07-16 ENCOUNTER — Institutional Professional Consult (permissible substitution): Payer: Self-pay | Admitting: Internal Medicine

## 2015-07-17 ENCOUNTER — Encounter: Payer: Self-pay | Admitting: Internal Medicine

## 2015-07-17 ENCOUNTER — Ambulatory Visit (INDEPENDENT_AMBULATORY_CARE_PROVIDER_SITE_OTHER): Payer: Self-pay | Admitting: Internal Medicine

## 2015-07-17 VITALS — BP 140/98 | HR 103 | Ht 64.0 in | Wt 256.4 lb

## 2015-07-17 DIAGNOSIS — I10 Essential (primary) hypertension: Secondary | ICD-10-CM

## 2015-07-17 DIAGNOSIS — J45991 Cough variant asthma: Secondary | ICD-10-CM

## 2015-07-17 MED ORDER — BUDESONIDE-FORMOTEROL FUMARATE 160-4.5 MCG/ACT IN AERO: INHALATION_SPRAY | RESPIRATORY_TRACT | Status: DC

## 2015-07-17 NOTE — Progress Notes (Signed)
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  Last admit reviewed from 08/2014 for RUL pna/ not asthma   07/17/2015 1st [facility] office visit/ Wert   Chief Complaint  Patient presents with  . Pulmonary Consult    Referred by Scot Jun, PA. Pt states that Asthma and AAT def yrs ago. She c/o cough and SOB that have been worse for the past 2 wks. Cough is prod with yellow sputum and is esp worse at night until the point her ribs feel sore. She states she is SOB "all of the time" with or without any exertion.  She uses albuterol neb every 3 hours regularly and albuterol inhaler approx 8 x per day.   presently on 60 mg prednisone which is the lowest she's been on x years plus qvar with severe paroxyms of cough to point of gen rib pain / gagging.   No obvious other patterns in day to day or daytime variabilty or assoc  chest tightness, subjective wheeze overt sinus or hb symptoms. No unusual exp hx or h/o childhood pna/ asthma or knowledge of premature birth.  Sleeping at 30 degrees on cpap.  Also denies any obvious fluctuation of symptoms with weather or environmental changes or other aggravating or alleviating factors except as outlined above   Current Medications, Allergies, Complete Past Medical History, Past Surgical History, Family History, and Social History were reviewed in [employer] record.          Review of Systems  Constitutional: Negative for fever,  chills and unexpected weight change.  HENT: Positive for sneezing. Negative for congestion, dental problem, ear pain, nosebleeds, postnasal drip, rhinorrhea, sinus pressure, sore throat, trouble swallowing and voice change.   Eyes: Negative for visual disturbance.  Respiratory: Positive for cough and shortness of breath. Negative for choking.   Cardiovascular: Negative for chest pain and leg swelling.  Gastrointestinal: Positive for abdominal pain. Negative for vomiting and diarrhea.  Genitourinary: Negative for difficulty urinating.       Acid heartburn Indigestion  Musculoskeletal: Negative for arthralgias.  Skin: Negative for rash.  Neurological: Positive for headaches. Negative for tremors and syncope.  Hematological: Does not bruise/bleed easily.       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 Nl external ear canals without cough reflex   NECK :  without JVD/Nodes/TM/ nl carotid upstrokes bilaterally   LUNGS: no acc muscle use, clear to A and P with plm/ otherwise prominent upper airway "wheeze"    CV:  RRR  no s3 or murmur or increase in P2, no edema   ABD:  Tensely obese/  nontender with very poor  excursion in the supine position. No bruits or organomegaly, bowel sounds nl  MS:  warm without deformities, calf tenderness, cyanosis or clubbing  SKIN: warm and dry without lesions    NEURO:  alert, approp, no deficits     No recent cxr on file     Assessment & Plan:

## 2015-07-17 NOTE — Patient Instructions (Addendum)
We need your last clinic note from [facility] (sign release) andyour last visit with Dr Lorrin Mais   Stop qvar and start symbicort 160 Take 2 puffs first thing in am and then another 2 puffs about 12 hours later.   GERD (REFLUX)  is an extremely common cause of respiratory symptoms just like yours , many times with no obvious heartburn at all.    It can be treated with medication, but also with lifestyle changes including elevation of the head of your bed (ideally with 6 inch  bed blocks),  Smoking cessation, avoidance of late meals, excessive alcohol, and avoid fatty foods, chocolate, peppermint, colas, red wine, and acidic juices such as orange juice.  NO MINT OR MENTHOL PRODUCTS SO NO COUGH DROPS  USE SUGARLESS CANDY INSTEAD (Jolley ranchers or Stover's or Life Savers) or even ice chips will also do - the key is to swallow to prevent all throat clearing. NO OIL BASED VITAMINS - use powdered substitutes.   Pepcid 20 mg one at bedtime   Please schedule a follow up office visit in 2 weeks, sooner if needed  Needs cxr on return

## 2015-07-18 ENCOUNTER — Encounter: Payer: Self-pay | Admitting: Internal Medicine

## 2015-07-18 DIAGNOSIS — J45991 Cough variant asthma: Secondary | ICD-10-CM | POA: Insufficient documentation

## 2015-07-18 NOTE — Assessment & Plan Note (Signed)
Noted poor control on hyzar and For reasons that may related to vascular permability and nitric oxide pathways but not elevated  bradykinin levels (as seen with  ACEi use) losartan in the generic form has been reported now from mulitple sources  to cause a similar pattern of non-specific  upper airway symptoms as seen with acei.   This has not been reported with exposure to the other ARB's to date, so it seems reasonable for now to try either generic diovan or avapro if ARB needed or use an alternative class altogether.  See:  Dewayne Hatch Allergy Asthma Immunol  2008: 101: p [phone]    Would rec change to valsartan or ibesartan but defer to primary care for now.

## 2015-07-18 NOTE — Assessment & Plan Note (Signed)
I am extremely doubtful that she truly has "refractory asthma" or AAT def and have requested records from [facility].  She either has cough variant asthma or the cough is from  Classic Upper airway cough syndrome, so named because it's frequently impossible to sort out how much is  CR/sinusitis with freq throat clearing (which can be related to primary GERD)   vs  causing  secondary (" extra esophageal")  GERD from wide swings in gastric pressure that occur with throat clearing, often  promoting self use of mint and menthol lozenges that reduce the lower esophageal sphincter tone and exacerbate the problem further in a cyclical fashion.   These are the same pts (now being labeled as having "irritable larynx syndrome" by some cough centers) who not infrequently have a history of having failed to tolerate ace inhibitors,  dry powder inhalers or biphosphonates or report having atypical reflux symptoms that don't respond to standard doses of PPI , and are easily confused as having aecopd or asthma flares by even experienced allergists/ pulmonologists.   For now rec max acid rx/ diet and await records.

## 2015-07-18 NOTE — Assessment & Plan Note (Signed)
Body mass index is 43.99 kg/(m^2).  Lab Results  Component Value Date   TSH 0.834 03/27/2014     Contributing to gerd tendency/ doe/reviewed need  achieve and maintain neg calorie balance > defer f/u primary care including intermittently monitoring thyroid status

## 2015-08-01 ENCOUNTER — Ambulatory Visit (INDEPENDENT_AMBULATORY_CARE_PROVIDER_SITE_OTHER)
Admission: RE | Admit: 2015-08-01 | Discharge: 2015-08-01 | Disposition: A | Discharge status: Home / Self Care | Payer: Self-pay | Source: Ambulatory Visit | Attending: Internal Medicine | Admitting: Internal Medicine

## 2015-08-01 ENCOUNTER — Other Ambulatory Visit: Payer: Self-pay

## 2015-08-01 ENCOUNTER — Encounter: Payer: Self-pay | Admitting: Family Medicine

## 2015-08-01 ENCOUNTER — Ambulatory Visit: Payer: Self-pay | Attending: Family Medicine | Admitting: Family Medicine

## 2015-08-01 ENCOUNTER — Ambulatory Visit (INDEPENDENT_AMBULATORY_CARE_PROVIDER_SITE_OTHER): Payer: Self-pay | Admitting: Internal Medicine

## 2015-08-01 ENCOUNTER — Encounter: Payer: Self-pay | Admitting: Internal Medicine

## 2015-08-01 VITALS — BP 117/83 | HR 102 | Temp 99.0°F | Resp 16 | Ht 64.0 in | Wt 259.0 lb

## 2015-08-01 VITALS — BP 110/80 | HR 107 | Ht 64.0 in | Wt 256.0 lb

## 2015-08-01 DIAGNOSIS — E8801 Alpha-1-antitrypsin deficiency: Secondary | ICD-10-CM

## 2015-08-01 DIAGNOSIS — J45909 Unspecified asthma, uncomplicated: Secondary | ICD-10-CM | POA: Insufficient documentation

## 2015-08-01 DIAGNOSIS — J45991 Cough variant asthma: Secondary | ICD-10-CM

## 2015-08-01 DIAGNOSIS — E119 Type 2 diabetes mellitus without complications: Secondary | ICD-10-CM

## 2015-08-01 DIAGNOSIS — Z87891 Personal history of nicotine dependence: Secondary | ICD-10-CM | POA: Insufficient documentation

## 2015-08-01 DIAGNOSIS — E1142 Type 2 diabetes mellitus with diabetic polyneuropathy: Secondary | ICD-10-CM

## 2015-08-01 DIAGNOSIS — N912 Amenorrhea, unspecified: Secondary | ICD-10-CM | POA: Insufficient documentation

## 2015-08-01 DIAGNOSIS — Z23 Encounter for immunization: Secondary | ICD-10-CM

## 2015-08-01 DIAGNOSIS — I10 Essential (primary) hypertension: Secondary | ICD-10-CM

## 2015-08-01 DIAGNOSIS — G894 Chronic pain syndrome: Secondary | ICD-10-CM

## 2015-08-01 DIAGNOSIS — N951 Menopausal and female climacteric states: Secondary | ICD-10-CM

## 2015-08-01 DIAGNOSIS — E139 Other specified diabetes mellitus without complications: Secondary | ICD-10-CM

## 2015-08-01 LAB — POCT URINE PREGNANCY: Preg Test, Ur: NEGATIVE

## 2015-08-01 LAB — GLUCOSE, POCT (MANUAL RESULT ENTRY): POC GLUCOSE: 155 mg/dL — AB (ref 70–99)

## 2015-08-01 LAB — POCT GLYCOSYLATED HEMOGLOBIN (HGB A1C): Hemoglobin A1C: 6.7

## 2015-08-01 MED ORDER — GUAIFENESIN-CODEINE 100-10 MG/5ML PO SOLN: 10.0000 mL | ORAL | Status: DC | PRN

## 2015-08-01 MED ORDER — PANTOPRAZOLE SODIUM 40 MG PO TBEC: 40.0000 mg | DELAYED_RELEASE_TABLET | Freq: Every day | ORAL | Status: DC

## 2015-08-01 MED ORDER — NEBIVOLOL HCL 10 MG PO TABS: 10.0000 mg | ORAL_TABLET | Freq: Every day | ORAL | Status: DC

## 2015-08-01 MED ORDER — PREDNISONE 20 MG PO TABS: 60.0000 mg | ORAL_TABLET | Freq: Every day | ORAL | Status: DC

## 2015-08-01 MED ORDER — ACETAMINOPHEN-CODEINE #3 300-30 MG PO TABS: 1.0000 | ORAL_TABLET | Freq: Three times a day (TID) | ORAL | Status: DC | PRN

## 2015-08-01 MED ORDER — VENLAFAXINE HCL ER 75 MG PO CP24: 75.0000 mg | ORAL_CAPSULE | Freq: Every day | ORAL | Status: DC

## 2015-08-01 NOTE — Patient Instructions (Signed)
Carla Hicks,  Thank you for coming in today. It was a pleasure meeting you. I look forward to being your primary doctor.   1. Chronic pain: Tylenol #3 as tramadol may interact with Effexor  2. Hot flashes and no periods- consistent with perimenopause No period for one whole year is menopause  To help with symptoms start Effexor 75 mg daily   3. Asthma:  Continue care plan from Dr. Sherene Sires  F/u in 6 weeks with RN for flu shot   Dr. Armen Pickup

## 2015-08-01 NOTE — Progress Notes (Signed)
Establish Care with new pcp Irregular menses last cycle May 31 2015 F/U DM,  Asthma

## 2015-08-01 NOTE — Assessment & Plan Note (Signed)
Chronic pain: Tylenol #3 as tramadol may interact with Effexor

## 2015-08-01 NOTE — Assessment & Plan Note (Signed)
Hot flashes and no periods- consistent with perimenopause No period for one whole year is menopause

## 2015-08-01 NOTE — Progress Notes (Signed)
   Subjective:    Patient ID: Carla Hicks, female    DOB: 1971/02/11, 44 y.o.   MRN: [id number] CC: f/u DM, asthma, amenorrhea  HPI   1. CHRONIC DIABETES  Disease Monitoring  Blood Sugar Ranges: 150s   Polyuria: no   Visual problems: no   Medication Compliance: yes  Medication Side Effects  Hypoglycemia: no   2. 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 Sherene Sires today. Dr. Sherene Sires has refilled prednisone, cough medicine with codeine and changed patient from hyzaar and bystolic.   3. Amenorrhea: x 2 months. LMP 05/31/2015. Having hot flashes. Noticing more irritability. Having trouble sleeping.   Social History  Substance Use Topics  . Smoking status: Former Smoker -- 1.50 packs/day for 25 years    Types: Cigarettes    Quit date: 12/22/2011  . Smokeless tobacco: Never Used  . Alcohol Use: No    Review of Systems  Constitutional: Negative for fever and chills.  Eyes: Negative for visual disturbance.  Respiratory: Negative for shortness of breath.   Cardiovascular: Negative for chest pain.  Gastrointestinal: Negative for abdominal pain and blood in stool.  Genitourinary: Positive for menstrual problem.  Musculoskeletal: Positive for myalgias (in upper chest and back ). Negative for back pain and arthralgias.  Skin: Negative for rash.  Allergic/Immunologic: Negative for immunocompromised state.  Hematological: Negative for adenopathy. Does not bruise/bleed easily.  Psychiatric/Behavioral: Negative for suicidal ideas and dysphoric mood.       Objective:   Physical Exam  Constitutional: She is oriented to person, place, and time. She appears well-developed and well-nourished. No distress.  Obese   HENT:  Head: Normocephalic and atraumatic.  Cardiovascular: Normal rate, regular rhythm, normal heart sounds and intact distal pulses.   Pulmonary/Chest: Effort normal. No respiratory distress. She has rhonchi.  Musculoskeletal: She exhibits no  edema.  Neurological: She is alert and oriented to person, place, and time.  Skin: Skin is warm and dry. No rash noted.  Psychiatric: She has a normal mood and affect.  BP 117/83 mmHg  Pulse 102  Temp(Src) 99 F (37.2 C) (Oral)  Resp 16  Ht 5\' 4"  (1.626 m)  Wt 259 lb (117.482 kg)  BMI 44.44 kg/m2  SpO2 96%  LMP 05/31/2015 Wt Readings from Last 3 Encounters:  08/01/15 259 lb (117.482 kg)  08/01/15 256 lb (116.121 kg)  07/17/15 256 lb 6.4 oz (116.302 kg)     Lab Results  Component Value Date   HGBA1C 6.7 08/01/2015   CBG 155    Assessment & Plan:

## 2015-08-01 NOTE — Assessment & Plan Note (Addendum)
A: diabetes well controlled.  P:  Continue current care plan

## 2015-08-01 NOTE — Progress Notes (Signed)
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  Last admit reviewed from 08/2014 for RUL pna/ not asthma   07/17/2015 1st [facility] office visit/ Juanjose Mojica   Chief Complaint  Patient presents with  . Pulmonary Consult    Referred by Scot Jun, PA. Pt states that Asthma and AAT def yrs ago. She c/o cough and SOB that have been worse for the past 2 wks. Cough is prod with yellow sputum and is esp worse at night until the point her ribs feel sore. She states she is SOB "all of the time" with or without any exertion.  She uses albuterol neb every 3 hours regularly and albuterol inhaler approx 8 x per day.   presently on 60 mg prednisone which is the lowest she's been on x years plus qvar with severe paroxyms of cough to point of gen rib pain / gagging.  rec We need your last clinic note from [facility] (sign release) andyour last visit with Dr Lorrin Mais  Stop qvar and start symbicort 160 Take 2 puffs first thing in am and then another 2 puffs about 12 hours later.  GERD diet   Pepcid 20 mg one at bedtime  Please schedule a follow up office visit in 2 weeks, sooner if needed    08/01/2015 f/u ov/Zaivion Kundrat re: asthma ? 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 Follow-up    Breathing is unchanged since last OV. No new complaints today.      Worse problem is hs severe  cough w/in one hour > better if elevate x 30 degrees   No obvious day to day or daytime variability or assoc chronic cough or cp or chest tightness, subjective wheeze or overt sinus or hb symptoms. No unusual exp hx or h/o childhood pna or knowledge of premature birth.  Also denies any obvious fluctuation of symptoms with weather or environmental changes or other aggravating or alleviating factors except as outlined above   Current Medications, Allergies, Complete Past Medical History, Past Surgical History, Family History, and Social History were reviewed in [employer] record.  ROS  The following are not active complaints unless bolded sore throat, dysphagia, dental problems, itching, sneezing,  nasal congestion or excess/ purulent secretions, ear ache,   fever, chills, sweats, unintended wt loss, classically pleuritic or exertional cp, hemoptysis,  orthopnea pnd or leg swelling, presyncope, palpitations, abdominal pain, anorexia, nausea, vomiting, diarrhea  or change in bowel or bladder habits, change in stools or urine, dysuria,hematuria,  rash, arthralgias, visual complaints, headache, numbness, weakness or ataxia or problems with walking or coordination,  change in mood/affect or memory.                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 Nl external ear canals without cough reflex   NECK :  without JVD/Nodes/TM/ nl carotid upstrokes bilaterally   LUNGS: no acc muscle use, clear to A and P with plm/ otherwise prominent upper airway "wheeze"    CV:  RRR  no s3 or murmur or increase in P2, no edema   ABD:  Tensely obese/  nontender with very poor  excursion in the  supine position. No bruits or organomegaly, bowel sounds nl  MS:  warm without deformities, calf tenderness, cyanosis or clubbing  SKIN: warm and dry without lesions    NEURO:  alert, approp, no deficits   CXR PA and Lateral:   08/01/2015 :     I personally reviewed images and agree with radiology impression as follows:    IMPRESSION: Chronic bronchitic changes without acute cardiopulmonary disease. Resolution of inferior RIGHT upper lobe pneumonia.  Labs ordered: alpha one profile        Assessment & Plan:

## 2015-08-01 NOTE — Patient Instructions (Addendum)
Work on inhaler technique:  relax and gently blow all the way out then take a nice smooth deep breath back in, triggering the inhaler at same time you start breathing in.  Hold for up to 5 seconds if you can.  Rinse and gargle with water when done  Continue symbicort 160 Take 2 puffs first thing in am and then another 2 puffs about 12 hours later.   Stop hyzaar  Bystolic 10 mg daily   Pantoprazole (protonix) 40 mg   Take  30-60 min before first meal of the day and Pepcid (famotidine)  20 mg one @  bedtime until return to office - this is the best way to tell whether stomach acid is contributing to your problem.    GERD (REFLUX)  is an extremely common cause of respiratory symptoms just like yours , many times with no obvious heartburn at all.    It can be treated with medication, but also with lifestyle changes including elevation of the head of your bed (ideally with 6 inch  bed blocks),  Smoking cessation, avoidance of late meals, excessive alcohol, and avoid fatty foods, chocolate, peppermint, colas, red wine, and acidic juices such as orange juice.  NO MINT OR MENTHOL PRODUCTS SO NO COUGH DROPS  USE SUGARLESS CANDY INSTEAD (Jolley ranchers or Stover's or Life Savers) or even ice chips will also do - the key is to swallow to prevent all throat clearing. NO OIL BASED VITAMINS - use powdered substitutes.  Take enough cough syrup to stop the cough completely x 3-5 straight days  Please remember to go to the lab and x-ray department downstairs for your tests - we will call you with the results when they are available.  See Tammy NP w/in 2 weeks with all your medications, even over the counter meds, separated in two separate bags, the ones you take no matter what vs the ones you stop once you feel better and take only as needed when you feel you need them.    Not a med calendar visit.

## 2015-08-02 ENCOUNTER — Telehealth: Payer: Self-pay | Admitting: Internal Medicine

## 2015-08-02 LAB — MICROALBUMIN, URINE: Microalb, Ur: 7.2 mg/dL — ABNORMAL HIGH (ref <2.0)

## 2015-08-02 NOTE — Progress Notes (Signed)
Quick Note:  Spoke with pt and notified of results per Dr. Wert. Pt verbalized understanding and denied any questions.  ______ 

## 2015-08-02 NOTE — Progress Notes (Signed)
Quick Note:  LMTCB ______ 

## 2015-08-02 NOTE — Telephone Encounter (Signed)
lmtcb

## 2015-08-05 ENCOUNTER — Encounter: Payer: Self-pay | Admitting: Internal Medicine

## 2015-08-05 LAB — ALPHA-1-ANTITRYPSIN: A-1 Antitrypsin, Ser: 110 mg/dL (ref 83–199)

## 2015-08-05 NOTE — Telephone Encounter (Signed)
Patient notified of CXR results. Patient awaiting results from bloodwork which are still pending Patient advised that we will call her once the bloodwork has been resulted. Nothing further needed.

## 2015-08-05 NOTE — Assessment & Plan Note (Signed)
For reasons that may related to vascular permability and nitric oxide pathways but not elevated  bradykinin levels (as seen with  ACEi use) losartan in the generic form has been reported now from mulitple sources  to cause a similar pattern of non-specific  upper airway symptoms as seen with acei.   This has not been reported with exposure to the other ARB's to date, so it seems reasonable for now to try to use an alternative class altogether.   Strongly prefer in this setting: Bystolic, the most beta -1  selective Beta blocker available in sample form, with bisoprolol the most selective generic choice  on the market.  Try bystolic 10 mg samples for now  See:  [facility] Allergy Asthma Immunol  2008: 101: p [phone]

## 2015-08-05 NOTE — Assessment & Plan Note (Addendum)
07/17/2015 p extensive coaching HFA effectiveness =    75% from a baseline of 25%>>Try off qvar and on symbicort 160 2bid   DDX of  difficult airways management all start with A and  include Adherence, Ace Inhibitors, Acid Reflux, Active Sinus Disease, Alpha 1 Antitripsin deficiency, Anxiety masquerading as Airways dz,  ABPA,  allergy(esp in young), Aspiration (esp in elderly), Adverse effects of meds,  Active smokers, A bunch of PE's (a small clot burden can't cause this syndrome unless there is already severe underlying pulm or vascular dz with poor reserve) plus two Bs  = Bronchiectasis and Beta blocker use..and one C= CHF  Adherence is always the initial "prime suspect" and is a multilayered concern that requires a "trust but verify" approach in every patient - starting with knowing how to use medications, especially inhalers, correctly, keeping up with refills and understanding the fundamental difference between maintenance and prns vs those medications only taken for a very short course and then stopped and not refilled.  The proper method of use, as well as anticipated side effects, of a metered-dose inhaler are discussed and demonstrated to the patient. Improved effectiveness after extensive coaching during this visit to a level of approximately  75% so keep working on technique  ? Alpha one AT > no records from [facility] > recheck profile  ? Allergy > doubt strongly on pred 60  ? Acid (or non-acid) GERD > always difficult to exclude as up to 75% of pts in some series report no assoc GI/ Heartburn symptoms> rec max (24h)  acid suppression and diet restrictions/ reviewed and instructions given in writing.   ? Anxiety /munchausen's pattern with elaborate hx disproportionate so far to objective evidence of dz  Will request again old record and have her return for full med rec.   To keep things simple, I have asked the patient to first separate medicines that are perceived as maintenance, that is to be  taken daily "no matter what", from those medicines that are taken on only on an as-needed basis and I have given the patient examples of both, and then return to see our NP to generate a  detailed  medication calendar which should be followed until the next physician sees the patient and updates it.

## 2015-08-07 LAB — ALPHA-1 ANTITRYPSIN PHENOTYPE: A1 ANTITRYPSIN: 112 mg/dL (ref 83–199)

## 2015-08-09 NOTE — Progress Notes (Signed)
Quick Note:  Called and spoke to pt. Informed her of the results and recs per MW. Pt verbalized understanding and denied any further questions or concerns at this time. ______ 

## 2015-08-14 ENCOUNTER — Telehealth: Payer: Self-pay | Admitting: *Deleted

## 2015-08-14 NOTE — Telephone Encounter (Signed)
-----   Message from Dessa Phi, MD sent at 08/02/2015  9:11 AM EDT ----- microalbumin normal

## 2015-08-14 NOTE — Telephone Encounter (Signed)
Pt aware of lab results Date of birth verified.

## 2015-08-15 ENCOUNTER — Other Ambulatory Visit: Payer: Self-pay | Admitting: *Deleted

## 2015-08-15 MED ORDER — SIMVASTATIN 40 MG PO TABS: 40.0000 mg | ORAL_TABLET | Freq: Every day | ORAL | Status: DC

## 2015-08-19 ENCOUNTER — Encounter: Payer: Self-pay | Admitting: Adult Health

## 2015-09-12 ENCOUNTER — Ambulatory Visit: Payer: Self-pay | Admitting: Pharmacist

## 2015-09-17 ENCOUNTER — Telehealth: Payer: Self-pay | Admitting: Family Medicine

## 2015-09-17 NOTE — Telephone Encounter (Signed)
Patient called and requested med refills for all of her current medications. Patient would also like to speak to nurse. Please f/u with pt.

## 2015-09-18 ENCOUNTER — Ambulatory Visit: Payer: Self-pay | Attending: Family Medicine | Admitting: Physician Assistant

## 2015-09-18 VITALS — BP 129/92 | HR 96 | Temp 98.0°F | Resp 18 | Ht 64.0 in | Wt 262.0 lb

## 2015-09-18 DIAGNOSIS — R300 Dysuria: Secondary | ICD-10-CM | POA: Insufficient documentation

## 2015-09-18 DIAGNOSIS — E1140 Type 2 diabetes mellitus with diabetic neuropathy, unspecified: Secondary | ICD-10-CM | POA: Insufficient documentation

## 2015-09-18 DIAGNOSIS — Z87891 Personal history of nicotine dependence: Secondary | ICD-10-CM | POA: Insufficient documentation

## 2015-09-18 DIAGNOSIS — E8801 Alpha-1-antitrypsin deficiency: Secondary | ICD-10-CM

## 2015-09-18 DIAGNOSIS — N951 Menopausal and female climacteric states: Secondary | ICD-10-CM

## 2015-09-18 DIAGNOSIS — I10 Essential (primary) hypertension: Secondary | ICD-10-CM | POA: Insufficient documentation

## 2015-09-18 DIAGNOSIS — J4540 Moderate persistent asthma, uncomplicated: Secondary | ICD-10-CM | POA: Insufficient documentation

## 2015-09-18 LAB — POCT URINALYSIS DIPSTICK
Bilirubin, UA: NEGATIVE
GLUCOSE UA: NEGATIVE
KETONES UA: NEGATIVE
Leukocytes, UA: NEGATIVE
Nitrite, UA: NEGATIVE
PROTEIN UA: 30
SPEC GRAV UA: 1.02
Urobilinogen, UA: 0.2
pH, UA: 5.5

## 2015-09-18 LAB — BASIC METABOLIC PANEL
BUN: 6 mg/dL — ABNORMAL LOW (ref 7–25)
CHLORIDE: 98 mmol/L (ref 98–110)
CO2: 28 mmol/L (ref 20–31)
CREATININE: 0.78 mg/dL (ref 0.50–1.10)
Calcium: 9.1 mg/dL (ref 8.6–10.2)
Glucose, Bld: 177 mg/dL — ABNORMAL HIGH (ref 65–99)
POTASSIUM: 4 mmol/L (ref 3.5–5.3)
SODIUM: 137 mmol/L (ref 135–146)

## 2015-09-18 MED ORDER — SIMVASTATIN 40 MG PO TABS: 40.0000 mg | ORAL_TABLET | Freq: Every day | ORAL | Status: DC

## 2015-09-18 MED ORDER — CIPROFLOXACIN HCL 500 MG PO TABS: 500.0000 mg | ORAL_TABLET | Freq: Two times a day (BID) | ORAL | Status: DC

## 2015-09-18 MED ORDER — TRAMADOL HCL 50 MG PO TABS: 50.0000 mg | ORAL_TABLET | Freq: Three times a day (TID) | ORAL | Status: DC | PRN

## 2015-09-18 MED ORDER — BUDESONIDE-FORMOTEROL FUMARATE 160-4.5 MCG/ACT IN AERO: INHALATION_SPRAY | RESPIRATORY_TRACT | Status: DC

## 2015-09-18 MED ORDER — VENLAFAXINE HCL ER 75 MG PO CP24: 75.0000 mg | ORAL_CAPSULE | Freq: Every day | ORAL | Status: DC

## 2015-09-18 MED ORDER — GLIPIZIDE 5 MG PO TABS: 5.0000 mg | ORAL_TABLET | Freq: Two times a day (BID) | ORAL | Status: DC

## 2015-09-18 MED ORDER — METFORMIN HCL 500 MG PO TABS: 500.0000 mg | ORAL_TABLET | Freq: Two times a day (BID) | ORAL | Status: DC

## 2015-09-18 MED ORDER — NEBIVOLOL HCL 10 MG PO TABS: 10.0000 mg | ORAL_TABLET | Freq: Every day | ORAL | Status: DC

## 2015-09-18 MED ORDER — PREDNISONE 20 MG PO TABS: 60.0000 mg | ORAL_TABLET | Freq: Every day | ORAL | Status: DC

## 2015-09-18 MED ORDER — HYDROCHLOROTHIAZIDE 25 MG PO TABS: 25.0000 mg | ORAL_TABLET | Freq: Every day | ORAL | Status: DC

## 2015-09-18 MED ORDER — PANTOPRAZOLE SODIUM 40 MG PO TBEC: 40.0000 mg | DELAYED_RELEASE_TABLET | Freq: Every day | ORAL | Status: DC

## 2015-09-18 MED ORDER — GABAPENTIN 300 MG PO CAPS: 300.0000 mg | ORAL_CAPSULE | Freq: Two times a day (BID) | ORAL | Status: DC

## 2015-09-18 NOTE — Telephone Encounter (Signed)
Patient called and requested med refills for all of her current medications. Patient would also like to speak to nurse.  Patient is completely out of her medication, she ran out last week. Please follow up.

## 2015-09-18 NOTE — Progress Notes (Signed)
Pt's here for asthma and foot pain. Pt states that she ran out of meds x6 days ago. Pt also states she having pain on the left. Pain level 6. Pt hasn't taken in meds today. Pt states urine is cloudy when going to the bathroom.

## 2015-09-18 NOTE — Progress Notes (Signed)
Chief Complaint: Difficulty urinating, med refills, foot pain, BP pills  Subjective: This is a 44 year old female with multiple complaints today. She has a history of diabetes mellitus type 2, hypertension and hyperlipidemia. She also has asthma and has been seen recently by pulmonologist.  1. She is stating that she needs refills on almost all of her medications. 2. For the last 4-5 days she's been having some difficulty urinating. She states that occasionally he does karate. He is a little malodorous at times. There is no blood. sSme discomfort on the left flank area. Previously she was chronically taking Bactrim Mondays, Wednesdays and Fridays to prevent urinary tract infections. This regimen has been discontinued for quite some time. 3. When seen by the pulmonologist recently her blood pressure regimen was changed. Her losartan was discontinued. She was started on Bystolic. She was only given samples. She states she doesn't have refills of her by [facility]. When she ran out of samples, she started taking her sister's hydrochlorothiazide. She's been taking 25 mg tablets 3 times daily. 4. Lastly, she is a diabetic and having a lot of lower extremity burning and tingling. Is worse at night. Better with walking. She's on gabapentin 100 mg 3 times daily.  ROS:  GEN: denies fever or chills, denies change in weight Skin: denies lesions or rashes HEENT: denies headache, earache, epistaxis, sore throat, or neck pain LUNGS: denies SHOB, dyspnea, PND, orthopnea CV: denies CP or palpitations ABD: denies abd pain, N or V EXT: denies muscle spasms or swelling; no pain in lower ext, no weakness NEURO: denies numbness or tingling, denies sz, stroke or TIA   Objective:  Filed Vitals:   09/18/15 1105  BP: 129/92  Pulse: 96  Temp: 98 F (36.7 C)  TempSrc: Oral  Resp: 18  Height:  (1.626 m)  Weight: 262 lb (118.842 kg)  SpO2: 98%    Physical Exam:  General: in no acute distress. HEENT: no  pallor, no icterus, moist oral mucosa, no JVD, no lymphadenopathy Heart: Normal  s1 &s2  Regular rate and rhythm, without murmurs, rubs, gallops. Lungs: Clear to auscultation bilaterally. Abdomen: Soft, nontender, nondistended, positive bowel sounds. Extremities: No clubbing cyanosis or edema with positive pedal pulses. Neuro: Alert, awake, oriented x3, nonfocal.  Pertinent Lab Results:urine dipstick reviewed   Medications: Prior to Admission medications   Medication Sig Start Date End Date Taking? Authorizing Provider  acetaminophen-codeine (TYLENOL #3) 300-30 MG per tablet Take 1 tablet by mouth every 8 (eight) hours as needed for moderate pain. 08/01/15  Yes Josalyn Funches, MD  albuterol (PROVENTIL HFA;VENTOLIN HFA) 108 (90 BASE) MCG/ACT inhaler Inhale 2 puffs into the lungs every 6 (six) hours as needed for wheezing or shortness of breath. Sob 04/24/15  Yes Henrietta Hoover, NP  albuterol (PROVENTIL) (5 MG/ML) 0.5% nebulizer solution Take 0.5 mLs (2.5 mg total) by nebulization every 4 (four) hours as needed for wheezing or shortness of breath. Sob 04/24/15  Yes Henrietta Hoover, NP  budesonide-formoterol [facility]) 160-4.5 MCG/ACT inhaler Take 2 puffs first thing in am and then another 2 puffs about 12 hours later. 09/18/15  Yes Tiffany Netta Cedars, PA-C  diclofenac sodium (VOLTAREN) 1 % GEL Apply 1 application topically 4 (four) times daily. 05/30/15  Yes Hayden Rasmussen, NP  glipiZIDE (GLUCOTROL) 5 MG tablet Take 1 tablet (5 mg total) by mouth 2 (two) times daily before a meal. 09/18/15  Yes Tiffany Netta Cedars, PA-C  guaiFENesin-codeine 100-10 MG/5ML syrup Take 10 mLs by mouth every 4 (  four) hours as needed for cough. 08/01/15  Yes Nyoka Cowden, MD  metFORMIN (GLUCOPHAGE) 500 MG tablet Take 1 tablet (500 mg total) by mouth 2 (two) times daily with a meal. 09/18/15  Yes Vivianne Master, PA-C  Multiple Vitamin (MULTIVITAMIN WITH MINERALS) TABS tablet Take 1 tablet by mouth daily.   Yes Historical Provider, MD   pantoprazole (PROTONIX) 40 MG tablet Take 1 tablet (40 mg total) by mouth daily. Take 30-60 min before first meal of the day 09/18/15  Yes Tiffany S Noel, PA-C  potassium chloride SA (K-DUR,KLOR-CON) 20 MEQ tablet Take 1 tablet (20 mEq total) by mouth daily. 05/09/15  Yes Henrietta Hoover, NP  predniSONE (DELTASONE) 20 MG tablet Take 3 tablets (60 mg total) by mouth daily. 09/18/15  Yes Tiffany Netta Cedars, PA-C  simvastatin (ZOCOR) 40 MG tablet Take 1 tablet (40 mg total) by mouth daily at 6 PM. 09/18/15  Yes Vivianne Master, PA-C  venlafaxine XR (EFFEXOR XR) 75 MG 24 hr capsule Take 1 capsule (75 mg total) by mouth daily with breakfast. 09/18/15  Yes Tiffany Netta Cedars, PA-C  ciprofloxacin (CIPRO) 500 MG tablet Take 1 tablet (500 mg total) by mouth 2 (two) times daily. 09/18/15   Vivianne Master, PA-C  gabapentin (NEURONTIN) 300 MG capsule Take 1 capsule (300 mg total) by mouth 2 (two) times daily. 09/18/15   Tiffany Netta Cedars, PA-C  guaiFENesin (MUCINEX) 600 MG 12 hr tablet Take 1 tablet (600 mg total) by mouth 2 (two) times daily. Patient not taking: Reported on 09/18/2015 07/02/15   Vivianne Master, PA-C  hydrochlorothiazide (HYDRODIURIL) 25 MG tablet Take 1 tablet (25 mg total) by mouth daily. 09/18/15   Tiffany Netta Cedars, PA-C  nebivolol (BYSTOLIC) 10 MG tablet Take 1 tablet (10 mg total) by mouth daily. 09/18/15   Vivianne Master, PA-C    Assessment: 1. Asthma 2. Dysuria 3. HTN 4. DM2 with Neuropathy  Plan: Refilled asthmatic meds. Cont f/u with Pulm as scheduled Cipro Refilled Bystolic. Started HCTZ 25 mg daily. BMP today. Increased Gabapentin 300 mg BID  Follow up:4 weeks with Dr. Armen Pickup  The patient was given clear instructions to go to ER or return to medical center if symptoms don't improve, worsen or new problems develop. The patient verbalized understanding. The patient was told to call to get lab results if they haven't heard anything in the next week.   This note has been created with Biomedical engineer. Any transcriptional errors are unintentional.   Scot Jun, PA-C 09/18/2015, 11:43 AM

## 2015-09-19 NOTE — Telephone Encounter (Signed)
Return pt call LVM to return call if assistant needed

## 2015-10-22 ENCOUNTER — Telehealth: Payer: Self-pay | Admitting: Family Medicine

## 2015-10-22 ENCOUNTER — Telehealth: Payer: Self-pay | Admitting: Internal Medicine

## 2015-10-22 ENCOUNTER — Other Ambulatory Visit: Payer: Self-pay | Admitting: Internal Medicine

## 2015-10-22 DIAGNOSIS — E8801 Alpha-1-antitrypsin deficiency: Secondary | ICD-10-CM

## 2015-10-22 MED ORDER — PREDNISONE 20 MG PO TABS: 40.0000 mg | ORAL_TABLET | Freq: Every day | ORAL | Status: DC

## 2015-10-22 MED ORDER — TRAMADOL HCL 50 MG PO TABS: 50.0000 mg | ORAL_TABLET | Freq: Three times a day (TID) | ORAL | Status: DC | PRN

## 2015-10-22 NOTE — Telephone Encounter (Signed)
Called and spoke to pt. Pt is requesting prednisone refill. Pt is taking 60mg  pred chronically. Pt states she called her PCP and was advised to call here regarding the refill. Pt states she has been out of the medication for 3 days. This medication was last filled by her PCP office, previous to last fill was filled by Dr. Sherene Sires. Pt c/o increase in SOB because she has been out of pred.   Dr. Sherene Sires please advise if ok to refill. Thanks.

## 2015-10-22 NOTE — Telephone Encounter (Signed)
This dose is way too high and she did not follow instructions:   See Tammy NP w/in 2 weeks with all your medications, even over the counter meds, separated in two separate bags, the ones you take no matter what vs the ones you stop once you feel better and take only as needed when you feel you need them.  Not a med calendar visit.   Best option is 20 mg pills x 2 daily until seen by Tammy but not more than 2 weeks total

## 2015-10-22 NOTE — Telephone Encounter (Signed)
Called and spoke to pt. Informed her of the recs per MW. Appt made with TP on 11.8.16 and rx sent to preferred pharmacy. Pt verbalized understanding and denied any further questions or concerns at this time.

## 2015-10-22 NOTE — Telephone Encounter (Signed)
Refilled tramadol Patient to speak with Dr. Sherene Sires about prednisone, 60 mg daily chronically is a very high dose. I will not refill.

## 2015-10-22 NOTE — Telephone Encounter (Signed)
Patient presents to the clinic stating that she called the pharmacy automated line on Thursday and pharmacy told her today that they sent the request but I do not see the request in the system. Patient has been  Out of prednisone for 3 days now and is hoping to get this filled today. Please follow up with pt. Thank you.   traMADol (ULTRAM) 50 MG tablet predniSONE (DELTASONE) 20 MG tablet

## 2015-10-29 ENCOUNTER — Ambulatory Visit (INDEPENDENT_AMBULATORY_CARE_PROVIDER_SITE_OTHER): Payer: Self-pay | Admitting: Adult Health

## 2015-10-29 ENCOUNTER — Encounter: Payer: Self-pay | Admitting: Adult Health

## 2015-10-29 VITALS — BP 134/88 | HR 102 | Temp 98.5°F | Ht 64.0 in | Wt 263.0 lb

## 2015-10-29 DIAGNOSIS — J45991 Cough variant asthma: Secondary | ICD-10-CM

## 2015-10-29 MED ORDER — PREDNISONE 10 MG PO TABS: ORAL_TABLET | ORAL | Status: DC

## 2015-10-29 MED ORDER — BUDESONIDE-FORMOTEROL FUMARATE 160-4.5 MCG/ACT IN AERO: INHALATION_SPRAY | RESPIRATORY_TRACT | Status: DC

## 2015-10-29 MED ORDER — ALBUTEROL SULFATE HFA 108 (90 BASE) MCG/ACT IN AERS: 2.0000 | INHALATION_SPRAY | Freq: Four times a day (QID) | RESPIRATORY_TRACT | Status: DC | PRN

## 2015-10-29 NOTE — Patient Instructions (Addendum)
Restart Symbicort 2 puffs Twice daily   Patient assistance forms for Symbicort .  Restart Prednisone 40mg  daily for 1 week , then 30mg  daily for 1 week then 20mg  daily and hold at this dose.  Mucinex DM Twice daily  As needed cough/congestion.  Follow up Dr. Sherene Sires  In 4 weeks with PFT  And As needed   Please contact office for sooner follow up if symptoms do not improve or worsen or seek emergency care

## 2015-10-29 NOTE — Progress Notes (Signed)
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  Last admit reviewed from 08/2014 for RUL pna/ not asthma   07/17/2015 1st [facility] office visit/ [facility]   Chief Complaint  Patient presents with  . Pulmonary Consult    Referred by Scot Jun, PA. Pt states that Asthma and AAT def yrs ago. She c/o cough and SOB that have been worse for the past 2 wks. Cough is prod with yellow sputum and is esp worse at night until the point her ribs feel sore. She states she is SOB "all of the time" with or without any exertion.  She uses albuterol neb every 3 hours regularly and albuterol inhaler approx 8 x per day.   presently on 60 mg prednisone which is the lowest she's been on x years plus qvar with severe paroxyms of cough to point of gen rib pain / gagging.  rec We need your last clinic note from [facility] (sign release) andyour last visit with Dr Lorrin Mais  Stop qvar and start symbicort 160 Take 2 puffs first thing in am and then another 2 puffs about 12 hours later.  GERD diet   Pepcid 20 mg one at bedtime  Please schedule a follow up office visit in 2 weeks, sooner if needed    08/01/2015 f/u ov/Wert re: asthma ? 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 Follow-up    Breathing is unchanged since last OV. No new complaints today.    Worse problem is hs severe cough  w/in one hour > better if elevate x 30 degrees  >>Stop Hyzaar, begin bystolic   10/29/2015 Follow up : Asthma Pt presents for a 3 mon follow up .    Pt c/o prod cough with yellow to clear  mucus, chest tightness/congestion, sinus drainage, and SOB with activity since 10/23/15. Denies fever, nausea or vomiting.  On pred for years ran out then back on for last 1 wk  Not taking symbicort because does not have insurance .  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     Current Medications, Allergies, Complete Past Medical History, Past Surgical History, Family History, and Social History were reviewed in [employer] record.  ROS  The following are not active complaints unless bolded sore throat, dysphagia, dental problems, itching, sneezing,  nasal congestion or excess/ purulent secretions, ear ache,   fever, chills, sweats, unintended wt loss, classically pleuritic or exertional cp, hemoptysis,  orthopnea pnd or leg swelling, presyncope, palpitations, abdominal pain, anorexia, nausea, vomiting, diarrhea  or change in bowel or bladder habits, change in stools or urine, dysuria,hematuria,  rash, arthralgias, visual complaints, headache, numbness, weakness or ataxia or problems with walking or  coordination,  change in mood/affect or memory.                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 Nl external ear canals without cough reflex   NECK :  without JVD/Nodes/TM/ nl carotid upstrokes bilaterally   LUNGS: no acc muscle use, clear to A and P    CV:  RRR  no s3 or murmur or increase in P2, no edema   ABD:  Obese , NT, BS +  No bruits or organomegaly, bowel sounds nl  MS:  warm without deformities, calf tenderness, cyanosis or clubbing  SKIN: warm and dry without lesions    NEURO:  alert, approp, no  deficits   CXR PA and Lateral:   08/01/2015 :       IMPRESSION: Chronic bronchitic changes without acute cardiopulmonary disease. Resolution of inferior RIGHT upper lobe pneumonia.  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

## 2015-11-01 MED ORDER — ALBUTEROL SULFATE 108 (90 BASE) MCG/ACT IN AEPB: 2.0000 | INHALATION_SPRAY | Freq: Four times a day (QID) | RESPIRATORY_TRACT | Status: DC | PRN

## 2015-11-03 NOTE — Assessment & Plan Note (Signed)
Need to get steroid dose down if possible  Needs PFT  No insurance will be a major barrier.   Plan  Restart Symbicort 2 puffs Twice daily   Patient assistance forms for Symbicort .  Restart Prednisone 40mg  daily for 1 week , then 30mg  daily for 1 week then 20mg  daily and hold at this dose.  Mucinex DM Twice daily  As needed cough/congestion.  Follow up Dr. Sherene Sires  In 4 weeks with PFT  And As needed   Please contact office for sooner follow up if symptoms do not improve or worsen or seek emergency care

## 2015-11-25 ENCOUNTER — Telehealth: Payer: Self-pay | Admitting: Internal Medicine

## 2015-11-25 DIAGNOSIS — J45991 Cough variant asthma: Secondary | ICD-10-CM

## 2015-11-25 DIAGNOSIS — E8801 Alpha-1-antitrypsin deficiency: Secondary | ICD-10-CM

## 2015-11-25 NOTE — Telephone Encounter (Signed)
LMTCB x 1 

## 2015-11-26 MED ORDER — PREDNISONE 20 MG PO TABS: 20.0000 mg | ORAL_TABLET | Freq: Every day | ORAL | Status: DC

## 2015-11-26 NOTE — Telephone Encounter (Signed)
Spoke with pt, aware of MW's recs.  rx called in to pharmacy.  Nothing further needed.

## 2015-11-26 NOTE — Telephone Encounter (Signed)
That's way too much - 20 mg per day is all she needs for now but ok to hold that dose until next visit  I can see her sooner with all meds in hand if can't make it on the 20 mg dosing

## 2015-11-26 NOTE — Telephone Encounter (Signed)
Pt rescheduled her OV and PFT with Dr Sherene Sires - pt got a new job and needs AM appts only.  PFT is 10am at [facility] on Jan 6th, 2017 with OV following at 11am at [facility] ------ Pt is wanting a refill of her Prednisone. Last continuous dose sent was for Pred 20mg  tabs - Take 40mg  daily.  Last OV with TP 10/29/15 pt was placed on taper which lowered her dose to 20mg  daily. Pt states that this was not enough and she needs higher dose than 20mg  daily. Please advise Dr Sherene Sires. Thanks.  [facility] [employer]

## 2015-12-04 ENCOUNTER — Ambulatory Visit: Payer: Self-pay | Admitting: Internal Medicine

## 2015-12-16 ENCOUNTER — Inpatient Hospital Stay (HOSPITAL_COMMUNITY)
Admission: EM | Admit: 2015-12-16 | Discharge: 2015-12-17 | DRG: 871 | Discharge status: Against Medical Advice | Payer: Self-pay | Attending: Internal Medicine | Admitting: Internal Medicine

## 2015-12-16 ENCOUNTER — Encounter (HOSPITAL_COMMUNITY): Payer: Self-pay | Admitting: Emergency Medicine

## 2015-12-16 ENCOUNTER — Emergency Department (HOSPITAL_COMMUNITY): Payer: Self-pay

## 2015-12-16 DIAGNOSIS — I959 Hypotension, unspecified: Secondary | ICD-10-CM | POA: Diagnosis present

## 2015-12-16 DIAGNOSIS — Z880 Allergy status to penicillin: Secondary | ICD-10-CM

## 2015-12-16 DIAGNOSIS — Z79899 Other long term (current) drug therapy: Secondary | ICD-10-CM

## 2015-12-16 DIAGNOSIS — Z803 Family history of malignant neoplasm of breast: Secondary | ICD-10-CM

## 2015-12-16 DIAGNOSIS — Z7952 Long term (current) use of systemic steroids: Secondary | ICD-10-CM

## 2015-12-16 DIAGNOSIS — Z8249 Family history of ischemic heart disease and other diseases of the circulatory system: Secondary | ICD-10-CM

## 2015-12-16 DIAGNOSIS — E876 Hypokalemia: Secondary | ICD-10-CM

## 2015-12-16 DIAGNOSIS — Z8049 Family history of malignant neoplasm of other genital organs: Secondary | ICD-10-CM

## 2015-12-16 DIAGNOSIS — J189 Pneumonia, unspecified organism: Secondary | ICD-10-CM

## 2015-12-16 DIAGNOSIS — E1110 Type 2 diabetes mellitus with ketoacidosis without coma: Secondary | ICD-10-CM | POA: Diagnosis present

## 2015-12-16 DIAGNOSIS — Z825 Family history of asthma and other chronic lower respiratory diseases: Secondary | ICD-10-CM

## 2015-12-16 DIAGNOSIS — Z7984 Long term (current) use of oral hypoglycemic drugs: Secondary | ICD-10-CM

## 2015-12-16 DIAGNOSIS — Z833 Family history of diabetes mellitus: Secondary | ICD-10-CM

## 2015-12-16 DIAGNOSIS — J45901 Unspecified asthma with (acute) exacerbation: Secondary | ICD-10-CM | POA: Diagnosis present

## 2015-12-16 DIAGNOSIS — N179 Acute kidney failure, unspecified: Secondary | ICD-10-CM

## 2015-12-16 DIAGNOSIS — E785 Hyperlipidemia, unspecified: Secondary | ICD-10-CM

## 2015-12-16 DIAGNOSIS — Z888 Allergy status to other drugs, medicaments and biological substances status: Secondary | ICD-10-CM

## 2015-12-16 DIAGNOSIS — J9601 Acute respiratory failure with hypoxia: Secondary | ICD-10-CM

## 2015-12-16 DIAGNOSIS — E1140 Type 2 diabetes mellitus with diabetic neuropathy, unspecified: Secondary | ICD-10-CM | POA: Diagnosis present

## 2015-12-16 DIAGNOSIS — E119 Type 2 diabetes mellitus without complications: Secondary | ICD-10-CM

## 2015-12-16 DIAGNOSIS — A419 Sepsis, unspecified organism: Principal | ICD-10-CM

## 2015-12-16 DIAGNOSIS — I10 Essential (primary) hypertension: Secondary | ICD-10-CM

## 2015-12-16 DIAGNOSIS — R0602 Shortness of breath: Secondary | ICD-10-CM

## 2015-12-16 DIAGNOSIS — E1169 Type 2 diabetes mellitus with other specified complication: Secondary | ICD-10-CM

## 2015-12-16 DIAGNOSIS — Z794 Long term (current) use of insulin: Secondary | ICD-10-CM | POA: Diagnosis present

## 2015-12-16 DIAGNOSIS — Z87891 Personal history of nicotine dependence: Secondary | ICD-10-CM

## 2015-12-16 DIAGNOSIS — J439 Emphysema, unspecified: Secondary | ICD-10-CM | POA: Diagnosis present

## 2015-12-16 DIAGNOSIS — E872 Acidosis: Secondary | ICD-10-CM | POA: Diagnosis present

## 2015-12-16 LAB — GLUCOSE, CAPILLARY: Glucose-Capillary: 486 mg/dL — ABNORMAL HIGH (ref 65–99)

## 2015-12-16 LAB — URINALYSIS, ROUTINE W REFLEX MICROSCOPIC
Bilirubin Urine: NEGATIVE
Hgb urine dipstick: NEGATIVE
KETONES UR: 15 mg/dL — AB
LEUKOCYTES UA: NEGATIVE
NITRITE: NEGATIVE
PROTEIN: NEGATIVE mg/dL
Specific Gravity, Urine: 1.023 (ref 1.005–1.030)
pH: 6 (ref 5.0–8.0)

## 2015-12-16 LAB — CBC WITH DIFFERENTIAL/PLATELET
BASOS ABS: 0 10*3/uL (ref 0.0–0.1)
BASOS PCT: 0 %
Basophils Absolute: 0 10*3/uL (ref 0.0–0.1)
Basophils Relative: 0 %
EOS ABS: 0 10*3/uL (ref 0.0–0.7)
EOS PCT: 0 %
Eosinophils Absolute: 0 10*3/uL (ref 0.0–0.7)
Eosinophils Relative: 0 %
HEMATOCRIT: 39.1 % (ref 36.0–46.0)
HEMATOCRIT: 33 % — AB (ref 36.0–46.0)
HEMOGLOBIN: 12.7 g/dL (ref 12.0–15.0)
Hemoglobin: 11.2 g/dL — ABNORMAL LOW (ref 12.0–15.0)
LYMPHS ABS: 1.3 10*3/uL (ref 0.7–4.0)
LYMPHS PCT: 13 %
Lymphocytes Relative: 5 %
Lymphs Abs: 0.4 10*3/uL — ABNORMAL LOW (ref 0.7–4.0)
MCH: 29.7 pg (ref 26.0–34.0)
MCH: 30.7 pg (ref 26.0–34.0)
MCHC: 32.5 g/dL (ref 30.0–36.0)
MCHC: 33.9 g/dL (ref 30.0–36.0)
MCV: 91.6 fL (ref 78.0–100.0)
MCV: 90.4 fL (ref 78.0–100.0)
MONO ABS: 0.1 10*3/uL (ref 0.1–1.0)
MONOS PCT: 4 %
Monocytes Absolute: 0.4 10*3/uL (ref 0.1–1.0)
Monocytes Relative: 2 %
NEUTROS ABS: 8.3 10*3/uL — AB (ref 1.7–7.7)
NEUTROS ABS: 7.2 10*3/uL (ref 1.7–7.7)
NEUTROS PCT: 83 %
Neutrophils Relative %: 93 %
PLATELETS: 191 10*3/uL (ref 150–400)
Platelets: 215 10*3/uL (ref 150–400)
RBC: 4.27 MIL/uL (ref 3.87–5.11)
RBC: 3.65 MIL/uL — AB (ref 3.87–5.11)
RDW: 12.9 % (ref 11.5–15.5)
RDW: 12.6 % (ref 11.5–15.5)
WBC: 9.9 10*3/uL (ref 4.0–10.5)
WBC: 7.8 10*3/uL (ref 4.0–10.5)

## 2015-12-16 LAB — COMPREHENSIVE METABOLIC PANEL
ALK PHOS: 38 U/L (ref 38–126)
ALT: 17 U/L (ref 14–54)
ANION GAP: 14 (ref 5–15)
AST: 29 U/L (ref 15–41)
Albumin: 3.2 g/dL — ABNORMAL LOW (ref 3.5–5.0)
BILIRUBIN TOTAL: 0.9 mg/dL (ref 0.3–1.2)
BUN: 10 mg/dL (ref 6–20)
CALCIUM: 8.3 mg/dL — AB (ref 8.9–10.3)
CO2: 31 mmol/L (ref 22–32)
CREATININE: 1.05 mg/dL — AB (ref 0.44–1.00)
Chloride: 90 mmol/L — ABNORMAL LOW (ref 101–111)
GFR calc non Af Amer: >60 mL/min (ref >60)
Glucose, Bld: 239 mg/dL — ABNORMAL HIGH (ref 65–99)
Potassium: 2.9 mmol/L — ABNORMAL LOW (ref 3.5–5.1)
SODIUM: 135 mmol/L (ref 135–145)
TOTAL PROTEIN: 6.5 g/dL (ref 6.5–8.1)

## 2015-12-16 LAB — LACTIC ACID, PLASMA
LACTIC ACID, VENOUS: 1.3 mmol/L (ref 0.5–2.0)
LACTIC ACID, VENOUS: 5.3 mmol/L — AB (ref 0.5–2.0)
LACTIC ACID, VENOUS: 5.6 mmol/L — AB (ref 0.5–2.0)

## 2015-12-16 LAB — I-STAT CG4 LACTIC ACID, ED: Lactic Acid, Venous: 1.82 mmol/L (ref 0.5–2.0)

## 2015-12-16 LAB — URINE MICROSCOPIC-ADD ON: RBC / HPF: NONE SEEN RBC/hpf (ref 0–5)

## 2015-12-16 LAB — PROCALCITONIN

## 2015-12-16 LAB — I-STAT TROPONIN, ED: Troponin i, poc: 0.01 ng/mL (ref 0.00–0.08)

## 2015-12-16 MED ORDER — SIMVASTATIN 40 MG PO TABS
40.0000 mg | ORAL_TABLET | Freq: Every day | ORAL | Status: DC
  Administered 2015-12-16: 40 mg via ORAL
  Filled 2015-12-16 (×2): qty 1

## 2015-12-16 MED ORDER — SODIUM CHLORIDE 0.9 % IV BOLUS (SEPSIS)
1000.0000 mL | INTRAVENOUS | Status: DC
Start: 2015-12-16 — End: 2015-12-16
  Administered 2015-12-16 (×2): 1000 mL via INTRAVENOUS

## 2015-12-16 MED ORDER — GLIPIZIDE 10 MG PO TABS
5.0000 mg | ORAL_TABLET | Freq: Two times a day (BID) | ORAL | Status: DC
  Administered 2015-12-16 – 2015-12-17 (×2): 5 mg via ORAL
  Filled 2015-12-16 (×3): qty 1

## 2015-12-16 MED ORDER — VANCOMYCIN HCL 10 G IV SOLR
2500.0000 mg | Freq: Once | INTRAVENOUS | Status: AC
  Administered 2015-12-16: 2500 mg via INTRAVENOUS
  Filled 2015-12-16: qty 2500

## 2015-12-16 MED ORDER — GABAPENTIN 300 MG PO CAPS
300.0000 mg | ORAL_CAPSULE | Freq: Two times a day (BID) | ORAL | Status: DC
  Administered 2015-12-16 (×2): 300 mg via ORAL
  Filled 2015-12-16 (×2): qty 1

## 2015-12-16 MED ORDER — INSULIN ASPART 100 UNIT/ML ~~LOC~~ SOLN
0.0000 [IU] | Freq: Three times a day (TID) | SUBCUTANEOUS | Status: DC
  Administered 2015-12-17: 5 [IU] via SUBCUTANEOUS

## 2015-12-16 MED ORDER — IPRATROPIUM-ALBUTEROL 0.5-2.5 (3) MG/3ML IN SOLN
3.0000 mL | RESPIRATORY_TRACT | Status: DC | PRN
  Administered 2015-12-17: 3 mL via RESPIRATORY_TRACT
  Filled 2015-12-16: qty 3

## 2015-12-16 MED ORDER — SODIUM CHLORIDE 0.9 % IV SOLN
INTRAVENOUS | Status: AC
  Administered 2015-12-16: 23:00:00 via INTRAVENOUS

## 2015-12-16 MED ORDER — SODIUM CHLORIDE 0.9 % IV SOLN: INTRAVENOUS | Status: DC

## 2015-12-16 MED ORDER — DICLOFENAC SODIUM 1 % TD GEL
1.0000 "application " | Freq: Four times a day (QID) | TRANSDERMAL | Status: DC
  Filled 2015-12-16: qty 100

## 2015-12-16 MED ORDER — ADULT MULTIVITAMIN W/MINERALS CH
1.0000 | ORAL_TABLET | Freq: Every day | ORAL | Status: DC
  Administered 2015-12-16: 1 via ORAL
  Filled 2015-12-16: qty 1

## 2015-12-16 MED ORDER — DEXTROSE 5 % IV SOLN
1.0000 g | Freq: Three times a day (TID) | INTRAVENOUS | Status: DC
  Administered 2015-12-16 – 2015-12-17 (×2): 1 g via INTRAVENOUS
  Filled 2015-12-16 (×4): qty 1

## 2015-12-16 MED ORDER — ACETAMINOPHEN-CODEINE #3 300-30 MG PO TABS
1.0000 | ORAL_TABLET | Freq: Three times a day (TID) | ORAL | Status: DC | PRN
  Administered 2015-12-17: 1 via ORAL
  Filled 2015-12-16: qty 1

## 2015-12-16 MED ORDER — PANTOPRAZOLE SODIUM 40 MG PO TBEC
40.0000 mg | DELAYED_RELEASE_TABLET | Freq: Every day | ORAL | Status: DC
  Administered 2015-12-16: 40 mg via ORAL
  Filled 2015-12-16: qty 1

## 2015-12-16 MED ORDER — ACETAMINOPHEN 325 MG PO TABS
650.0000 mg | ORAL_TABLET | Freq: Once | ORAL | Status: AC
  Administered 2015-12-16: 650 mg via ORAL
  Filled 2015-12-16: qty 2

## 2015-12-16 MED ORDER — VANCOMYCIN HCL IN DEXTROSE 750-5 MG/150ML-% IV SOLN
750.0000 mg | Freq: Two times a day (BID) | INTRAVENOUS | Status: DC
  Administered 2015-12-17: 750 mg via INTRAVENOUS
  Filled 2015-12-16: qty 150

## 2015-12-16 MED ORDER — PREDNISONE 20 MG PO TABS
20.0000 mg | ORAL_TABLET | Freq: Every day | ORAL | Status: DC
  Administered 2015-12-16: 20 mg via ORAL
  Filled 2015-12-16: qty 1

## 2015-12-16 MED ORDER — SODIUM CHLORIDE 0.9 % IV BOLUS (SEPSIS)
2000.0000 mL | Freq: Once | INTRAVENOUS | Status: AC
  Administered 2015-12-16: 2000 mL via INTRAVENOUS

## 2015-12-16 MED ORDER — DEXTROSE 5 % IV SOLN
1.0000 g | Freq: Once | INTRAVENOUS | Status: AC
  Administered 2015-12-16: 1 g via INTRAVENOUS
  Filled 2015-12-16: qty 10

## 2015-12-16 MED ORDER — POTASSIUM CHLORIDE CRYS ER 20 MEQ PO TBCR
40.0000 meq | EXTENDED_RELEASE_TABLET | Freq: Once | ORAL | Status: AC
  Administered 2015-12-16: 40 meq via ORAL
  Filled 2015-12-16: qty 2

## 2015-12-16 MED ORDER — DEXTROSE 5 % IV SOLN
500.0000 mg | Freq: Once | INTRAVENOUS | Status: DC
  Filled 2015-12-16: qty 500

## 2015-12-16 MED ORDER — LEVOFLOXACIN IN D5W 750 MG/150ML IV SOLN: 750.0000 mg | INTRAVENOUS | Status: DC

## 2015-12-16 MED ORDER — INSULIN ASPART 100 UNIT/ML ~~LOC~~ SOLN
12.0000 [IU] | Freq: Once | SUBCUTANEOUS | Status: AC
  Administered 2015-12-17: 12 [IU] via SUBCUTANEOUS

## 2015-12-16 NOTE — H&P (Addendum)
[facility] History and Physical  Carla Hicks:[id number] DOB: [birth year] DOA: 12/16/2015  Referring physician: ER physician: Dr. York Cerise Ward PCP: Minerva Ends, MD  Chief Complaint: shortness of breath   HPI:  44 year old female with past medical history of asthma,  A1A deficiency, diabetes, hypertension, dyslipidemia who presented to [facility] ED with reports of worsening shortness of breath and cough over past few days prior to this admission. She usually uses albuterol inhaler at least every day at least once. She uses Symbicort on scheduled basis twice a day. Because of worsening shortness of breath she was waking up at night and albuterol inhaler did not provide significant symptomatic relief. She also had associated fevers and chills. No chest pain, palpitations. No abdominal pain, nausea or vomiting.   Her last hospitalization was in 08/2014 for pneumonia. No hospitalization in [place] in past 1 year for asthma exacerbation.   In ED, BP was 92/57, HR 116, RR 24, O2 sat 86% on room air, T max 100.5 F. Initial lactic acid WNL. Blood work demonstrated potassium of 2.9, cr 1.05, glucose 239. CXR showed new lingular airspace disease concerning for pneumonia. Sepsis criteria met so she was started on broad spectrum abx.   Assessment & Plan    Principal Problem:   Acute respiratory failure with hypoxemia (HCC) / Acute asthma exacerbation - Likely due to pneumonia. CXR on admission concerning for pneumonia in lingular area - Resp status stable - Started on vanco and cefepime - Continue daily prednisone  - Pneumonia order set placed - Follow up strep pneumonia, legionella, resp culture results - F/U blood culture results - Hemodynamically stable  Active Problems:   Sepsis due to pneumonia ([place]) - Sepsis criteria met on admission with fever of 100.5 F, tachycardia, tachypnea, hypoxia and hypotension, lactic acidosis. Procalcitonin level was WNL - Sepsis work up  initiated - Blood cultures ordered, follow up results - On vanco and cefepime    Essential hypertension, benign - Hold hctz due to hypotension    Controlled type 2 diabetes mellitus with diabetic neuropathy, without long-term current use of insulin (HCC) - On metformin and glipizide - We held metformin due to acute infection - Started SSI - Continue gabapentin for neuropathy     Acute renal failure (ARF) ([place]) - Likely due to sepsis - Continue IV fluids - F/U renal function in am    Hypokalemia - Due to sepsis - Supplemented in ED - F/U BMP in am    Dyslipidemia associated with type 2 diabetes mellitus ([place]) - Continue simvastatin   DVT prophylaxis:  - SCD's bilaterally   Radiological Exams on Admission: Dg Chest 2 View 12/16/2015  1. New lingular airspace disease concerning for pneumonia. 2. Minimal bibasilar atelectasis. Electronically Signed   By: San Morelle M.D.   On: 12/16/2015 13:36    Code Status: Full Family Communication: Plan of care discussed with the patient  Disposition Plan: Admit for further evaluation, telemetry   Leisa Lenz, MD  [facility] Pager [phone]  Time spent in minutes: 75 minutes  Review of Systems:  Constitutional: Negative for fever, chills and malaise/fatigue. Negative for diaphoresis.  HENT: Negative for hearing loss, ear pain, nosebleeds, congestion, sore throat, neck pain, tinnitus and ear discharge.   Eyes: Negative for blurred vision, double vision, photophobia, pain, discharge and redness.  Respiratory: Per HPI Cardiovascular: Negative for chest pain, palpitations, orthopnea, claudication and leg swelling.  Gastrointestinal: Negative for nausea, vomiting and abdominal pain. Negative for heartburn,  constipation, blood in stool and melena.  Genitourinary: Negative for dysuria, urgency, frequency, hematuria and flank pain.  Musculoskeletal: Negative for myalgias, back pain, joint pain and falls.  Skin: Negative for  itching and rash.  Neurological: Negative for dizziness and weakness. Negative for tingling, tremors, sensory change, speech change, focal weakness, loss of consciousness and headaches.  Endo/Heme/Allergies: Negative for environmental allergies and polydipsia. Does not bruise/bleed easily.  Psychiatric/Behavioral: Negative for suicidal ideas. The patient is not nervous/anxious.      Past Medical History  Diagnosis Date  . Diabetes mellitus   . Hypertension   . Urinary bladder disorder   . Asthma     since childhood   . Emphysema Dx 2004   Past Surgical History  Procedure Laterality Date  . Cesarean section     Social History:  reports that she quit smoking about 3 years ago. Her smoking use included Cigarettes. She has a 37.5 pack-year smoking history. She has never used smokeless tobacco. She reports that she does not drink alcohol or use illicit drugs.  Allergies  Allergen Reactions  . [place] [Menthol] Hives  . Penicillins Hives    Has patient had a PCN reaction causing immediate rash, facial/tongue/throat swelling, SOB or lightheadedness with hypotension: yes Has patient had a PCN reaction causing severe rash involving mucus membranes or skin necrosis: no Has patient had a PCN reaction that required hospitalization no Has patient had a PCN reaction occurring within the last 10 years: no If all of the above answers are "NO", then may proceed with Cephalosporin use.     Family History:  Family History  Problem Relation Age of Onset  . Emphysema Mother     smoked  . Allergies Mother   . Asthma Mother   . Breast cancer Mother   . Uterine cancer Mother   . Heart disease Maternal Grandmother   . Diabetes Father      Prior to Admission medications   Medication Sig Start Date End Date Taking? Authorizing Provider  acetaminophen-codeine (TYLENOL #3) 300-30 MG per tablet Take 1 tablet by mouth every 8 (eight) hours as needed for moderate pain. 08/01/15  Yes  Josalyn Funches, MD  albuterol (PROVENTIL) (5 MG/ML) 0.5% nebulizer solution Take 0.5 mLs (2.5 mg total) by nebulization every 4 (four) hours as needed for wheezing or shortness of breath. Sob 04/24/15  Yes Micheline Chapman, NP  Albuterol Sulfate (PROAIR RESPICLICK) 850 (90 BASE) MCG/ACT AEPB Inhale 2 puffs into the lungs every 6 (six) hours as needed. Patient taking differently: Inhale 2 puffs into the lungs every 6 (six) hours as needed (SOB).  11/01/15  Yes Tammy S Parrett, NP  budesonide-formoterol (SYMBICORT) 160-4.5 MCG/ACT inhaler Take 2 puffs first thing in am and then another 2 puffs about 12 hours later. 10/29/15  Yes Tammy S Parrett, NP  diclofenac sodium (VOLTAREN) 1 % GEL Apply 1 application topically 4 (four) times daily. 05/30/15  Yes Janne Napoleon, NP  gabapentin (NEURONTIN) 300 MG capsule Take 1 capsule (300 mg total) by mouth 2 (two) times daily. 09/18/15  Yes Tiffany Daneil Dan, PA-C  glipiZIDE (GLUCOTROL) 5 MG tablet Take 1 tablet (5 mg total) by mouth 2 (two) times daily before a meal. 09/18/15  Yes Tiffany S Noel, PA-C  hydrochlorothiazide (HYDRODIURIL) 25 MG tablet Take 1 tablet (25 mg total) by mouth daily. 09/18/15  Yes Tiffany Daneil Dan, PA-C  metFORMIN (GLUCOPHAGE) 500 MG tablet Take 1 tablet (500 mg total) by mouth 2 (two) times  daily with a meal. 09/18/15  Yes Tiffany Daneil Dan, PA-C  Multiple Vitamin (MULTIVITAMIN WITH MINERALS) TABS tablet Take 1 tablet by mouth daily.   Yes Historical Provider, MD  pantoprazole (PROTONIX) 40 MG tablet Take 1 tablet (40 mg total) by mouth daily. Take 30-60 min before first meal of the day 09/18/15  Yes Tiffany S Noel, PA-C  predniSONE (DELTASONE) 20 MG tablet Take 1 tablet (20 mg total) by mouth daily. 11/26/15  Yes Tanda Rockers, MD  simvastatin (ZOCOR) 40 MG tablet Take 1 tablet (40 mg total) by mouth daily at 6 PM. 09/18/15  Yes Brayton Caves, PA-C   Physical Exam: Filed Vitals:   12/16/15 1151 12/16/15 1200 12/16/15 1321 12/16/15 1421  BP:  108/71  99/63 111/80  Pulse:  116 108 101  Temp: 100.5 F (38.1 C)  100.3 F (37.9 C)   TempSrc: Oral  Oral   Resp:  24 11   SpO2:  93% 92% 98%    Physical Exam  Constitutional: Appears well-developed and well-nourished. No distress.  HENT: Normocephalic. No tonsillar erythema or exudates Eyes: Conjunctivae  are normal. No scleral icterus.  Neck: Normal ROM. Neck supple. No JVD. No tracheal deviation. No thyromegaly.  CVS: tachycardic, S1/S2 +, no murmurs, no gallops, no carotid bruit.  Pulmonary: wheezing in upper lung lobes, no rhonchi  Abdominal: Soft. BS +,  no distension, tenderness, rebound or guarding.  Musculoskeletal: Normal range of motion. No edema and no tenderness.  Lymphadenopathy: No lymphadenopathy noted, cervical, inguinal. Neuro: Alert. Normal reflexes, muscle tone coordination. No focal neurologic deficits. Skin: Skin is warm and dry. No rash noted.  No erythema. No pallor.  Psychiatric: Normal mood and affect. Behavior, judgment, thought content normal.   Labs on Admission:  Basic Metabolic Panel:  Recent Labs Lab 12/16/15 1258  NA 135  K 2.9*  CL 90*  CO2 31  GLUCOSE 239*  BUN 10  CREATININE 1.05*  CALCIUM 8.3*   Liver Function Tests:  Recent Labs Lab 12/16/15 1258  AST 29  ALT 17  ALKPHOS 38  BILITOT 0.9  PROT 6.5  ALBUMIN 3.2*   No results for input(s): LIPASE, AMYLASE in the last 168 hours. No results for input(s): AMMONIA in the last 168 hours. CBC:  Recent Labs Lab 12/16/15 1258  WBC 9.9  NEUTROABS 8.3*  HGB 12.7  HCT 39.1  MCV 91.6  PLT 215   Cardiac Enzymes: No results for input(s): CKTOTAL, CKMB, CKMBINDEX, TROPONINI in the last 168 hours. BNP: Invalid input(s): POCBNP CBG: No results for input(s): GLUCAP in the last 168 hours.  If 7PM-7AM, please contact night-coverage [email or web address] Password [email or web address] 12/16/2015, 2:45 PM

## 2015-12-16 NOTE — Progress Notes (Signed)
ANTIBIOTIC CONSULT NOTE - INITIAL  Pharmacy Consult for Cefepime/Vancomycin Indication: Sepsis  Allergies  Allergen Reactions  . Robitussin Liquid Center [Menthol] Hives  . Penicillins Hives    Has patient had a PCN reaction causing immediate rash, facial/tongue/throat swelling, SOB or lightheadedness with hypotension: yes Has patient had a PCN reaction causing severe rash involving mucus membranes or skin necrosis: no Has patient had a PCN reaction that required hospitalization no Has patient had a PCN reaction occurring within the last 10 years: no If all of the above answers are "NO", then may proceed with Cephalosporin use.     Patient Measurements:   Wt=119 kg  Vital Signs: Temp: 99.1 F (37.3 C) (12/26 1453) Temp Source: Oral (12/26 1453) BP: 111/80 mmHg (12/26 1453) Pulse Rate: 98 (12/26 1453) Intake/Output from previous day:   Intake/Output from this shift:    Labs:  Recent Labs  12/16/15 1258  WBC 9.9  HGB 12.7  PLT 215  CREATININE 1.05*   CrCl cannot be calculated (Unknown ideal weight.). No results for input(s): VANCOTROUGH, VANCOPEAK, VANCORANDOM, GENTTROUGH, GENTPEAK, GENTRANDOM, TOBRATROUGH, TOBRAPEAK, TOBRARND, AMIKACINPEAK, AMIKACINTROU, AMIKACIN in the last 72 hours.   Microbiology: No results found for this or any previous visit (from the past 720 hour(s)).  Medical History: History reviewed. No pertinent past medical history.  Medications:   (Not in a hospital admission) Scheduled:  . diclofenac sodium  1 application Topical QID  . gabapentin  300 mg Oral BID  . glipiZIDE  5 mg Oral BID AC  . multivitamin with minerals  1 tablet Oral Daily  . pantoprazole  40 mg Oral Daily  . predniSONE  20 mg Oral Daily  . simvastatin  40 mg Oral q1800   Infusions:  . ceFEPime (MAXIPIME) IV    . vancomycin     Assessment: 44 yoF c/o SOB and productive cough.  Vancomycin/Cefepime per Rx for Sepsis.   Goal of Therapy:  Vancomycin trough level  15-20 mcg/ml  Plan:   Vancomycin  x1 then  IV q12h  Cefepime 1Gm IV q8h  F/u Scr/cultures/levels as needed  Susanne Greenhouse R 12/16/2015,3:21 PM

## 2015-12-16 NOTE — ED Provider Notes (Signed)
CSN: [id number]     Arrival date & time 12/16/15  1123 History   First MD Initiated Contact with Patient 12/16/15 1157     Chief Complaint  Patient presents with  . Shortness of Breath     (Consider location/radiation/quality/duration/timing/severity/associated sxs/prior Treatment) Patient is a 44 y.o. female presenting with shortness of breath. The history is provided by the patient and medical records. No language interpreter was used.  Shortness of Breath Associated symptoms: cough and wheezing   Associated symptoms: no headaches, no neck pain, no rash, no sore throat and no vomiting    ITZY ADLER is a 44 y.o. female  with a PMH of HTN, DM, asthma, alpha1 antitrypsin deficiency who presents to the Emergency Department complaining of shortness of breath x 1 day. Patient states she had episode of sob last night, EMS came to her home - received breathing treatment and felt much improved, therefore she did not come in. This morning, she had another episode of sob, so she came in. Tried inhalers three times at home with little relief. Received a/a neb tx and solumedrol PTA. Denies chest pain, fevers at home. Admits to productive cough.    History reviewed. No pertinent past medical history. Past Surgical History  Procedure Laterality Date  . Cesarean section     Family History  Problem Relation Age of Onset  . Emphysema Mother     smoked  . Allergies Mother   . Asthma Mother   . Breast cancer Mother   . Uterine cancer Mother   . Heart disease Maternal Grandmother   . Diabetes Father    Social History  Substance Use Topics  . Smoking status: Former Smoker -- 1.50 packs/day for 25 years    Types: Cigarettes    Quit date: 12/22/2011  . Smokeless tobacco: Never Used  . Alcohol Use: No   OB History    Gravida Para Term Preterm AB TAB SAB Ectopic Multiple Living   Review of Systems  Constitutional: Negative.   HENT: Negative for congestion, rhinorrhea  and sore throat.   Eyes: Negative for visual disturbance.  Respiratory: Positive for cough, shortness of breath and wheezing. Negative for stridor.   Cardiovascular: Negative.   Gastrointestinal: Negative for nausea, vomiting, diarrhea and constipation.  Musculoskeletal: Negative for myalgias, back pain, arthralgias and neck pain.  Skin: Negative for rash.  Neurological: Negative for dizziness, weakness and headaches.  Hematological: Does not bruise/bleed easily.      Allergies  Robitussin liquid center and Penicillins  Home Medications   Prior to Admission medications   Medication Sig Start Date End Date Taking? Authorizing Provider  acetaminophen-codeine (TYLENOL #3) 300-30 MG per tablet Take 1 tablet by mouth every 8 (eight) hours as needed for moderate pain. 08/01/15  Yes Josalyn Funches, MD  albuterol (PROVENTIL) (5 MG/ML) 0.5% nebulizer solution Take 0.5 mLs (2.5 mg total) by nebulization every 4 (four) hours as needed for wheezing or shortness of breath. Sob 04/24/15  Yes Henrietta Hoover, NP  Albuterol Sulfate (PROAIR RESPICLICK) 108 (90 BASE) MCG/ACT AEPB Inhale 2 puffs into the lungs every 6 (six) hours as needed. Patient taking differently: Inhale 2 puffs into the lungs every 6 (six) hours as needed (SOB).  11/01/15  Yes Tammy S Parrett, NP  budesonide-formoterol (SYMBICORT) 160-4.5 MCG/ACT inhaler Take 2 puffs first thing in am and then another 2 puffs about 12 hours later. 10/29/15  Yes  Tammy S Parrett, NP  diclofenac sodium (VOLTAREN) 1 % GEL Apply 1 application topically 4 (four) times daily. 05/30/15  Yes Hayden Rasmussen, NP  gabapentin (NEURONTIN) 300 MG capsule Take 1 capsule (300 mg total) by mouth 2 (two) times daily. 09/18/15  Yes Tiffany Netta Cedars, PA-C  glipiZIDE (GLUCOTROL) 5 MG tablet Take 1 tablet (5 mg total) by mouth 2 (two) times daily before a meal. 09/18/15  Yes Tiffany S Noel, PA-C  hydrochlorothiazide (HYDRODIURIL) 25 MG tablet Take 1 tablet (25 mg total) by mouth daily.  09/18/15  Yes Tiffany Netta Cedars, PA-C  metFORMIN (GLUCOPHAGE) 500 MG tablet Take 1 tablet (500 mg total) by mouth 2 (two) times daily with a meal. 09/18/15  Yes Vivianne Master, PA-C  Multiple Vitamin (MULTIVITAMIN WITH MINERALS) TABS tablet Take 1 tablet by mouth daily.   Yes Historical Provider, MD  pantoprazole (PROTONIX) 40 MG tablet Take 1 tablet (40 mg total) by mouth daily. Take 30-60 min before first meal of the day 09/18/15  Yes Tiffany S Noel, PA-C  predniSONE (DELTASONE) 20 MG tablet Take 1 tablet (20 mg total) by mouth daily. 11/26/15  Yes Nyoka Cowden, MD  simvastatin (ZOCOR) 40 MG tablet Take 1 tablet (40 mg total) by mouth daily at 6 PM. 09/18/15  Yes Tiffany Netta Cedars, PA-C  guaiFENesin (MUCINEX) 600 MG 12 hr tablet Take 1 tablet (600 mg total) by mouth 2 (two) times daily. Patient not taking: Reported on 09/18/2015 07/02/15   Vivianne Master, PA-C  guaiFENesin-codeine 100-10 MG/5ML syrup Take 10 mLs by mouth every 4 (four) hours as needed for cough. Patient not taking: Reported on 12/16/2015 08/01/15   Nyoka Cowden, MD  nebivolol (BYSTOLIC) 10 MG tablet Take 1 tablet (10 mg total) by mouth daily. Patient not taking: Reported on 10/29/2015 09/18/15   Vivianne Master, PA-C  potassium chloride SA (K-DUR,KLOR-CON) 20 MEQ tablet Take 1 tablet (20 mEq total) by mouth daily. Patient not taking: Reported on 12/16/2015 05/09/15   Henrietta Hoover, NP  predniSONE (DELTASONE) 10 MG tablet Take 40mg  daily for 1 wk, 3omg daily for 1 wk, then 20mg  daily and hold at this dose Patient not taking: Reported on 12/16/2015 10/29/15   Tammy S Parrett, NP  traMADol (ULTRAM) 50 MG tablet Take 1 tablet (50 mg total) by mouth every 8 (eight) hours as needed. Patient not taking: Reported on 12/16/2015 10/22/15   Dessa Phi, MD  venlafaxine XR (EFFEXOR XR) 75 MG 24 hr capsule Take 1 capsule (75 mg total) by mouth daily with breakfast. Patient not taking: Reported on 12/16/2015 09/18/15   Vivianne Master, PA-C   BP  111/80 mmHg  Pulse 98  Temp(Src) 99.1 F (37.3 C) (Oral)  Resp 15  SpO2 99%  LMP 11/21/2015 Physical Exam  Constitutional: She is oriented to person, place, and time.  WDWN obese female who is alert and in no acute distress  HENT:  Head: Normocephalic and atraumatic.  Cardiovascular: Normal heart sounds and intact distal pulses.  Exam reveals no gallop and no friction rub.   No murmur heard. Tachy but regular  Pulmonary/Chest: Effort normal. No respiratory distress. She has no wheezes. She has no rales. She exhibits no tenderness.  Diminished breath sounds bilaterally.   Abdominal: She exhibits no mass. There is no rebound and no guarding.  Abdomen soft, non-tender, non-distended Bowel sounds positive in all four quadrants  Musculoskeletal: She exhibits no edema.  Neurological: She is alert and oriented to person,  place, and time.  Skin: Skin is warm and dry. No rash noted.  Psychiatric: She has a normal mood and affect. Her behavior is normal. Judgment and thought content normal.  Nursing note and vitals reviewed.   ED Course  Procedures (including critical care time) Labs Review Labs Reviewed  CBC WITH DIFFERENTIAL/PLATELET - Abnormal; Notable for the following:    Neutro Abs 8.3 (*)    All other components within normal limits  COMPREHENSIVE METABOLIC PANEL - Abnormal; Notable for the following:    Potassium 2.9 (*)    Chloride 90 (*)    Glucose, Bld 239 (*)    Creatinine, Ser 1.05 (*)    Calcium 8.3 (*)    Albumin 3.2 (*)    All other components within normal limits  CULTURE, BLOOD (ROUTINE X 2)  CULTURE, BLOOD (ROUTINE X 2)  CULTURE, BLOOD (ROUTINE X 2)  CULTURE, BLOOD (ROUTINE X 2)  CULTURE, EXPECTORATED SPUTUM-ASSESSMENT  GRAM STAIN  LACTIC ACID, PLASMA  LACTIC ACID, PLASMA  PROCALCITONIN  HIV ANTIBODY (ROUTINE TESTING)  STREP PNEUMONIAE URINARY ANTIGEN  LEGIONELLA ANTIGEN, URINE  HEMOGLOBIN A1C  I-STAT CG4 LACTIC ACID, ED  Rosezena Sensor, ED     Imaging Review Dg Chest 2 View  12/16/2015  CLINICAL DATA:  Shortness of breath.  Productive cough. EXAM: CHEST - 2 VIEW COMPARISON:  Two-view chest x-ray 08/01/2015 FINDINGS: New lingular airspace opacity is present. There is mild bibasilar airspace disease. The lungs are otherwise clear. The heart size is exaggerated by low lung volumes. The visualized soft tissues and bony thorax are unremarkable. IMPRESSION: 1. New lingular airspace disease concerning for pneumonia. 2. Minimal bibasilar atelectasis. Electronically Signed   By: Marin Roberts M.D.   On: 12/16/2015 13:36   I have personally reviewed and evaluated these images and lab results as part of my medical decision-making.   EKG Interpretation   Date/Time:  Monday December 16 2015 13:19:00 EST Ventricular Rate:  109 PR Interval:  139 QRS Duration: 141 QT Interval:  377 QTC Calculation: 508 R Axis:   -67 Text Interpretation:  Sinus tachycardia RBBB and LAFB Baseline wander in  lead(s) III Confirmed by COOK  MD, BRIAN ([id number]) on 12/16/2015 2:34:51 PM      MDM   Final diagnoses:  Shortness of breath  Community acquired pneumonia  Hypokalemia   [name] presents with shortness of breath x 2 days. Pt. Is ill-appearing, hypotensive, and o2 sats decreased to low 80's without [place] - Patient is not on O2 at home.   Labs: Lactic, troponin, CBC, CMP; blood cultures x2 ordered Imaging: CXR shows new lingular airspace disease c/w PNA.   Therapeutics: Potassium for hypokalemia, Tylenol for fever; IV Fluids, IV ABX  A&P: Pneumonia  - Pt. Started on Rocephin and Zithromax in ED  - Consult to hospitalist who will admit for further evaluation and management  Patient seen by and discussed with Dr. Adriana Simas who agrees with treatment plan.    [facility] Maritssa Haughton, PA-C 12/16/15 1524  Donnetta Hutching, MD 12/18/15 [phone]

## 2015-12-16 NOTE — ED Notes (Signed)
Per EMS: Pt states that she has been having SOB and productive cough since yesterday.  Inhaler and nebs at home due to asthma.  99.1 temporal temp.

## 2015-12-16 NOTE — ED Notes (Signed)
5 mg albuterol, 0.5 atrovent, 125 solumedrol given.  20 g lt arm.

## 2015-12-17 LAB — COMPREHENSIVE METABOLIC PANEL
ALK PHOS: 39 U/L (ref 38–126)
ALT: 21 U/L (ref 14–54)
ANION GAP: 13 (ref 5–15)
AST: 32 U/L (ref 15–41)
Albumin: 2.8 g/dL — ABNORMAL LOW (ref 3.5–5.0)
BUN: 14 mg/dL (ref 6–20)
CALCIUM: 7.4 mg/dL — AB (ref 8.9–10.3)
CO2: 22 mmol/L (ref 22–32)
CREATININE: 1 mg/dL (ref 0.44–1.00)
Chloride: 100 mmol/L — ABNORMAL LOW (ref 101–111)
Glucose, Bld: 423 mg/dL — ABNORMAL HIGH (ref 65–99)
Potassium: 3.5 mmol/L (ref 3.5–5.1)
SODIUM: 135 mmol/L (ref 135–145)
Total Bilirubin: 0.6 mg/dL (ref 0.3–1.2)
Total Protein: 6.1 g/dL — ABNORMAL LOW (ref 6.5–8.1)

## 2015-12-17 LAB — CBC
HEMATOCRIT: 35.6 % — AB (ref 36.0–46.0)
Hemoglobin: 12.1 g/dL (ref 12.0–15.0)
MCH: 30.3 pg (ref 26.0–34.0)
MCHC: 34 g/dL (ref 30.0–36.0)
MCV: 89.2 fL (ref 78.0–100.0)
PLATELETS: 216 10*3/uL (ref 150–400)
RBC: 3.99 MIL/uL (ref 3.87–5.11)
RDW: 12.4 % (ref 11.5–15.5)
WBC: 7 10*3/uL (ref 4.0–10.5)

## 2015-12-17 LAB — GLUCOSE, CAPILLARY
GLUCOSE-CAPILLARY: 462 mg/dL — AB (ref 65–99)
GLUCOSE-CAPILLARY: 311 mg/dL — AB (ref 65–99)
GLUCOSE-CAPILLARY: 265 mg/dL — AB (ref 65–99)

## 2015-12-17 LAB — HEMOGLOBIN A1C
Hgb A1c MFr Bld: 7 % — ABNORMAL HIGH (ref 4.8–5.6)
MEAN PLASMA GLUCOSE: 154 mg/dL

## 2015-12-17 LAB — LACTIC ACID, PLASMA: LACTIC ACID, VENOUS: 5.4 mmol/L — AB (ref 0.5–2.0)

## 2015-12-17 LAB — HIV ANTIBODY (ROUTINE TESTING W REFLEX): HIV Screen 4th Generation wRfx: NONREACTIVE

## 2015-12-17 LAB — STREP PNEUMONIAE URINARY ANTIGEN: Strep Pneumo Urinary Antigen: NEGATIVE

## 2015-12-17 MED ORDER — SODIUM CHLORIDE 0.9 % IV BOLUS (SEPSIS): 1000.0000 mL | Freq: Once | INTRAVENOUS | Status: DC

## 2015-12-17 MED ORDER — SODIUM CHLORIDE 0.9 % IV BOLUS (SEPSIS)
500.0000 mL | Freq: Once | INTRAVENOUS | Status: AC
  Administered 2015-12-17: 500 mL via INTRAVENOUS

## 2015-12-17 MED ORDER — INSULIN ASPART 100 UNIT/ML ~~LOC~~ SOLN
12.0000 [IU] | Freq: Once | SUBCUTANEOUS | Status: AC
  Administered 2015-12-17: 12 [IU] via SUBCUTANEOUS

## 2015-12-17 MED ORDER — LEVOFLOXACIN 750 MG PO TABS: 750.0000 mg | ORAL_TABLET | Freq: Every day | ORAL | Status: DC

## 2015-12-17 MED ORDER — SODIUM CHLORIDE 0.9 % IV BOLUS (SEPSIS)
1000.0000 mL | Freq: Once | INTRAVENOUS | Status: AC
  Administered 2015-12-17: 1000 mL via INTRAVENOUS

## 2015-12-17 NOTE — Progress Notes (Signed)
Inpatient Diabetes Program Recommendations  AACE/ADA: New Consensus Statement on Inpatient Glycemic Control (2015)  Target Ranges:  Prepandial:   less than 140 mg/dL      Peak postprandial:   less than 180 mg/dL (1-2 hours)      Critically ill patients:  140 - 180 mg/dL    Results for ORELIA, BRANDSTETTER (MRN [id number]) as of 12/17/2015 09:19  Ref. Range 12/16/2015 23:17 12/17/2015 01:27 12/17/2015 04:01 12/17/2015 07:50  Glucose-Capillary Latest Ref Range: 65-99 mg/dL 914 (H) 782 (H) 956 (H) 265 (H)    Results for RAILEE, BONILLAS (MRN [id number]) as of 12/17/2015 09:19  Ref. Range 12/16/2015 15:21  Hemoglobin A1C Latest Ref Range: 4.8-5.6 % 7.0 (H)     Admit with: SOB  History: DM, Asthma  Home DM Meds: Glipizide 5 mg bid       Metformin 500 mg bid  Current Insulin Orders: Novolog Sensitive SSI (0-9 units) TID AC      Glipizide 5 mg bid      -Patient currently getting Prednisone 20 mg daily.  -Glucose levels elevated >400 mg/dl on admission.  Did not see that patient received any IV steroids in the ED.  CBGs steadily dropping now that Novolog SSI started.  -Patient has good glucose control at home as evidenced by A1c of 7%.     --Will follow patient during hospitalization--  Ambrose Finland RN, MSN, CDE Diabetes Coordinator Inpatient Glycemic Control Team Team Pager: [phone] (8a-5p)

## 2015-12-18 ENCOUNTER — Telehealth: Payer: Self-pay | Admitting: *Deleted

## 2015-12-18 LAB — LEGIONELLA ANTIGEN, URINE

## 2015-12-18 NOTE — Telephone Encounter (Signed)
Date of birth verified by pt  Negative urine results given Pt verbalized understanding

## 2015-12-18 NOTE — Telephone Encounter (Signed)
-----   Message from Dessa Phi, MD sent at 12/18/2015  1:10 PM EST ----- Negative urine legionella

## 2015-12-19 NOTE — Progress Notes (Signed)
Patient went home AMA, Dr Elisabeth Pigeon wanted to keep her for another day of IV antibiotics but she said she really have to go home because she has small children at home. Dr Elisabeth Pigeon  Explained the risks and gave her some medical advice.She agrees to come to ED if she feels sicker again.

## 2015-12-21 LAB — CULTURE, BLOOD (ROUTINE X 2)
CULTURE: NO GROWTH
Culture: NO GROWTH

## 2015-12-26 NOTE — Discharge Summary (Signed)
Physician Discharge Summary  Carla Hicks DHR:[id number] DOB: Dec 12, 1971 DOA: 12/16/2015  PCP: Minerva Ends, MD  Admit date: 12/16/2015 Discharge date: 12/26/2015  Recommendations for Outpatient Follow-up:  1. Pt left AMA 2. Pt was given prescription for Levaquin   Discharge Diagnoses:  Principal Problem:   Acute respiratory failure with hypoxemia ([place]) Active Problems:   Sepsis due to pneumonia [facility])   Essential hypertension, benign   Controlled type 2 diabetes mellitus without complication, without long-term current use of insulin (HCC)   Acute renal failure (ARF) (HCC)   Hypokalemia   Dyslipidemia associated with type 2 diabetes mellitus ([place])    Discharge Condition: stable   Diet recommendation: as tolerated   History of present illness:  PER HPI "45 year old female with past medical history of asthma, A1A deficiency, diabetes, hypertension, dyslipidemia who presented to [facility] ED with reports of worsening shortness of breath and cough over past few days prior to this admission. She usually uses albuterol inhaler at least every day at least once. She uses Symbicort on scheduled basis twice a day. Because of worsening shortness of breath she was waking up at night and albuterol inhaler did not provide significant symptomatic relief. She also had associated fevers and chills. No chest pain, palpitations. No abdominal pain, nausea or vomiting.    In ED, BP was 92/57, HR 116, RR 24, O2 sat 86% on room air, T max 100.5 F. Initial lactic acid WNL. Blood work demonstrated potassium of 2.9, cr 1.05, glucose 239. CXR showed new lingular airspace disease concerning for pneumonia. Sepsis criteria met so she was started on broad spectrum abx. "  Hospital Course:    Assessment & Plan    Principal Problem:  Acute respiratory failure with hypoxemia (HCC) / Acute asthma exacerbation - Likely due to pneumonia. CXR on admission concerning for pneumonia in lingular area -  Started on vanco and cefepime; Levaquin on discharge - Continue daily prednisone  - Follow up strep pneumonia, legionella, resp culture and blood culture results on outpt basis    Active Problems:  Sepsis due to pneumonia ([place]) - Sepsis criteria met on admission with fever of 100.5 F, tachycardia, tachypnea, hypoxia and hypotension, lactic acidosis. Procalcitonin level was WNL - Pneumonia presumed source of infection - Levaquin on discharge as mentioned    Essential hypertension, benign - Held hctz due to hypotension   Controlled type 2 diabetes mellitus with diabetic neuropathy, without long-term current use of insulin (HCC) - On metformin and glipizide - We held metformin due to acute infection - May resume on discharge  - Continue gabapentin for neuropathy    Acute renal failure (ARF) ([place]) - Likely due to sepsis - Given IV fluids - She left AMA so we could not recheck Cr    Hypokalemia - Due to sepsis - Supplemented in ED   Dyslipidemia associated with type 2 diabetes mellitus ([place]) - Continue simvastatin   DVT prophylaxis:  - SCD's bilaterally in hospital    Signed:  Leisa Lenz, MD  [facility] 12/26/2015, 10:23 PM  Pager #: [phone]  Time spent in minutes: more than 30 minutes    Discharge Exam: Filed Vitals:   12/16/15 2037 12/17/15 0500  BP: 92/57 117/74  Pulse: 96 93  Temp: 97.9 F (36.6 C) 97.5 F (36.4 C)  Resp: 20 20   Filed Vitals:   12/16/15 1658 12/16/15 2037 12/17/15 0130 12/17/15 0500  BP:  92/57  117/74  Pulse:  96  93  Temp:  97.9 F (36.6 C)  97.5 F (36.4 C)  TempSrc:  Oral  Oral  Resp:  20  20  Height: 5' 4"  (1.626 m)     Weight: 256 lb 2.8 oz (116.2 kg)     SpO2:  94% 94% 98%    General: Pt is alert, follows commands appropriately, not in acute distress Cardiovascular: Regular rate and rhythm, S1/S2 +, no murmurs Respiratory: Clear to auscultation bilaterally, no wheezing, no crackles, no  rhonchi Abdominal: Soft, non tender, non distended, bowel sounds +, no guarding Extremities: no edema, no cyanosis, pulses palpable bilaterally DP and PT Neuro: Grossly nonfocal  Discharge Instructions     Medication List    TAKE these medications        acetaminophen-codeine 300-30 MG tablet  Commonly known as:  TYLENOL #3  Take 1 tablet by mouth every 8 (eight) hours as needed for moderate pain.     albuterol (5 MG/ML) 0.5% nebulizer solution  Commonly known as:  PROVENTIL  Take 0.5 mLs (2.5 mg total) by nebulization every 4 (four) hours as needed for wheezing or shortness of breath. Sob     budesonide-formoterol 160-4.5 MCG/ACT inhaler  Commonly known as:  SYMBICORT  Take 2 puffs first thing in am and then another 2 puffs about 12 hours later.     diclofenac sodium 1 % Gel  Commonly known as:  VOLTAREN  Apply 1 application topically 4 (four) times daily.     gabapentin 300 MG capsule  Commonly known as:  NEURONTIN  Take 1 capsule (300 mg total) by mouth 2 (two) times daily.     glipiZIDE 5 MG tablet  Commonly known as:  GLUCOTROL  Take 1 tablet (5 mg total) by mouth 2 (two) times daily before a meal.     guaiFENesin 600 MG 12 hr tablet  Commonly known as:  MUCINEX  Take 1 tablet (600 mg total) by mouth 2 (two) times daily.     guaiFENesin-codeine 100-10 MG/5ML syrup  Take 10 mLs by mouth every 4 (four) hours as needed for cough.     hydrochlorothiazide 25 MG tablet  Commonly known as:  HYDRODIURIL  Take 1 tablet (25 mg total) by mouth daily.     levofloxacin 750 MG tablet  Commonly known as:  LEVAQUIN  Take 1 tablet (750 mg total) by mouth daily.     metFORMIN 500 MG tablet  Commonly known as:  GLUCOPHAGE  Take 1 tablet (500 mg total) by mouth 2 (two) times daily with a meal.     multivitamin with minerals Tabs tablet  Take 1 tablet by mouth daily.     nebivolol 10 MG tablet  Commonly known as:  BYSTOLIC  Take 1 tablet (10 mg total) by mouth daily.      pantoprazole 40 MG tablet  Commonly known as:  PROTONIX  Take 1 tablet (40 mg total) by mouth daily. Take 30-60 min before first meal of the day     potassium chloride SA 20 MEQ tablet  Commonly known as:  K-DUR,KLOR-CON  Take 1 tablet (20 mEq total) by mouth daily.     predniSONE 10 MG tablet  Commonly known as:  DELTASONE  Take 48m daily for 1 wk, 3omg daily for 1 wk, then 288mdaily and hold at this dose     predniSONE 20 MG tablet  Commonly known as:  DELTASONE  Take 1 tablet (20 mg total) by mouth daily.     simvastatin 40 MG tablet  Commonly known as:  ZOCOR  Take 1 tablet (40 mg total) by mouth daily at 6 PM.     traMADol 50 MG tablet  Commonly known as:  ULTRAM  Take 1 tablet (50 mg total) by mouth every 8 (eight) hours as needed.     venlafaxine XR 75 MG 24 hr capsule  Commonly known as:  EFFEXOR XR  Take 1 capsule (75 mg total) by mouth daily with breakfast.          The results of significant diagnostics from this hospitalization (including imaging, microbiology, ancillary and laboratory) are listed below for reference.    Significant Diagnostic Studies: Dg Chest 2 View  12/16/2015  CLINICAL DATA:  Shortness of breath.  Productive cough. EXAM: CHEST - 2 VIEW COMPARISON:  Two-view chest x-ray 08/01/2015 FINDINGS: New lingular airspace opacity is present. There is mild bibasilar airspace disease. The lungs are otherwise clear. The heart size is exaggerated by low lung volumes. The visualized soft tissues and bony thorax are unremarkable. IMPRESSION: 1. New lingular airspace disease concerning for pneumonia. 2. Minimal bibasilar atelectasis. Electronically Signed   By: San Morelle M.D.   On: 12/16/2015 13:36    Microbiology: No results found for this or any previous visit (from the past 240 hour(s)).   Labs: Basic Metabolic Panel: No results for input(s): NA, K, CL, CO2, GLUCOSE, BUN, CREATININE, CALCIUM, MG, PHOS in the last 168 hours. Liver  Function Tests: No results for input(s): AST, ALT, ALKPHOS, BILITOT, PROT, ALBUMIN in the last 168 hours. No results for input(s): LIPASE, AMYLASE in the last 168 hours. No results for input(s): AMMONIA in the last 168 hours. CBC: No results for input(s): WBC, NEUTROABS, HGB, HCT, MCV, PLT in the last 168 hours. Cardiac Enzymes: No results for input(s): CKTOTAL, CKMB, CKMBINDEX, TROPONINI in the last 168 hours. BNP: BNP (last 3 results) No results for input(s): BNP in the last 8760 hours.  ProBNP (last 3 results) No results for input(s): PROBNP in the last 8760 hours.  CBG: No results for input(s): GLUCAP in the last 168 hours.

## 2015-12-27 ENCOUNTER — Ambulatory Visit (INDEPENDENT_AMBULATORY_CARE_PROVIDER_SITE_OTHER)
Admission: RE | Admit: 2015-12-27 | Discharge: 2015-12-27 | Disposition: A | Discharge status: Home / Self Care | Payer: Self-pay | Source: Ambulatory Visit | Attending: Internal Medicine | Admitting: Internal Medicine

## 2015-12-27 ENCOUNTER — Encounter: Payer: Self-pay | Admitting: Internal Medicine

## 2015-12-27 ENCOUNTER — Ambulatory Visit (INDEPENDENT_AMBULATORY_CARE_PROVIDER_SITE_OTHER): Payer: Self-pay | Admitting: Internal Medicine

## 2015-12-27 ENCOUNTER — Encounter (HOSPITAL_COMMUNITY): Payer: Self-pay

## 2015-12-27 VITALS — BP 122/76 | HR 104 | Temp 98.0°F | Ht 64.0 in | Wt 257.8 lb

## 2015-12-27 DIAGNOSIS — G894 Chronic pain syndrome: Secondary | ICD-10-CM

## 2015-12-27 DIAGNOSIS — E8801 Alpha-1-antitrypsin deficiency: Secondary | ICD-10-CM

## 2015-12-27 DIAGNOSIS — J181 Lobar pneumonia, unspecified organism: Secondary | ICD-10-CM | POA: Insufficient documentation

## 2015-12-27 DIAGNOSIS — J45991 Cough variant asthma: Secondary | ICD-10-CM

## 2015-12-27 MED ORDER — BUDESONIDE-FORMOTEROL FUMARATE 80-4.5 MCG/ACT IN AERO: INHALATION_SPRAY | RESPIRATORY_TRACT | Status: DC

## 2015-12-27 MED ORDER — FAMOTIDINE 20 MG PO TABS: ORAL_TABLET | ORAL | Status: DC

## 2015-12-27 MED ORDER — SILVER SULFADIAZINE 1 % EX CREA: 1.0000 "application " | TOPICAL_CREAM | Freq: Two times a day (BID) | CUTANEOUS | Status: DC

## 2015-12-27 MED ORDER — FLUTTER DEVI: Status: DC

## 2015-12-27 MED ORDER — ACETAMINOPHEN-CODEINE #3 300-30 MG PO TABS: 1.0000 | ORAL_TABLET | Freq: Three times a day (TID) | ORAL | Status: DC | PRN

## 2015-12-27 MED ORDER — PREDNISONE 20 MG PO TABS: 20.0000 mg | ORAL_TABLET | Freq: Every day | ORAL | Status: DC

## 2015-12-27 MED FILL — predniSONE 20 MG TABS: 20 | 30 days supply | Qty: 30 | Fill #0

## 2015-12-27 MED FILL — SSD 1% CREAM: 1 | 15 days supply | Qty: 50 | Fill #0

## 2015-12-27 MED FILL — ?FAMOTIDINE 20 MG TABLET: 20 | 30 days supply | Qty: 30 | Fill #0

## 2015-12-27 MED FILL — HYDROCHLOROTHIAZIDE 25 MG T: 25 | 30 days supply | Qty: 30 | Fill #3

## 2015-12-27 MED FILL — ACETAMINOPHEN/COD #3 TABLET: 300-30 | 20 days supply | Qty: 60 | Fill #0

## 2015-12-27 NOTE — Progress Notes (Signed)
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  Last admit reviewed from 08/2014 for RUL pna/ not asthma   07/17/2015 1st [facility] office visit/ Carla Hicks   Chief Complaint  Patient presents with  . Pulmonary Consult    Referred by Zettie Pho, PA. Pt states that Asthma and AAT def yrs ago. She c/o cough and SOB that have been worse for the past 2 wks. Cough is prod with yellow sputum and is esp worse at night until the point her ribs feel sore. She states she is SOB "all of the time" with or without any exertion.  She uses albuterol neb every 3 hours regularly and albuterol inhaler approx 8 x per day.   presently on 60 mg prednisone which is the lowest she's been on x years plus qvar with severe paroxyms of cough to point of gen rib pain / gagging.  rec We need your last clinic note from [facility] (sign release) and your last visit with Dr Charleston Poot  Stop qvar and start symbicort 160 Take 2 puffs first thing in am and then another 2 puffs about 12 hours later.  GERD diet   Pepcid 20 mg one at bedtime  Please schedule a follow up office visit in 2 weeks, sooner if needed    08/01/2015 f/u ov/Carla Hicks re: asthma ? 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 Follow-up    Breathing is unchanged since last OV. No new  complaints today.   Worse problem is hs severe cough w/in one hour > better if elevate x 30 degrees  >>Stop Hyzaar, begin bystolic    75/12/6999 Follow up : Asthma Pt presents for a 3 mon follow up .    Pt c/o prod cough with yellow to clear  mucus, chest tightness/congestion, sinus drainage, and SOB with activity since 10/23/15. Denies fever, nausea or vomiting.  On pred for years ran out then back on for last 1 wk  Not taking symbicort because does not have insurance  rec Restart Symbicort 2 puffs Twice daily   Patient assistance forms for Symbicort .  Restart Prednisone 95m daily for 1 week , then 362mdaily for 1 week then 2067maily and hold at this dose.  Mucinex DM Twice daily  As needed cough/congestion.  Follow up Dr. WerMelvyn Novasn 4 weeks with PFT  And As needed     Admit date: 12/16/2015 Discharge date: 12/26/2015  Recommendations for Outpatient Follow-up:  1. Pt left AMA 2. Pt was given prescription for Levaquin  Discharge Diagnoses:  Principal Problem:  Acute respiratory failure with hypoxemia (HCCLavoniactive Problems:  Sepsis due to pneumonia ([facility] hypertension, benign  Controlled type 2 diabetes mellitus  without complication, without long-term current use of insulin (HCC)  Acute renal failure (ARF) (HCC)  Hypokalemia  Dyslipidemia associated with type 2 diabetes mellitus ([place])    Discharge Condition: stable   Diet recommendation: as tolerated   History of present illness:  PER HPI "44 year old female with past medical history of asthma, A1A deficiency, diabetes, hypertension, dyslipidemia who presented to [facility] ED with reports of worsening shortness of breath and cough over past few days prior to this admission. She usually uses albuterol inhaler at least every day at least once. She uses Symbicort on scheduled basis twice a day. Because of worsening shortness of breath she was waking up at night and albuterol inhaler did not provide significant  symptomatic relief. She also had associated fevers and chills. No chest pain, palpitations. No abdominal pain, nausea or vomiting.    In ED, BP was 92/57, HR 116, RR 24, O2 sat 86% on room air, T max 100.5 F. Initial lactic acid WNL. Blood work demonstrated potassium of 2.9, cr 1.05, glucose 239. CXR showed new lingular airspace disease concerning for pneumonia. Sepsis criteria met so she was started on broad spectrum abx. "  Hospital Course:    Assessment & Plan    Principal Problem:  Acute respiratory failure with hypoxemia (HCC) / Acute asthma exacerbation - Likely due to pneumonia. CXR on admission concerning for pneumonia in lingular area - Started on vanco and cefepime; Levaquin on discharge - Continue daily prednisone  - Follow up strep pneumonia, legionella, resp culture and blood culture results on outpt basis    Active Problems:  Sepsis due to pneumonia ([place]) - Sepsis criteria met on admission with fever of 100.5 F, tachycardia, tachypnea, hypoxia and hypotension, lactic acidosis. Procalcitonin level was WNL - Pneumonia presumed source of infection - Levaquin on discharge as mentioned    Essential hypertension, benign - Held hctz due to hypotension   Controlled type 2 diabetes mellitus with diabetic neuropathy, without long-term current use of insulin (HCC) - On metformin and glipizide - We held metformin due to acute infection - May resume on discharge  - Continue gabapentin for neuropathy    Acute renal failure (ARF) ([place]) - Likely due to sepsis - Given IV fluids - She left AMA so we could not recheck Cr    Hypokalemia - Due to sepsis - Supplemented in ED   Dyslipidemia associated with type 2 diabetes mellitus ([place]) - Continue simvastatin   DVT prophylaxis:  - SCD's bilaterally in hospital         12/27/2015  f/u ov/Carla Hicks re: ext post hosp f/u / transition of care / floor for pred is 20 mg daily  Chief Complaint  Patient presents with  .  Follow-up    Pt admitted to [facility] on 12/16/15 for pneumonia - Left AMA due to childcare issues - Still taking Levaquin 750 daily but pt wants to change due to yeast infection symptoms - c/o sob, prod cough (yellow), wheezing and chest tightness, increase use of Albuterol  c/o pain R flank with coughing only/ comfortable breathing at rest if not coughing  No obvious day to day or daytime variability or assoc overt sinus or hb symptoms. No unusual exp hx or h/o childhood pna/ asthma or knowledge of premature birth.  Sleeping ok without nocturnal  or early am exacerbation  of respiratory  c/o's or need for noct saba. Also denies any obvious fluctuation of symptoms with weather or environmental changes or other aggravating or alleviating factors  except as outlined above   Current Medications, Allergies, Complete Past Medical History, Past Surgical History, Family History, and Social History were reviewed in [employer] record.  ROS  The following are not active complaints unless bolded sore throat, dysphagia, dental problems, itching, sneezing,  nasal congestion or excess/ purulent secretions, ear ache,   fever, chills, sweats, unintended wt loss, classically pleuritic or exertional cp, hemoptysis,  orthopnea pnd or leg swelling, presyncope, palpitations, abdominal pain, anorexia, nausea, vomiting, diarrhea  or change in bowel or bladder habits, change in stools or urine, dysuria,hematuria,  rash, arthralgias, visual complaints, headache, numbness, weakness or ataxia or problems with walking or coordination,  change in mood/affect or memory.                     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 Nl external ear canals  without cough reflex   NECK :  without JVD/Nodes/TM/ nl carotid upstrokes bilaterally   LUNGS: no acc muscle use, clear to A and P    CV:  RRR  no s3 or murmur or increase in P2, no edema   ABD:  Obese , NT, BS +  No bruits or organomegaly, bowel sounds nl  MS:  warm without deformities, calf tenderness, cyanosis or clubbing  SKIN: warm and dry with 2nd degree burn L thumb dorsal surface   NEURO:  alert, approp, no deficits   CXR PA and Lateral:   12/27/2015 :    I personally reviewed images and agree with radiology impression as follows:    Resolution of the previously seen lingular pneumonia. No acute findings.      Assessment & Plan:

## 2015-12-27 NOTE — Patient Instructions (Addendum)
Silvadene cream twice daily to burn on thumb  Prednisone 20 mg daily with bfast until better then one half daily until seen  symbicort 80 Take 2 puffs first thing in am and then another 2 puffs about 12 hours later.    Work on inhaler technique:  relax and gently blow all the way out then take a nice smooth deep breath back in, triggering the inhaler at same time you start breathing in.  Hold for up to 5 seconds if you can. Blow out thru nose. Rinse and gargle with water when done   For cough >  Tylenol #3 one every 4 hours if needed and use your flutter valve  For short of breath > neb albuterol up to every 4 hours as needed  Add pepcid ac 20 mg at bedtime   GERD (REFLUX)  is an extremely common cause of respiratory symptoms just like yours , many times with no obvious heartburn at all.    It can be treated with medication, but also with lifestyle changes including elevation of the head of your bed (ideally with 6 inch  bed blocks),  Smoking cessation, avoidance of late meals, excessive alcohol, and avoid fatty foods, chocolate, peppermint, colas, red wine, and acidic juices such as orange juice.  NO MINT OR MENTHOL PRODUCTS SO NO COUGH DROPS  USE SUGARLESS CANDY INSTEAD (Jolley ranchers or Stover's or Life Savers) or even ice chips will also do - the key is to swallow to prevent all throat clearing. NO OIL BASED VITAMINS - use powdered substitutes.    Please remember to go to the  x-ray department downstairs for your tests - we will call you with the results when they are available.  See Tammy NP in 4 weeks with all your medications, even over the counter meds, separated in two separate bags, the ones you take no matter what vs the ones you stop once you feel better and take only as needed when you feel you need them.   Tammy  will generate for you a new user friendly medication calendar that will put [place] all on the same page re: your medication use.     Without this process, it simply  isn't possible to assure that we are providing  your outpatient care  with  the attention to detail we feel you deserve.   If we cannot assure that you're getting that kind of care,  then we cannot manage your problem effectively from this clinic.  Once you have seen Tammy and we are sure that we're all on the same page with your medication use she will arrange follow up with me.

## 2015-12-28 ENCOUNTER — Encounter: Payer: Self-pay | Admitting: Internal Medicine

## 2015-12-28 NOTE — Assessment & Plan Note (Signed)
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

## 2015-12-28 NOTE — Assessment & Plan Note (Signed)
07/17/2015 try off qvar and on symbicort 160 2bid  - 12/27/2015  extensive coaching HFA effectiveness =    75% > try reducing symbicort to 80 2bid  i very strongly doubt this is steroid dep asthma and suspect more likely she has uacs but difficult to sort out as has not yet return for pft's as rec and need to do so then consider MCT if pfts nl as suspected  Regardless of what we call it, it is clearly difficult to control. DDX of  difficult airways management almost all start with A and  include Adherence, Ace Inhibitors, Acid Reflux, Active Sinus Disease, Alpha 1 Antitripsin deficiency, Anxiety masquerading as Airways dz,  ABPA,  Allergy(esp in young), Aspiration (esp in elderly), Adverse effects of meds,  Active smokers, A bunch of PE's (a small clot burden can't cause this syndrome unless there is already severe underlying pulm or vascular dz with poor reserve) plus two Bs  = Bronchiectasis and Beta blocker use..and one C= CHF   Adherence is always the initial "prime suspect" and is a multilayered concern that requires a "trust but verify" approach in every patient - starting with knowing how to use medications, especially inhalers, correctly, keeping up with refills and understanding the fundamental difference between maintenance and prns vs those medications only taken for a very short course and then stopped and not refilled.  - - The proper method of use, as well as anticipated side effects, of a metered-dose inhaler are discussed and demonstrated to the patient. Improved effectiveness after extensive coaching during this visit to a level of approximately 75 % from a baseline of 50 %  > reduce the symbicort to 80 2bid for now  ? Acid (or non-acid) GERD > always difficult to exclude as up to 75% of pts in some series report no assoc GI/ Heartburn symptoms> rec cont max (24h)  acid suppression and diet restrictions/ reviewed and instructions given in writing.   ? Allergy > doubt while on pred > 20 mg  per day s control of symptoms > wean to 10 mg daily if possible  Next step here is complete med reconciliation.  To keep things simple, I have asked the patient to first separate medicines that are perceived as maintenance, that is to be taken daily "no matter what", from those medicines that are taken on only on an as-needed basis and I have given the patient examples of both, and then return to see our NP to generate a  detailed  medication calendar which should be followed until the next physician sees the patient and updates it.    I had an extended discussion with the patient reviewing all relevant studies completed to date and  lasting 25   minutes of a 40 minute visit  Extended post hosp/ transition of care ov   Each maintenance medication was reviewed in detail including most importantly the difference between maintenance and prns and under what circumstances the prns are to be triggered using an action plan format that is not reflected in the computer generated alphabetically organized AVS.    Please see instructions for details which were reviewed in writing and the patient given a copy highlighting the part that I personally wrote and discussed at today's ov.

## 2015-12-28 NOTE — Assessment & Plan Note (Signed)
Resolved 12/27/2015 no further abx needed

## 2015-12-30 ENCOUNTER — Telehealth: Payer: Self-pay | Admitting: Internal Medicine

## 2015-12-30 NOTE — Progress Notes (Signed)
Quick Note:  LMTCB ______ 

## 2015-12-30 NOTE — Telephone Encounter (Signed)
Result Note     Call pt: Reviewed cxr and pneumonia resolved so no change in recommendations made at ov   I spoke with patient about results and she verbalized understanding and had no questions

## 2016-01-24 ENCOUNTER — Encounter: Payer: Self-pay | Admitting: Adult Health

## 2016-02-03 ENCOUNTER — Telehealth: Payer: Self-pay | Admitting: Internal Medicine

## 2016-02-03 DIAGNOSIS — E8801 Alpha-1-antitrypsin deficiency: Secondary | ICD-10-CM

## 2016-02-03 MED ORDER — PREDNISONE 20 MG PO TABS: 20.0000 mg | ORAL_TABLET | Freq: Every day | ORAL | Status: DC

## 2016-02-03 MED ORDER — GUAIFENESIN-CODEINE 100-10 MG/5ML PO SYRP: 5.0000 mL | ORAL_SOLUTION | Freq: Three times a day (TID) | ORAL | Status: DC | PRN

## 2016-02-03 NOTE — Telephone Encounter (Signed)
Patient Returned call ° ° °

## 2016-02-03 NOTE — Telephone Encounter (Signed)
Called in refill of Guafenisin-Codeine cough syrup per Dr. Thurston Hole request. Advised patient that cough syrup has been called into pharmacy. Nothing further needed.

## 2016-02-03 NOTE — Telephone Encounter (Signed)
Per 12/27/15 OV w/ MW: Patient Instructions       Silvadene cream twice daily to burn on thumb Prednisone 20 mg daily with bfast until better then one half daily until seen symbicort 80 Take 2 puffs first thing in am and then another 2 puffs about 12 hours later.  Work on inhaler technique:  relax and gently blow all the way out then take a nice smooth deep breath back in, triggering the inhaler at same time you start breathing in.  Hold for up to 5 seconds if you can. Blow out thru nose. Rinse and gargle with water when done For cough >  Tylenol #3 one every 4 hours if needed and use your flutter valve For short of breath > neb albuterol up to every 4 hours as needed Add pepcid ac 20 mg at bedtime  GERD (REFLUX)  is an extremely common cause of respiratory symptoms just like yours , many times with no obvious heartburn at all.   It can be treated with medication, but also with lifestyle changes including elevation of the head of your bed (ideally with 6 inch  bed blocks),  Smoking cessation, avoidance of late meals, excessive alcohol, and avoid fatty foods, chocolate, peppermint, colas, red wine, and acidic juices such as orange juice.   NO MINT OR MENTHOL PRODUCTS SO NO COUGH DROPS  USE SUGARLESS CANDY INSTEAD (Jolley ranchers or Stover's or Life Savers) or even ice chips will also do - the key is to swallow to prevent all throat clearing. NO OIL BASED VITAMINS - use powdered substitutes. Please remember to go to the  x-ray department downstairs for your tests - we will call you with the results when they are available. See Tammy NP in 4 weeks with all your medications, even over the counter meds, separated in two separate bags, the ones you take no matter what vs the ones you stop once you feel better and take only as needed when you feel you need them.   Tammy  will generate for you a new user friendly medication calendar that will put [place] all on the same page re: your medication use.   Without this  process, it simply isn't possible to assure that we are providing  your outpatient care  with  the attention to detail we feel you deserve.   If we cannot assure that you're getting that kind of care,  then we cannot manage your problem effectively from this clinic. Once you have seen Tammy and we are sure that we're all on the same page with your medication use she will arrange follow up with me.  ---  Called spoke with pt. She is requesting refill on prednisone (last refilled 12/27/15 #30 x 0 refills QD) and also wants guaifenesin w/ codeine cough syrup (last refilled 08/01/15).  Pt has no pending appt. Please advise MW thanks

## 2016-02-03 NOTE — Telephone Encounter (Signed)
lmtcb x1 for pt. 

## 2016-02-03 NOTE — Telephone Encounter (Signed)
Ok x 8 oz only, no refill

## 2016-02-03 NOTE — Telephone Encounter (Signed)
Pt is aware of MW's recommendations. Rx for prednisone will be sent in. Pt requests to schedule her med calendar >> this has been scheduled for 02/10/16 at 9:45am.  MW -  Pt is rquesting a prescription for guafenacin-codeine cough syrup as well. Please advise. Thanks.

## 2016-02-03 NOTE — Telephone Encounter (Signed)
Ok x one but needs to follow the instructions and if not willing to do this we won't be able to call in any further meds

## 2016-02-10 ENCOUNTER — Encounter: Payer: Self-pay | Admitting: Adult Health

## 2016-03-02 ENCOUNTER — Other Ambulatory Visit: Payer: Self-pay | Admitting: Physician Assistant

## 2016-03-02 MED FILL — ?METFORMIN HCL 500MG TABLET: 500 | 30 days supply | Qty: 60 | Fill #0

## 2016-03-02 MED FILL — HYDROCHLOROTHIAZIDE 25 MG T: 25 | 30 days supply | Qty: 30 | Fill #4

## 2016-03-02 MED FILL — glipiZIDE 5 MG TABS: 5 | 30 days supply | Qty: 60 | Fill #3

## 2016-03-02 MED FILL — predniSONE 20 MG TABS: 20 | 30 days supply | Qty: 30 | Fill #0

## 2016-03-02 MED FILL — SIMVASTATIN 40 MG TABLET: 40 | 30 days supply | Qty: 30 | Fill #0

## 2016-03-03 MED FILL — GABAPENTIN 300 MG CAPSULE: 300 | 30 days supply | Qty: 60 | Fill #2

## 2016-04-16 ENCOUNTER — Inpatient Hospital Stay (HOSPITAL_COMMUNITY): Payer: Self-pay

## 2016-04-16 ENCOUNTER — Emergency Department (HOSPITAL_COMMUNITY): Payer: Self-pay

## 2016-04-16 ENCOUNTER — Encounter (HOSPITAL_COMMUNITY): Payer: Self-pay | Admitting: Emergency Medicine

## 2016-04-16 ENCOUNTER — Inpatient Hospital Stay (HOSPITAL_COMMUNITY)
Admission: EM | Admit: 2016-04-16 | Discharge: 2016-04-21 | DRG: 439 | Disposition: A | Discharge status: Home / Self Care | Payer: Self-pay | Attending: Family Medicine | Admitting: Family Medicine

## 2016-04-16 DIAGNOSIS — Z833 Family history of diabetes mellitus: Secondary | ICD-10-CM

## 2016-04-16 DIAGNOSIS — G4733 Obstructive sleep apnea (adult) (pediatric): Secondary | ICD-10-CM | POA: Diagnosis present

## 2016-04-16 DIAGNOSIS — E1165 Type 2 diabetes mellitus with hyperglycemia: Secondary | ICD-10-CM | POA: Diagnosis present

## 2016-04-16 DIAGNOSIS — Z6841 Body Mass Index (BMI) 40.0 and over, adult: Secondary | ICD-10-CM

## 2016-04-16 DIAGNOSIS — E8801 Alpha-1-antitrypsin deficiency: Secondary | ICD-10-CM | POA: Diagnosis present

## 2016-04-16 DIAGNOSIS — Z79899 Other long term (current) drug therapy: Secondary | ICD-10-CM

## 2016-04-16 DIAGNOSIS — I5030 Unspecified diastolic (congestive) heart failure: Secondary | ICD-10-CM | POA: Diagnosis present

## 2016-04-16 DIAGNOSIS — I10 Essential (primary) hypertension: Secondary | ICD-10-CM | POA: Diagnosis present

## 2016-04-16 DIAGNOSIS — Z880 Allergy status to penicillin: Secondary | ICD-10-CM

## 2016-04-16 DIAGNOSIS — G894 Chronic pain syndrome: Secondary | ICD-10-CM | POA: Diagnosis present

## 2016-04-16 DIAGNOSIS — R1011 Right upper quadrant pain: Secondary | ICD-10-CM

## 2016-04-16 DIAGNOSIS — E869 Volume depletion, unspecified: Secondary | ICD-10-CM | POA: Diagnosis present

## 2016-04-16 DIAGNOSIS — Z825 Family history of asthma and other chronic lower respiratory diseases: Secondary | ICD-10-CM

## 2016-04-16 DIAGNOSIS — R7989 Other specified abnormal findings of blood chemistry: Secondary | ICD-10-CM | POA: Diagnosis present

## 2016-04-16 DIAGNOSIS — K567 Ileus, unspecified: Secondary | ICD-10-CM

## 2016-04-16 DIAGNOSIS — E1121 Type 2 diabetes mellitus with diabetic nephropathy: Secondary | ICD-10-CM | POA: Diagnosis present

## 2016-04-16 DIAGNOSIS — Z7951 Long term (current) use of inhaled steroids: Secondary | ICD-10-CM

## 2016-04-16 DIAGNOSIS — K859 Acute pancreatitis without necrosis or infection, unspecified: Principal | ICD-10-CM | POA: Diagnosis present

## 2016-04-16 DIAGNOSIS — E781 Pure hyperglyceridemia: Secondary | ICD-10-CM | POA: Diagnosis present

## 2016-04-16 DIAGNOSIS — Z8049 Family history of malignant neoplasm of other genital organs: Secondary | ICD-10-CM

## 2016-04-16 DIAGNOSIS — E876 Hypokalemia: Secondary | ICD-10-CM | POA: Diagnosis present

## 2016-04-16 DIAGNOSIS — R6510 Systemic inflammatory response syndrome (SIRS) of non-infectious origin without acute organ dysfunction: Secondary | ICD-10-CM | POA: Diagnosis present

## 2016-04-16 DIAGNOSIS — E871 Hypo-osmolality and hyponatremia: Secondary | ICD-10-CM | POA: Diagnosis present

## 2016-04-16 DIAGNOSIS — E1140 Type 2 diabetes mellitus with diabetic neuropathy, unspecified: Secondary | ICD-10-CM | POA: Diagnosis present

## 2016-04-16 DIAGNOSIS — Z803 Family history of malignant neoplasm of breast: Secondary | ICD-10-CM

## 2016-04-16 DIAGNOSIS — Z888 Allergy status to other drugs, medicaments and biological substances status: Secondary | ICD-10-CM

## 2016-04-16 DIAGNOSIS — Z7952 Long term (current) use of systemic steroids: Secondary | ICD-10-CM

## 2016-04-16 DIAGNOSIS — J45909 Unspecified asthma, uncomplicated: Secondary | ICD-10-CM | POA: Diagnosis present

## 2016-04-16 DIAGNOSIS — Z87891 Personal history of nicotine dependence: Secondary | ICD-10-CM

## 2016-04-16 DIAGNOSIS — E119 Type 2 diabetes mellitus without complications: Secondary | ICD-10-CM

## 2016-04-16 DIAGNOSIS — E785 Hyperlipidemia, unspecified: Secondary | ICD-10-CM | POA: Diagnosis present

## 2016-04-16 DIAGNOSIS — F1010 Alcohol abuse, uncomplicated: Secondary | ICD-10-CM | POA: Diagnosis present

## 2016-04-16 DIAGNOSIS — Z9119 Patient's noncompliance with other medical treatment and regimen: Secondary | ICD-10-CM

## 2016-04-16 DIAGNOSIS — Z8249 Family history of ischemic heart disease and other diseases of the circulatory system: Secondary | ICD-10-CM

## 2016-04-16 HISTORY — DX: Type 2 diabetes mellitus without complications: E11.9

## 2016-04-16 LAB — COMPREHENSIVE METABOLIC PANEL
ALK PHOS: 66 U/L (ref 38–126)
ALT: 20 U/L (ref 14–54)
AST: 24 U/L (ref 15–41)
Albumin: 3.6 g/dL (ref 3.5–5.0)
Anion gap: 16 — ABNORMAL HIGH (ref 5–15)
BILIRUBIN TOTAL: 0.4 mg/dL (ref 0.3–1.2)
BUN: 10 mg/dL (ref 6–20)
CALCIUM: 9.9 mg/dL (ref 8.9–10.3)
CO2: 26 mmol/L (ref 22–32)
CREATININE: 0.87 mg/dL (ref 0.44–1.00)
Chloride: 95 mmol/L — ABNORMAL LOW (ref 101–111)
Glucose, Bld: 418 mg/dL — ABNORMAL HIGH (ref 65–99)
Potassium: 3 mmol/L — ABNORMAL LOW (ref 3.5–5.1)
Sodium: 137 mmol/L (ref 135–145)
Total Protein: 7.9 g/dL (ref 6.5–8.1)

## 2016-04-16 LAB — GLUCOSE, CAPILLARY
GLUCOSE-CAPILLARY: 276 mg/dL — AB (ref 65–99)
Glucose-Capillary: 232 mg/dL — ABNORMAL HIGH (ref 65–99)

## 2016-04-16 LAB — CBC
HCT: 43.2 % (ref 36.0–46.0)
Hemoglobin: 15.6 g/dL — ABNORMAL HIGH (ref 12.0–15.0)
MCH: 31.5 pg (ref 26.0–34.0)
MCHC: 36.1 g/dL — ABNORMAL HIGH (ref 30.0–36.0)
MCV: 87.3 fL (ref 78.0–100.0)
PLATELETS: 343 10*3/uL (ref 150–400)
RBC: 4.95 MIL/uL (ref 3.87–5.11)
RDW: 12.5 % (ref 11.5–15.5)
WBC: 9.5 10*3/uL (ref 4.0–10.5)

## 2016-04-16 LAB — HCG, QUANTITATIVE, PREGNANCY: HCG, BETA CHAIN, QUANT, S: 4 m[IU]/mL (ref <5)

## 2016-04-16 LAB — LIPASE, BLOOD: Lipase: 1721 U/L — ABNORMAL HIGH (ref 11–51)

## 2016-04-16 MED ORDER — INSULIN ASPART 100 UNIT/ML ~~LOC~~ SOLN
15.0000 [IU] | Freq: Once | SUBCUTANEOUS | Status: AC
  Administered 2016-04-16: 15 [IU] via SUBCUTANEOUS
  Filled 2016-04-16: qty 1

## 2016-04-16 MED ORDER — ACETAMINOPHEN 325 MG PO TABS
650.0000 mg | ORAL_TABLET | Freq: Four times a day (QID) | ORAL | Status: DC | PRN
  Filled 2016-04-16: qty 2

## 2016-04-16 MED ORDER — VITAMIN B-1 100 MG PO TABS
100.0000 mg | ORAL_TABLET | Freq: Every day | ORAL | Status: DC
  Administered 2016-04-16 – 2016-04-21 (×6): 100 mg via ORAL
  Filled 2016-04-16 (×6): qty 1

## 2016-04-16 MED ORDER — HYDROMORPHONE HCL 1 MG/ML IJ SOLN
0.5000 mg | Freq: Once | INTRAMUSCULAR | Status: AC
  Administered 2016-04-16: 0.5 mg via INTRAVENOUS
  Filled 2016-04-16: qty 1

## 2016-04-16 MED ORDER — HYDROMORPHONE HCL 1 MG/ML IJ SOLN
1.0000 mg | Freq: Once | INTRAMUSCULAR | Status: AC
  Administered 2016-04-16: 1 mg via INTRAVENOUS
  Filled 2016-04-16: qty 1

## 2016-04-16 MED ORDER — ACETAMINOPHEN 650 MG RE SUPP
650.0000 mg | Freq: Four times a day (QID) | RECTAL | Status: DC | PRN
Start: 2016-04-16 — End: 2016-04-19

## 2016-04-16 MED ORDER — IOPAMIDOL (ISOVUE-300) INJECTION 61%
100.0000 mL | Freq: Once | INTRAVENOUS | Status: AC | PRN
  Administered 2016-04-16: 100 mL via INTRAVENOUS

## 2016-04-16 MED ORDER — OXYCODONE HCL 5 MG PO TABS
5.0000 mg | ORAL_TABLET | ORAL | Status: DC | PRN
  Administered 2016-04-16 – 2016-04-19 (×10): 5 mg via ORAL
  Filled 2016-04-16 (×10): qty 1

## 2016-04-16 MED ORDER — LORAZEPAM 2 MG/ML IJ SOLN
0.0000 mg | Freq: Two times a day (BID) | INTRAMUSCULAR | Status: DC
  Administered 2016-04-16: 2 mg via INTRAVENOUS

## 2016-04-16 MED ORDER — VENLAFAXINE HCL ER 75 MG PO CP24
75.0000 mg | ORAL_CAPSULE | Freq: Every day | ORAL | Status: DC
  Administered 2016-04-17 – 2016-04-21 (×5): 75 mg via ORAL
  Filled 2016-04-16 (×6): qty 1

## 2016-04-16 MED ORDER — FOLIC ACID 1 MG PO TABS
1.0000 mg | ORAL_TABLET | Freq: Every day | ORAL | Status: DC
  Administered 2016-04-16 – 2016-04-21 (×6): 1 mg via ORAL
  Filled 2016-04-16 (×6): qty 1

## 2016-04-16 MED ORDER — SODIUM CHLORIDE 0.9 % IV SOLN
INTRAVENOUS | Status: DC
  Administered 2016-04-16 – 2016-04-17 (×3): via INTRAVENOUS
  Administered 2016-04-18: 1000 mL via INTRAVENOUS
  Administered 2016-04-18 – 2016-04-19 (×2): via INTRAVENOUS

## 2016-04-16 MED ORDER — LORAZEPAM 2 MG/ML IJ SOLN: 1.0000 mg | Freq: Four times a day (QID) | INTRAMUSCULAR | Status: DC | PRN

## 2016-04-16 MED ORDER — ENOXAPARIN SODIUM 40 MG/0.4ML ~~LOC~~ SOLN
40.0000 mg | SUBCUTANEOUS | Status: DC
  Administered 2016-04-16 – 2016-04-20 (×5): 40 mg via SUBCUTANEOUS
  Filled 2016-04-16 (×5): qty 0.4

## 2016-04-16 MED ORDER — THIAMINE HCL 100 MG/ML IJ SOLN
100.0000 mg | Freq: Every day | INTRAMUSCULAR | Status: DC
  Filled 2016-04-16 (×2): qty 2

## 2016-04-16 MED ORDER — OXYCODONE-ACETAMINOPHEN 5-325 MG PO TABS
1.0000 | ORAL_TABLET | ORAL | Status: DC | PRN
Start: 2016-04-16 — End: 2016-04-16
  Administered 2016-04-16: 1 via ORAL
  Filled 2016-04-16: qty 1

## 2016-04-16 MED ORDER — ONDANSETRON HCL 4 MG/2ML IJ SOLN
4.0000 mg | Freq: Four times a day (QID) | INTRAMUSCULAR | Status: DC | PRN
  Administered 2016-04-16 – 2016-04-17 (×2): 4 mg via INTRAVENOUS
  Filled 2016-04-16 (×2): qty 2

## 2016-04-16 MED ORDER — ADULT MULTIVITAMIN W/MINERALS CH
1.0000 | ORAL_TABLET | Freq: Every day | ORAL | Status: DC
  Administered 2016-04-16 – 2016-04-21 (×6): 1 via ORAL
  Filled 2016-04-16 (×6): qty 1

## 2016-04-16 MED ORDER — ONDANSETRON HCL 4 MG PO TABS
4.0000 mg | ORAL_TABLET | Freq: Four times a day (QID) | ORAL | Status: DC | PRN
Start: 2016-04-16 — End: 2016-04-21

## 2016-04-16 MED ORDER — ALBUTEROL SULFATE (2.5 MG/3ML) 0.083% IN NEBU
2.5000 mg | INHALATION_SOLUTION | RESPIRATORY_TRACT | Status: DC | PRN
  Administered 2016-04-17 – 2016-04-19 (×3): 2.5 mg via RESPIRATORY_TRACT
  Filled 2016-04-16 (×3): qty 3

## 2016-04-16 MED ORDER — ONDANSETRON 4 MG PO TBDP
4.0000 mg | ORAL_TABLET | Freq: Once | ORAL | Status: AC | PRN
Start: 2016-04-16 — End: 2016-04-16
  Administered 2016-04-16: 4 mg via ORAL
  Filled 2016-04-16: qty 1

## 2016-04-16 MED ORDER — INSULIN ASPART 100 UNIT/ML ~~LOC~~ SOLN
0.0000 [IU] | SUBCUTANEOUS | Status: DC
  Administered 2016-04-16: 8 [IU] via SUBCUTANEOUS
  Administered 2016-04-16 – 2016-04-17 (×3): 5 [IU] via SUBCUTANEOUS
  Administered 2016-04-17: 8 [IU] via SUBCUTANEOUS

## 2016-04-16 MED ORDER — LORAZEPAM 2 MG/ML IJ SOLN
0.0000 mg | Freq: Four times a day (QID) | INTRAMUSCULAR | Status: DC
  Administered 2016-04-17: 2 mg via INTRAVENOUS
  Filled 2016-04-16 (×2): qty 1

## 2016-04-16 MED ORDER — MOMETASONE FURO-FORMOTEROL FUM 100-5 MCG/ACT IN AERO
2.0000 | INHALATION_SPRAY | Freq: Two times a day (BID) | RESPIRATORY_TRACT | Status: DC
  Administered 2016-04-17 – 2016-04-20 (×7): 2 via RESPIRATORY_TRACT
  Filled 2016-04-16: qty 8.8

## 2016-04-16 MED ORDER — LORAZEPAM 1 MG PO TABS: 1.0000 mg | ORAL_TABLET | Freq: Four times a day (QID) | ORAL | Status: DC | PRN

## 2016-04-16 MED ORDER — ONDANSETRON HCL 4 MG/2ML IJ SOLN
4.0000 mg | Freq: Once | INTRAMUSCULAR | Status: AC
  Administered 2016-04-16: 4 mg via INTRAVENOUS
  Filled 2016-04-16: qty 2

## 2016-04-16 MED ORDER — SODIUM CHLORIDE 0.9 % IV SOLN: INTRAVENOUS | Status: DC

## 2016-04-16 MED ORDER — LIP MEDEX EX OINT
TOPICAL_OINTMENT | CUTANEOUS | Status: AC
  Administered 2016-04-16: 19:00:00
  Filled 2016-04-16: qty 7

## 2016-04-16 MED ORDER — MORPHINE SULFATE (PF) 2 MG/ML IV SOLN
2.0000 mg | INTRAVENOUS | Status: DC | PRN
  Administered 2016-04-16 – 2016-04-20 (×15): 2 mg via INTRAVENOUS
  Filled 2016-04-16 (×15): qty 1

## 2016-04-16 MED ORDER — SIMVASTATIN 40 MG PO TABS
40.0000 mg | ORAL_TABLET | Freq: Every day | ORAL | Status: DC
  Administered 2016-04-17: 40 mg via ORAL
  Filled 2016-04-16: qty 1

## 2016-04-16 MED ORDER — ENOXAPARIN SODIUM 40 MG/0.4ML ~~LOC~~ SOLN: 40.0000 mg | SUBCUTANEOUS | Status: DC

## 2016-04-16 MED ORDER — POTASSIUM CHLORIDE 10 MEQ/100ML IV SOLN
10.0000 meq | INTRAVENOUS | Status: AC
  Administered 2016-04-16 (×4): 10 meq via INTRAVENOUS
  Filled 2016-04-16 (×4): qty 100

## 2016-04-16 MED ORDER — SODIUM CHLORIDE 0.9 % IV BOLUS (SEPSIS)
1000.0000 mL | Freq: Once | INTRAVENOUS | Status: AC
  Administered 2016-04-16: 1000 mL via INTRAVENOUS

## 2016-04-16 MED ORDER — CLONIDINE HCL 0.1 MG PO TABS
0.1000 mg | ORAL_TABLET | Freq: Two times a day (BID) | ORAL | Status: DC
  Administered 2016-04-16 – 2016-04-21 (×10): 0.1 mg via ORAL
  Filled 2016-04-16 (×9): qty 1

## 2016-04-16 NOTE — ED Notes (Signed)
PA made aware of pt's continued pain despite pain medications.

## 2016-04-16 NOTE — ED Notes (Signed)
Per PTAR. Pt reports R upper back pain radiating around to RUQ. Sudden onset this am. Pt having emesis with EMS.

## 2016-04-16 NOTE — ED Provider Notes (Signed)
CSN: [id number]     Arrival date & time 04/16/16  1231 History   First MD Initiated Contact with Patient 04/16/16 1407     Chief Complaint  Patient presents with  . Abdominal Pain   (Consider location/radiation/quality/duration/timing/severity/associated sxs/prior Treatment) HPI 45 y.o. female with a hx of DM, Asthma, presents to the Emergency Department today complaining of RUQ pain that radiates to right back that started last night. Notes worsening symptoms with eating. Noted emesis en route with EMS. Pain is 10/10 and sharp with focal tenderness on right side. Constant pain with no relief. No fevers noted. No chills. No CP/SOB. No headaches. No diarrhea. No dysuria. No numbness/tingling. Has not had symptoms similar to this before. No other symptoms noted.     Past Medical History  Diagnosis Date  . Diabetes mellitus without complication (HCC)   . Asthma    Past Surgical History  Procedure Laterality Date  . Cesarean section     Family History  Problem Relation Age of Onset  . Emphysema Mother     smoked  . Allergies Mother   . Asthma Mother   . Breast cancer Mother   . Uterine cancer Mother   . Heart disease Maternal Grandmother   . Diabetes Father    Social History  Substance Use Topics  . Smoking status: Former Smoker -- 1.50 packs/day for 25 years    Types: Cigarettes    Quit date: 12/22/2011  . Smokeless tobacco: Never Used  . Alcohol Use: No   OB History    Gravida Para Term Preterm AB TAB SAB Ectopic Multiple Living   5 4 4       4      Review of Systems ROS reviewed and all are negative for acute change except as noted in the HPI.  Allergies  Robitussin liquid center and Penicillins  Home Medications   Prior to Admission medications   Medication Sig Start Date End Date Taking? Authorizing Provider  Multiple Vitamin (MULTIVITAMIN WITH MINERALS) TABS tablet Take 1 tablet by mouth daily.   Yes Historical Provider, MD  pantoprazole (PROTONIX) 40 MG tablet  Take 1 tablet (40 mg total) by mouth daily. Take 30-60 min before first meal of the day 09/18/15  Yes Tiffany S Noel, PA-C  potassium chloride SA (K-DUR,KLOR-CON) 20 MEQ tablet Take 1 tablet (20 mEq total) by mouth daily. 05/09/15  Yes Henrietta Hoover, NP  predniSONE (DELTASONE) 20 MG tablet Take 1 tablet (20 mg total) by mouth daily. 02/03/16  Yes Nyoka Cowden, MD  simvastatin (ZOCOR) 40 MG tablet Take 1 tablet (40 mg total) by mouth daily at 6 PM. 09/18/15  Yes Tiffany Netta Cedars, PA-C  acetaminophen-codeine (TYLENOL #3) 300-30 MG tablet Take 1 tablet by mouth every 8 (eight) hours as needed for moderate pain. 12/27/15   Nyoka Cowden, MD  albuterol (PROVENTIL) (5 MG/ML) 0.5% nebulizer solution Take 0.5 mLs (2.5 mg total) by nebulization every 4 (four) hours as needed for wheezing or shortness of breath. Sob 04/24/15   Henrietta Hoover, NP  budesonide-formoterol [facility]) 80-4.5 MCG/ACT inhaler Take 2 puffs first thing in am and then another 2 puffs about 12 hours later. 12/27/15   Nyoka Cowden, MD  diclofenac sodium (VOLTAREN) 1 % GEL Apply 1 application topically 4 (four) times daily. 05/30/15   Hayden Rasmussen, NP  famotidine (PEPCID) 20 MG tablet One at bedtime 12/27/15   Nyoka Cowden, MD  gabapentin (NEURONTIN) 300 MG capsule Take 1 capsule (  300 mg total) by mouth 2 (two) times daily. 09/18/15   Tiffany Netta Cedars, PA-C  glipiZIDE (GLUCOTROL) 5 MG tablet Take 1 tablet (5 mg total) by mouth 2 (two) times daily before a meal. 09/18/15   Tiffany Netta Cedars, PA-C  guaiFENesin-codeine (ROBITUSSIN AC) 100-10 MG/5ML syrup Take 5 mLs by mouth 3 (three) times daily as needed for cough. 02/03/16   Nyoka Cowden, MD  hydrochlorothiazide (HYDRODIURIL) 25 MG tablet Take 1 tablet (25 mg total) by mouth daily. 09/18/15   Vivianne Master, PA-C  metFORMIN (GLUCOPHAGE) 500 MG tablet Take 1 tablet (500 mg total) by mouth 2 (two) times daily with a meal. Needs office visit for more refills 03/02/16   Dessa Phi, MD  Respiratory  Therapy Supplies (FLUTTER) DEVI Use as directed 12/27/15   Nyoka Cowden, MD  silver sulfADIAZINE (SILVADENE) 1 % cream Apply 1 application topically 2 (two) times daily. 12/27/15   Nyoka Cowden, MD  venlafaxine XR (EFFEXOR XR) 75 MG 24 hr capsule Take 1 capsule (75 mg total) by mouth daily with breakfast. Patient not taking: Reported on 04/16/2016 09/18/15   Vivianne Master, PA-C   BP 179/106 mmHg  Pulse 87  Temp(Src) 98.1 F (36.7 C) (Oral)  Resp 18  SpO2 97%   Physical Exam  Constitutional: She is oriented to person, place, and time. She appears well-developed and well-nourished.  Pt in acute distress. Uncomfortable with positioning  HENT:  Head: Normocephalic and atraumatic.  Eyes: EOM are normal. Pupils are equal, round, and reactive to light.  Neck: Normal range of motion. Neck supple. No tracheal deviation present.  Cardiovascular: Normal rate, regular rhythm, normal heart sounds and intact distal pulses.   No murmur heard. Pulmonary/Chest: Effort normal and breath sounds normal. No respiratory distress. She has no wheezes. She has no rales. She exhibits no tenderness.  Abdominal: Soft. Normal appearance and bowel sounds are normal. There is tenderness in the right upper quadrant. There is positive Murphy's sign. There is no rigidity, no rebound, no guarding, no CVA tenderness and no tenderness at McBurney's point.  Musculoskeletal: Normal range of motion.  Neurological: She is alert and oriented to person, place, and time.  Skin: Skin is warm and dry.  Psychiatric: She has a normal mood and affect. Her behavior is normal. Thought content normal.  Nursing note and vitals reviewed.  ED Course  Procedures (including critical care time) Labs Review Labs Reviewed  LIPASE, BLOOD - Abnormal; Notable for the following:    Lipase 1721 (*)    All other components within normal limits  COMPREHENSIVE METABOLIC PANEL - Abnormal; Notable for the following:    Potassium 3.0 (*)    Chloride  95 (*)    Glucose, Bld 418 (*)    Anion gap 16 (*)    All other components within normal limits  CBC - Abnormal; Notable for the following:    Hemoglobin 15.6 (*)    MCHC 36.1 (*)    All other components within normal limits  URINALYSIS, ROUTINE W REFLEX MICROSCOPIC (NOT AT [facility])  HCG, QUANTITATIVE, PREGNANCY   Imaging Review US Abdomen Limited Ruq  04/16/2016  CLINICAL DATA:  Right upper quadrant pain for 2 days EXAM: US ABDOMEN LIMITED - RIGHT UPPER QUADRANT COMPARISON:  12/11/2014 FINDINGS: Gallbladder: No gallstones or wall thickening visualized. No sonographic Murphy sign noted by sonographer. Common bile duct: Diameter: 3.4 mm. Liver: Mildly increased in echogenicity consistent with fatty infiltration. IMPRESSION: Fatty liver.  No acute abnormality  noted. Electronically Signed   By: Alcide Clever M.D.   On: 04/16/2016 15:17   I have personally reviewed and evaluated these images and lab results as part of my medical decision-making.   EKG Interpretation None      MDM  I have reviewed and evaluated the relevant laboratory values I have reviewed and evaluated the relevant imaging studies.  I have reviewed the relevant previous healthcare records. I have reviewed EMS Documentation. I obtained HPI from historian. Patient discussed with supervising physician  ED Course:  Assessment: Pt is a 44yF with hx DM who presents with RUQ pain since last night. Concern for Cholecystitis. On exam, pt in NAD. Nontoxic/nonseptic appearing. VSS. Afebrile. Lungs CTA. Heart RRR. Abdomen TTP focally on RUQ. Positive Murphys. [place] negative for Chole. Lipase 1721. LFTs unremarkable. Given dilaudid in ED. Pt continue NPO. Fluids given. Ordered CT abdomen to seek for source. Possible tumor? Pt endorses alcohol use and states she has a 6-12 pack per day. Notes pancreatitis a few months ago. Plan is to admit to medicine.    Disposition/Plan:  Admit Pt acknowledges and agrees with plan  Supervising Physician  Arby Barrette, MD   Final diagnoses:  RUQ pain  Acute pancreatitis, unspecified pancreatitis type       Audry Pili, PA-C 04/16/16 1550  Arby Barrette, MD 04/30/16 6045

## 2016-04-16 NOTE — ED Notes (Signed)
US at bedside

## 2016-04-16 NOTE — H&P (Signed)
History and Physical    Carla Hicks AVW:[id number] DOB: 1971-12-16 DOA: 04/16/2016  Referring MD/NP/PA:  PCP: Carla Paula, MD Outpatient Specialists:  Patient coming from: Home  Chief Complaint: Abdominal Pain   HPI: Carla Hicks is a 45 y.o. female with medical history significant of alcohol abuse, who drinks approximately 12 beers a day, morbid obesity, diabetes mellitus, dyslipidemia, presenting to the emergency department with complaints of abdominal pain. She reported drinking 2 40 ounce bottles of beer yesterday after which she developed severe epigastric abdominal pain radiating to her back in a bandlike pattern, associate with multiple episodes of nausea and vomiting. Symptoms persisted however she continued to drink beer last night. She denies hematemesis or bloody stools. She denies having previous episodes of pancreatitis. She also denies having history of alcohol abuse or delirium tremens.  ED Course: Workup in the emergency department revealed a lipase level of 1721. Further workup in the emergency department included a right upper quadrant ultrasound which did not reveal gallstones or gallbladder wall thickening. She was given IV narcotic analgesics in the emergency room for severe pain.  Review of Systems: As per HPI otherwise 10 point review of systems negative.   Past Medical History  Diagnosis Date  . Diabetes mellitus without complication (HCC)   . Asthma     Past Surgical History  Procedure Laterality Date  . Cesarean section       reports that she quit smoking about 4 years ago. Her smoking use included Cigarettes. She has a 37.5 pack-year smoking history. She has never used smokeless tobacco. She reports that she does not drink alcohol or use illicit drugs.  Allergies  Allergen Reactions  . Robitussin Liquid Center [Menthol] Hives  . Penicillins Hives    Has patient had a PCN reaction causing immediate rash, facial/tongue/throat swelling, SOB or  lightheadedness with hypotension: yes Has patient had a PCN reaction causing severe rash involving mucus membranes or skin necrosis: no Has patient had a PCN reaction that required hospitalization no Has patient had a PCN reaction occurring within the last 10 years: no If all of the above answers are "NO", then may proceed with Cephalosporin use.     Family History  Problem Relation Age of Onset  . Emphysema Mother     smoked  . Allergies Mother   . Asthma Mother   . Breast cancer Mother   . Uterine cancer Mother   . Heart disease Maternal Grandmother   . Diabetes Father     Prior to Admission medications   Medication Sig Start Date End Date Taking? Authorizing Provider  Multiple Vitamin (MULTIVITAMIN WITH MINERALS) TABS tablet Take 1 tablet by mouth daily.   Yes Historical Provider, MD  pantoprazole (PROTONIX) 40 MG tablet Take 1 tablet (40 mg total) by mouth daily. Take 30-60 min before first meal of the day 09/18/15  Yes Carla S Noel, PA-C  potassium chloride SA (K-DUR,KLOR-CON) 20 MEQ tablet Take 1 tablet (20 mEq total) by mouth daily. 05/09/15  Yes Carla Hoover, NP  predniSONE (DELTASONE) 20 MG tablet Take 1 tablet (20 mg total) by mouth daily. 02/03/16  Yes Carla Cowden, MD  simvastatin (ZOCOR) 40 MG tablet Take 1 tablet (40 mg total) by mouth daily at 6 PM. 09/18/15  Yes Carla Netta Cedars, PA-C  acetaminophen-codeine (TYLENOL #3) 300-30 MG tablet Take 1 tablet by mouth every 8 (eight) hours as needed for moderate pain. 12/27/15   Carla Cowden, MD  albuterol (PROVENTIL) (  5 MG/ML) 0.5% nebulizer solution Take 0.5 mLs (2.5 mg total) by nebulization every 4 (four) hours as needed for wheezing or shortness of breath. Sob 04/24/15   Carla Hoover, NP  budesonide-formoterol [facility]) 80-4.5 MCG/ACT inhaler Take 2 puffs first thing in am and then another 2 puffs about 12 hours later. 12/27/15   Carla Cowden, MD  diclofenac sodium (VOLTAREN) 1 % GEL Apply 1 application topically 4  (four) times daily. 05/30/15   Carla Rasmussen, NP  famotidine (PEPCID) 20 MG tablet One at bedtime 12/27/15   Carla Cowden, MD  gabapentin (NEURONTIN) 300 MG capsule Take 1 capsule (300 mg total) by mouth 2 (two) times daily. 09/18/15   Carla Netta Cedars, PA-C  glipiZIDE (GLUCOTROL) 5 MG tablet Take 1 tablet (5 mg total) by mouth 2 (two) times daily before a meal. 09/18/15   Carla Netta Cedars, PA-C  guaiFENesin-codeine (ROBITUSSIN AC) 100-10 MG/5ML syrup Take 5 mLs by mouth 3 (three) times daily as needed for cough. 02/03/16   Carla Cowden, MD  hydrochlorothiazide (HYDRODIURIL) 25 MG tablet Take 1 tablet (25 mg total) by mouth daily. 09/18/15   Carla Master, PA-C  metFORMIN (GLUCOPHAGE) 500 MG tablet Take 1 tablet (500 mg total) by mouth 2 (two) times daily with a meal. Needs office visit for more refills 03/02/16   Carla Phi, MD  Respiratory Therapy Supplies (FLUTTER) DEVI Use as directed 12/27/15   Carla Cowden, MD  silver sulfADIAZINE (SILVADENE) 1 % cream Apply 1 application topically 2 (two) times daily. 12/27/15   Carla Cowden, MD  venlafaxine XR (EFFEXOR XR) 75 MG 24 hr capsule Take 1 capsule (75 mg total) by mouth daily with breakfast. Patient not taking: Reported on 04/16/2016 09/18/15   Carla Master, PA-C    Physical Exam: Filed Vitals:   04/16/16 1245 04/16/16 1330 04/16/16 1557  BP: 180/124 179/106 178/154  Pulse: 89 87 91  Temp: 98.4 F (36.9 C) 98.1 F (36.7 C)   TempSrc: Oral Oral   Resp: 20 18 18   SpO2: 94% 97% 94%      Constitutional: She is in moderate distress, reporting abdominal pain, requesting IV narcotics Filed Vitals:   04/16/16 1245 04/16/16 1330 04/16/16 1557  BP: 180/124 179/106 178/154  Pulse: 89 87 91  Temp: 98.4 F (36.9 C) 98.1 F (36.7 C)   TempSrc: Oral Oral   Resp: 20 18 18   SpO2: 94% 97% 94%   Eyes: PERRL, lids and conjunctivae normal ENMT: Mucous membranes are moist. Dry oral mucosa Neck: normal, supple, no masses, no thyromegaly Respiratory:  clear to auscultation bilaterally, no wheezing, no crackles. Normal respiratory effort. No accessory muscle use.  Cardiovascular: Regular rate and rhythm, no murmurs / rubs / gallops. No extremity edema. 2+ pedal pulses. No carotid bruits.  Abdomen: There is pain to palpation over epigastric region with some guarding, no rebound tenderness or peritoneal signs evident. Musculoskeletal: no clubbing / cyanosis. No joint deformity upper and lower extremities. Good ROM, no contractures. Normal muscle tone.  Skin: no rashes, lesions, ulcers. No induration Neurologic: CN 2-12 grossly intact. Sensation intact, DTR normal. Strength 5/5 in all 4.  Psychiatric: Normal judgment and insight. Alert and oriented x 3. Normal mood.   Labs on Admission: I have personally reviewed following labs and imaging studies  CBC:  Recent Labs Lab 04/16/16 1354  WBC 9.5  HGB 15.6*  HCT 43.2  MCV 87.3  PLT 343   Basic Metabolic Panel:  Recent  Labs Lab 04/16/16 1354  NA 137  K 3.0*  CL 95*  CO2 26  GLUCOSE 418*  BUN 10  CREATININE 0.87  CALCIUM 9.9   GFR: CrCl cannot be calculated (Unknown ideal weight.). Liver Function Tests:  Recent Labs Lab 04/16/16 1354  AST 24  ALT 20  ALKPHOS 66  BILITOT 0.4  PROT 7.9  ALBUMIN 3.6    Recent Labs Lab 04/16/16 1354  LIPASE 1721*   No results for input(s): AMMONIA in the last 168 hours. Coagulation Profile: No results for input(s): INR, PROTIME in the last 168 hours. Cardiac Enzymes: No results for input(s): CKTOTAL, CKMB, CKMBINDEX, TROPONINI in the last 168 hours. BNP (last 3 results) No results for input(s): PROBNP in the last 8760 hours. HbA1C: No results for input(s): HGBA1C in the last 72 hours. CBG: No results for input(s): GLUCAP in the last 168 hours. Lipid Profile: No results for input(s): CHOL, HDL, LDLCALC, TRIG, CHOLHDL, LDLDIRECT in the last 72 hours. Thyroid Function Tests: No results for input(s): TSH, T4TOTAL, FREET4, T3FREE,  THYROIDAB in the last 72 hours. Anemia Panel: No results for input(s): VITAMINB12, FOLATE, FERRITIN, TIBC, IRON, RETICCTPCT in the last 72 hours. Urine analysis:    Component Value Date/Time   COLORURINE YELLOW 12/16/2015 2138   APPEARANCEUR CLEAR 12/16/2015 2138   LABSPEC 1.023 12/16/2015 2138   PHURINE 6.0 12/16/2015 2138   GLUCOSEU >1000* 12/16/2015 2138   HGBUR NEGATIVE 12/16/2015 2138   BILIRUBINUR NEGATIVE 12/16/2015 2138   BILIRUBINUR neg 09/18/2015 1131   KETONESUR 15* 12/16/2015 2138   PROTEINUR NEGATIVE 12/16/2015 2138   PROTEINUR 30 09/18/2015 1131   UROBILINOGEN 0.2 09/18/2015 1131   UROBILINOGEN 0.2 12/11/2014 1522   NITRITE NEGATIVE 12/16/2015 2138   NITRITE neg 09/18/2015 1131   LEUKOCYTESUR NEGATIVE 12/16/2015 2138   Sepsis Labs: (procalcitonin:4,lacticidven:4) )No results found for this or any previous visit (from the past 240 hour(s)).   Radiological Exams on Admission: US Abdomen Limited Ruq  04/16/2016  CLINICAL DATA:  Right upper quadrant pain for 2 days EXAM: US ABDOMEN LIMITED - RIGHT UPPER QUADRANT COMPARISON:  12/11/2014 FINDINGS: Gallbladder: No gallstones or wall thickening visualized. No sonographic Murphy sign noted by sonographer. Common bile duct: Diameter: 3.4 mm. Liver: Mildly increased in echogenicity consistent with fatty infiltration. IMPRESSION: Fatty liver.  No acute abnormality noted. Electronically Signed   By: Alcide Clever M.D.   On: 04/16/2016 15:17    EKG: Independently reviewed.   Assessment/Plan Principal Problem:   Pancreatitis Active Problems:   Essential hypertension, benign   Controlled type 2 diabetes mellitus without complication, without long-term current use of insulin (HCC)   Hypokalemia   Alcohol abuse   Pancreatitis, acute   1.  Acute alcohol-induced pancreatitis. Mrs Utke is a 45 year old female with a history of alcohol abuse, family members reporting she drinks about 12 beers a day, developing severe  epigastric pain yesterday. Lab work in the emergency room showing an elevated lipase level of 1,721. Abdominal ultrasound did not reveal evidence of gallstones. Will provide bowel rest, IV fluid resuscitation with normal saline running at 125 mL/hour, as needed narcotic analgesia for pain management, repeat lipase level in a.m. We'll also check a fasting lipid panel.   2.  Alcohol abuse. Her son present at bedside stating she drinks at least a 12 pack of beer on a daily basis. She denies having a history of alcohol withdrawal. Will place her on the CIWA protocol with IV Ativan as she has a risk for developing  withdrawal.   3.  Hypokalemia. Likely secondary to GI losses from nausea and vomiting caused by acute pancreatitis. Will provide 40 mEq of IV potassium chloride, repeat labs in a.m.  4.  History of diabetes mellitus type 2. She will be made nothing by mouth, hold oral hypoglycemic agents, provide sliding scale with Accu-Cheks every 4 hours. Presented with blood sugar of 418 in the emergency department, 15 units of NovoLog ordered to be given now.  5.  Hypertension. She was found to have elevated blood pressures in the emergency room likely resulting from abdominal pain secondary to acute pancreatitis. Will provide clonidine 0.1 mg by mouth twice a day. Pain management. Holding hydrochlorothiazide since she is getting IV fluids.   6.  Dyslipidemia. Continue statin   DVT prophylaxis: Lovenox Code Status: Full code Family Communication: I spoke to her son who was present at bedside Disposition Plan: Anticipate she'll require greater than 2 nights hospitalization Consults called:  Admission status: Will admit to Daneil Dan MD [facility] Pager [phone]  If 7PM-7AM, please contact night-coverage [email or web address] Password [facility]  04/16/2016, 4:52 PM

## 2016-04-16 NOTE — ED Notes (Signed)
Pt requesting pain medication but was observed falling asleep quickly after making request.  Will continue to evaluate need for pain medication.

## 2016-04-17 DIAGNOSIS — E119 Type 2 diabetes mellitus without complications: Secondary | ICD-10-CM

## 2016-04-17 DIAGNOSIS — K860 Alcohol-induced chronic pancreatitis: Secondary | ICD-10-CM

## 2016-04-17 LAB — CBC
HCT: 44.5 % (ref 36.0–46.0)
HEMOGLOBIN: 15.9 g/dL — AB (ref 12.0–15.0)
MCH: 31.7 pg (ref 26.0–34.0)
MCHC: 35.7 g/dL (ref 30.0–36.0)
MCV: 88.8 fL (ref 78.0–100.0)
Platelets: 300 10*3/uL (ref 150–400)
RBC: 5.01 MIL/uL (ref 3.87–5.11)
RDW: 12.7 % (ref 11.5–15.5)
WBC: 10.8 10*3/uL — ABNORMAL HIGH (ref 4.0–10.5)

## 2016-04-17 LAB — COMPREHENSIVE METABOLIC PANEL
ALBUMIN: 3.1 g/dL — AB (ref 3.5–5.0)
ALT: 14 U/L (ref 14–54)
ANION GAP: 12 (ref 5–15)
AST: 21 U/L (ref 15–41)
Alkaline Phosphatase: 58 U/L (ref 38–126)
BUN: 10 mg/dL (ref 6–20)
CO2: 24 mmol/L (ref 22–32)
Calcium: 8.8 mg/dL — ABNORMAL LOW (ref 8.9–10.3)
Chloride: 102 mmol/L (ref 101–111)
Creatinine, Ser: 0.71 mg/dL (ref 0.44–1.00)
GFR calc Af Amer: >60 mL/min (ref >60)
GFR calc non Af Amer: >60 mL/min (ref >60)
GLUCOSE: 254 mg/dL — AB (ref 65–99)
POTASSIUM: 3.5 mmol/L (ref 3.5–5.1)
SODIUM: 138 mmol/L (ref 135–145)
Total Bilirubin: 0.6 mg/dL (ref 0.3–1.2)
Total Protein: 6.8 g/dL (ref 6.5–8.1)

## 2016-04-17 LAB — GLUCOSE, CAPILLARY
GLUCOSE-CAPILLARY: 233 mg/dL — AB (ref 65–99)
GLUCOSE-CAPILLARY: 227 mg/dL — AB (ref 65–99)
Glucose-Capillary: 270 mg/dL — ABNORMAL HIGH (ref 65–99)
Glucose-Capillary: 247 mg/dL — ABNORMAL HIGH (ref 65–99)
Glucose-Capillary: 233 mg/dL — ABNORMAL HIGH (ref 65–99)

## 2016-04-17 LAB — LIPID PANEL
Cholesterol: 177 mg/dL (ref 0–200)
HDL: 61 mg/dL (ref >40)
LDL CALC: UNDETERMINED mg/dL (ref 0–99)
TRIGLYCERIDES: 401 mg/dL — AB (ref <150)
Total CHOL/HDL Ratio: 2.9 RATIO
VLDL: UNDETERMINED mg/dL (ref 0–40)

## 2016-04-17 LAB — LIPASE, BLOOD: Lipase: 564 U/L — ABNORMAL HIGH (ref 11–51)

## 2016-04-17 MED ORDER — PREDNISONE 20 MG PO TABS
20.0000 mg | ORAL_TABLET | Freq: Every day | ORAL | Status: DC
  Administered 2016-04-17 – 2016-04-18 (×2): 20 mg via ORAL
  Filled 2016-04-17 (×2): qty 1

## 2016-04-17 MED ORDER — INSULIN ASPART 100 UNIT/ML ~~LOC~~ SOLN
3.0000 [IU] | Freq: Three times a day (TID) | SUBCUTANEOUS | Status: DC
Start: 2016-04-17 — End: 2016-04-21
  Administered 2016-04-20 – 2016-04-21 (×4): 3 [IU] via SUBCUTANEOUS

## 2016-04-17 MED ORDER — INSULIN ASPART 100 UNIT/ML ~~LOC~~ SOLN
0.0000 [IU] | Freq: Three times a day (TID) | SUBCUTANEOUS | Status: DC
  Administered 2016-04-17 – 2016-04-18 (×2): 5 [IU] via SUBCUTANEOUS
  Administered 2016-04-18: 15 [IU] via SUBCUTANEOUS
  Administered 2016-04-18: 11 [IU] via SUBCUTANEOUS
  Administered 2016-04-19: 8 [IU] via SUBCUTANEOUS
  Administered 2016-04-19: 11 [IU] via SUBCUTANEOUS
  Administered 2016-04-19: 8 [IU] via SUBCUTANEOUS
  Administered 2016-04-20: 11 [IU] via SUBCUTANEOUS
  Administered 2016-04-20: 8 [IU] via SUBCUTANEOUS
  Administered 2016-04-20: 5 [IU] via SUBCUTANEOUS
  Administered 2016-04-21: 11 [IU] via SUBCUTANEOUS
  Administered 2016-04-21: 8 [IU] via SUBCUTANEOUS

## 2016-04-17 MED ORDER — LABETALOL HCL 5 MG/ML IV SOLN
10.0000 mg | Freq: Once | INTRAVENOUS | Status: AC
  Administered 2016-04-17: 10 mg via INTRAVENOUS
  Filled 2016-04-17: qty 4

## 2016-04-17 MED ORDER — PANTOPRAZOLE SODIUM 40 MG PO TBEC
40.0000 mg | DELAYED_RELEASE_TABLET | Freq: Every day | ORAL | Status: DC
  Administered 2016-04-17 – 2016-04-21 (×5): 40 mg via ORAL
  Filled 2016-04-17 (×5): qty 1

## 2016-04-17 MED ORDER — HYDROCHLOROTHIAZIDE 25 MG PO TABS
25.0000 mg | ORAL_TABLET | Freq: Every day | ORAL | Status: DC
  Administered 2016-04-17 – 2016-04-18 (×2): 25 mg via ORAL
  Filled 2016-04-17 (×2): qty 1

## 2016-04-17 NOTE — Progress Notes (Signed)
PROGRESS NOTE    Carla Hicks  HQI:[id number] DOB: 01-11-1971 DOA: 04/16/2016 PCP: Lora Paula, MD  Outpatient Specialists:     Brief Narrative:  45 y/o ? History of leaving AGAINST MEDICAL ADVICE Quit smoking 2013-also? Asthma/alpha-1 antitrypsin deficiency on chronic prednisone Hypertension DM TY II+ nephropathy + neuropathy Morbid obesity There is no weight on file to calculate BMI.  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  Patient awake and alert and competent to understand whats going on Disposition Plan: home when stable    Subjective: Fair Sleepy Pain mod, 5/10 epig but controlled No n but vomited  Objective: Filed Vitals:   04/17/16 0149 04/17/16 0444 04/17/16 1001 04/17/16 1351  BP: 144/89 148/99    Pulse: 96 98    Temp: 99 F (37.2 C) 97.9 F (36.6 C)    TempSrc: Oral Oral    Resp: 21 20    SpO2: 96% 97% 93% 97%    Intake/Output Summary (Last 24 hours) at 04/17/16 1400 Last  data filed at 04/17/16 0501  Gross per 24 hour  Intake 1685.42 ml  Output    800 ml  Net 885.42 ml   There were no vitals filed for this visit.  Examination:  General exam: calm Respiratory system: Clear to auscultation.  Cardiovascular system: S1 & S2 heard, RRR. No JVD, murmurs, rubs, gallops or clicks. No pedal edema. Gastrointestinal system: Abdomen is slightly distended Central nervous system: Alert and oriented. No focal neurological deficits. Extremities: Symmetric 5 x 5 power. Skin: No rashes, lesions or ulcers Psychiatry: Judgement and insight appear normal. Mood & affect appropriate.     Data Reviewed: I have personally reviewed following labs and imaging studies  CBC:  Recent Labs Lab 04/16/16 1354 04/17/16 0333  WBC 9.5 10.8*  HGB 15.6* 15.9*  HCT 43.2 44.5  MCV 87.3 88.8  PLT 343 300   Basic Metabolic Panel:  Recent Labs Lab 04/16/16 1354 04/17/16 0817  NA 137 138  K 3.0* 3.5  CL 95* 102  CO2 26 24  GLUCOSE 418* 254*  BUN 10 10  CREATININE 0.87 0.71  CALCIUM 9.9 8.8*   GFR: CrCl cannot be calculated (Unknown ideal weight.). Liver Function Tests:  Recent Labs Lab 04/16/16 1354 04/17/16 0817  AST 24 21  ALT 20 14  ALKPHOS 66 58  BILITOT 0.4 0.6  PROT 7.9 6.8  ALBUMIN 3.6 3.1*    Recent Labs Lab 04/16/16 1354 04/17/16 0817  LIPASE 1721* 564*   No results for input(s): AMMONIA in the last 168 hours. Coagulation Profile: No results for  input(s): INR, PROTIME in the last 168 hours. Cardiac Enzymes: No results for input(s): CKTOTAL, CKMB, CKMBINDEX, TROPONINI in the last 168 hours. BNP (last 3 results) No results for input(s): PROBNP in the last 8760 hours. HbA1C: No results for input(s): HGBA1C in the last 72 hours. CBG:  Recent Labs Lab 04/16/16 2003 04/16/16 2350 04/17/16 0440 04/17/16 0752 04/17/16 1225  GLUCAP 232* 276* 270* 247* 233*   Lipid Profile:  Recent Labs  04/17/16 0817  CHOL 177  HDL 61  LDLCALC  UNABLE TO CALCULATE IF TRIGLYCERIDE OVER 400 mg/dL  TRIG 372*  CHOLHDL 2.9   Thyroid Function Tests: No results for input(s): TSH, T4TOTAL, FREET4, T3FREE, THYROIDAB in the last 72 hours. Anemia Panel: No results for input(s): VITAMINB12, FOLATE, FERRITIN, TIBC, IRON, RETICCTPCT in the last 72 hours. Urine analysis:    Component Value Date/Time   COLORURINE YELLOW 12/16/2015 2138   APPEARANCEUR CLEAR 12/16/2015 2138   LABSPEC 1.023 12/16/2015 2138   PHURINE 6.0 12/16/2015 2138   GLUCOSEU >1000* 12/16/2015 2138   HGBUR NEGATIVE 12/16/2015 2138   BILIRUBINUR NEGATIVE 12/16/2015 2138   BILIRUBINUR neg 09/18/2015 1131   KETONESUR 15* 12/16/2015 2138   PROTEINUR NEGATIVE 12/16/2015 2138   PROTEINUR 30 09/18/2015 1131   UROBILINOGEN 0.2 09/18/2015 1131   UROBILINOGEN 0.2 12/11/2014 1522   NITRITE NEGATIVE 12/16/2015 2138   NITRITE neg 09/18/2015 1131   LEUKOCYTESUR NEGATIVE 12/16/2015 2138     Radiology Studies: Ct Abdomen Pelvis W Contrast  04/16/2016  CLINICAL DATA:  Patient with right upper quadrant pain radiating to the back. EXAM: CT ABDOMEN AND PELVIS WITH CONTRAST TECHNIQUE: Multidetector CT imaging of the abdomen and pelvis was performed using the standard protocol following bolus administration of intravenous contrast. CONTRAST:  ISOVUE-300 IOPAMIDOL (ISOVUE-300) INJECTION 61% COMPARISON:  None. FINDINGS: Lower chest: Normal heart size. Dependent atelectasis and/or scarring within the bilateral lower lobes. No pleural effusion. Hepatobiliary: The liver is normal in size and contour. No focal hepatic lesions identified. Fatty deposition adjacent to the falciform ligament. Gallbladder is unremarkable. Pancreas: The pancreas is edematous. There is a large amount of surrounding fat stranding and fluid. The pancreatic parenchyma enhances without evidence for necrosis. Spleen: Unremarkable Adrenals/Urinary Tract: The adrenal glands are normal. Kidneys enhance symmetrically with  contrast. No hydronephrosis. Urinary bladder is unremarkable. Focal atrophy inferior pole right kidney. Too small to characterize low-attenuation lesion interpolar region right kidney. Stomach/Bowel: Normal appendix. No evidence for bowel obstruction. Normal morphology to the stomach. No free intraperitoneal air. Vascular/Lymphatic: Normal caliber abdominal aorta. Circumaortic left renal vein. No retroperitoneal adenopathy. The splenic and portal veins are patent. Other: Uterus and adnexal structures are unremarkable. Musculoskeletal: Small fat containing ventral abdominal wall hernia (image 78; series 2). No aggressive or acute appearing osseous lesions. IMPRESSION: Findings compatible with acute pancreatitis. Electronically Signed   By: Annia Belt M.D.   On: 04/16/2016 17:34   US Abdomen Limited Ruq  04/16/2016  CLINICAL DATA:  Right upper quadrant pain for 2 days EXAM: US ABDOMEN LIMITED - RIGHT UPPER QUADRANT COMPARISON:  12/11/2014 FINDINGS: Gallbladder: No gallstones or wall thickening visualized. No sonographic Murphy sign noted by sonographer. Common bile duct: Diameter: 3.4 mm. Liver: Mildly increased in echogenicity consistent with fatty infiltration. IMPRESSION: Fatty liver.  No acute abnormality noted. Electronically Signed   By: Alcide Clever M.D.   On: 04/16/2016 15:17        Scheduled Meds: . cloNIDine  0.1 mg Oral BID  . enoxaparin (LOVENOX) injection  40 mg  Subcutaneous Q24H  . folic acid  1 mg Oral Daily  . hydrochlorothiazide  25 mg Oral Daily  . insulin aspart  0-15 Units Subcutaneous Q4H  . LORazepam  0-4 mg Intravenous Q6H   Followed by  . [START ON 04/18/2016] LORazepam  0-4 mg Intravenous Q12H  . mometasone-formoterol  2 puff Inhalation BID  . multivitamin with minerals  1 tablet Oral Daily  . pantoprazole  40 mg Oral Daily  . predniSONE  20 mg Oral Daily  . simvastatin  40 mg Oral q1800  . thiamine  100 mg Oral Daily   Or  . thiamine  100 mg Intravenous Daily  .  venlafaxine XR  75 mg Oral Q breakfast   Continuous Infusions: . sodium chloride 125 mL/hr at 04/17/16 1152     LOS: 1 day    Time spent: 25   Pleas Koch, MD [facility]   If 7PM-7AM, please contact night-coverage [email or web address] Password [email or web address] 04/17/2016, 2:00 PM

## 2016-04-17 NOTE — Progress Notes (Signed)
Inpatient Diabetes Program Recommendations  AACE/ADA: New Consensus Statement on Inpatient Glycemic Control (2015)  Target Ranges:  Prepandial:   less than 140 mg/dL      Peak postprandial:   less than 180 mg/dL (1-2 hours)      Critically ill patients:  140 - 180 mg/dL   Review of Glycemic Control  Diabetes history: DM2 Outpatient Diabetes medications: glipizide 5 mg bid, metformin 500 mg bid, Prednisone 20 mg QD Current orders for Inpatient glycemic control: Novolog moderate Q4H.  Results for Carla Hicks, Carla Hicks (MRN [id number]) as of 04/17/2016 12:25  Ref. Range 12/17/2015 07:50 04/16/2016 20:03 04/16/2016 23:50 04/17/2016 04:40 04/17/2016 07:52  Glucose-Capillary Latest Ref Range: 65-99 mg/dL 265 (H) 232 (H) 276 (H) 270 (H) 247 (H)  Results for Carla Hicks, Carla Hicks (MRN [id number]) as of 04/17/2016 12:25  Ref. Range 04/17/2016 08:17  Sodium Latest Ref Range: 135-145 mmol/L 138  Potassium Latest Ref Range: 3.5-5.1 mmol/L 3.5  Chloride Latest Ref Range: 101-111 mmol/L 102  CO2 Latest Ref Range: 22-32 mmol/L 24  BUN Latest Ref Range: 6-20 mg/dL 10  Creatinine Latest Ref Range: 0.44-1.00 mg/dL 0.71  Calcium Latest Ref Range: 8.9-10.3 mg/dL 8.8 (L)  EGFR (Non-African Amer.) Latest Ref Range: >60 mL/min >60  EGFR (African American) Latest Ref Range: >60 mL/min >60  Glucose Latest Ref Range: 65-99 mg/dL 254 (H)  Anion gap Latest Ref Range: 5-15  12   Blood sugars in 200s. Needs tighter control. Check HgbA1C to assess glycemic control prior to admission.  Inpatient Diabetes Program Recommendations:    Add CHO mod med to soft diet. HgbA1C - need update. Increase Novolog to resistant Q4H. If CBGs continue > 180 mg/dL, add low dose Lantus - 15 units Q24H.  Will continue to follow. Thank you. Lorenda Peck, RD, LDN, CDE Inpatient Diabetes Coordinator [phone]

## 2016-04-17 NOTE — Progress Notes (Signed)
Initial Nutrition Assessment  DOCUMENTATION CODES:   Morbid obesity  INTERVENTION:  -Recommend recording current height and weight (last recorded 1/17).  -Continue to monitor nutritional needs.  NUTRITION DIAGNOSIS:   Inadequate oral intake related to nausea, vomiting, poor appetite as evidenced by per patient/family report.  GOAL:   Patient will meet greater than or equal to 90% of their needs  MONITOR:   Diet advancement, Labs, Weight trends, Skin, I & O's  REASON FOR ASSESSMENT:   Malnutrition Screening Tool    ASSESSMENT:   45 y.o. female with medical history significant of alcohol abuse, who drinks approximately 12 beers a day, morbid obesity, diabetes mellitus, dyslipidemia, presenting to the emergency department with complaints of abdominal pain. She reported drinking 2 40 ounce bottles of beer yesterday after which she developed severe epigastric abdominal pain radiating to her back in a bandlike pattern, associate with multiple episodes of nausea and vomiting. Symptoms persisted however she continued to drink beer last night. She denies hematemesis or bloody stools. She denies having previous episodes of pancreatitis. She also denies having history of alcohol abuse or delirium tremens.  Pt seen for MST. Pt BMI classified as obese. Pt in pain at time of visit. Pt asked for doctor for pain meds. Pt weight has fluctuated between 257 lbs and 263 lbs since 7/17. Pt has not experienced any significant weight loss in the past 6 months. Per chart, no recent weight and height available. Estimated needs are calculated based on information from January.   Pt in pain at time of visit. Pt asked for doctor for pain meds. Pt not answered 3 questions before closing eyes and becoming unresponsive. Pt reports not eating for 3 days PTA. Pt reports N/V and abdominal pain 3 days PTA. Pt denies these symptoms prior to these 3 days. Pt is currently NPO. Will monitor for diet advancement, weight  trends, and meal completion to determine best approach going forward. Will attempt to obtain more information at f/u and order supplements if needed.   NFPE: No fat depletion, no muscle depletion, no edema.  Labs reviewed; Ca 8.8 mg/dl, CBGs 189-842 mg/dl. Meds reviewed; folic acid 1 mg, MVI w/ minerals, B1 100 mg.   Diet Order:  DIET SOFT Room service appropriate?: Yes; Fluid consistency:: Thin  Skin:  Wound (see comment) (R foot)  Last BM:  unknown  Height:   Ht Readings from Last 1 Encounters:  12/27/15 5\' 4"  (1.626 m)    Weight:   Wt Readings from Last 1 Encounters:  12/27/15 257 lb 12.8 oz (116.937 kg)    Ideal Body Weight:  54.5 kg  BMI:  44.1  Estimated Nutritional Needs:   Kcal:  2100-2300 (30-33 kcal/kg ABW)  Protein:  85-95 g (0.8 g/kg)  Fluid:  2L  EDUCATION NEEDS:   No education needs identified at this time  Beryle Quant, MS [facility] Dietetic Intern Pager [phone]

## 2016-04-18 LAB — GLUCOSE, CAPILLARY
GLUCOSE-CAPILLARY: 414 mg/dL — AB (ref 65–99)
GLUCOSE-CAPILLARY: 410 mg/dL — AB (ref 65–99)
GLUCOSE-CAPILLARY: 346 mg/dL — AB (ref 65–99)
GLUCOSE-CAPILLARY: 231 mg/dL — AB (ref 65–99)
GLUCOSE-CAPILLARY: 245 mg/dL — AB (ref 65–99)
Glucose-Capillary: 405 mg/dL — ABNORMAL HIGH (ref 65–99)

## 2016-04-18 LAB — COMPREHENSIVE METABOLIC PANEL
ALBUMIN: 2.6 g/dL — AB (ref 3.5–5.0)
ALT: 14 U/L (ref 14–54)
ANION GAP: 12 (ref 5–15)
AST: 42 U/L — AB (ref 15–41)
Alkaline Phosphatase: 53 U/L (ref 38–126)
BUN: 16 mg/dL (ref 6–20)
CO2: 23 mmol/L (ref 22–32)
Calcium: 7.7 mg/dL — ABNORMAL LOW (ref 8.9–10.3)
Chloride: 93 mmol/L — ABNORMAL LOW (ref 101–111)
Creatinine, Ser: 0.93 mg/dL (ref 0.44–1.00)
GFR calc Af Amer: >60 mL/min (ref >60)
GFR calc non Af Amer: >60 mL/min (ref >60)
GLUCOSE: 397 mg/dL — AB (ref 65–99)
POTASSIUM: 4.1 mmol/L (ref 3.5–5.1)
SODIUM: 128 mmol/L — AB (ref 135–145)
Total Bilirubin: 1.5 mg/dL — ABNORMAL HIGH (ref 0.3–1.2)
Total Protein: 6.1 g/dL — ABNORMAL LOW (ref 6.5–8.1)

## 2016-04-18 LAB — LIPASE, BLOOD: Lipase: 537 U/L — ABNORMAL HIGH (ref 11–51)

## 2016-04-18 MED ORDER — GEMFIBROZIL 600 MG PO TABS
600.0000 mg | ORAL_TABLET | Freq: Two times a day (BID) | ORAL | Status: DC
  Administered 2016-04-18 – 2016-04-21 (×6): 600 mg via ORAL
  Filled 2016-04-18 (×9): qty 1

## 2016-04-18 MED ORDER — INSULIN ASPART 100 UNIT/ML ~~LOC~~ SOLN
5.0000 [IU] | Freq: Once | SUBCUTANEOUS | Status: AC
  Administered 2016-04-18: 5 [IU] via SUBCUTANEOUS

## 2016-04-18 MED ORDER — PREDNISONE 5 MG PO TABS: 10.0000 mg | ORAL_TABLET | Freq: Every day | ORAL | Status: DC

## 2016-04-18 MED ORDER — INSULIN DETEMIR 100 UNIT/ML ~~LOC~~ SOLN
10.0000 [IU] | Freq: Every day | SUBCUTANEOUS | Status: DC
  Administered 2016-04-18: 10 [IU] via SUBCUTANEOUS
  Filled 2016-04-18 (×2): qty 0.1

## 2016-04-18 MED ORDER — PREDNISONE 20 MG PO TABS
20.0000 mg | ORAL_TABLET | Freq: Every day | ORAL | Status: DC
  Administered 2016-04-19 – 2016-04-21 (×3): 20 mg via ORAL
  Filled 2016-04-18 (×3): qty 1

## 2016-04-18 MED ORDER — ATORVASTATIN CALCIUM 10 MG PO TABS
20.0000 mg | ORAL_TABLET | Freq: Every day | ORAL | Status: DC
  Administered 2016-04-18 – 2016-04-20 (×3): 20 mg via ORAL
  Filled 2016-04-18 (×3): qty 2

## 2016-04-18 MED ORDER — POLYETHYLENE GLYCOL 3350 17 G PO PACK
17.0000 g | PACK | Freq: Every day | ORAL | Status: DC
  Administered 2016-04-18 – 2016-04-20 (×3): 17 g via ORAL
  Filled 2016-04-18 (×3): qty 1

## 2016-04-18 NOTE — Progress Notes (Signed)
Pt. Requested ice cream and ate one container chocolate ice cream.

## 2016-04-18 NOTE — Progress Notes (Signed)
PROGRESS NOTE    Carla Hicks  NKN:[id number] DOB: 08-02-1971 DOA: 04/16/2016 PCP: Lora Paula, MD  Outpatient Specialists:     Brief Narrative:  45 y/o ? History of leaving AGAINST MEDICAL ADVICE Quit smoking 2013-also? Asthma/alpha-1 antitrypsin deficiency on chronic prednisone 20 mg daily Hypertension DM TY II+ nephropathy + neuropathy Morbid obesity There is no weight on file to calculate BMI.  Chronic ethanolism Heart failure diastolic dysfunctionOSA    Assessment & Plan:   Principal Problem: Pancreatitis, alcoholic/hyperTriglyceridemia SIRS Volume depletion with hypovolemic hyponatremia Lipase 1200 on admission-->500 now -Volume repletion saline 75 cc --> 125 cc/HR 4.29 -LDL not able to be calculated--?if >400 -start Crestor 40, started Gemfibrozil 600 bid  -Allow  dietOut of bedPain controlRepeat labs a.m.Ambulate -at present doesn't require insulin Gtt nor Pheresis -Rpt labs and follow closely clinically--if worsening abd pain consideration to be given to Insulin gtt   Essential hypertension, benign -Hold HCTZ 2/2 to Hyponatremia -Htn Only moderately controlled -Cont Clonidine 0.1 started this hospital stay  Uncontrolled type 2 diabetes mellitus + diabetic nephropathy/neuropathy -exacerbated by Pancreatitis with ? intrinsic insulin production and use of chr Prednisone -patient hesitant to scale back steroids to 10 tid -Hold glucoctrol 5 twice a day, metformin 500 twice a day -Sugars as high as 450 4.29 -Start levemir 10 -cont mod scale coverage-conside rin am resistant scale if CBg continue in 300 range Asthma/alpha 1 antitrypsin deficiency -Continue chronic steroids 20 mg daily -Continue inhalers Symbicort 2 puffs every morning and then every 12 -Continue albuterol 0.5 every 4 when necessary wheeze   Hypokalemia -re-check labs am   Alcohol abuse -Patient drank a six pack and then a 40 of beer PTA -CIWA neg-Dc/ Ativan protocol 4.29 -note Bili  trend ? slightly -recheck am labs      DVT prophylaxis: Lovenox Code Status: Full Family Communication: no family +.  Patient awake and alert and competent to understand whats going on Disposition Plan: home when stable    Subjective:  Abd pain + seems comfortable in the bed No cp No vomit today-did vomit last pm tol po only fairly  Objective: Filed Vitals:   04/17/16 2307 04/18/16 0514 04/18/16 0849 04/18/16 1308  BP: 145/92 150/91  153/86  Pulse: 115 115  108  Temp:  99.3 F (37.4 C)  97.5 F (36.4 C)  TempSrc:  Oral  Oral  Resp:  19  20  SpO2:  96% 94% 96%    Intake/Output Summary (Last 24 hours) at 04/18/16 1428 Last data filed at 04/18/16 1426  Gross per 24 hour  Intake   1978 ml  Output   1480 ml  Net    498 ml   There were no vitals filed for this visit.  Examination:  General exam: calm Respiratory system: Clear to auscultation.  Cardiovascular system: S1 & S2 heard, RRR. No JVD, murmurs, rubs, gallops or clicks. No pedal edema. Gastrointestinal system: Abdomen is slightly distended, no rebound no gaurd Central nervous system: Alert and oriented. No focal neurological deficits. Extremities: Symmetric 5 x 5 power. Skin: No rashes, lesions or ulcers Psychiatry: Judgement and insight appear normal. Mood & affect appropriate.     Data Reviewed: I have personally reviewed following labs and imaging studies  CBC:  Recent Labs Lab 04/16/16 1354 04/17/16 0333  WBC 9.5 10.8*  HGB 15.6* 15.9*  HCT 43.2 44.5  MCV 87.3 88.8  PLT 343 300   Basic Metabolic Panel:  Recent Labs Lab 04/16/16 1354 04/17/16 0817 04/18/16  0830  NA 137 138 128*  K 3.0* 3.5 4.1  CL 95* 102 93*  CO2 26 24 23   GLUCOSE 418* 254* 397*  BUN 10 10 16   CREATININE 0.87 0.71 0.93  CALCIUM 9.9 8.8* 7.7*   GFR: CrCl cannot be calculated (Unknown ideal weight.). Liver Function Tests:  Recent Labs Lab 04/16/16 1354 04/17/16 0817 04/18/16 0830  AST 24 21 42*  ALT 20  14 14   ALKPHOS 66 58 53  BILITOT 0.4 0.6 1.5*  PROT 7.9 6.8 6.1*  ALBUMIN 3.6 3.1* 2.6*    Recent Labs Lab 04/16/16 1354 04/17/16 0817 04/18/16 0830  LIPASE 1721* 564* 537*   No results for input(s): AMMONIA in the last 168 hours. Coagulation Profile: No results for input(s): INR, PROTIME in the last 168 hours. Cardiac Enzymes: No results for input(s): CKTOTAL, CKMB, CKMBINDEX, TROPONINI in the last 168 hours. BNP (last 3 results) No results for input(s): PROBNP in the last 8760 hours. HbA1C: No results for input(s): HGBA1C in the last 72 hours. CBG:  Recent Labs Lab 04/17/16 2114 04/18/16 0757 04/18/16 0859 04/18/16 0951 04/18/16 1158  GLUCAP 227* 414* 405* 410* 346*   Lipid Profile:  Recent Labs  04/17/16 0817  CHOL 177  HDL 61  LDLCALC UNABLE TO CALCULATE IF TRIGLYCERIDE OVER 400 mg/dL  TRIG 454*  CHOLHDL 2.9   Thyroid Function Tests: No results for input(s): TSH, T4TOTAL, FREET4, T3FREE, THYROIDAB in the last 72 hours. Anemia Panel: No results for input(s): VITAMINB12, FOLATE, FERRITIN, TIBC, IRON, RETICCTPCT in the last 72 hours. Urine analysis:    Component Value Date/Time   COLORURINE YELLOW 12/16/2015 2138   APPEARANCEUR CLEAR 12/16/2015 2138   LABSPEC 1.023 12/16/2015 2138   PHURINE 6.0 12/16/2015 2138   GLUCOSEU >1000* 12/16/2015 2138   HGBUR NEGATIVE 12/16/2015 2138   BILIRUBINUR NEGATIVE 12/16/2015 2138   BILIRUBINUR neg 09/18/2015 1131   KETONESUR 15* 12/16/2015 2138   PROTEINUR NEGATIVE 12/16/2015 2138   PROTEINUR 30 09/18/2015 1131   UROBILINOGEN 0.2 09/18/2015 1131   UROBILINOGEN 0.2 12/11/2014 1522   NITRITE NEGATIVE 12/16/2015 2138   NITRITE neg 09/18/2015 1131   LEUKOCYTESUR NEGATIVE 12/16/2015 2138     Radiology Studies: Ct Abdomen Pelvis W Contrast  04/16/2016  CLINICAL DATA:  Patient with right upper quadrant pain radiating to the back. EXAM: CT ABDOMEN AND PELVIS WITH CONTRAST TECHNIQUE: Multidetector CT imaging of the  abdomen and pelvis was performed using the standard protocol following bolus administration of intravenous contrast. CONTRAST:  ISOVUE-300 IOPAMIDOL (ISOVUE-300) INJECTION 61% COMPARISON:  None. FINDINGS: Lower chest: Normal heart size. Dependent atelectasis and/or scarring within the bilateral lower lobes. No pleural effusion. Hepatobiliary: The liver is normal in size and contour. No focal hepatic lesions identified. Fatty deposition adjacent to the falciform ligament. Gallbladder is unremarkable. Pancreas: The pancreas is edematous. There is a large amount of surrounding fat stranding and fluid. The pancreatic parenchyma enhances without evidence for necrosis. Spleen: Unremarkable Adrenals/Urinary Tract: The adrenal glands are normal. Kidneys enhance symmetrically with contrast. No hydronephrosis. Urinary bladder is unremarkable. Focal atrophy inferior pole right kidney. Too small to characterize low-attenuation lesion interpolar region right kidney. Stomach/Bowel: Normal appendix. No evidence for bowel obstruction. Normal morphology to the stomach. No free intraperitoneal air. Vascular/Lymphatic: Normal caliber abdominal aorta. Circumaortic left renal vein. No retroperitoneal adenopathy. The splenic and portal veins are patent. Other: Uterus and adnexal structures are unremarkable. Musculoskeletal: Small fat containing ventral abdominal wall hernia (image 78; series 2). No aggressive or acute appearing  osseous lesions. IMPRESSION: Findings compatible with acute pancreatitis. Electronically Signed   By: Annia Belt M.D.   On: 04/16/2016 17:34   US Abdomen Limited Ruq  04/16/2016  CLINICAL DATA:  Right upper quadrant pain for 2 days EXAM: US ABDOMEN LIMITED - RIGHT UPPER QUADRANT COMPARISON:  12/11/2014 FINDINGS: Gallbladder: No gallstones or wall thickening visualized. No sonographic Murphy sign noted by sonographer. Common bile duct: Diameter: 3.4 mm. Liver: Mildly increased in echogenicity consistent  with fatty infiltration. IMPRESSION: Fatty liver.  No acute abnormality noted. Electronically Signed   By: Alcide Clever M.D.   On: 04/16/2016 15:17        Scheduled Meds: . cloNIDine  0.1 mg Oral BID  . enoxaparin (LOVENOX) injection  40 mg Subcutaneous Q24H  . folic acid  1 mg Oral Daily  . hydrochlorothiazide  25 mg Oral Daily  . insulin aspart  0-15 Units Subcutaneous TID WC  . insulin aspart  3 Units Subcutaneous TID WC  . insulin detemir  10 Units Subcutaneous Daily  . mometasone-formoterol  2 puff Inhalation BID  . multivitamin with minerals  1 tablet Oral Daily  . pantoprazole  40 mg Oral Daily  . polyethylene glycol  17 g Oral Daily  . [START ON 04/19/2016] predniSONE  20 mg Oral Q breakfast  . simvastatin  40 mg Oral q1800  . thiamine  100 mg Oral Daily   Or  . thiamine  100 mg Intravenous Daily  . venlafaxine XR  75 mg Oral Q breakfast   Continuous Infusions: . sodium chloride 1,000 mL (04/18/16 0115)     LOS: 2 days    Time spent: 68   Pleas Koch, MD [facility] (P) [phone]   If 7PM-7AM, please contact night-coverage [email or web address] Password [email or web address] 04/18/2016, 2:28 PM

## 2016-04-19 ENCOUNTER — Inpatient Hospital Stay (HOSPITAL_COMMUNITY): Payer: MEDICAID

## 2016-04-19 DIAGNOSIS — F1010 Alcohol abuse, uncomplicated: Secondary | ICD-10-CM

## 2016-04-19 LAB — COMPREHENSIVE METABOLIC PANEL
ALK PHOS: 54 U/L (ref 38–126)
ALT: 14 U/L (ref 14–54)
AST: 42 U/L — AB (ref 15–41)
Albumin: 2.3 g/dL — ABNORMAL LOW (ref 3.5–5.0)
Anion gap: 10 (ref 5–15)
BILIRUBIN TOTAL: 1.7 mg/dL — AB (ref 0.3–1.2)
BUN: 10 mg/dL (ref 6–20)
CALCIUM: 8 mg/dL — AB (ref 8.9–10.3)
CO2: 28 mmol/L (ref 22–32)
Chloride: 94 mmol/L — ABNORMAL LOW (ref 101–111)
Creatinine, Ser: 0.78 mg/dL (ref 0.44–1.00)
GFR calc Af Amer: >60 mL/min (ref >60)
GLUCOSE: 323 mg/dL — AB (ref 65–99)
POTASSIUM: 3.6 mmol/L (ref 3.5–5.1)
Sodium: 132 mmol/L — ABNORMAL LOW (ref 135–145)
TOTAL PROTEIN: 6.1 g/dL — AB (ref 6.5–8.1)

## 2016-04-19 LAB — CBC WITH DIFFERENTIAL/PLATELET
BASOS ABS: 0 10*3/uL (ref 0.0–0.1)
BASOS PCT: 0 %
EOS ABS: 0.3 10*3/uL (ref 0.0–0.7)
EOS PCT: 1 %
HCT: 33.9 % — ABNORMAL LOW (ref 36.0–46.0)
Hemoglobin: 11.8 g/dL — ABNORMAL LOW (ref 12.0–15.0)
LYMPHS PCT: 10 %
Lymphs Abs: 1.8 10*3/uL (ref 0.7–4.0)
MCH: 30.7 pg (ref 26.0–34.0)
MCHC: 34.8 g/dL (ref 30.0–36.0)
MCV: 88.3 fL (ref 78.0–100.0)
MONO ABS: 1.2 10*3/uL — AB (ref 0.1–1.0)
Monocytes Relative: 6 %
Neutro Abs: 14.9 10*3/uL — ABNORMAL HIGH (ref 1.7–7.7)
Neutrophils Relative %: 83 %
PLATELETS: 216 10*3/uL (ref 150–400)
RBC: 3.84 MIL/uL — AB (ref 3.87–5.11)
RDW: 12.8 % (ref 11.5–15.5)
WBC: 18.2 10*3/uL — AB (ref 4.0–10.5)

## 2016-04-19 LAB — PROTIME-INR
INR: 1.13 (ref 0.00–1.49)
Prothrombin Time: 14.7 seconds (ref 11.6–15.2)

## 2016-04-19 LAB — GLUCOSE, CAPILLARY
GLUCOSE-CAPILLARY: 306 mg/dL — AB (ref 65–99)
GLUCOSE-CAPILLARY: 287 mg/dL — AB (ref 65–99)
GLUCOSE-CAPILLARY: 268 mg/dL — AB (ref 65–99)
GLUCOSE-CAPILLARY: 234 mg/dL — AB (ref 65–99)

## 2016-04-19 MED ORDER — INSULIN DETEMIR 100 UNIT/ML ~~LOC~~ SOLN
10.0000 [IU] | Freq: Two times a day (BID) | SUBCUTANEOUS | Status: DC
  Administered 2016-04-19 – 2016-04-20 (×3): 10 [IU] via SUBCUTANEOUS
  Filled 2016-04-19 (×3): qty 0.1

## 2016-04-19 MED ORDER — OXYCODONE-ACETAMINOPHEN 7.5-325 MG PO TABS
1.0000 | ORAL_TABLET | ORAL | Status: DC | PRN
  Administered 2016-04-19 – 2016-04-21 (×6): 1 via ORAL
  Filled 2016-04-19 (×6): qty 1

## 2016-04-19 NOTE — Progress Notes (Signed)
PROGRESS NOTE    Carla Hicks  TMH:[id number] DOB: [birth year] DOA: 04/16/2016 PCP: Lora Paula, MD  Outpatient Specialists:     Brief Narrative:  45 y/o ? History of leaving AGAINST MEDICAL ADVICE Quit smoking 2013-also? Asthma/alpha-1 antitrypsin deficiency on chronic prednisone 20 mg daily Hypertension DM TY II+ nephropathy + neuropathy There is no weight on file to calculate BMI.  Chronic ethanolism Heart failure diastolic dysfunctionOSA    Assessment & Plan:  Pancreatitis, alcoholic/hyperTriglyceridemia SIRS Volume depletion with hypovolemic hyponatremia Lipase 1200 on admission-->500 now -Volume repletion saline 75 cc --> 125 cc/HR 4.29-->NSL 04/19/2016 -LDL not able to be calculated--?if >400 -start Crestor 40, started Gemfibrozil 600 bid -at present doesn't require insulin Gtt nor Pheresis -Rpt lipase in amt -4.30 improved and is now currently eating somewhat and I think her pancreatitis is slowly getting better -We will continue when necessary IV morphine but in an attempt to wean her off of IV pain meds we will place her on Percocet 7.5 instead of Norco.  -X-ray performed of abdomen 04/19/2016 Shows ileus not present and just distended bowel loops and expect that this will resolve with ambulation. 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 intrinsic insulin production and use of chr Prednisone -patient hesitant to scale back steroids to 10 tid -Hold glucoctrol 5 twice a day, metformin 500 twice a day -Sugars as high as 450 4.29 -Start levemir 10-->? to 10 BIdD given contued hyperglycemia and wgt > 100 kg -cont mod scale coverage for now Asthma/alpha 1 antitrypsin deficiency -Continue chronic steroids 20 mg daily -Continue  inhalers Symbicort 2 puffs every morning and then every 12 -Continue albuterol 0.5 every 4 when necessary wheeze   Hypokalemia -re-check labs am -resolving   Alcohol abuse -Patient drank a six pack and then a 40 of beer PTA -CIWA neg-Dc/ Ativan protocol 4.29 -note Bili trend ? slightly   DVT prophylaxis: Lovenox Code Status: Full Family Communication: no family +.  Patient awake and alert and competent to understand whats going on Disposition Plan: home when stable    Subjective:  Having less abdominal pain currently  No nausea no vomiting No chest pain Still distended in the abdomen and has not had much stool   Objective: Filed Vitals:   04/18/16 2106 04/19/16 0558 04/19/16 0833 04/19/16 1301  BP: 148/85 143/77  139/87  Pulse: 115 108  104  Temp: 99.8 F (37.7 C) 98.6 F (37 C)  99.2 F (37.3 C)  TempSrc: Oral Oral  Oral  Resp: 19 19  18   SpO2: 96% 95% 96% 92%    Intake/Output Summary (Last 24 hours) at 04/19/16 1805 Last data filed at 04/19/16 1300  Gross per 24 hour  Intake  919.9 ml  Output   3500 ml  Net -2580.1 ml   There were no vitals filed for this visit.  Examination:  General exam: calm Respiratory system: Clear to auscultation.  Cardiovascular system: S1 & S2 heard, RRR. No JVD, murmurs, rubs, gallops or clicks. No pedal edema. Gastrointestinal system: Abdomen is slightly distended, no rebound no gaurd Central nervous system: Alert and oriented. No focal neurological deficits. Extremities: Symmetric 5 x 5 power. Skin: No rashes, lesions or ulcers Psychiatry: Judgement and insight appear normal. Mood & affect appropriate.     Data Reviewed: I have personally reviewed  following labs and imaging studies  CBC:  Recent Labs Lab 04/16/16 1354 04/17/16 0333 04/19/16 0349  WBC 9.5 10.8* 18.2*  NEUTROABS  --   --  14.9*  HGB 15.6* 15.9* 11.8*  HCT 43.2 44.5 33.9*  MCV 87.3 88.8 88.3  PLT 343 300 216   Basic Metabolic Panel:  Recent  Labs Lab 04/16/16 1354 04/17/16 0817 04/18/16 0830 04/19/16 0349  NA 137 138 128* 132*  K 3.0* 3.5 4.1 3.6  CL 95* 102 93* 94*  CO2 GLUCOSE 418* 254* 397* 323*  BUN CREATININE 0.87 0.71 0.93 0.78  CALCIUM 9.9 8.8* 7.7* 8.0*   GFR: CrCl cannot be calculated (Unknown ideal weight.). Liver Function Tests:  Recent Labs Lab 04/16/16 1354 04/17/16 0817 04/18/16 0830 04/19/16 0349  AST 24 21 42* 42*  ALT ALKPHOS 66 58 53 54  BILITOT 0.4 0.6 1.5* 1.7*  PROT 7.9 6.8 6.1* 6.1*  ALBUMIN 3.6 3.1* 2.6* 2.3*    Recent Labs Lab 04/16/16 1354 04/17/16 0817 04/18/16 0830  LIPASE 1721* 564* 537*   No results for input(s): AMMONIA in the last 168 hours. Coagulation Profile:  Recent Labs Lab 04/19/16 0349  INR 1.13   Cardiac Enzymes: No results for input(s): CKTOTAL, CKMB, CKMBINDEX, TROPONINI in the last 168 hours. BNP (last 3 results) No results for input(s): PROBNP in the last 8760 hours. HbA1C: No results for input(s): HGBA1C in the last 72 hours. CBG:  Recent Labs Lab 04/18/16 1637 04/18/16 2108 04/19/16 0727 04/19/16 1138 04/19/16 1657  GLUCAP 231* 245* 306* 287* 268*   Lipid Profile:  Recent Labs  04/17/16 0817  CHOL 177  HDL 61  LDLCALC UNABLE TO CALCULATE IF TRIGLYCERIDE OVER 400 mg/dL  TRIG 161*  CHOLHDL 2.9   Thyroid Function Tests: No results for input(s): TSH, T4TOTAL, FREET4, T3FREE, THYROIDAB in the last 72 hours. Anemia Panel: No results for input(s): VITAMINB12, FOLATE, FERRITIN, TIBC, IRON, RETICCTPCT in the last 72 hours. Urine analysis:    Component Value Date/Time   COLORURINE YELLOW 12/16/2015 2138   APPEARANCEUR CLEAR 12/16/2015 2138   LABSPEC 1.023 12/16/2015 2138   PHURINE 6.0 12/16/2015 2138   GLUCOSEU >1000* 12/16/2015 2138   HGBUR NEGATIVE 12/16/2015 2138   BILIRUBINUR NEGATIVE 12/16/2015 2138   BILIRUBINUR neg 09/18/2015 1131   KETONESUR 15* 12/16/2015 2138   PROTEINUR NEGATIVE  12/16/2015 2138   PROTEINUR 30 09/18/2015 1131   UROBILINOGEN 0.2 09/18/2015 1131   UROBILINOGEN 0.2 12/11/2014 1522   NITRITE NEGATIVE 12/16/2015 2138   NITRITE neg 09/18/2015 1131   LEUKOCYTESUR NEGATIVE 12/16/2015 2138     Radiology Studies: Dg Abd 1 View  04/19/2016  CLINICAL DATA:  Abdominal distention EXAM: ABDOMEN - 1 VIEW COMPARISON:  CT abdomen pelvis dated 04/16/2016 FINDINGS: Nonobstructive bowel gas pattern. Mildly prominent but nondilated loops of small bowel in the left lower abdomen, nonspecific. Mild degenerative changes the lower lumbar spine. IMPRESSION: Unremarkable abdominal radiograph. Electronically Signed   By: Charline Bills M.D.   On: 04/19/2016 16:03        Scheduled Meds: . atorvastatin  20 mg Oral q1800  . cloNIDine  0.1 mg Oral BID  . enoxaparin (LOVENOX) injection  40 mg Subcutaneous Q24H  . folic acid  1 mg Oral Daily  . gemfibrozil  600 mg Oral BID AC  . insulin aspart  0-15 Units Subcutaneous TID WC  . insulin aspart  3 Units Subcutaneous TID  WC  . insulin detemir  10 Units Subcutaneous BID  . mometasone-formoterol  2 puff Inhalation BID  . multivitamin with minerals  1 tablet Oral Daily  . pantoprazole  40 mg Oral Daily  . polyethylene glycol  17 g Oral Daily  . predniSONE  20 mg Oral Q breakfast  . thiamine  100 mg Oral Daily   Or  . thiamine  100 mg Intravenous Daily  . venlafaxine XR  75 mg Oral Q breakfast   Continuous Infusions:     LOS: 3 days    Time spent: 68   Pleas Koch, MD [facility] ([facility]   If 7PM-7AM, please contact night-coverage [email or web address] Password [email or web address] 04/19/2016, 6:05 PM

## 2016-04-20 LAB — COMPREHENSIVE METABOLIC PANEL
ALBUMIN: 2.3 g/dL — AB (ref 3.5–5.0)
ALK PHOS: 59 U/L (ref 38–126)
ALT: 13 U/L — AB (ref 14–54)
AST: 29 U/L (ref 15–41)
Anion gap: 10 (ref 5–15)
BUN: 6 mg/dL (ref 6–20)
CALCIUM: 8.6 mg/dL — AB (ref 8.9–10.3)
CO2: 30 mmol/L (ref 22–32)
CREATININE: 0.58 mg/dL (ref 0.44–1.00)
Chloride: 93 mmol/L — ABNORMAL LOW (ref 101–111)
GFR calc Af Amer: >60 mL/min (ref >60)
GFR calc non Af Amer: >60 mL/min (ref >60)
GLUCOSE: 254 mg/dL — AB (ref 65–99)
Potassium: 2.7 mmol/L — CL (ref 3.5–5.1)
SODIUM: 133 mmol/L — AB (ref 135–145)
Total Bilirubin: 0.7 mg/dL (ref 0.3–1.2)
Total Protein: 6.2 g/dL — ABNORMAL LOW (ref 6.5–8.1)

## 2016-04-20 LAB — GLUCOSE, CAPILLARY
GLUCOSE-CAPILLARY: 321 mg/dL — AB (ref 65–99)
Glucose-Capillary: 267 mg/dL — ABNORMAL HIGH (ref 65–99)
Glucose-Capillary: 245 mg/dL — ABNORMAL HIGH (ref 65–99)

## 2016-04-20 LAB — LIPASE, BLOOD: LIPASE: 73 U/L — AB (ref 11–51)

## 2016-04-20 MED ORDER — SENNOSIDES-DOCUSATE SODIUM 8.6-50 MG PO TABS
2.0000 | ORAL_TABLET | Freq: Every day | ORAL | Status: DC
  Administered 2016-04-20 – 2016-04-21 (×2): 2 via ORAL
  Filled 2016-04-20 (×2): qty 2

## 2016-04-20 MED ORDER — INSULIN DETEMIR 100 UNIT/ML ~~LOC~~ SOLN
14.0000 [IU] | Freq: Two times a day (BID) | SUBCUTANEOUS | Status: DC
  Administered 2016-04-20: 14 [IU] via SUBCUTANEOUS
  Filled 2016-04-20 (×2): qty 0.14

## 2016-04-20 MED ORDER — KETOROLAC TROMETHAMINE 30 MG/ML IJ SOLN
30.0000 mg | Freq: Four times a day (QID) | INTRAMUSCULAR | Status: DC | PRN
  Administered 2016-04-20 – 2016-04-21 (×3): 30 mg via INTRAVENOUS
  Filled 2016-04-20 (×3): qty 1

## 2016-04-20 MED ORDER — POTASSIUM CHLORIDE CRYS ER 20 MEQ PO TBCR
40.0000 meq | EXTENDED_RELEASE_TABLET | Freq: Two times a day (BID) | ORAL | Status: DC
  Administered 2016-04-20 – 2016-04-21 (×3): 40 meq via ORAL
  Filled 2016-04-20 (×3): qty 2

## 2016-04-20 MED ORDER — POLYETHYLENE GLYCOL 3350 17 G PO PACK
17.0000 g | PACK | Freq: Every day | ORAL | Status: DC
  Administered 2016-04-21: 17 g via ORAL
  Filled 2016-04-20: qty 1

## 2016-04-20 NOTE — Progress Notes (Signed)
Inpatient Diabetes Program Recommendations  AACE/ADA: New Consensus Statement on Inpatient Glycemic Control (2015)  Target Ranges:  Prepandial:   less than 140 mg/dL      Peak postprandial:   less than 180 mg/dL (1-2 hours)      Critically ill patients:  140 - 180 mg/dL   Review of Glycemic Control Results for MEEKAH, MATH (MRN [id number]) as of 04/20/2016 09:39  Ref. Range 04/19/2016 07:27 04/19/2016 11:38 04/19/2016 16:57 04/19/2016 20:45 04/20/2016 07:37  Glucose-Capillary Latest Ref Range: 65-99 mg/dL 409 (H) 811 (H) 914 (H) 234 (H) 267 (H)   Diabetes history: Diabetes Type 2 Outpatient Diabetes medications: Glipizide 5 mg bid + Metformin 500 mg bid + Prednisone 20 mg q d Current orders for Inpatient glycemic control: Levemir 10 units bid + Novolog 3 units meal coverage tid + Novolog Correction scale moderate 0-15 units tid  Inpatient Diabetes Program Recommendations:  Please consider increase of meal coverage to 5 units tid (hold if patient eats < 50 %) and add Novolog hs coverage 0-5 units. Also consider A1c.  Thank you, Billy Fischer. Sonnie Pawloski, RN, MSN, CDE Inpatient Glycemic Control Team Team Pager [phone] (8am-5pm) 04/20/2016 9:44 AM

## 2016-04-20 NOTE — Progress Notes (Signed)
PROGRESS NOTE    Carla Hicks  ZOX:[id number] DOB: 04-01-1971 DOA: 04/16/2016 PCP: Lora Paula, MD  Outpatient Specialists:     Brief Narrative:  45 y/o ? History of leaving AGAINST MEDICAL ADVICE Quit smoking 2013-also? Asthma/alpha-1 antitrypsin deficiency on chronic prednisone 20 mg daily Hypertension DM TY II+ nephropathy + neuropathy There is no weight on file to calculate BMI.  Chronic ethanolism Heart failure diastolic dysfunctionOSA  admitted initially for 27 2017 with severe abdominal pain, found to have lipase in the 2000 range elevated LFTs and clinical pancreatitis which was thought to be multifactorial but most likely secondary to hypertriglyceridemia     Assessment & Plan:  Pancreatitis, alcoholic/hyperTriglyceridemia SIRS Volume depletion with hypovolemic hyponatremia Lipase 1200 on admission-->500 now -Volume repletion saline 75 cc --> 125 cc/HR 4.29-->NSL 04/20/2016 -LDL not able to be calculated--?if >400 -start continue atorvastatin 20, started Gemfibrozil 600 bid -at present doesn't require insulin Gtt nor Pheresis -Rpt lipase in amt -4.30 improved and is now currently eating somewhat and I think her pancreatitis is slowly getting better -discontinued on 5/1 IV morphine- Percocet 7.5 instead of Norco. Toradol IV for breakthrough -X-ray performed of abdomen 04/20/2016 Shows ileus not present and just distended bowel loops and expect that this will resolve with ambulation. 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 intrinsic insulin production and use of chr Prednisone -patient hesitant to scale back steroids to 10 tid -Hold glucoctrol 5 twice a day, metformin 500 twice a day -Sugars as high as  450 4.29 -Start levemir 10-->? to 10 BId-->14u   U BID given contued hyperglycemia and wgt > 100 kg -cont mod scale coverage for now Asthma/alpha 1 antitrypsin deficiency -Continue chronic steroids 20 mg daily -Continue inhalers Symbicort 2 puffs every morning and then every 12 -Continue albuterol 0.5 every 4 when necessary wheeze   Hypokalemia -re-check labs am -resolving   Alcohol abuse -Patient drank a six pack and then a 40 of beer PTA -CIWA neg-Dc/ Ativan protocol 4.29 -note Bili trend ? slightly Hypokalemia -replace with Kdur 40 bid -check mag in am   DVT prophylaxis: Lovenox Code Status: Full Family Communication: no family +.   Disposition Plan: home when stable    Subjective:  lhaving pain but tolerating full liquids and hesitant to try diet I have asked her to progress her diet a little bit and walk around. I suspect large component of her pain is secondary to abdominal distention from not passing much stool and gas-she has no clinically significant  Objective: Filed Vitals:   04/19/16 0833 04/19/16 1301 04/19/16 2048 04/20/16 0616  BP:  139/87 125/84 138/85  Pulse:  104 98 95  Temp:  99.2 F (37.3 C) 98.4 F (36.9 C) 98.4 F (36.9 C)  TempSrc:  Oral Oral Oral  Resp:  SpO2: 96% 92% 98% 99%    Intake/Output Summary (Last 24 hours) at 04/20/16 1347 Last data filed at 04/20/16 1327  Gross per 24 hour  Intake    120 ml  Output      0 ml  Net    120 ml   There were no vitals filed for this visit.  Examination:  General exam: calm Respiratory system: Clear to auscultation.  Cardiovascular system: S1 & S2 heard, RRR. No  JVD, murmurs, rubs, gallops or clicks. No pedal edema. Gastrointestinal system: Abdomen is less swollen Central nervous system: Alert and oriented. No focal neurological deficits. Extremities: Symmetric 5 x 5 power. Skin: No rashes, lesions or ulcers Psychiatry: Judgement and insight appear normal. Mood & affect appropriate.       Data Reviewed: I have personally reviewed following labs and imaging studies  CBC:  Recent Labs Lab 04/16/16 1354 04/17/16 0333 04/19/16 0349  WBC 9.5 10.8* 18.2*  NEUTROABS  --   --  14.9*  HGB 15.6* 15.9* 11.8*  HCT 43.2 44.5 33.9*  MCV 87.3 88.8 88.3  PLT 343 300 216   Basic Metabolic Panel:  Recent Labs Lab 04/16/16 1354 04/17/16 0817 04/18/16 0830 04/19/16 0349 04/20/16 1218  NA 137 138 128* 132* 133*  K 3.0* 3.5 4.1 3.6 2.7*  CL 95* 102 93* 94* 93*  CO2 26 24 23 28 30   GLUCOSE 418* 254* 397* 323* 254*  BUN 10 10 16 10 6   CREATININE 0.87 0.71 0.93 0.78 0.58  CALCIUM 9.9 8.8* 7.7* 8.0* 8.6*   GFR: CrCl cannot be calculated (Unknown ideal weight.). Liver Function Tests:  Recent Labs Lab 04/16/16 1354 04/17/16 0817 04/18/16 0830 04/19/16 0349 04/20/16 1218  AST 24 21 42* 42* 29  ALT 20 14 14 14  13*  ALKPHOS 66 58 53 54 59  BILITOT 0.4 0.6 1.5* 1.7* 0.7  PROT 7.9 6.8 6.1* 6.1* 6.2*  ALBUMIN 3.6 3.1* 2.6* 2.3* 2.3*    Recent Labs Lab 04/16/16 1354 04/17/16 0817 04/18/16 0830 04/20/16 1218  LIPASE 1721* 564* 537* 73*   No results for input(s): AMMONIA in the last 168 hours. Coagulation Profile:  Recent Labs Lab 04/19/16 0349  INR 1.13   Cardiac Enzymes: No results for input(s): CKTOTAL, CKMB, CKMBINDEX, TROPONINI in the last 168 hours. BNP (last 3 results) No results for input(s): PROBNP in the last 8760 hours. HbA1C: No results for input(s): HGBA1C in the last 72 hours. CBG:  Recent Labs Lab 04/19/16 1138 04/19/16 1657 04/19/16 2045 04/20/16 0737 04/20/16 1146  GLUCAP 287* 268* 234* 267* 245*   Lipid Profile: No results for input(s): CHOL, HDL, LDLCALC, TRIG, CHOLHDL, LDLDIRECT in the last 72 hours. Thyroid Function Tests: No results for input(s): TSH, T4TOTAL, FREET4, T3FREE, THYROIDAB in the last 72 hours. Anemia Panel: No results for input(s): VITAMINB12, FOLATE, FERRITIN, TIBC, IRON, RETICCTPCT in the last 72  hours. Urine analysis:    Component Value Date/Time   COLORURINE YELLOW 12/16/2015 2138   APPEARANCEUR CLEAR 12/16/2015 2138   LABSPEC 1.023 12/16/2015 2138   PHURINE 6.0 12/16/2015 2138   GLUCOSEU >1000* 12/16/2015 2138   HGBUR NEGATIVE 12/16/2015 2138   BILIRUBINUR NEGATIVE 12/16/2015 2138   BILIRUBINUR neg 09/18/2015 1131   KETONESUR 15* 12/16/2015 2138   PROTEINUR NEGATIVE 12/16/2015 2138   PROTEINUR 30 09/18/2015 1131   UROBILINOGEN 0.2 09/18/2015 1131   UROBILINOGEN 0.2 12/11/2014 1522   NITRITE NEGATIVE 12/16/2015 2138   NITRITE neg 09/18/2015 1131   LEUKOCYTESUR NEGATIVE 12/16/2015 2138     Radiology Studies: Dg Abd 1 View  04/19/2016  CLINICAL DATA:  Abdominal distention EXAM: ABDOMEN - 1 VIEW COMPARISON:  CT abdomen pelvis dated 04/16/2016 FINDINGS: Nonobstructive bowel gas pattern. Mildly prominent but nondilated loops of small bowel in the left lower abdomen, nonspecific. Mild degenerative changes the lower lumbar spine. IMPRESSION: Unremarkable abdominal radiograph. Electronically Signed   By: Charline Bills M.D.   On: 04/19/2016 16:03  Scheduled Meds: . atorvastatin  20 mg Oral q1800  . cloNIDine  0.1 mg Oral BID  . enoxaparin (LOVENOX) injection  40 mg Subcutaneous Q24H  . folic acid  1 mg Oral Daily  . gemfibrozil  600 mg Oral BID AC  . insulin aspart  0-15 Units Subcutaneous TID WC  . insulin aspart  3 Units Subcutaneous TID WC  . insulin detemir  14 Units Subcutaneous BID  . mometasone-formoterol  2 puff Inhalation BID  . multivitamin with minerals  1 tablet Oral Daily  . pantoprazole  40 mg Oral Daily  . polyethylene glycol  17 g Oral Daily  . potassium chloride  40 mEq Oral BID  . predniSONE  20 mg Oral Q breakfast  . senna-docusate  2 tablet Oral Daily  . thiamine  100 mg Oral Daily   Or  . thiamine  100 mg Intravenous Daily  . venlafaxine XR  75 mg Oral Q breakfast   Continuous Infusions:     LOS: 4 days    Time spent:  108   Pleas Koch, MD [facility] ([facility]   If 7PM-7AM, please contact night-coverage [email or web address] Password [email or web address] 04/20/2016, 1:47 PM

## 2016-04-21 DIAGNOSIS — E876 Hypokalemia: Secondary | ICD-10-CM

## 2016-04-21 DIAGNOSIS — I10 Essential (primary) hypertension: Secondary | ICD-10-CM

## 2016-04-21 LAB — BASIC METABOLIC PANEL
ANION GAP: 9 (ref 5–15)
BUN: 10 mg/dL (ref 6–20)
CALCIUM: 8.2 mg/dL — AB (ref 8.9–10.3)
CO2: 27 mmol/L (ref 22–32)
Chloride: 93 mmol/L — ABNORMAL LOW (ref 101–111)
Creatinine, Ser: 0.69 mg/dL (ref 0.44–1.00)
GFR calc Af Amer: >60 mL/min (ref >60)
Glucose, Bld: 334 mg/dL — ABNORMAL HIGH (ref 65–99)
POTASSIUM: 3.4 mmol/L — AB (ref 3.5–5.1)
SODIUM: 129 mmol/L — AB (ref 135–145)

## 2016-04-21 LAB — GLUCOSE, CAPILLARY
GLUCOSE-CAPILLARY: 329 mg/dL — AB (ref 65–99)
Glucose-Capillary: 227 mg/dL — ABNORMAL HIGH (ref 65–99)
Glucose-Capillary: 287 mg/dL — ABNORMAL HIGH (ref 65–99)

## 2016-04-21 LAB — MAGNESIUM: MAGNESIUM: 1.8 mg/dL (ref 1.7–2.4)

## 2016-04-21 MED ORDER — INSULIN PEN NEEDLE 29G X 13MM MISC: 1.0000 | Freq: Three times a day (TID) | Status: DC

## 2016-04-21 MED ORDER — INSULIN DETEMIR 100 UNIT/ML ~~LOC~~ SOLN: 18.0000 [IU] | Freq: Two times a day (BID) | SUBCUTANEOUS | Status: DC

## 2016-04-21 MED ORDER — GLIPIZIDE 5 MG PO TABS: 5.0000 mg | ORAL_TABLET | Freq: Two times a day (BID) | ORAL | Status: DC

## 2016-04-21 MED ORDER — PREDNISONE 20 MG PO TABS: 20.0000 mg | ORAL_TABLET | Freq: Every day | ORAL | Status: DC

## 2016-04-21 MED ORDER — BUDESONIDE-FORMOTEROL FUMARATE 80-4.5 MCG/ACT IN AERO: INHALATION_SPRAY | RESPIRATORY_TRACT | Status: DC

## 2016-04-21 MED ORDER — "INSULIN SYRINGE 29G X 1/2"" 1 ML MISC": 1.0000 | Freq: Three times a day (TID) | Status: DC

## 2016-04-21 MED ORDER — INSULIN DETEMIR 100 UNIT/ML ~~LOC~~ SOLN
18.0000 [IU] | Freq: Two times a day (BID) | SUBCUTANEOUS | Status: DC
  Administered 2016-04-21: 18 [IU] via SUBCUTANEOUS
  Filled 2016-04-21: qty 0.18

## 2016-04-21 MED ORDER — CLONIDINE HCL 0.1 MG PO TABS: 0.1000 mg | ORAL_TABLET | Freq: Two times a day (BID) | ORAL | Status: DC

## 2016-04-21 MED ORDER — ATORVASTATIN CALCIUM 20 MG PO TABS: 20.0000 mg | ORAL_TABLET | Freq: Every day | ORAL | Status: DC

## 2016-04-21 MED ORDER — GEMFIBROZIL 600 MG PO TABS: 600.0000 mg | ORAL_TABLET | Freq: Two times a day (BID) | ORAL | Status: AC

## 2016-04-21 MED ORDER — METFORMIN HCL 500 MG PO TABS: 500.0000 mg | ORAL_TABLET | Freq: Two times a day (BID) | ORAL | Status: DC

## 2016-04-21 MED ORDER — OXYCODONE-ACETAMINOPHEN 7.5-325 MG PO TABS: 1.0000 | ORAL_TABLET | ORAL | Status: DC | PRN

## 2016-04-21 MED FILL — metFORMIN HCL 500 MG TABS: 500 | 30 days supply | Qty: 60 | Fill #0

## 2016-04-21 MED FILL — predniSONE 20 MG TABS: 20 | 30 days supply | Qty: 30 | Fill #0

## 2016-04-21 MED FILL — glipiZIDE 5 MG TABS: 5 | 30 days supply | Qty: 60 | Fill #0

## 2016-04-21 MED FILL — GEMFIBROZIL 600 MG TABLET: 600 | 30 days supply | Qty: 60 | Fill #0

## 2016-04-21 MED FILL — !LEVEMIR FLEXPEN 100UNITS/M: 100U/ML (3) | 16 days supply | Qty: 6 | Fill #0

## 2016-04-21 MED FILL — ATORVASTATIN 20 MG TABLET: 20 | 30 days supply | Qty: 30 | Fill #0

## 2016-04-21 MED FILL — ?CLONIDINE HCL 0.1 MG TABLE: 0.1 MG | 30 days supply | Qty: 60 | Fill #0

## 2016-04-21 MED FILL — ?HYDROCHLOROTHIAZIDE 25 MG: 25 MG | 30 days supply | Qty: 30 | Fill #5

## 2016-04-21 MED FILL — SYMBICORT 80-4.5 MCG INH: 80-4.5 | 30 days supply | Qty: 10 | Fill #0

## 2016-04-21 NOTE — Discharge Summary (Signed)
Physician Discharge Summary  Carla Hicks UEA:[id number] DOB: 1971-04-06 DOA: 04/16/2016  PCP: Lora Paula, MD  Admit date: 04/16/2016 Discharge date: 04/21/2016  Time spent: 45 minutes  Recommendations for Outpatient Follow-up:  1. Needs OP follow up with PCP 2. Recommend OP f/u with Dr. Sherene Sires to taper steroids down further she has alpha-1 antitrypsin deficiency and is hesitant to taper down off of her chronic prednisone 3. Needs insulin Levemir 18 u bid 4. counseled re: adherence to a low fat low salt diet and we have given her prescriptions on this admission to help control her hypertriglyceridemia--please note substitution of simvastatin to atorvastatin and gemfibrozil addition this admission 5. I would recommend an outpatient referral to bariatric surgery  Discharge Diagnoses:  Principal Problem:   Pancreatitis Active Problems:   Essential hypertension, benign   Controlled type 2 diabetes mellitus without complication, without long-term current use of insulin (HCC)   Hypokalemia   Alcohol abuse   Pancreatitis, acute   Discharge Condition: Fair  Diet recommendation: Fat controlled low-fat and low triglyceride diet with a bland diet for the next 7 days  There were no vitals filed for this visit.   Brief Narrative:  45 y/o ? History of leaving AGAINST MEDICAL ADVICE Quit smoking 2013-also? Asthma/alpha-1 antitrypsin deficiency on chronic prednisone 20 mg daily Hypertension  DM TY II+ nephropathy + neuropathy  There is no weight on file to calculate BMI.  Chronic ethanolism Heart failure diastolic dysfunction OSA  admitted initially for 27 2017 with severe abdominal pain, found to have lipase in the 2000 range elevated LFTs and clinical pancreatitis which was thought to be multifactorial but most likely secondary to hypertriglyceridemia    Hospital Course:   Pancreatitis, alcoholic/hyperTriglyceridemia SIRS Volume depletion with hypovolemic  hyponatremia Lipase 1200 on admission-->500 now  -Volume repletion saline 75 cc --> 125 cc/HR 4.29-->NSL 04/20/2016 -LDL not able to be calculated--?if >400 -start continue atorvastatin 20, started Gemfibrozil 600 bid -at present doesn't require insulin Gtt nor Pheresis -Rpt lipase in amt -4.30 improved and is now currently eating somewhat and I think her pancreatitis is slowly getting better -discontinued on 5/1 IV morphine- Percocet 7.5 instead of Norco. Toradol IV for breakthrough -X-ray performed of abdomen 04/20/2016 Shows ileus not present and just distended bowel loops and expect that this will resolve with ambulation. And increasing diet -lipase now down to 70  Essential hypertension, benign -Hold HCTZ going forward 2/2 to Hyponatremia -Htn Only moderately controlled -Cont Clonidine 0.1 started this hospital stay Uncontrolled type 2 diabetes mellitus + diabetic nephropathy/neuropathy  -exacerbated by Pancreatitis with ? intrinsic insulin production and use of chr Prednisone -patient hesitant to scale back steroids to 10 tid -resume glucoctrol 5 twice a day, metformin 500 twice a day -Sugars as high as 450 4.29--terrending 2-300 rang eon d/c -poorly compliant with diabetic diet -Start levemir 10-->? to 10 BId-->14u--->18 units on discharge -Insulin given bid 2/2 wgt > 100 kg --Would not cover with short acting as his on orals at home Asthma/alpha 1 antitrypsin deficiency  -Continue chronic steroids 20 mg daily  -Continue inhalers Symbicort 2 puffs every morning and then every 12  -Continue albuterol 0.5 every 4 when necessary wheeze   Hypokalemia -re-check labs am -resolving  Alcohol abuse -Patient drank a six pack and then a 40 of beer PTA  -CIWA neg-Dc/ Ativan protocol 4.29 -note Bili trend ? slightly Hypokalemia -replaced with Kdur 40 bid -Not needed on day of discharge   Discharge Exam: Filed Vitals:  04/21/16 0545 04/21/16 0956  BP: 137/90 127/76  Pulse:  88 88  Temp: 97.5 F (36.4 C) 98.1 F (36.7 C)  Resp: 18 18    General: EOMI NCAT pleasant no pain Cardiovascular: s1 s2 no m/r/g Respiratory: clear n added sound  Discharge Instructions   Discharge Instructions    Diet - low sodium heart healthy    Complete by:  As directed      Increase activity slowly    Complete by:  As directed           Current Discharge Medication List    START taking these medications   Details  atorvastatin (LIPITOR) 20 MG tablet Take 1 tablet (20 mg total) by mouth daily at 6 PM. Qty: 30 tablet, Refills: 0    cloNIDine (CATAPRES) 0.1 MG tablet Take 1 tablet (0.1 mg total) by mouth 2 (two) times daily. Qty: 60 tablet, Refills: 11    gemfibrozil (LOPID) 600 MG tablet Take 1 tablet (600 mg total) by mouth 2 (two) times daily before a meal. Qty: 60 tablet, Refills: 12    insulin detemir (LEVEMIR) 100 UNIT/ML injection Inject 0.18 mLs (18 Units total) into the skin 2 (two) times daily. Qty: 10 mL, Refills: 11    oxyCODONE-acetaminophen (PERCOCET) 7.5-325 MG tablet Take 1 tablet by mouth every 4 (four) hours as needed for moderate pain. Qty: 30 tablet, Refills: 0      CONTINUE these medications which have CHANGED   Details  budesonide-formoterol (SYMBICORT) 80-4.5 MCG/ACT inhaler Take 2 puffs first thing in am and then another 2 puffs about 12 hours later. Qty: 1 Inhaler, Refills: 11    glipiZIDE (GLUCOTROL) 5 MG tablet Take 1 tablet (5 mg total) by mouth 2 (two) times daily before a meal. Qty: 60 tablet, Refills: 3    metFORMIN (GLUCOPHAGE) 500 MG tablet Take 1 tablet (500 mg total) by mouth 2 (two) times daily with a meal. Needs office visit for more refills Qty: 60 tablet, Refills: 3    predniSONE (DELTASONE) 20 MG tablet Take 1 tablet (20 mg total) by mouth daily with breakfast. Qty: 30 tablet, Refills: 3      CONTINUE these medications which have NOT CHANGED   Details  acetaminophen-codeine (TYLENOL #3) 300-30 MG tablet Take 1  tablet by mouth every 8 (eight) hours as needed for moderate pain. Qty: 60 tablet, Refills: 2   Associated Diagnoses: Chronic pain syndrome    albuterol (PROVENTIL) (5 MG/ML) 0.5% nebulizer solution Take 0.5 mLs (2.5 mg total) by nebulization every 4 (four) hours as needed for wheezing or shortness of breath. Sob Qty: 20 mL, Refills: 5    diclofenac sodium (VOLTAREN) 1 % GEL Apply 1 application topically 4 (four) times daily. Qty: 100 g, Refills: 0    famotidine (PEPCID) 20 MG tablet One at bedtime Qty: 30 tablet, Refills: 2    gabapentin (NEURONTIN) 300 MG capsule Take 1 capsule (300 mg total) by mouth 2 (two) times daily. Qty: 60 capsule, Refills: 5    Multiple Vitamin (MULTIVITAMIN WITH MINERALS) TABS tablet Take 1 tablet by mouth daily.    pantoprazole (PROTONIX) 40 MG tablet Take 1 tablet (40 mg total) by mouth daily. Take 30-60 min before first meal of the day Qty: 30 tablet, Refills: 2    potassium chloride SA (K-DUR,KLOR-CON) 20 MEQ tablet Take 1 tablet (20 mEq total) by mouth daily. Qty: 30 tablet, Refills: 0    Respiratory Therapy Supplies (FLUTTER) DEVI Use as directed Qty: 1 each,  Refills: 0      STOP taking these medications     hydrochlorothiazide (HYDRODIURIL) 25 MG tablet      simvastatin (ZOCOR) 40 MG tablet      venlafaxine XR (EFFEXOR XR) 75 MG 24 hr capsule          The results of significant diagnostics from this hospitalization (including imaging, microbiology, ancillary and laboratory) are listed below for reference.    Significant Diagnostic Studies: Dg Abd 1 View  04/19/2016  CLINICAL DATA:  Abdominal distention EXAM: ABDOMEN - 1 VIEW COMPARISON:  CT abdomen pelvis dated 04/16/2016 FINDINGS: Nonobstructive bowel gas pattern. Mildly prominent but nondilated loops of small bowel in the left lower abdomen, nonspecific. Mild degenerative changes the lower lumbar spine. IMPRESSION: Unremarkable abdominal radiograph. Electronically Signed   By: Charline Bills M.D.   On: 04/19/2016 16:03   Ct Abdomen Pelvis W Contrast  04/16/2016  CLINICAL DATA:  Patient with right upper quadrant pain radiating to the back. EXAM: CT ABDOMEN AND PELVIS WITH CONTRAST TECHNIQUE: Multidetector CT imaging of the abdomen and pelvis was performed using the standard protocol following bolus administration of intravenous contrast. CONTRAST:  ISOVUE-300 IOPAMIDOL (ISOVUE-300) INJECTION 61% COMPARISON:  None. FINDINGS: Lower chest: Normal heart size. Dependent atelectasis and/or scarring within the bilateral lower lobes. No pleural effusion. Hepatobiliary: The liver is normal in size and contour. No focal hepatic lesions identified. Fatty deposition adjacent to the falciform ligament. Gallbladder is unremarkable. Pancreas: The pancreas is edematous. There is a large amount of surrounding fat stranding and fluid. The pancreatic parenchyma enhances without evidence for necrosis. Spleen: Unremarkable Adrenals/Urinary Tract: The adrenal glands are normal. Kidneys enhance symmetrically with contrast. No hydronephrosis. Urinary bladder is unremarkable. Focal atrophy inferior pole right kidney. Too small to characterize low-attenuation lesion interpolar region right kidney. Stomach/Bowel: Normal appendix. No evidence for bowel obstruction. Normal morphology to the stomach. No free intraperitoneal air. Vascular/Lymphatic: Normal caliber abdominal aorta. Circumaortic left renal vein. No retroperitoneal adenopathy. The splenic and portal veins are patent. Other: Uterus and adnexal structures are unremarkable. Musculoskeletal: Small fat containing ventral abdominal wall hernia (image 78; series 2). No aggressive or acute appearing osseous lesions. IMPRESSION: Findings compatible with acute pancreatitis. Electronically Signed   By: Annia Belt M.D.   On: 04/16/2016 17:34   US Abdomen Limited Ruq  04/16/2016  CLINICAL DATA:  Right upper quadrant pain for 2 days EXAM: US ABDOMEN LIMITED -  RIGHT UPPER QUADRANT COMPARISON:  12/11/2014 FINDINGS: Gallbladder: No gallstones or wall thickening visualized. No sonographic Murphy sign noted by sonographer. Common bile duct: Diameter: 3.4 mm. Liver: Mildly increased in echogenicity consistent with fatty infiltration. IMPRESSION: Fatty liver.  No acute abnormality noted. Electronically Signed   By: Alcide Clever M.D.   On: 04/16/2016 15:17    Microbiology: No results found for this or any previous visit (from the past 240 hour(s)).   Labs: Basic Metabolic Panel:  Recent Labs Lab 04/17/16 0817 04/18/16 0830 04/19/16 0349 04/20/16 1218 04/21/16 0329  NA 138 128* 132* 133* 129*  K 3.5 4.1 3.6 2.7* 3.4*  CL 102 93* 94* 93* 93*  CO2 24 23 28 30 27   GLUCOSE 254* 397* 323* 254* 334*  BUN 10 16 10 6 10   CREATININE 0.71 0.93 0.78 0.58 0.69  CALCIUM 8.8* 7.7* 8.0* 8.6* 8.2*  MG  --   --   --   --  1.8   Liver Function Tests:  Recent Labs Lab 04/16/16 1354 04/17/16 0817 04/18/16 0830  04/19/16 0349 04/20/16 1218  AST 24 21 42* 42* 29  ALT 20 14 14 14  13*  ALKPHOS 66 58 53 54 59  BILITOT 0.4 0.6 1.5* 1.7* 0.7  PROT 7.9 6.8 6.1* 6.1* 6.2*  ALBUMIN 3.6 3.1* 2.6* 2.3* 2.3*    Recent Labs Lab 04/16/16 1354 04/17/16 0817 04/18/16 0830 04/20/16 1218  LIPASE 1721* 564* 537* 73*   No results for input(s): AMMONIA in the last 168 hours. CBC:  Recent Labs Lab 04/16/16 1354 04/17/16 0333 04/19/16 0349  WBC 9.5 10.8* 18.2*  NEUTROABS  --   --  14.9*  HGB 15.6* 15.9* 11.8*  HCT 43.2 44.5 33.9*  MCV 87.3 88.8 88.3  PLT 343 300 216   Cardiac Enzymes: No results for input(s): CKTOTAL, CKMB, CKMBINDEX, TROPONINI in the last 168 hours. BNP: BNP (last 3 results) No results for input(s): BNP in the last 8760 hours.  ProBNP (last 3 results) No results for input(s): PROBNP in the last 8760 hours.  CBG:  Recent Labs Lab 04/20/16 0737 04/20/16 1146 04/20/16 1752 04/20/16 2144 04/21/16 0836  GLUCAP 267* 245* 321*  227* 329*       Signed:  Rhetta Mura MD   [facility] 04/21/2016, 11:46 AM

## 2016-04-21 NOTE — Progress Notes (Signed)
Pt discharged to home. Discharge instructions completed and understood. Medication regimen reviewed.

## 2016-04-21 NOTE — Progress Notes (Signed)
  RD verbally consulted by MD for nutrition education regarding diabetes. Per MD, pt was consuming sweetened beverages.  Lab Results  Component Value Date   HGBA1C 7.0* 12/16/2015    RD provided "Carbohydrate Counting for People with Diabetes" handout from the Academy of [employer] Dietetics and "My Plate" handout. Discussed different food groups and their effects on blood sugar, emphasizing carbohydrate-containing foods. Provided list of carbohydrates and recommended serving sizes of common foods. Discussed sugar-sweetened beverages and their effects on blood sugar levels. Encouraged water and sugar-free beverage options.  Discussed importance of controlled and consistent carbohydrate intake throughout the day. Provided examples of ways to balance meals/snacks and encouraged intake of high-fiber, whole grain complex carbohydrates. Teach back method used.  Expect fair compliance. Pt reports no previous diet education regarding diabetes. Pt reports not liking her breakfast this morning, only ate her breakfast potatoes, did not like her omelette. Pt was appreciative of education.  There is no weight on file to calculate BMI.   Current diet order is Heart Healthy/ CHO modified, patient is consuming approximately 25% of meals at this time. Labs and medications reviewed. No further nutrition interventions warranted at this time. If additional nutrition issues arise, please re-consult RD.  Tilda Franco, MS, RD, LDN Pager: [phone] After Hours Pager: [phone]

## 2016-04-29 ENCOUNTER — Emergency Department (HOSPITAL_COMMUNITY): Payer: Self-pay

## 2016-04-29 ENCOUNTER — Inpatient Hospital Stay (HOSPITAL_COMMUNITY): Payer: Self-pay

## 2016-04-29 ENCOUNTER — Encounter (HOSPITAL_COMMUNITY): Payer: Self-pay | Admitting: Family Medicine

## 2016-04-29 ENCOUNTER — Inpatient Hospital Stay (HOSPITAL_COMMUNITY)
Admission: EM | Admit: 2016-04-29 | Discharge: 2016-05-01 | DRG: 638 | Disposition: A | Discharge status: Home / Self Care | Payer: Self-pay | Attending: Internal Medicine | Admitting: Internal Medicine

## 2016-04-29 DIAGNOSIS — E1142 Type 2 diabetes mellitus with diabetic polyneuropathy: Secondary | ICD-10-CM | POA: Diagnosis present

## 2016-04-29 DIAGNOSIS — E118 Type 2 diabetes mellitus with unspecified complications: Secondary | ICD-10-CM

## 2016-04-29 DIAGNOSIS — Z91199 Patient's noncompliance with other medical treatment and regimen due to unspecified reason: Secondary | ICD-10-CM | POA: Diagnosis present

## 2016-04-29 DIAGNOSIS — E1165 Type 2 diabetes mellitus with hyperglycemia: Secondary | ICD-10-CM

## 2016-04-29 DIAGNOSIS — I272 Other secondary pulmonary hypertension: Secondary | ICD-10-CM | POA: Diagnosis present

## 2016-04-29 DIAGNOSIS — I5032 Chronic diastolic (congestive) heart failure: Secondary | ICD-10-CM | POA: Diagnosis present

## 2016-04-29 DIAGNOSIS — J45909 Unspecified asthma, uncomplicated: Secondary | ICD-10-CM | POA: Diagnosis present

## 2016-04-29 DIAGNOSIS — Z9119 Patient's noncompliance with other medical treatment and regimen: Secondary | ICD-10-CM

## 2016-04-29 DIAGNOSIS — K860 Alcohol-induced chronic pancreatitis: Secondary | ICD-10-CM | POA: Diagnosis present

## 2016-04-29 DIAGNOSIS — R109 Unspecified abdominal pain: Secondary | ICD-10-CM

## 2016-04-29 DIAGNOSIS — E1110 Type 2 diabetes mellitus with ketoacidosis without coma: Secondary | ICD-10-CM | POA: Diagnosis present

## 2016-04-29 DIAGNOSIS — E11621 Type 2 diabetes mellitus with foot ulcer: Secondary | ICD-10-CM | POA: Diagnosis present

## 2016-04-29 DIAGNOSIS — Z803 Family history of malignant neoplasm of breast: Secondary | ICD-10-CM

## 2016-04-29 DIAGNOSIS — N179 Acute kidney failure, unspecified: Secondary | ICD-10-CM | POA: Diagnosis present

## 2016-04-29 DIAGNOSIS — L97519 Non-pressure chronic ulcer of other part of right foot with unspecified severity: Secondary | ICD-10-CM | POA: Diagnosis present

## 2016-04-29 DIAGNOSIS — I110 Hypertensive heart disease with heart failure: Secondary | ICD-10-CM | POA: Diagnosis present

## 2016-04-29 DIAGNOSIS — E876 Hypokalemia: Secondary | ICD-10-CM | POA: Diagnosis present

## 2016-04-29 DIAGNOSIS — Z8249 Family history of ischemic heart disease and other diseases of the circulatory system: Secondary | ICD-10-CM

## 2016-04-29 DIAGNOSIS — J449 Chronic obstructive pulmonary disease, unspecified: Secondary | ICD-10-CM | POA: Diagnosis present

## 2016-04-29 DIAGNOSIS — F17210 Nicotine dependence, cigarettes, uncomplicated: Secondary | ICD-10-CM | POA: Diagnosis present

## 2016-04-29 DIAGNOSIS — I10 Essential (primary) hypertension: Secondary | ICD-10-CM | POA: Diagnosis present

## 2016-04-29 DIAGNOSIS — R112 Nausea with vomiting, unspecified: Secondary | ICD-10-CM | POA: Diagnosis present

## 2016-04-29 DIAGNOSIS — E1310 Other specified diabetes mellitus with ketoacidosis without coma: Principal | ICD-10-CM | POA: Diagnosis present

## 2016-04-29 DIAGNOSIS — Z6841 Body Mass Index (BMI) 40.0 and over, adult: Secondary | ICD-10-CM

## 2016-04-29 DIAGNOSIS — E785 Hyperlipidemia, unspecified: Secondary | ICD-10-CM | POA: Diagnosis present

## 2016-04-29 DIAGNOSIS — Z794 Long term (current) use of insulin: Secondary | ICD-10-CM | POA: Diagnosis present

## 2016-04-29 DIAGNOSIS — Z7952 Long term (current) use of systemic steroids: Secondary | ICD-10-CM

## 2016-04-29 DIAGNOSIS — Z833 Family history of diabetes mellitus: Secondary | ICD-10-CM

## 2016-04-29 DIAGNOSIS — E8801 Alpha-1-antitrypsin deficiency: Secondary | ICD-10-CM | POA: Diagnosis present

## 2016-04-29 DIAGNOSIS — Z825 Family history of asthma and other chronic lower respiratory diseases: Secondary | ICD-10-CM

## 2016-04-29 DIAGNOSIS — Z9114 Patient's other noncompliance with medication regimen: Secondary | ICD-10-CM

## 2016-04-29 DIAGNOSIS — F1020 Alcohol dependence, uncomplicated: Secondary | ICD-10-CM | POA: Diagnosis present

## 2016-04-29 DIAGNOSIS — Z8049 Family history of malignant neoplasm of other genital organs: Secondary | ICD-10-CM

## 2016-04-29 DIAGNOSIS — IMO0002 Reserved for concepts with insufficient information to code with codable children: Secondary | ICD-10-CM | POA: Diagnosis present

## 2016-04-29 LAB — BASIC METABOLIC PANEL
ANION GAP: 22 — AB (ref 5–15)
ANION GAP: 20 — AB (ref 5–15)
BUN: 10 mg/dL (ref 6–20)
BUN: 9 mg/dL (ref 6–20)
CO2: 17 mmol/L — AB (ref 22–32)
CO2: 17 mmol/L — AB (ref 22–32)
CREATININE: 1.48 mg/dL — AB (ref 0.44–1.00)
Calcium: 9.1 mg/dL (ref 8.9–10.3)
Calcium: 9.1 mg/dL (ref 8.9–10.3)
Chloride: 87 mmol/L — ABNORMAL LOW (ref 101–111)
Chloride: 93 mmol/L — ABNORMAL LOW (ref 101–111)
Creatinine, Ser: 1.32 mg/dL — ABNORMAL HIGH (ref 0.44–1.00)
GFR calc Af Amer: 49 mL/min — ABNORMAL LOW (ref >60)
GFR calc non Af Amer: 42 mL/min — ABNORMAL LOW (ref >60)
GFR calc non Af Amer: 48 mL/min — ABNORMAL LOW (ref >60)
GFR, EST AFRICAN AMERICAN: 56 mL/min — AB (ref >60)
Glucose, Bld: 758 mg/dL (ref 65–99)
Glucose, Bld: 500 mg/dL — ABNORMAL HIGH (ref 65–99)
Potassium: 4.6 mmol/L (ref 3.5–5.1)
Potassium: 4 mmol/L (ref 3.5–5.1)
Sodium: 126 mmol/L — ABNORMAL LOW (ref 135–145)
Sodium: 130 mmol/L — ABNORMAL LOW (ref 135–145)

## 2016-04-29 LAB — CBC
HCT: 37.2 % (ref 36.0–46.0)
HCT: 37.7 % (ref 36.0–46.0)
HEMOGLOBIN: 12.6 g/dL (ref 12.0–15.0)
Hemoglobin: 12.4 g/dL (ref 12.0–15.0)
MCH: 30.5 pg (ref 26.0–34.0)
MCH: 30.4 pg (ref 26.0–34.0)
MCHC: 33.3 g/dL (ref 30.0–36.0)
MCHC: 33.4 g/dL (ref 30.0–36.0)
MCV: 91.6 fL (ref 78.0–100.0)
MCV: 90.8 fL (ref 78.0–100.0)
PLATELETS: 555 10*3/uL — AB (ref 150–400)
Platelets: 515 10*3/uL — ABNORMAL HIGH (ref 150–400)
RBC: 4.06 MIL/uL (ref 3.87–5.11)
RBC: 4.15 MIL/uL (ref 3.87–5.11)
RDW: 13 % (ref 11.5–15.5)
RDW: 12.9 % (ref 11.5–15.5)
WBC: 11.6 10*3/uL — ABNORMAL HIGH (ref 4.0–10.5)
WBC: 11.3 10*3/uL — ABNORMAL HIGH (ref 4.0–10.5)

## 2016-04-29 LAB — COMPREHENSIVE METABOLIC PANEL
ALK PHOS: 73 U/L (ref 38–126)
ALT: 17 U/L (ref 14–54)
AST: 17 U/L (ref 15–41)
Albumin: 2.7 g/dL — ABNORMAL LOW (ref 3.5–5.0)
Anion gap: 20 — ABNORMAL HIGH (ref 5–15)
BUN: 10 mg/dL (ref 6–20)
CALCIUM: 9.3 mg/dL (ref 8.9–10.3)
CHLORIDE: 90 mmol/L — AB (ref 101–111)
CO2: 18 mmol/L — AB (ref 22–32)
CREATININE: 1.39 mg/dL — AB (ref 0.44–1.00)
GFR calc non Af Amer: 45 mL/min — ABNORMAL LOW (ref >60)
GFR, EST AFRICAN AMERICAN: 53 mL/min — AB (ref >60)
GLUCOSE: 673 mg/dL — AB (ref 65–99)
Potassium: 4.8 mmol/L (ref 3.5–5.1)
SODIUM: 128 mmol/L — AB (ref 135–145)
Total Bilirubin: 1.8 mg/dL — ABNORMAL HIGH (ref 0.3–1.2)
Total Protein: 7.7 g/dL (ref 6.5–8.1)

## 2016-04-29 LAB — I-STAT VENOUS BLOOD GAS, ED
ACID-BASE DEFICIT: 5 mmol/L — AB (ref 0.0–2.0)
BICARBONATE: 21 meq/L (ref 20.0–24.0)
O2 Saturation: 47 %
PH VEN: 7.295 (ref 7.250–7.300)
PO2 VEN: 29 mmHg — AB (ref 31.0–45.0)
TCO2: 22 mmol/L (ref 0–100)
pCO2, Ven: 43.2 mmHg — ABNORMAL LOW (ref 45.0–50.0)

## 2016-04-29 LAB — URINE MICROSCOPIC-ADD ON

## 2016-04-29 LAB — URINALYSIS, ROUTINE W REFLEX MICROSCOPIC
BILIRUBIN URINE: NEGATIVE
Nitrite: NEGATIVE
PROTEIN: NEGATIVE mg/dL
Specific Gravity, Urine: 1.028 (ref 1.005–1.030)
pH: 5 (ref 5.0–8.0)

## 2016-04-29 LAB — CBG MONITORING, ED
GLUCOSE-CAPILLARY: 352 mg/dL — AB (ref 65–99)
Glucose-Capillary: 509 mg/dL — ABNORMAL HIGH (ref 65–99)
Glucose-Capillary: 344 mg/dL — ABNORMAL HIGH (ref 65–99)

## 2016-04-29 LAB — BETA-HYDROXYBUTYRIC ACID: BETA-HYDROXYBUTYRIC ACID: 7.12 mmol/L — AB (ref 0.05–0.27)

## 2016-04-29 LAB — LIPASE, BLOOD: LIPASE: 40 U/L (ref 11–51)

## 2016-04-29 LAB — TROPONIN I
Troponin I: <0.03 ng/mL (ref <0.031)
Troponin I: 0.03 ng/mL (ref <0.031)

## 2016-04-29 LAB — POC URINE PREG, ED: Preg Test, Ur: NEGATIVE

## 2016-04-29 MED ORDER — ADULT MULTIVITAMIN W/MINERALS CH
1.0000 | ORAL_TABLET | Freq: Every day | ORAL | Status: DC
  Administered 2016-04-30 – 2016-05-01 (×2): 1 via ORAL
  Filled 2016-04-29 (×3): qty 1

## 2016-04-29 MED ORDER — ALBUTEROL SULFATE HFA 108 (90 BASE) MCG/ACT IN AERS: 4.0000 | INHALATION_SPRAY | Freq: Once | RESPIRATORY_TRACT | Status: DC

## 2016-04-29 MED ORDER — SODIUM CHLORIDE 0.9 % IV SOLN
INTRAVENOUS | Status: DC
  Administered 2016-04-29: 5.4 [IU]/h via INTRAVENOUS
  Filled 2016-04-29: qty 2.5

## 2016-04-29 MED ORDER — CLONIDINE HCL 0.1 MG PO TABS
0.1000 mg | ORAL_TABLET | Freq: Two times a day (BID) | ORAL | Status: DC
  Administered 2016-04-29 – 2016-04-30 (×2): 0.1 mg via ORAL
  Filled 2016-04-29 (×2): qty 1

## 2016-04-29 MED ORDER — ENOXAPARIN SODIUM 40 MG/0.4ML ~~LOC~~ SOLN
40.0000 mg | SUBCUTANEOUS | Status: DC
  Administered 2016-04-30 – 2016-05-01 (×2): 40 mg via SUBCUTANEOUS
  Filled 2016-04-29 (×3): qty 0.4

## 2016-04-29 MED ORDER — SODIUM CHLORIDE 0.9 % IV BOLUS (SEPSIS)
1000.0000 mL | Freq: Once | INTRAVENOUS | Status: AC
  Administered 2016-04-29: 1000 mL via INTRAVENOUS

## 2016-04-29 MED ORDER — FENTANYL CITRATE (PF) 100 MCG/2ML IJ SOLN
50.0000 ug | INTRAMUSCULAR | Status: DC | PRN
  Administered 2016-04-29: 50 ug via INTRAVENOUS
  Filled 2016-04-29: qty 2

## 2016-04-29 MED ORDER — MORPHINE SULFATE (PF) 2 MG/ML IV SOLN
1.0000 mg | INTRAVENOUS | Status: DC | PRN
  Administered 2016-04-29 – 2016-05-01 (×7): 1 mg via INTRAVENOUS
  Filled 2016-04-29 (×7): qty 1

## 2016-04-29 MED ORDER — SODIUM CHLORIDE 0.9 % IV SOLN
INTRAVENOUS | Status: DC
  Administered 2016-04-29: 4.5 [IU]/h via INTRAVENOUS
  Filled 2016-04-29: qty 2.5

## 2016-04-29 MED ORDER — PANTOPRAZOLE SODIUM 40 MG IV SOLR
40.0000 mg | Freq: Two times a day (BID) | INTRAVENOUS | Status: DC
  Administered 2016-04-29 – 2016-05-01 (×4): 40 mg via INTRAVENOUS
  Filled 2016-04-29 (×4): qty 40

## 2016-04-29 MED ORDER — FOLIC ACID 1 MG PO TABS
1.0000 mg | ORAL_TABLET | Freq: Every day | ORAL | Status: DC
  Administered 2016-04-30: 1 mg via ORAL
  Filled 2016-04-29: qty 1

## 2016-04-29 MED ORDER — MOMETASONE FURO-FORMOTEROL FUM 100-5 MCG/ACT IN AERO
2.0000 | INHALATION_SPRAY | Freq: Two times a day (BID) | RESPIRATORY_TRACT | Status: DC
  Administered 2016-04-30 – 2016-05-01 (×3): 2 via RESPIRATORY_TRACT
  Filled 2016-04-29: qty 8.8

## 2016-04-29 MED ORDER — GEMFIBROZIL 600 MG PO TABS
600.0000 mg | ORAL_TABLET | Freq: Two times a day (BID) | ORAL | Status: DC
  Administered 2016-04-30 (×2): 600 mg via ORAL
  Filled 2016-04-29 (×2): qty 1

## 2016-04-29 MED ORDER — POTASSIUM CHLORIDE 10 MEQ/100ML IV SOLN
10.0000 meq | INTRAVENOUS | Status: AC
  Administered 2016-04-30 (×4): 10 meq via INTRAVENOUS
  Filled 2016-04-29 (×4): qty 100

## 2016-04-29 MED ORDER — DEXTROSE-NACL 5-0.45 % IV SOLN: INTRAVENOUS | Status: DC

## 2016-04-29 MED ORDER — DEXTROSE-NACL 5-0.45 % IV SOLN
INTRAVENOUS | Status: DC
  Administered 2016-04-30: 01:00:00 via INTRAVENOUS

## 2016-04-29 MED ORDER — VITAMIN B-1 100 MG PO TABS
100.0000 mg | ORAL_TABLET | Freq: Every day | ORAL | Status: DC
  Administered 2016-04-30: 100 mg via ORAL
  Filled 2016-04-29: qty 1

## 2016-04-29 MED ORDER — ATORVASTATIN CALCIUM 20 MG PO TABS
20.0000 mg | ORAL_TABLET | Freq: Every day | ORAL | Status: DC
  Filled 2016-04-29: qty 1

## 2016-04-29 MED ORDER — LORAZEPAM 2 MG/ML IJ SOLN: 1.0000 mg | Freq: Four times a day (QID) | INTRAMUSCULAR | Status: DC | PRN

## 2016-04-29 MED ORDER — HYDROCORTISONE NA SUCCINATE PF 100 MG IJ SOLR
50.0000 mg | Freq: Two times a day (BID) | INTRAMUSCULAR | Status: DC
  Administered 2016-04-29 – 2016-04-30 (×2): 50 mg via INTRAVENOUS
  Filled 2016-04-29 (×2): qty 2

## 2016-04-29 MED ORDER — SODIUM CHLORIDE 0.9 % IV SOLN
INTRAVENOUS | Status: AC
  Administered 2016-04-29: 150 mL/h via INTRAVENOUS
  Administered 2016-04-30: 01:00:00 via INTRAVENOUS

## 2016-04-29 MED ORDER — SODIUM CHLORIDE 0.9 % IV SOLN: INTRAVENOUS | Status: DC

## 2016-04-29 MED ORDER — GABAPENTIN 300 MG PO CAPS
300.0000 mg | ORAL_CAPSULE | Freq: Two times a day (BID) | ORAL | Status: DC
  Administered 2016-04-29 – 2016-05-01 (×4): 300 mg via ORAL
  Filled 2016-04-29 (×4): qty 1

## 2016-04-29 MED ORDER — THIAMINE HCL 100 MG/ML IJ SOLN
100.0000 mg | Freq: Every day | INTRAMUSCULAR | Status: DC
  Administered 2016-04-29: 100 mg via INTRAVENOUS
  Filled 2016-04-29: qty 2

## 2016-04-29 MED ORDER — SODIUM CHLORIDE 0.9 % IV BOLUS (SEPSIS)
1000.0000 mL | Freq: Once | INTRAVENOUS | Status: DC
  Administered 2016-04-29: 1000 mL via INTRAVENOUS

## 2016-04-29 MED ORDER — LORAZEPAM 1 MG PO TABS: 1.0000 mg | ORAL_TABLET | Freq: Four times a day (QID) | ORAL | Status: DC | PRN

## 2016-04-29 MED ORDER — ALBUTEROL SULFATE (2.5 MG/3ML) 0.083% IN NEBU: 2.5000 mg | INHALATION_SOLUTION | RESPIRATORY_TRACT | Status: DC | PRN

## 2016-04-29 MED ORDER — ONDANSETRON HCL 4 MG/2ML IJ SOLN
4.0000 mg | Freq: Once | INTRAMUSCULAR | Status: AC
  Administered 2016-04-29: 4 mg via INTRAVENOUS
  Filled 2016-04-29: qty 2

## 2016-04-29 NOTE — ED Notes (Signed)
Attempted IV, unsuccessful, unable to advance catheter. Catheter removed, dressing applied, no bleeding or swelling noted

## 2016-04-29 NOTE — ED Notes (Signed)
Pt. Not in the room, she went to x-ray and then [place].

## 2016-04-29 NOTE — H&P (Signed)
History and Physical    CAROLL WEINHEIMER XBJ:[id number] DOB: [birth year] DOA: 04/29/2016  PCP: Lora Paula, MD  Patient coming from: Home.  Chief Complaint: Abdominal pain with nausea vomiting.  HPI: Carla Hicks is a 45 y.o. female with medical history significant of diabetes mellitus type 2, hypertension, alcoholism, hyperlipidemia and COPD/Alpha I antitrypsin deficiency on chronic steroids who was recently admitted for acute pancreatitis and left AMA presents to the ER because of persistent abdominal pain. Patient abdominal pain in the right flank and right upper quadrant with epigastric pain and on exam patient has epigastric tenderness. Patient states over the last couple of days patient has been having multiple episodes of vomiting denies any blood in the vomitus or any diarrhea. Patient during last admission was found to have markedly elevated blood sugar and was discharged on Levemir which patient has not been taking since patient does not have a glucometer patient was scared that she may become hypoglycemic. In the ER patient's blood sugar was found to be in the 700s with elevated anion gap and positive beta hydroxybutyrate. Urine ketones are positive. Chest x-ray and UA does not show any signs of infection. Patient had a nail injury to her right foot one month ago and states she did have a tetanus toxoid dose last year. X-rays don't reveal any infection and on exam there is no pus or drainage from the right foot. Patient otherwise denies any chest pain or shortness of breath. Patient will be admitted for diabetic ketoacidosis and abdominal pain with nausea vomiting. Recent CT scan done last 2 weeks ago was showing features consistent with acute pancreatitis probably from alcoholism since gallbladder did not show any stones.   ED Course: Patient was started on IV fluid boluses 3 L and started on IV insulin infusion continuous IV normal saline infusion. Sonogram of the abdomen is  pending.  Review of Systems: As per HPI otherwise 10 point review of systems negative.    Past Medical History  Diagnosis Date  . Diabetes mellitus without complication (HCC)   . Asthma     Past Surgical History  Procedure Laterality Date  . Cesarean section       reports that she quit smoking about 4 years ago. Her smoking use included Cigarettes. She has a 37.5 pack-year smoking history. She has never used smokeless tobacco. She reports that she drinks alcohol. She reports that she does not use illicit drugs.  Allergies  Allergen Reactions  . Penicillins Hives    Has patient had a PCN reaction causing immediate rash, facial/tongue/throat swelling, SOB or lightheadedness with hypotension: yes Has patient had a PCN reaction causing severe rash involving mucus membranes or skin necrosis: no Has patient had a PCN reaction that required hospitalization no Has patient had a PCN reaction occurring within the last 10 years: no If all of the above answers are "NO", then may proceed with Cephalosporin use.   . Robitussin Liquid Center [Menthol] Hives    Family History  Problem Relation Age of Onset  . Emphysema Mother     smoked  . Allergies Mother   . Asthma Mother   . Breast cancer Mother   . Uterine cancer Mother   . Heart disease Maternal Grandmother   . Diabetes Father     Prior to Admission medications   Medication Sig Start Date End Date Taking? Authorizing Provider  albuterol (PROVENTIL) (5 MG/ML) 0.5% nebulizer solution Take 0.5 mLs (2.5 mg total) by nebulization every 4 (  four) hours as needed for wheezing or shortness of breath. Sob 04/24/15  Yes Henrietta Hoover, NP  atorvastatin (LIPITOR) 20 MG tablet Take 1 tablet (20 mg total) by mouth daily at 6 PM. 04/21/16  Yes Rhetta Mura, MD  budesonide-formoterol (SYMBICORT) 80-4.5 MCG/ACT inhaler Take 2 puffs first thing in am and then another 2 puffs about 12 hours later. 04/21/16  Yes Rhetta Mura, MD  cloNIDine  (CATAPRES) 0.1 MG tablet Take 1 tablet (0.1 mg total) by mouth 2 (two) times daily. 04/21/16  Yes Rhetta Mura, MD  diclofenac sodium (VOLTAREN) 1 % GEL Apply 1 application topically 4 (four) times daily. Patient taking differently: Apply 1 application topically 4 (four) times daily as needed (pain.).  05/30/15  Yes Hayden Rasmussen, NP  famotidine (PEPCID) 20 MG tablet One at bedtime Patient taking differently: Take 20 mg by mouth at bedtime. One at bedtime 12/27/15  Yes Nyoka Cowden, MD  gabapentin (NEURONTIN) 300 MG capsule Take 1 capsule (300 mg total) by mouth 2 (two) times daily. 09/18/15  Yes Tiffany Netta Cedars, PA-C  gemfibrozil (LOPID) 600 MG tablet Take 1 tablet (600 mg total) by mouth 2 (two) times daily before a meal. 04/21/16  Yes Rhetta Mura, MD  glipiZIDE (GLUCOTROL) 5 MG tablet Take 1 tablet (5 mg total) by mouth 2 (two) times daily before a meal. 04/21/16  Yes Rhetta Mura, MD  insulin detemir (LEVEMIR) 100 UNIT/ML injection Inject 0.18 mLs (18 Units total) into the skin 2 (two) times daily. 04/21/16  Yes Rhetta Mura, MD  metFORMIN (GLUCOPHAGE) 500 MG tablet Take 1 tablet (500 mg total) by mouth 2 (two) times daily with a meal. Needs office visit for more refills 04/21/16  Yes Rhetta Mura, MD  Multiple Vitamin (MULTIVITAMIN WITH MINERALS) TABS tablet Take 1 tablet by mouth daily.   Yes Historical Provider, MD  pantoprazole (PROTONIX) 40 MG tablet Take 1 tablet (40 mg total) by mouth daily. Take 30-60 min before first meal of the day 09/18/15  Yes Tiffany S Noel, PA-C  potassium chloride SA (K-DUR,KLOR-CON) 20 MEQ tablet Take 1 tablet (20 mEq total) by mouth daily. 05/09/15  Yes Henrietta Hoover, NP  predniSONE (DELTASONE) 20 MG tablet Take 1 tablet (20 mg total) by mouth daily with breakfast. 04/21/16  Yes Rhetta Mura, MD  acetaminophen-codeine (TYLENOL #3) 300-30 MG tablet Take 1 tablet by mouth every 8 (eight) hours as needed for moderate pain. 12/27/15   Nyoka Cowden, MD  Insulin Pen Needle 29G X MISC 1 Device by Does not apply route 4 (four) times daily - after meals and at bedtime. 04/21/16   Rhetta Mura, MD  INSULIN SYRINGE 1CC/29G 29G X 1/2" 1 ML MISC 1 Syringe by Does not apply route 4 (four) times daily - after meals and at bedtime. 04/21/16   Rhetta Mura, MD  oxyCODONE-acetaminophen (PERCOCET) 7.5-325 MG tablet Take 1 tablet by mouth every 4 (four) hours as needed for moderate pain. 04/21/16   Rhetta Mura, MD  Respiratory Therapy Supplies (FLUTTER) DEVI Use as directed 12/27/15   Nyoka Cowden, MD    Physical Exam: Filed Vitals:   04/29/16 1427 04/29/16 1915 04/29/16 1945 04/29/16 2000  BP: 124/65 113/75 124/79 127/93  Pulse: 101 95 99 97  Temp: 99 F (37.2 C)     Resp:  SpO2: 98% 100% 99% 97%      Constitutional: Not in distress. Filed Vitals:   04/29/16 1427 04/29/16 1915 04/29/16 1945  04/29/16 2000  BP: 124/65 113/75 124/79 127/93  Pulse: 101 95 99 97  Temp: 99 F (37.2 C)     Resp:  22 19 21   SpO2: 98% 100% 99% 97%   Eyes: Anicteric no pallor. ENMT: No discharge from the ears eyes nose or mouth. Neck: No mass felt. No JVD appreciated. Respiratory: No rhonchi or crepitations. Cardiovascular:  S1 and S2 heard. Abdomen: Epigastric tenderness no guarding or rigidity. Musculoskeletal: Right foot has an ulceration on the first metatarsal head. No active discharge. Skin: Ulceration on the right foot. Neurologic: Alert awake oriented to time place and person. Moves all extremities. Psychiatric: Appears normal.   Labs on Admission: I have personally reviewed following labs and imaging studies  CBC:  Recent Labs Lab 04/29/16 1335  WBC 11.6*  HGB 12.4  HCT 37.2  MCV 91.6  PLT 555*   Basic Metabolic Panel:  Recent Labs Lab 04/29/16 1335 04/29/16 1740  NA 128* 126*  K 4.8 4.6  CL 90* 87*  CO2 18* 17*  GLUCOSE 673* 758*  BUN 10 10  CREATININE 1.39* 1.48*  CALCIUM 9.3 9.1    GFR: CrCl cannot be calculated (Unknown ideal weight.). Liver Function Tests:  Recent Labs Lab 04/29/16 1335  AST 17  ALT 17  ALKPHOS 73  BILITOT 1.8*  PROT 7.7  ALBUMIN 2.7*    Recent Labs Lab 04/29/16 1335  LIPASE 40   No results for input(s): AMMONIA in the last 168 hours. Coagulation Profile: No results for input(s): INR, PROTIME in the last 168 hours. Cardiac Enzymes:  Recent Labs Lab 04/29/16 1740  TROPONINI <0.03   BNP (last 3 results) No results for input(s): PROBNP in the last 8760 hours. HbA1C: No results for input(s): HGBA1C in the last 72 hours. CBG: No results for input(s): GLUCAP in the last 168 hours. Lipid Profile: No results for input(s): CHOL, HDL, LDLCALC, TRIG, CHOLHDL, LDLDIRECT in the last 72 hours. Thyroid Function Tests: No results for input(s): TSH, T4TOTAL, FREET4, T3FREE, THYROIDAB in the last 72 hours. Anemia Panel: No results for input(s): VITAMINB12, FOLATE, FERRITIN, TIBC, IRON, RETICCTPCT in the last 72 hours. Urine analysis:    Component Value Date/Time   COLORURINE YELLOW 04/29/2016 1750   APPEARANCEUR HAZY* 04/29/2016 1750   LABSPEC 1.028 04/29/2016 1750   PHURINE 5.0 04/29/2016 1750   GLUCOSEU >1000* 04/29/2016 1750   HGBUR SMALL* 04/29/2016 1750   BILIRUBINUR NEGATIVE 04/29/2016 1750   BILIRUBINUR neg 09/18/2015 1131   KETONESUR >80* 04/29/2016 1750   PROTEINUR NEGATIVE 04/29/2016 1750   PROTEINUR 30 09/18/2015 1131   UROBILINOGEN 0.2 09/18/2015 1131   UROBILINOGEN 0.2 12/11/2014 1522   NITRITE NEGATIVE 04/29/2016 1750   NITRITE neg 09/18/2015 1131   LEUKOCYTESUR SMALL* 04/29/2016 1750   Sepsis Labs: @LABRCNTIP (procalcitonin:4,lacticidven:4) )No results found for this or any previous visit (from the past 240 hour(s)).   Radiological Exams on Admission: Dg Chest 2 View  04/29/2016  CLINICAL DATA:  Epigastric pain, shortness of breath, cough for 2 days, diabetes mellitus, asthma, former smoker EXAM: CHEST  2  VIEW COMPARISON:  12/27/2015 FINDINGS: Upper normal heart size. Mediastinal contours and pulmonary vascularity normal. Minimal RIGHT basilar atelectasis. Lungs otherwise clear. No pleural effusion or pneumothorax. Bones unremarkable. IMPRESSION: Minimal RIGHT basilar atelectasis. Electronically Signed   By: [name] M.D.   On: 04/29/2016 17:26   Dg Foot Complete Right  04/29/2016  CLINICAL DATA:  Stepped on nail with RIGHT foot 3 weeks ago, RIGHT foot pain, diabetes mellitus, initial  encounter EXAM: RIGHT FOOT COMPLETE - 3+ VIEW COMPARISON:  None FINDINGS: Osseous mineralization normal. Joint spaces preserved. Soft tissue swelling at medial RIGHT foot. Tiny plantar calcaneal spur. No acute fracture, dislocation or bone destruction. No definite soft tissue gas. Scattered small vessel vascular calcifications. IMPRESSION: No definite acute bony abnormalities. Electronically Signed   By: [name] M.D.   On: 04/29/2016 17:27    EKG: Independently reviewed. Normal sinus rhythm with RBBB and LVH.  Assessment/Plan Principal Problem:   DKA, type 2 (HCC) Active Problems:   Essential hypertension, benign   Acute renal failure (ARF) (HCC)   Dyslipidemia associated with type 2 diabetes mellitus (HCC)   Severe obesity (BMI >= 40) (HCC)   Alcohol abuse   Nausea & vomiting    #1. Diabetic ketoacidosis in a type 2 diabetes - patient was instructed and advised to take Levemir during last admission 2 weeks ago. Patient did not take Levemir. Patient displays in diabetic ketoacidosis secondary to noncompliance. Check hemoglobin A1c last hemoglobin A1c was in December 2016 was 7. Patient was started on IV fluid infusion after boluses and IV insulin infusion. Closely follow metabolic panel and once anion gap gets corrected start long-acting insulin. #2. Abdominal pain with nausea and vomiting - patient states she has not had any alcohol for last 2 weeks. During last a CT scan showed features consistent with  acute pancreatitis. Patient still has abdominal tenderness. Check sonogram of the abdomen and for now patient is nothing by mouth except medications. Recheck LFTs and lipase. Patient could be having abdominal pain from either pancreatitis or alcoholic gastritis. Patient on Protonix IV. Check cardiac markers. #3. Acute renal failure probably from dehydration - aggressively hydrated and closely follow metabolic panel. #4. COPD and alpha-1 antitrypsin deficiency - patient is on chronic steroids which she has not taken last 2 days. For now patient has been placed on stress dose steroids until patient can relate to take orally. Continue inhalers. #5. Right foot diabetic ulcer - no active discharge and will need close follow-up as outpatient. Patient states she has had tetanus toxoid last December. #6. Hypertension - continue home medications. #7. Hyperlipidemia - continue statins and Lopid. #8. Tobacco and alcohol abuse - patient is on CIWA protocol. Patient states she has not had any alcohol last few days. Advised to quit smoking.   DVT prophylaxis: Lovenox. Code Status: Full code.  Family Communication: Dr. at the bedside.  Disposition Plan: Home.  Consults called: None.  Admission status: Inpatient. Stepdown. Likely stay 2 days.    Eduard Clos MD [facility] Pager [phone].  If 7PM-7AM, please contact night-coverage [email or web address] Password [email or web address]  04/29/2016, 8:22 PM

## 2016-04-29 NOTE — ED Provider Notes (Signed)
CSN: [id number]     Arrival date & time 04/29/16  1320 History   First MD Initiated Contact with Patient 04/29/16 1611     Chief Complaint  Patient presents with  . Abdominal Pain     (Consider location/radiation/quality/duration/timing/severity/associated sxs/prior Treatment) Patient is a 45 y.o. female presenting with general illness. The history is provided by the patient and medical records.  Illness Location:  N/v Severity:  Moderate Onset quality:  Gradual Duration:  2 days Timing:  Constant Progression:  Unchanged Chronicity:  Recurrent Context:  History EtOH abuse, recurrent pancreatitis, diabetes, asthma, medication nonadherence. Recent admission for pancreatitis. Discharged 1 week ago. Onset of right upper quadrant and epigastric pain 2 days ago with nausea and vomiting. Has not used any insulin in 2 days. Endorses cough. Endorses right foot pain with wound for 1 month. Associated symptoms: abdominal pain, cough, fatigue, fever, nausea, rash, shortness of breath, vomiting and wheezing   Associated symptoms: no chest pain, no diarrhea and no headaches     Past Medical History  Diagnosis Date  . Diabetes mellitus without complication (HCC)   . Asthma    Past Surgical History  Procedure Laterality Date  . Cesarean section     Family History  Problem Relation Age of Onset  . Emphysema Mother     smoked  . Allergies Mother   . Asthma Mother   . Breast cancer Mother   . Uterine cancer Mother   . Heart disease Maternal Grandmother   . Diabetes Father    Social History  Substance Use Topics  . Smoking status: Former Smoker -- 1.50 packs/day for 25 years    Types: Cigarettes    Quit date: 12/22/2011  . Smokeless tobacco: Never Used  . Alcohol Use: No   OB History    Gravida Para Term Preterm AB TAB SAB Ectopic Multiple Living   5 4 4       4      Review of Systems  Constitutional: Positive for fever, chills, activity change, appetite change and fatigue.    Respiratory: Positive for cough, shortness of breath and wheezing.   Cardiovascular: Negative for chest pain and leg swelling.  Gastrointestinal: Positive for nausea, vomiting and abdominal pain. Negative for diarrhea.  Genitourinary: Negative for dysuria, vaginal bleeding and vaginal discharge.  Musculoskeletal: Negative for back pain.  Skin: Positive for rash.  Neurological: Positive for light-headedness. Negative for headaches.  All other systems reviewed and are negative.     Allergies  Robitussin liquid center and Penicillins  Home Medications   Prior to Admission medications   Medication Sig Start Date End Date Taking? Authorizing Provider  acetaminophen-codeine (TYLENOL #3) 300-30 MG tablet Take 1 tablet by mouth every 8 (eight) hours as needed for moderate pain. 12/27/15   Nyoka Cowden, MD  albuterol (PROVENTIL) (5 MG/ML) 0.5% nebulizer solution Take 0.5 mLs (2.5 mg total) by nebulization every 4 (four) hours as needed for wheezing or shortness of breath. Sob 04/24/15   Henrietta Hoover, NP  atorvastatin (LIPITOR) 20 MG tablet Take 1 tablet (20 mg total) by mouth daily at 6 PM. 04/21/16   Rhetta Mura, MD  budesonide-formoterol (SYMBICORT) 80-4.5 MCG/ACT inhaler Take 2 puffs first thing in am and then another 2 puffs about 12 hours later. 04/21/16   Rhetta Mura, MD  cloNIDine (CATAPRES) 0.1 MG tablet Take 1 tablet (0.1 mg total) by mouth 2 (two) times daily. 04/21/16   Rhetta Mura, MD  diclofenac sodium (VOLTAREN) 1 % GEL  Apply 1 application topically 4 (four) times daily. Patient taking differently: Apply 1 application topically 4 (four) times daily as needed (pain.).  05/30/15   Hayden Rasmussen, NP  famotidine (PEPCID) 20 MG tablet One at bedtime Patient taking differently: Take 20 mg by mouth at bedtime. One at bedtime 12/27/15   Nyoka Cowden, MD  gabapentin (NEURONTIN) 300 MG capsule Take 1 capsule (300 mg total) by mouth 2 (two) times daily. 09/18/15   Tiffany Netta Cedars, PA-C  gemfibrozil (LOPID) 600 MG tablet Take 1 tablet (600 mg total) by mouth 2 (two) times daily before a meal. 04/21/16   Rhetta Mura, MD  glipiZIDE (GLUCOTROL) 5 MG tablet Take 1 tablet (5 mg total) by mouth 2 (two) times daily before a meal. 04/21/16   Rhetta Mura, MD  insulin detemir (LEVEMIR) 100 UNIT/ML injection Inject 0.18 mLs (18 Units total) into the skin 2 (two) times daily. 04/21/16   Rhetta Mura, MD  Insulin Pen Needle 29G X MISC 1 Device by Does not apply route 4 (four) times daily - after meals and at bedtime. 04/21/16   Rhetta Mura, MD  INSULIN SYRINGE 1CC/29G 29G X 1/2" 1 ML MISC 1 Syringe by Does not apply route 4 (four) times daily - after meals and at bedtime. 04/21/16   Rhetta Mura, MD  metFORMIN (GLUCOPHAGE) 500 MG tablet Take 1 tablet (500 mg total) by mouth 2 (two) times daily with a meal. Needs office visit for more refills 04/21/16   Rhetta Mura, MD  Multiple Vitamin (MULTIVITAMIN WITH MINERALS) TABS tablet Take 1 tablet by mouth daily.    Historical Provider, MD  oxyCODONE-acetaminophen (PERCOCET) 7.5-325 MG tablet Take 1 tablet by mouth every 4 (four) hours as needed for moderate pain. 04/21/16   Rhetta Mura, MD  pantoprazole (PROTONIX) 40 MG tablet Take 1 tablet (40 mg total) by mouth daily. Take 30-60 min before first meal of the day 09/18/15   Vivianne Master, PA-C  potassium chloride SA (K-DUR,KLOR-CON) 20 MEQ tablet Take 1 tablet (20 mEq total) by mouth daily. 05/09/15   Henrietta Hoover, NP  predniSONE (DELTASONE) 20 MG tablet Take 1 tablet (20 mg total) by mouth daily with breakfast. 04/21/16   Rhetta Mura, MD  Respiratory Therapy Supplies (FLUTTER) DEVI Use as directed 12/27/15   Nyoka Cowden, MD   BP 124/65 mmHg  Pulse 101  Temp(Src) 99 F (37.2 C)  SpO2 98%  LMP 03/21/2016 Physical Exam  Constitutional: She is oriented to person, place, and time. She appears ill. No distress.  HENT:  Head:  Normocephalic and atraumatic.  Mouth/Throat: Mucous membranes are dry.  Eyes: EOM are normal. Pupils are equal, round, and reactive to light.  Neck: Normal range of motion.  Cardiovascular: Regular rhythm.  Tachycardia present.   Pulmonary/Chest: Tachypnea noted. No respiratory distress. She has wheezes. She has rhonchi.  Abdominal: Soft. Normal appearance. There is tenderness in the right upper quadrant and epigastric area. There is guarding and positive Murphy's sign. There is no rebound, no CVA tenderness and no tenderness at McBurney's point.  Musculoskeletal:       Feet:  Neurological: She is alert and oriented to person, place, and time. GCS eye subscore is 4. GCS verbal subscore is 5. GCS motor subscore is 6.  Skin: Skin is warm and dry.    ED Course  Procedures (including critical care time) Labs Review Labs Reviewed  COMPREHENSIVE METABOLIC PANEL - Abnormal; Notable for the following:    Sodium  128 (*)    Chloride 90 (*)    CO2 18 (*)    Glucose, Bld 673 (*)    Creatinine, Ser 1.39 (*)    Albumin 2.7 (*)    Total Bilirubin 1.8 (*)    GFR calc non Af Amer 45 (*)    GFR calc Af Amer 53 (*)    Anion gap 20 (*)    All other components within normal limits  CBC - Abnormal; Notable for the following:    WBC 11.6 (*)    Platelets 555 (*)    All other components within normal limits  URINALYSIS, ROUTINE W REFLEX MICROSCOPIC (NOT AT [facility]) - Abnormal; Notable for the following:    APPearance HAZY (*)    Glucose, UA >1000 (*)    Hgb urine dipstick SMALL (*)    Ketones, ur >80 (*)    Leukocytes, UA SMALL (*)    All other components within normal limits  BETA-HYDROXYBUTYRIC ACID - Abnormal; Notable for the following:    Beta-Hydroxybutyric Acid 7.12 (*)    All other components within normal limits  BASIC METABOLIC PANEL - Abnormal; Notable for the following:    Sodium 126 (*)    Chloride 87 (*)    CO2 17 (*)    Glucose, Bld 758 (*)    Creatinine, Ser 1.48 (*)    GFR calc  non Af Amer 42 (*)    GFR calc Af Amer 49 (*)    Anion gap 22 (*)    All other components within normal limits  URINE MICROSCOPIC-ADD ON - Abnormal; Notable for the following:    Squamous Epithelial / LPF 0-5 (*)    Bacteria, UA FEW (*)    All other components within normal limits  I-STAT VENOUS BLOOD GAS, ED - Abnormal; Notable for the following:    pCO2, Ven 43.2 (*)    pO2, Ven 29.0 (*)    Acid-base deficit 5.0 (*)    All other components within normal limits  LIPASE, BLOOD  TROPONIN I  BLOOD GAS, VENOUS  BASIC METABOLIC PANEL  BASIC METABOLIC PANEL  BASIC METABOLIC PANEL  POC URINE PREG, ED    Imaging Review Dg Chest 2 View  04/29/2016  CLINICAL DATA:  Epigastric pain, shortness of breath, cough for 2 days, diabetes mellitus, asthma, former smoker EXAM: CHEST  2 VIEW COMPARISON:  12/27/2015 FINDINGS: Upper normal heart size. Mediastinal contours and pulmonary vascularity normal. Minimal RIGHT basilar atelectasis. Lungs otherwise clear. No pleural effusion or pneumothorax. Bones unremarkable. IMPRESSION: Minimal RIGHT basilar atelectasis. Electronically Signed   By: [name] M.D.   On: 04/29/2016 17:26   Dg Foot Complete Right  04/29/2016  CLINICAL DATA:  Stepped on nail with RIGHT foot 3 weeks ago, RIGHT foot pain, diabetes mellitus, initial encounter EXAM: RIGHT FOOT COMPLETE - 3+ VIEW COMPARISON:  None FINDINGS: Osseous mineralization normal. Joint spaces preserved. Soft tissue swelling at medial RIGHT foot. Tiny plantar calcaneal spur. No acute fracture, dislocation or bone destruction. No definite soft tissue gas. Scattered small vessel vascular calcifications. IMPRESSION: No definite acute bony abnormalities. Electronically Signed   By: [name] M.D.   On: 04/29/2016 17:27   I have personally reviewed and evaluated these images and lab results as part of my medical decision-making.   EKG Interpretation   Date/Time:  Wednesday Apr 29 2016 18:03:49 EDT Ventricular  Rate:  96 PR Interval:  134 QRS Duration: 132 QT Interval:  393 QTC Calculation: 497 R Axis:   -  53 Text Interpretation:  Sinus rhythm RBBB and LAFB Left ventricular  hypertrophy When compared with ECG of 12/16/2015, No significant change  was found Confirmed by [facility]  MD, DAVID ([id number]) on 04/29/2016 6:25:52 PM      MDM   Final diagnoses:  Diabetic ketoacidosis without coma associated with type 2 diabetes mellitus (HCC)    Patient with history of diabetes, medication nonadherence, presenting in DKA.  Endorses nausea vomiting and epigastric pain. Patient had a right upper quadrant ultrasound as well as CAT scan of her abdomen within the last 1-2 weeks. Doubt acute abdomen at this time. She has no peritoneal signs on exam. We'll defer further imaging modalities, including HIDA scan, to the inpatient team. She has dry mucous membranes on exam. Mildly tachycardic. Normotensive. Diffuse rhonchi and wheezing appreciated on exam. She does endorse some recent cough. Getting a chest x-ray. Giving her albuterol. Also has a wound on her right foot. Getting an x-ray of that.  Labs were done while the patient was in the waiting room. Hyperglycemia with anion gap of 20. Mild leukocytosis. AKI. Sending off more labs now. Starting the patient on IV fluids for concern for DKA. Starting an insulin drip. We'll start her on D5 half-normal saline infusion once her blood sugar reaches 250. We'll get q2 hour glucose checks and BMPs.  Chest x-ray without evidence of pneumonia. Foot x-ray shows no evidence of osteomyelitis. Needs admission for DKA.  7:20 PM Spoke with hospitalist. We'll admit the patient to the stepdown unit. We'll continue to give IV fluids and IV insulin until time of admission. Had discussion at patient's bedside with family members regarding her condition. They were agreeable to admission at this time.  Patient admitted in stable condition.  Lindalou Hose, MD 04/30/16 6045  Dione Booze,  MD 04/30/16 2245

## 2016-04-29 NOTE — ED Notes (Signed)
Admitting MD at the bedside.  

## 2016-04-29 NOTE — ED Notes (Signed)
Pt here for upper abd pain and increased CBG 554. Pt also N,V.

## 2016-04-30 ENCOUNTER — Inpatient Hospital Stay (HOSPITAL_COMMUNITY): Payer: MEDICAID

## 2016-04-30 DIAGNOSIS — E876 Hypokalemia: Secondary | ICD-10-CM

## 2016-04-30 DIAGNOSIS — E785 Hyperlipidemia, unspecified: Secondary | ICD-10-CM

## 2016-04-30 DIAGNOSIS — K860 Alcohol-induced chronic pancreatitis: Secondary | ICD-10-CM | POA: Diagnosis present

## 2016-04-30 DIAGNOSIS — E118 Type 2 diabetes mellitus with unspecified complications: Secondary | ICD-10-CM

## 2016-04-30 DIAGNOSIS — I272 Other secondary pulmonary hypertension: Secondary | ICD-10-CM

## 2016-04-30 DIAGNOSIS — I5032 Chronic diastolic (congestive) heart failure: Secondary | ICD-10-CM | POA: Diagnosis present

## 2016-04-30 DIAGNOSIS — F1010 Alcohol abuse, uncomplicated: Secondary | ICD-10-CM

## 2016-04-30 DIAGNOSIS — Z91199 Patient's noncompliance with other medical treatment and regimen due to unspecified reason: Secondary | ICD-10-CM | POA: Diagnosis present

## 2016-04-30 DIAGNOSIS — IMO0002 Reserved for concepts with insufficient information to code with codable children: Secondary | ICD-10-CM | POA: Diagnosis present

## 2016-04-30 DIAGNOSIS — E1165 Type 2 diabetes mellitus with hyperglycemia: Secondary | ICD-10-CM

## 2016-04-30 DIAGNOSIS — Z9119 Patient's noncompliance with other medical treatment and regimen: Secondary | ICD-10-CM | POA: Diagnosis present

## 2016-04-30 LAB — GLUCOSE, CAPILLARY
GLUCOSE-CAPILLARY: 164 mg/dL — AB (ref 65–99)
GLUCOSE-CAPILLARY: 178 mg/dL — AB (ref 65–99)
GLUCOSE-CAPILLARY: 198 mg/dL — AB (ref 65–99)
GLUCOSE-CAPILLARY: 210 mg/dL — AB (ref 65–99)
GLUCOSE-CAPILLARY: 220 mg/dL — AB (ref 65–99)
GLUCOSE-CAPILLARY: 156 mg/dL — AB (ref 65–99)
GLUCOSE-CAPILLARY: 158 mg/dL — AB (ref 65–99)
GLUCOSE-CAPILLARY: 180 mg/dL — AB (ref 65–99)
GLUCOSE-CAPILLARY: 169 mg/dL — AB (ref 65–99)
GLUCOSE-CAPILLARY: 151 mg/dL — AB (ref 65–99)
Glucose-Capillary: 150 mg/dL — ABNORMAL HIGH (ref 65–99)
Glucose-Capillary: 152 mg/dL — ABNORMAL HIGH (ref 65–99)
Glucose-Capillary: 137 mg/dL — ABNORMAL HIGH (ref 65–99)
Glucose-Capillary: 139 mg/dL — ABNORMAL HIGH (ref 65–99)
Glucose-Capillary: 224 mg/dL — ABNORMAL HIGH (ref 65–99)
Glucose-Capillary: 190 mg/dL — ABNORMAL HIGH (ref 65–99)
Glucose-Capillary: 165 mg/dL — ABNORMAL HIGH (ref 65–99)
Glucose-Capillary: 291 mg/dL — ABNORMAL HIGH (ref 65–99)

## 2016-04-30 LAB — CBC WITH DIFFERENTIAL/PLATELET
BASOS ABS: 0.1 10*3/uL (ref 0.0–0.1)
Basophils Relative: 1 %
EOS PCT: 2 %
Eosinophils Absolute: 0.2 10*3/uL (ref 0.0–0.7)
HEMATOCRIT: 30.1 % — AB (ref 36.0–46.0)
Hemoglobin: 10.2 g/dL — ABNORMAL LOW (ref 12.0–15.0)
LYMPHS ABS: 3.3 10*3/uL (ref 0.7–4.0)
LYMPHS PCT: 33 %
MCH: 29.5 pg (ref 26.0–34.0)
MCHC: 33.9 g/dL (ref 30.0–36.0)
MCV: 87 fL (ref 78.0–100.0)
MONO ABS: 0.5 10*3/uL (ref 0.1–1.0)
MONOS PCT: 5 %
NEUTROS ABS: 6.1 10*3/uL (ref 1.7–7.7)
Neutrophils Relative %: 59 %
PLATELETS: 438 10*3/uL — AB (ref 150–400)
RBC: 3.46 MIL/uL — ABNORMAL LOW (ref 3.87–5.11)
RDW: 12.8 % (ref 11.5–15.5)
WBC: 10.3 10*3/uL (ref 4.0–10.5)

## 2016-04-30 LAB — MRSA PCR SCREENING: MRSA by PCR: POSITIVE — AB

## 2016-04-30 LAB — BASIC METABOLIC PANEL
ANION GAP: 10 (ref 5–15)
ANION GAP: 13 (ref 5–15)
CALCIUM: 8.3 mg/dL — AB (ref 8.9–10.3)
CALCIUM: 8.1 mg/dL — AB (ref 8.9–10.3)
CO2: 22 mmol/L (ref 22–32)
CO2: 20 mmol/L — AB (ref 22–32)
CREATININE: 0.81 mg/dL (ref 0.44–1.00)
Chloride: 103 mmol/L (ref 101–111)
Chloride: 101 mmol/L (ref 101–111)
Creatinine, Ser: 0.77 mg/dL (ref 0.44–1.00)
GFR calc Af Amer: >60 mL/min (ref >60)
GFR calc Af Amer: >60 mL/min (ref >60)
GLUCOSE: 128 mg/dL — AB (ref 65–99)
GLUCOSE: 221 mg/dL — AB (ref 65–99)
Potassium: 3.2 mmol/L — ABNORMAL LOW (ref 3.5–5.1)
Potassium: 3.4 mmol/L — ABNORMAL LOW (ref 3.5–5.1)
Sodium: 135 mmol/L (ref 135–145)
Sodium: 134 mmol/L — ABNORMAL LOW (ref 135–145)

## 2016-04-30 LAB — POTASSIUM: POTASSIUM: 3.4 mmol/L — AB (ref 3.5–5.1)

## 2016-04-30 LAB — TROPONIN I
Troponin I: <0.03 ng/mL (ref <0.031)
Troponin I: <0.03 ng/mL (ref <0.031)

## 2016-04-30 LAB — HEPATIC FUNCTION PANEL
ALK PHOS: 54 U/L (ref 38–126)
ALT: 12 U/L — AB (ref 14–54)
AST: 12 U/L — AB (ref 15–41)
Albumin: 2.2 g/dL — ABNORMAL LOW (ref 3.5–5.0)
Bilirubin, Direct: <0.1 mg/dL — ABNORMAL LOW (ref 0.1–0.5)
TOTAL PROTEIN: 6 g/dL — AB (ref 6.5–8.1)
Total Bilirubin: 0.9 mg/dL (ref 0.3–1.2)

## 2016-04-30 LAB — ECHOCARDIOGRAM COMPLETE
HEIGHTINCHES: 64 in
Weight: 3739 oz

## 2016-04-30 LAB — LIPASE, BLOOD: LIPASE: 30 U/L (ref 11–51)

## 2016-04-30 LAB — CBG MONITORING, ED: Glucose-Capillary: 290 mg/dL — ABNORMAL HIGH (ref 65–99)

## 2016-04-30 LAB — MAGNESIUM: Magnesium: 1.4 mg/dL — ABNORMAL LOW (ref 1.7–2.4)

## 2016-04-30 MED ORDER — LORAZEPAM 2 MG/ML IJ SOLN: 2.0000 mg | INTRAMUSCULAR | Status: DC | PRN

## 2016-04-30 MED ORDER — SODIUM CHLORIDE 0.9 % IV SOLN
INTRAVENOUS | Status: DC
  Administered 2016-04-30: 20:00:00 via INTRAVENOUS

## 2016-04-30 MED ORDER — FOLIC ACID 1 MG PO TABS
1.0000 mg | ORAL_TABLET | Freq: Every day | ORAL | Status: DC
  Administered 2016-04-30 – 2016-05-01 (×2): 1 mg via ORAL
  Filled 2016-04-30 (×2): qty 1

## 2016-04-30 MED ORDER — VITAMIN B-1 100 MG PO TABS
100.0000 mg | ORAL_TABLET | Freq: Every day | ORAL | Status: DC
  Administered 2016-04-30 – 2016-05-01 (×2): 100 mg via ORAL
  Filled 2016-04-30 (×2): qty 1

## 2016-04-30 MED ORDER — INSULIN ASPART 100 UNIT/ML ~~LOC~~ SOLN
0.0000 [IU] | SUBCUTANEOUS | Status: DC
  Administered 2016-04-30: 5 [IU] via SUBCUTANEOUS
  Administered 2016-05-01 (×2): 15 [IU] via SUBCUTANEOUS
  Administered 2016-05-01: 3 [IU] via SUBCUTANEOUS
  Administered 2016-05-01: 11 [IU] via SUBCUTANEOUS

## 2016-04-30 MED ORDER — MUPIROCIN 2 % EX OINT
1.0000 "application " | TOPICAL_OINTMENT | Freq: Two times a day (BID) | CUTANEOUS | Status: DC
  Administered 2016-04-30 – 2016-05-01 (×3): 1 via NASAL
  Filled 2016-04-30: qty 22

## 2016-04-30 MED ORDER — POTASSIUM CHLORIDE 10 MEQ/100ML IV SOLN: 10.0000 meq | INTRAVENOUS | Status: DC

## 2016-04-30 MED ORDER — CHLORHEXIDINE GLUCONATE CLOTH 2 % EX PADS
6.0000 | MEDICATED_PAD | Freq: Every day | CUTANEOUS | Status: DC
  Administered 2016-05-01: 6 via TOPICAL

## 2016-04-30 MED ORDER — POTASSIUM CHLORIDE CRYS ER 20 MEQ PO TBCR
40.0000 meq | EXTENDED_RELEASE_TABLET | Freq: Once | ORAL | Status: AC
  Administered 2016-05-01: 40 meq via ORAL
  Filled 2016-04-30: qty 2

## 2016-04-30 MED ORDER — DEXTROSE 5 % IV SOLN
3.0000 g | Freq: Once | INTRAVENOUS | Status: AC
Start: 2016-04-30 — End: 2016-05-01
  Administered 2016-04-30: 3 g via INTRAVENOUS
  Filled 2016-04-30: qty 6

## 2016-04-30 MED ORDER — GLUCERNA SHAKE PO LIQD: 237.0000 mL | Freq: Three times a day (TID) | ORAL | Status: DC

## 2016-04-30 MED ORDER — HYDROCORTISONE NA SUCCINATE PF 100 MG IJ SOLR
20.0000 mg | Freq: Every day | INTRAMUSCULAR | Status: DC
  Administered 2016-05-01: 20 mg via INTRAVENOUS
  Filled 2016-04-30: qty 2

## 2016-04-30 MED ORDER — ATORVASTATIN CALCIUM 40 MG PO TABS
40.0000 mg | ORAL_TABLET | Freq: Every day | ORAL | Status: DC
  Administered 2016-04-30: 40 mg via ORAL

## 2016-04-30 MED ORDER — POTASSIUM CHLORIDE 10 MEQ/100ML IV SOLN
10.0000 meq | INTRAVENOUS | Status: DC
  Administered 2016-04-30: 10 meq via INTRAVENOUS
  Filled 2016-04-30 (×5): qty 100

## 2016-04-30 MED ORDER — INSULIN GLARGINE 100 UNIT/ML ~~LOC~~ SOLN
10.0000 [IU] | Freq: Once | SUBCUTANEOUS | Status: AC
  Administered 2016-04-30: 10 [IU] via SUBCUTANEOUS
  Filled 2016-04-30: qty 0.1

## 2016-04-30 NOTE — Hospital Discharge Follow-Up (Signed)
Transitional Care Clinic Care Coordination Note:  Admit date:  04/29/16 Discharge date: TBD Discharge Disposition: Home when stable Patient contact: [phone] (mobile) Emergency contact(s): Felecia Tucker-daughter [phone])  This Case Manager reviewed patient's EMR and determined patient would benefit from post-discharge medical management and chronic care management services through the [facility]. Patient has a history of diabetes mellitus type 2, hypertension, alcoholism, hyperlipidemia, COPD/Alpha 1 antitrypsin deficiency. Hospitalized for DKA, abdominal pain with nausea and vomiting, acute renal failure, COPD, right foot diabetic ulcer. Patient has had 3 inpatient admissions in the last six months. This Case Manager met with patient to discuss the services and medical management that can be provided at the [facility]. Patient verbalized understanding and agreed to receive post-discharge care at the [facility].   Patient scheduled for Transitional Care appointment on 05/12/16 at 1130 with Dr. Jarold Song.  Clinic information and appointment time provided to patient. Appointment information also placed on AVS.  Assessment:       Home Environment: Patient lives alone in a private residence.       Support System: Children       Level of functioning: Independent with daily activities though patient indicates she "keeps falling" at home. Patient states she had "4-5 falls in the last week" because her "legs give out." This Case Manager informed patient's bedside RN and Marvetta Gibbons, RN CM. Patient may benefit from a physical therapy evaluation while hospitalized due to frequent falls.       Home DME:  Patient indicates she has a home CPAP machine but has misplaced the mask and tubing. She also indicates she does NOT have a glucometer or diabetes testing supplies at home. This Case Manager informed Marvetta Gibbons, RN CM that patient will need to have a  glucometer and diabetes testing supplies ordered at discharge. Also informed her of patient's need for CPAP mask and tubing.        Home care services: none       Transportation: Patient indicates she normally drives to her medical appointments. She also indicates her children can drive her to her medical appointments if needed.        Food/Nutrition: Patient denies problems affording or obtaining needed food/nutrition.        Medications: Patient indicates she uses the pharmacy at [facility] for her medications. She denies problems obtaining her medications and indicates she was noncompliant with her Levemir because she did not have a glucometer. Inpatient Case Manager aware of patient's need for glucometer and diabetes testing supplies.        Identified Barriers: uninsured-This Case Manager informed patient she would benefit from meeting with Development worker, community at [facility] to determine if eligible for the [employer]. Patient informed of Development worker, community Walk-in hours. May also benefit from going to DSS to determine if eligible for Medicaid. Patient appreciative of information. In addition, patient would benefit from PT evaluation due to frequent falls and glucometer, diabetes testing supplies needed.        PCP: Dr. Adrian Blackwater              Arranged services:        Services communicated to Marvetta Gibbons, RN CM

## 2016-04-30 NOTE — Progress Notes (Signed)
Insulin drip stopped per MD ordered 100 cc of insulin wasted in sink and tubing placed in black bin. Witnessed by [facility] RN.

## 2016-04-30 NOTE — Care Management Note (Signed)
Case Management Note Donn Pierini RN, BSN Unit 2W-Case Manager [phone]  Patient Details  Name: Carla Hicks MRN: [id number] Date of Birth: Dec 16, 1971  Subjective/Objective:  Pt admitted with DKA, hx of non-compliance                   Action/Plan: PTA pt lived at home- PCP-FUNCHES, JOSALYN at [facility]-  On last admission left AMA- CM to follow for d/c needs.   Expected Discharge Date:                  Expected Discharge Plan:  Home/Self Care  In-House Referral:     Discharge planning Services  CM Consult  Post Acute Care Choice:    Choice offered to:     DME Arranged:    DME Agency:     HH Arranged:    HH Agency:     Status of Service:  In process, will continue to follow  Medicare Important Message Given:    Date Medicare IM Given:    Medicare IM give by:    Date Additional Medicare IM Given:    Additional Medicare Important Message give by:     If discussed at Long Length of Stay Meetings, dates discussed:    Additional Comments:  Darrold Span, RN 04/30/2016, 11:27 AM

## 2016-04-30 NOTE — Progress Notes (Signed)
Patient was to start on Levemir 18 units BID at discharge earlier this month.  Recommend transitioning off drip with Levemir 15-18 units BID to start and then titrate as needed.(106 kg x 0.2 units/kg = 21) Also recommend Novolog MODERATE correction scale TID & HS when eating. Will continue to monitor blood sugars while in the hospital. Smith Mince RN BSN CDE

## 2016-04-30 NOTE — Progress Notes (Signed)
  Echocardiogram 2D Echocardiogram has been performed.  Arvil Chaco 04/30/2016, 3:54 PM

## 2016-04-30 NOTE — Clinical Social Work Note (Signed)
CSW acknowledges social work consult. RNCM will handle financial issues with obtaining medication and any medical supplies.  CSW signing off. Please consult if any other social work needs arise.  Charlynn Court, CSW [phone]

## 2016-04-30 NOTE — Progress Notes (Signed)
PROGRESS NOTE    Carla Hicks  ZOX:[id number] DOB: 04-05-1971 DOA: 04/29/2016 PCP: Lora Paula, MD (Confirm with patient/family/NH records and if not entered, this HAS to be entered at [facility] point of entry. "No PCP" if truly none.)   Brief Narrative:  Carla Hicks is a 45 y.o. BF PMHx EtOH abuse, who drinks approximately 12 beers a day, Chronic Diastolic CHF, morbid obesity, DM type II uncontrolled with, complications, Dyslipidemia,   Presenting to the emergency department with complaints of abdominal pain. She reported drinking 2 x 40 ounce bottles of beer yesterday after which she developed severe epigastric abdominal pain radiating to her back in a bandlike pattern, associate with multiple episodes of nausea and vomiting. Symptoms persisted however she continued to drink beer last night. She denies hematemesis or bloody stools. She denies having previous episodes of pancreatitis. She also denies having history of alcohol abuse or delirium tremens.  ED Course: Workup in the emergency department revealed a lipase level of 1721. Further workup in the emergency department included a right upper quadrant ultrasound which did not reveal gallstones or gallbladder wall thickening. She was given IV narcotic analgesics in the emergency room for severe pain.   Assessment & Plan:   Principal Problem:   DKA, type 2 (HCC) Active Problems:   Essential hypertension, benign   Acute renal failure (ARF) (HCC)   Dyslipidemia associated with type 2 diabetes mellitus (HCC)   Severe obesity (BMI >= 40) (HCC)   Alcohol abuse   Nausea & vomiting   Chronic diastolic CHF (congestive heart failure) (HCC)   Alcohol-induced chronic pancreatitis (HCC)   Hypomagnesemia   Uncontrolled type 2 diabetes mellitus with complication (HCC)   Noncompliance   HLD (hyperlipidemia)   Pulmonary hypertension (HCC)   Acute alcohol-induced pancreatitis. -Patient noncompliant with DM medication, not on DM diet  and continues to drink significant amounts of alcohol. - Pancreatitis resolved. - Abdominal ultrasound did not reveal evidence of gallstones.   Alcohol abuse.  -Spoke at length with patient concerning her alcoholism and its effect on her uncontrolled diabetes. Counseled patient on sequela of continuing this lifestyle to include blindness, kidney failure, neuropathy, death.  -Continue CIWA protocol    Hypokalemia. - Likely secondary to GI losses from nausea and vomiting caused by acute pancreatitis.  -Potassium goal> 4 -Potassium IV 50 mEq  Hypomagnesemia -Magnesium goal>2 -Magnesium IV 3 gm  DM type 2 uncontrolled with complication (peripheral neuropathy)/Noncompliance  -Lantus 10 units; continue glucose stabilizer for 2 hours post-administration of Lantus. -Moderate SSI -CSW consult; help with obtaining glucometer and medication  -Nutrition consult; DM nutrition guidance  -Diabetic coordinator consult placed  HLD -patient not within ADA guidelines -Start Lipitor 40 mg daily  Chronic diastolic CHF/Pulmonary Hypertension -DC Catapres as this is not indicated for CHF. HCTZ -In Am will start appropriate medication for CHF patient i.e. beta blocker and/or ACEI  Hypertension.  -See CHF     DVT prophylaxis:  Lovenox  Code Status:  Full Family Communication:  None Disposition Plan:  Resolution DKA    Consultants:  NA   Procedures/Significant Events:  07/30/2010 echocardiogram;- LVEF= 55% to 60%. - (grade 2 diastolic dysfunction). -Pulmonary arteries: PA peak pressure: 39mm Hg (S).   Cultures 5/11 MRSA by PCR positive  Antimicrobials:  NA  Devices NA   LINES / TUBES:  NA    Continuous Infusions: . sodium chloride Stopped (04/30/16 0128)  . dextrose 5 % and 0.45% NaCl 125 mL/hr at 04/30/16 0800  . insulin (  NOVOLIN-R) infusion 6.5 Units/hr (04/30/16 1615)     Subjective: 5/11 A/O 4, states has not been taking insulin for last 2 days secondary to not  having glucometer. Was instructed at [facility] on amount of insulin to use however was not provided glucometer. Stated began to feel lightheaded and positive scotoma therefore did not continue with instructed insulin dose. States has never had diabetic eye exam.    Objective: Filed Vitals:   04/30/16 0727 04/30/16 0737 04/30/16 1213 04/30/16 1457  BP:  89/57 136/79   Pulse:  86 84   Temp:  99 F (37.2 C) 98 F (36.7 C) 98.6 F (37 C)  TempSrc:  Oral Oral Oral  Resp:  16 23   Height:      Weight:      SpO2: 100% 97% 95%     Intake/Output Summary (Last 24 hours) at 04/30/16 1827 Last data filed at 04/30/16 0415  Gross per 24 hour  Intake 1452.59 ml  Output    700 ml  Net 752.59 ml   Filed Weights   04/30/16 0052  Weight: 106 kg (233 lb 11 oz)    Examination:  General: A/O 4, NAD, No acute respiratory distress Eyes: negative scleral hemorrhage, negative anisocoria, negative icterus ENT: Negative Runny nose, negative gingival bleeding, Neck:  Negative scars, masses, torticollis, lymphadenopathy, JVD Lungs: Clear to auscultation bilaterally without wheezes or crackles Cardiovascular: Regular rate and rhythm without murmur gallop or rub normal S1 and S2 Abdomen: negative abdominal pain, nondistended, positive soft, bowel sounds, no rebound, no ascites, no appreciable mass Extremities: No significant cyanosis, clubbing, or edema bilateral lower extremities Skin: Negative rashes, lesions, ulcers, multiple tattoos Psychiatric:  Negative depression, negative anxiety, negative fatigue, negative mania  Central nervous system:  Cranial nerves II through XII intact, tongue/uvula midline, all extremities muscle strength 5/5, sensation intact throughout except for bilateral lower extremity neuropathy, negative dysarthria, negative expressive aphasia, negative receptive aphasia.  .     Data Reviewed: Care during the described time interval was provided by me .  I have reviewed  this patient's available data, including medical history, events of note, physical examination, and all test results as part of my evaluation. I have personally reviewed and interpreted all radiology studies.  CBC:  Recent Labs Lab 04/29/16 1335 04/29/16 2050 04/30/16 0437  WBC 11.6* 11.3* 10.3  NEUTROABS  --   --  6.1  HGB 12.4 12.6 10.2*  HCT 37.2 37.7 30.1*  MCV 91.6 90.8 87.0  PLT 555* 515* 438*   Basic Metabolic Panel:  Recent Labs Lab 04/29/16 1335 04/29/16 1740 04/29/16 2050 04/30/16 0438 04/30/16 0809 04/30/16 1438  NA 128* 126* 130* 135 134*  --   K 4.8 4.6 4.0 3.2* 3.4* 3.4*  CL 90* 87* 93* 103 101  --   CO2 18* 17* 17* 22 20*  --   GLUCOSE 673* 758* 500* 128* 221*  --   BUN 10 10 9  <5* <5*  --   CREATININE 1.39* 1.48* 1.32* 0.81 0.77  --   CALCIUM 9.3 9.1 9.1 8.3* 8.1*  --   MG  --   --   --   --   --  1.4*   GFR: Estimated Creatinine Clearance: 106.5 mL/min (by C-G formula based on Cr of 0.77). Liver Function Tests:  Recent Labs Lab 04/29/16 1335 04/30/16 0437  AST 17 12*  ALT 17 12*  ALKPHOS 73 54  BILITOT 1.8* 0.9  PROT 7.7 6.0*  ALBUMIN 2.7* 2.2*  Recent Labs Lab 04/29/16 1335 04/30/16 0437  LIPASE 40 30   No results for input(s): AMMONIA in the last 168 hours. Coagulation Profile: No results for input(s): INR, PROTIME in the last 168 hours. Cardiac Enzymes:  Recent Labs Lab 04/29/16 1740 04/29/16 2050 04/30/16 0437 04/30/16 0809  TROPONINI <0.03 0.03 <0.03 <0.03   BNP (last 3 results) No results for input(s): PROBNP in the last 8760 hours. HbA1C: No results for input(s): HGBA1C in the last 72 hours. CBG:  Recent Labs Lab 04/30/16 1004 04/30/16 1111 04/30/16 1212 04/30/16 1319 04/30/16 1417  GLUCAP 210* 220* 190* 156* 158*   Lipid Profile: No results for input(s): CHOL, HDL, LDLCALC, TRIG, CHOLHDL, LDLDIRECT in the last 72 hours. Thyroid Function Tests: No results for input(s): TSH, T4TOTAL, FREET4, T3FREE,  THYROIDAB in the last 72 hours. Anemia Panel: No results for input(s): VITAMINB12, FOLATE, FERRITIN, TIBC, IRON, RETICCTPCT in the last 72 hours. Urine analysis:    Component Value Date/Time   COLORURINE YELLOW 04/29/2016 1750   APPEARANCEUR HAZY* 04/29/2016 1750   LABSPEC 1.028 04/29/2016 1750   PHURINE 5.0 04/29/2016 1750   GLUCOSEU >1000* 04/29/2016 1750   HGBUR SMALL* 04/29/2016 1750   BILIRUBINUR NEGATIVE 04/29/2016 1750   BILIRUBINUR neg 09/18/2015 1131   KETONESUR >80* 04/29/2016 1750   PROTEINUR NEGATIVE 04/29/2016 1750   PROTEINUR 30 09/18/2015 1131   UROBILINOGEN 0.2 09/18/2015 1131   UROBILINOGEN 0.2 12/11/2014 1522   NITRITE NEGATIVE 04/29/2016 1750   NITRITE neg 09/18/2015 1131   LEUKOCYTESUR SMALL* 04/29/2016 1750   Sepsis Labs: @LABRCNTIP (procalcitonin:4,lacticidven:4)  ) Recent Results (from the past 240 hour(s))  MRSA PCR Screening     Status: Abnormal   Collection Time: 04/30/16 12:45 AM  Result Value Ref Range Status   MRSA by PCR POSITIVE (A) NEGATIVE Final    Comment:        The GeneXpert MRSA Assay (FDA approved for NASAL specimens only), is one component of a comprehensive MRSA colonization surveillance program. It is not intended to diagnose MRSA infection nor to guide or monitor treatment for MRSA infections. RESULT CALLED TO, READ BACK BY AND VERIFIED WITH: TIM IRBY,RN @0510  04/30/16 [facility]          Radiology Studies: Dg Chest 2 View  04/29/2016  CLINICAL DATA:  Epigastric pain, shortness of breath, cough for 2 days, diabetes mellitus, asthma, former smoker EXAM: CHEST  2 VIEW COMPARISON:  12/27/2015 FINDINGS: Upper normal heart size. Mediastinal contours and pulmonary vascularity normal. Minimal RIGHT basilar atelectasis. Lungs otherwise clear. No pleural effusion or pneumothorax. Bones unremarkable. IMPRESSION: Minimal RIGHT basilar atelectasis. Electronically Signed   By: [name] M.D.   On: 04/29/2016 17:26   US Abdomen  Complete  04/29/2016  CLINICAL DATA:  Abdominal pain for 3 weeks. Patient with history of pancreatitis as seen on the CT scan 04/16/2016. Initial encounter. EXAM: ABDOMEN ULTRASOUND COMPLETE COMPARISON:  CT abdomen and pelvis and abdominal ultrasound 04/16/2016 FINDINGS: Gallbladder: Sludge is seen in the gallbladder. No stones, wall thickening or pericholecystic fluid. Sonographer reports negative Murphy's sign. Common bile duct: Diameter: 0.4 cm Liver: Increased echogenicity is consistent with fatty infiltration. No focal lesion. IVC: No abnormality visualized. Pancreas: Appears somewhat edematous with decreased echogenicity seen. No fluid collection is identified. Spleen: Size and appearance within normal limits. Right Kidney: Length: 11.7 cm. Echogenicity within normal limits. No hydronephrosis visualized. 1 cm simple cyst incidentally noted. Left Kidney: Length: 12.3 cm. Echogenicity within normal limits. No mass or hydronephrosis visualized. Abdominal aorta: No aneurysm  visualized. Other findings: None. IMPRESSION: The pancreas appears somewhat edematous suggestive of pancreatitis. No focal lesion or peripancreatic fluid collection is identified. Diffuse fatty infiltration of the liver. Small volume of sludge in the gallbladder. Negative for stones or cholecystitis. Electronically Signed   By: Drusilla Kanner M.D.   On: 04/29/2016 21:53   Dg Foot Complete Right  04/29/2016  CLINICAL DATA:  Stepped on nail with RIGHT foot 3 weeks ago, RIGHT foot pain, diabetes mellitus, initial encounter EXAM: RIGHT FOOT COMPLETE - 3+ VIEW COMPARISON:  None FINDINGS: Osseous mineralization normal. Joint spaces preserved. Soft tissue swelling at medial RIGHT foot. Tiny plantar calcaneal spur. No acute fracture, dislocation or bone destruction. No definite soft tissue gas. Scattered small vessel vascular calcifications. IMPRESSION: No definite acute bony abnormalities. Electronically Signed   By: [name] M.D.   On:  04/29/2016 17:27        Scheduled Meds: . atorvastatin  40 mg Oral q1800  . [START ON 05/01/2016] Chlorhexidine Gluconate Cloth  6 each Topical Q0600  . enoxaparin (LOVENOX) injection  40 mg Subcutaneous Q24H  . [START ON 05/01/2016] feeding supplement (GLUCERNA SHAKE)  237 mL Oral TID BM  . folic acid  1 mg Oral Daily  . gabapentin  300 mg Oral BID  . gemfibrozil  600 mg Oral BID AC  . [START ON 05/01/2016] hydrocortisone sod succinate (SOLU-CORTEF) inj  20 mg Intravenous Daily  . insulin aspart  0-15 Units Subcutaneous Q4H  . magnesium sulfate 1 - 4 g bolus IVPB  3 g Intravenous Once  . mometasone-formoterol  2 puff Inhalation BID  . multivitamin with minerals  1 tablet Oral Daily  . mupirocin ointment  1 application Nasal BID  . pantoprazole (PROTONIX) IV  40 mg Intravenous Q12H  . potassium chloride  10 mEq Intravenous Q1 Hr x 5  . thiamine  100 mg Oral Daily   Continuous Infusions: . sodium chloride Stopped (04/30/16 0128)  . dextrose 5 % and 0.45% NaCl 125 mL/hr at 04/30/16 0800  . insulin (NOVOLIN-R) infusion 6.5 Units/hr (04/30/16 1615)     LOS: 1 day    Time spent: 40 minutes    Malaysha Arlen, Roselind Messier, MD [facility] Pager [phone]   If 7PM-7AM, please contact night-coverage [email or web address] Password [facility] 04/30/2016, 6:27 PM

## 2016-04-30 NOTE — Progress Notes (Addendum)
Received Diabetes Coordinator consult. Spoke with patient about her diabetes. States that she takes Metformin and Glucotrol at home. Was to start on Levemir 18 units BID at home after last discharge on 04/21/16,  but did not start due to the fact that she did not have a blood glucose meter. She also was afraid of hypoglycemia.  States that she has 2 vials of Levemir in her refrigerator now and understands that she is back in the hospital because she did not take her insulin.  She is an established patient at [facility]. She can get a home blood glucose meter with a written presecription at [facility].  She was given information about the [employer] meter and strips. 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 Will continue to follow while in the hospital. Smith Mince RN BSN CDE

## 2016-05-01 DIAGNOSIS — K860 Alcohol-induced chronic pancreatitis: Secondary | ICD-10-CM

## 2016-05-01 DIAGNOSIS — N179 Acute kidney failure, unspecified: Secondary | ICD-10-CM

## 2016-05-01 DIAGNOSIS — I5032 Chronic diastolic (congestive) heart failure: Secondary | ICD-10-CM

## 2016-05-01 DIAGNOSIS — Z9119 Patient's noncompliance with other medical treatment and regimen: Secondary | ICD-10-CM

## 2016-05-01 DIAGNOSIS — E1310 Other specified diabetes mellitus with ketoacidosis without coma: Principal | ICD-10-CM

## 2016-05-01 LAB — COMPREHENSIVE METABOLIC PANEL
ALBUMIN: 2.1 g/dL — AB (ref 3.5–5.0)
ALK PHOS: 52 U/L (ref 38–126)
ALT: 13 U/L — AB (ref 14–54)
ANION GAP: 12 (ref 5–15)
AST: 18 U/L (ref 15–41)
BILIRUBIN TOTAL: 0.6 mg/dL (ref 0.3–1.2)
BUN: <5 mg/dL — ABNORMAL LOW (ref 6–20)
CALCIUM: 8.7 mg/dL — AB (ref 8.9–10.3)
CO2: 22 mmol/L (ref 22–32)
CREATININE: 0.71 mg/dL (ref 0.44–1.00)
Chloride: 101 mmol/L (ref 101–111)
GFR calc Af Amer: >60 mL/min (ref >60)
GFR calc non Af Amer: >60 mL/min (ref >60)
Glucose, Bld: 260 mg/dL — ABNORMAL HIGH (ref 65–99)
Potassium: 3 mmol/L — ABNORMAL LOW (ref 3.5–5.1)
SODIUM: 135 mmol/L (ref 135–145)
TOTAL PROTEIN: 6.2 g/dL — AB (ref 6.5–8.1)

## 2016-05-01 LAB — GLUCOSE, CAPILLARY
GLUCOSE-CAPILLARY: 390 mg/dL — AB (ref 65–99)
Glucose-Capillary: 308 mg/dL — ABNORMAL HIGH (ref 65–99)
Glucose-Capillary: 187 mg/dL — ABNORMAL HIGH (ref 65–99)
Glucose-Capillary: 392 mg/dL — ABNORMAL HIGH (ref 65–99)
Glucose-Capillary: 435 mg/dL — ABNORMAL HIGH (ref 65–99)

## 2016-05-01 LAB — PHOSPHORUS: Phosphorus: 1.6 mg/dL — ABNORMAL LOW (ref 2.5–4.6)

## 2016-05-01 LAB — MAGNESIUM: Magnesium: 2.2 mg/dL (ref 1.7–2.4)

## 2016-05-01 LAB — HEMOGLOBIN A1C
HEMOGLOBIN A1C: 9.7 % — AB (ref 4.8–5.6)
MEAN PLASMA GLUCOSE: 232 mg/dL

## 2016-05-01 MED ORDER — INSULIN GLARGINE 100 UNIT/ML ~~LOC~~ SOLN: 14.0000 [IU] | Freq: Two times a day (BID) | SUBCUTANEOUS | Status: DC

## 2016-05-01 MED ORDER — LANCETS MISC: 1.0000 | Freq: Three times a day (TID) | Status: DC

## 2016-05-01 MED ORDER — GLUCOSE BLOOD VI STRP: ORAL_STRIP | Status: DC

## 2016-05-01 MED ORDER — INSULIN DETEMIR 100 UNIT/ML ~~LOC~~ SOLN
16.0000 [IU] | Freq: Two times a day (BID) | SUBCUTANEOUS | Status: DC
  Administered 2016-05-01: 16 [IU] via SUBCUTANEOUS
  Filled 2016-05-01 (×3): qty 0.16

## 2016-05-01 MED ORDER — IBUPROFEN 200 MG PO TABS
200.0000 mg | ORAL_TABLET | Freq: Four times a day (QID) | ORAL | Status: AC | PRN
Start: 2016-05-01 — End: ?

## 2016-05-01 MED ORDER — POTASSIUM CHLORIDE CRYS ER 20 MEQ PO TBCR: 40.0000 meq | EXTENDED_RELEASE_TABLET | Freq: Once | ORAL | Status: DC

## 2016-05-01 MED ORDER — PANTOPRAZOLE SODIUM 40 MG PO TBEC: 40.0000 mg | DELAYED_RELEASE_TABLET | Freq: Every day | ORAL | Status: DC

## 2016-05-01 MED ORDER — INSULIN ASPART 100 UNIT/ML ~~LOC~~ SOLN: 0.0000 [IU] | Freq: Every day | SUBCUTANEOUS | Status: DC

## 2016-05-01 MED ORDER — INSULIN ASPART 100 UNIT/ML ~~LOC~~ SOLN: 0.0000 [IU] | Freq: Three times a day (TID) | SUBCUTANEOUS | Status: DC

## 2016-05-01 MED ORDER — ALBUTEROL SULFATE (2.5 MG/3ML) 0.083% IN NEBU: 2.5000 mg | INHALATION_SOLUTION | RESPIRATORY_TRACT | Status: DC | PRN

## 2016-05-01 MED ORDER — PREDNISONE 20 MG PO TABS: 20.0000 mg | ORAL_TABLET | Freq: Every day | ORAL | Status: DC

## 2016-05-01 MED ORDER — BLOOD GLUCOSE MONITOR KIT: PACK | Status: DC

## 2016-05-01 MED FILL — TRUE METRIX TEST STRIP: 25 days supply | Qty: 100 | Fill #0

## 2016-05-01 MED FILL — !TRUE METRIX BLOOD GLUCOSE: 1 days supply | Qty: 1 | Fill #0

## 2016-05-01 MED FILL — TRUEplus LANCETS 28G MISC: 25 days supply | Qty: 100 | Fill #0

## 2016-05-01 NOTE — Care Management Note (Addendum)
Case Management Note  Patient Details  Name: MIYAH HAMPSHIRE MRN: [id number] Date of Birth: August 03, 1971  Subjective/Objective:      Patient is from home, pta indep, patient goes to [facility], she has follow up appt, and she has levemere insulin at home.  She  Need scripts for gluocmeter , lancets and strips.  MD  Aware.  She will go to [facility] to pick these up today.  Patient has cpap mask and tubing from resp.               Action/Plan:   Expected Discharge Date:                  Expected Discharge Plan:  Home/Self Care  In-House Referral:     Discharge planning Services  CM Consult  Post Acute Care Choice:    Choice offered to:     DME Arranged:    DME Agency:     HH Arranged:    HH Agency:     Status of Service:  Completed, signed off  Medicare Important Message Given:    Date Medicare IM Given:    Medicare IM give by:    Date Additional Medicare IM Given:    Additional Medicare Important Message give by:     If discussed at Long Length of Stay Meetings, dates discussed:    Additional Comments:  Leone Haven, RN 05/01/2016, 4:27 PM

## 2016-05-01 NOTE — Progress Notes (Addendum)
  RD consulted for nutrition education regarding diabetes. Patient was educated regarding diabetes diet during recent admission at [facility]. She reports no prior diet education.   Lab Results  Component Value Date   HGBA1C 9.7* 04/29/2016    Wt Readings from Last 10 Encounters:  04/30/16 233 lb 11 oz (106 kg)  12/27/15 257 lb 12.8 oz (116.937 kg)  12/16/15 256 lb 2.8 oz (116.2 kg)  10/29/15 263 lb (119.296 kg)  09/18/15 262 lb (118.842 kg)  08/01/15 259 lb (117.482 kg)  08/01/15 256 lb (116.121 kg)  07/17/15 256 lb 6.4 oz (116.302 kg)  07/02/15 259 lb 3.2 oz (117.572 kg)  04/24/15 243 lb 12.8 oz (110.587 kg)    RD provided "Carbohydrate Counting for People with Diabetes" handout from the Academy of Nutrition and Dietetics. Discussed different food groups and their effects on blood sugar, emphasizing carbohydrate-containing foods. Provided list of carbohydrates and recommended serving sizes of common foods.  Discussed importance of controlled and consistent carbohydrate intake throughout the day. Provided examples of ways to balance meals/snacks and encouraged intake of high-fiber, whole grain complex carbohydrates. Teach back method used.  Expect poor compliance. Patient on the phone during entire RD visit, did not seem motivated to make any dietary changes.  Body mass index is 40.09 kg/(m^2). Pt meets criteria for class 3, extreme/morbid obesity based on current BMI. Patient with some weight loss over the past 6 months, desirable given morbid obesity.   Current diet order is CHO modified, patient is consuming approximately 100% of meals at this time. Labs and medications reviewed. No further nutrition interventions warranted at this time. RD contact information provided. If additional nutrition issues arise, please re-consult RD.   Joaquin Courts, RD, LDN, CNSC Pager [phone] After Hours Pager [phone]

## 2016-05-01 NOTE — Progress Notes (Signed)
Pt given discharge instructions and reviewed with patient. Removed PIV without difficulty. Pt discharged via wheel chair to home at 1635. Pt acknowledged understanding of information provided.

## 2016-05-01 NOTE — Discharge Instructions (Signed)
Diabetic Ketoacidosis °Diabetic ketoacidosis is a life-threatening complication of diabetes. If it is not treated, it can cause severe dehydration and organ damage and can lead to a coma or death. °CAUSES °This condition develops when there is not enough of the hormone insulin in the body. Insulin helps the body to break down sugar for energy. Without insulin, the body cannot break down sugar, so it breaks down fats instead. This leads to the production of acids that are called ketones. Ketones are poisonous at high levels. °This condition can be triggered by: °· Stress on the body that is brought on by an illness. °· Medicines that raise blood glucose levels. °· Not taking diabetes medicine. °SYMPTOMS °Symptoms of this condition include: °· Fatigue. °· Weight loss. °· Excessive thirst. °· Light-headedness. °· Fruity or sweet-smelling breath. °· Excessive urination. °· Vision changes. °· Confusion or irritability. °· Nausea. °· Vomiting. °· Rapid breathing. °· Abdominal pain. °· Feeling flushed. °DIAGNOSIS °This condition is diagnosed based on a medical history, a physical exam, and blood tests. You may also have a urine test that checks for ketones. °TREATMENT °This condition may be treated with: °· Fluid replacement. This may be done to correct dehydration. °· Insulin injections. These may be given through the skin or through an IV tube. °· Electrolyte replacement. Electrolytes, such as potassium and sodium, may be given in pill form or through an IV tube. °· Antibiotic medicines. These may be prescribed if your condition was caused by an infection. °HOME CARE INSTRUCTIONS °Eating and Drinking °· Drink enough fluids to keep your urine clear or pale yellow. °· If you cannot eat, alternate between drinking fluids with sugar (such as juice) and salty fluids (such as broth or bouillon). °· If you can eat, follow your usual diet and drink sugar-free liquids, such as water. °Other Instructions °· Take insulin as  directed by your health care provider. Do not skip insulin injections. Do not use expired insulin. °· If your blood sugar is over 240 mg/dL, monitor your urine ketones every 4-6 hours. °· If you were prescribed an antibiotic medicine, finish all of it even if you start to feel better. °· Rest and exercise only as directed by your health care provider. °· If you get sick, call your health care provider and begin treatment quickly. Your body often needs extra insulin to fight an illness. °· Check your blood glucose levels regularly. If your blood glucose is high, drink plenty of fluids. This helps to flush out ketones. °SEEK MEDICAL CARE IF: °· Your blood glucose level is too high or too low. °· You have ketones in your urine. °· You have a fever. °· You cannot eat. °· You cannot tolerate fluids. °· You have been vomiting for more than 2 hours. °· You continue to have symptoms of this condition. °· You develop new symptoms. °SEEK IMMEDIATE MEDICAL CARE IF: °· Your blood glucose levels continue to be high (elevated). °· Your monitor reads "high" even when you are taking insulin. °· You faint. °· You have chest pain. °· You have trouble breathing. °· You have a sudden, severe headache. °· You have sudden weakness in one arm or one leg. °· You have sudden trouble speaking or swallowing. °· You have vomiting or diarrhea that gets worse after 3 hours. °· You feel severely fatigued. °· You have trouble thinking. °· You have abdominal pain. °· You are severely dehydrated. Symptoms of severe dehydration include: °¨ Extreme thirst. °¨ Dry mouth. °¨ Blue lips. °¨   Cold hands and feet. °¨ Rapid breathing. °  °This information is not intended to replace advice given to you by your health care provider. Make sure you discuss any questions you have with your health care provider. °  °Document Released: 12/04/2000 Document Revised: 04/23/2015 Document Reviewed: 11/14/2014 °Elsevier Interactive Patient Education ©2016 [employer] ° °

## 2016-05-01 NOTE — Discharge Summary (Deleted)
DISCHARGE SUMMARY  Carla Hicks  MR#: [id number]  DOB:1971/02/06  Date of Admission: 04/29/2016 Date of Discharge: 05/01/2016  Attending Physician:MCCLUNG,JEFFREY T  Patient's RFX:JOITGPQ, [name], MD  Consults:  none  Disposition: d/c home at insistence of patient - pt advised this was unwise and unsafe, and educated on high risk of serious medical illness recurring, and possible leading to her death   Follow-up Appts:     Follow-up Information    Follow up with [facility] On 05/12/2016.   Why:  [facility] appointment on 05/12/16 at 11:30 am with Dr. Jarold Song.   Contact information:   [place] [phone]      Tests Needing Follow-up: -monitor CBGs -recheck lipids -recheck R foot wound   Discharge Diagnoses: DKA in uncontrolled DM 2 Abdom pain w/ recent Acute alcohol-induced pancreatitis Acute renal failure Alcohol abuse Alpha-1 antitrypsin deficiency Right foot diabetic ulcer Hypokalemia Hypomagnesemia HLD HTN / Chronic diastolic CHF / Pulmonary Hypertension MRSA screen +     Initial presentation: 45 y.o. F Hx EtOH abuse who drinks approximately 12 beers QD, Chronic Diastolic CHF, morbid obesity, DM2, alpha-one antitrypsin deficiency, and Dyslipidemia who presented to the ED with complaints of persistent abdominal pain after having been admitted for pancreatitis 4/27 > 5/02 . Patient reported multiple episodes of vomiting since her d/c. Patient had not been taking insulin "because she didn't have a CBG meter." In the ER patient's blood sugar was found to be in the 700s with elevated anion gap and positive beta hydroxybutyrate. Urine ketones were positive.   Hospital Course:  DKA in uncontrolled DM 2 -noncompliant with DM medication, not on DM diet -CBG remains very poorly controlled - was not written for basal insulin today - resumed levemir on prior intended BID  basis - had no intention to d/c pt home today, but she insisted - rather than forcing her to leave AMA w/o scripts, I d/c'ed her at her request, w/ my explanation that it was unsafe and not in her best interest - advised her to follow her CBG 4x a day at home and to return to the ED immediately if her CBG is consistently > 400 or < 70  Abdom pain w/ recent Acute alcohol-induced pancreatitis -Abdominal ultrasound did not reveal evidence of gallstones -lipase normal th/o this admission  -acute episode was during prior admission (4/27-5/02)  Last Labs      Recent Labs Lab 04/29/16 1335 04/30/16 0437  LIPASE 40 30     Acute renal failure Pre-renal in setting of DKA - resolved w/ volume resuscitation   Last Labs      Recent Labs Lab 04/29/16 1740 04/29/16 2050 04/30/16 0438 04/30/16 0809 05/01/16 0421  CREATININE 1.48* 1.32* 0.81 0.77 0.71      Alcohol abuse -Dr. Sherral Hammers spoke at length with patient concerning her alcoholism and its effect on her uncontrolled diabetes, and counseled patient on sequela of continuing this lifestyle to include blindness, kidney failure, neuropathy, death.  -no evidence of withdrawal whatsoever at time pt insisted on being D/C - she was alert, oriented, and calm  Alpha-1 antitrypsin deficiency -followed by Dr. Melvyn Novas - on prednisone 23m QD long term - explained absolute need to avoid EtOH and tobacco   Right foot diabetic ulcer no active discharge - close follow-up as outpatient - patient stated she had tetanus toxoid December 2016  Hypokalemia -secondary to GI losses from nausea and vomiting, poor nutritional status  due to alcoholism, and cellular shift w/ tx of hyperglycemia - cont to replace  Hypomagnesemia -replaced to normal range  HLD -lipid panel 4/28 not reliable due to high triglycerides   HTN / Chronic diastolic CHF / Pulmonary Hypertension -BP currently well controlled  MRSA screen +         Medication  List    STOP taking these medications        acetaminophen-codeine 300-30 MG tablet  Commonly known as:  TYLENOL #3     famotidine 20 MG tablet  Commonly known as:  PEPCID     glipiZIDE 5 MG tablet  Commonly known as:  GLUCOTROL     metFORMIN 500 MG tablet  Commonly known as:  GLUCOPHAGE     oxyCODONE-acetaminophen 7.5-325 MG tablet  Commonly known as:  PERCOCET      TAKE these medications        albuterol (5 MG/ML) 0.5% nebulizer solution  Commonly known as:  PROVENTIL  Take 0.5 mLs (2.5 mg total) by nebulization every 4 (four) hours as needed for wheezing or shortness of breath. Sob     atorvastatin 20 MG tablet  Commonly known as:  LIPITOR  Take 1 tablet (20 mg total) by mouth daily at 6 PM.     blood glucose meter kit and supplies Kit  Dispense based on patient and insurance preference. Use up to four times daily as directed. (FOR ICD-9 250.00, 250.01).     budesonide-formoterol 80-4.5 MCG/ACT inhaler  Commonly known as:  SYMBICORT  Take 2 puffs first thing in am and then another 2 puffs about 12 hours later.     cloNIDine 0.1 MG tablet  Commonly known as:  CATAPRES  Take 1 tablet (0.1 mg total) by mouth 2 (two) times daily.     diclofenac sodium 1 % Gel  Commonly known as:  VOLTAREN  Apply 1 application topically 4 (four) times daily.     FLUTTER Devi  Use as directed     gabapentin 300 MG capsule  Commonly known as:  NEURONTIN  Take 1 capsule (300 mg total) by mouth 2 (two) times daily.     gemfibrozil 600 MG tablet  Commonly known as:  LOPID  Take 1 tablet (600 mg total) by mouth 2 (two) times daily before a meal.     insulin detemir 100 UNIT/ML injection  Commonly known as:  LEVEMIR  Inject 0.18 mLs (18 Units total) into the skin 2 (two) times daily.     Insulin Pen Needle 29G X 13MM Misc  1 Device by Does not apply route 4 (four) times daily - after meals and at bedtime.     INSULIN SYRINGE 1CC/29G 29G X 1/2" 1 ML Misc  1 Syringe by Does not  apply route 4 (four) times daily - after meals and at bedtime.     multivitamin with minerals Tabs tablet  Take 1 tablet by mouth daily.     pantoprazole 40 MG tablet  Commonly known as:  PROTONIX  Take 1 tablet (40 mg total) by mouth daily. Take 30-60 min before first meal of the day     potassium chloride SA 20 MEQ tablet  Commonly known as:  K-DUR,KLOR-CON  Take 1 tablet (20 mEq total) by mouth daily.     predniSONE 20 MG tablet  Commonly known as:  DELTASONE  Take 1 tablet (20 mg total) by mouth daily with breakfast.        Day of Discharge BP 110/68  mmHg  Pulse 83  Temp(Src) 98.6 F (37 C) (Oral)  Resp 24  Ht 5' 4" (1.626 m)  Wt 106 kg (233 lb 11 oz)  BMI 40.09 kg/m2  SpO2 100%  LMP 03/24/2016  Physical Exam: General: No acute respiratory distress Lungs: Clear to auscultation bilaterally without wheezes or crackles Cardiovascular: Regular rate and rhythm without murmur gallop or rub normal S1 and S2 Abdomen: Nontender, nondistended, soft, bowel sounds positive, no rebound, no ascites, no appreciable mass Extremities: No significant cyanosis, clubbing, or edema bilateral lower extremities  Basic Metabolic Panel:  Recent Labs Lab 04/29/16 1740 04/29/16 2050 04/30/16 0438 04/30/16 0809 04/30/16 1438 05/01/16 0421  NA 126* 130* 135 134*  --  135  K 4.6 4.0 3.2* 3.4* 3.4* 3.0*  CL 87* 93* 103 101  --  101  CO2 17* 17* 22 20*  --  22  GLUCOSE 758* 500* 128* 221*  --  260*  BUN 10 9 <5* <5*  --  <5*  CREATININE 1.48* 1.32* 0.81 0.77  --  0.71  CALCIUM 9.1 9.1 8.3* 8.1*  --  8.7*  MG  --   --   --   --  1.4* 2.2  PHOS  --   --   --   --   --  1.6*    Liver Function Tests:  Recent Labs Lab 04/29/16 1335 04/30/16 0437 05/01/16 0421  AST 17 12* 18  ALT 17 12* 13*  ALKPHOS 73 54 52  BILITOT 1.8* 0.9 0.6  PROT 7.7 6.0* 6.2*  ALBUMIN 2.7* 2.2* 2.1*    Recent Labs Lab 04/29/16 1335 04/30/16 0437  LIPASE 40 30   CBC:  Recent Labs Lab  04/29/16 1335 04/29/16 2050 04/30/16 0437  WBC 11.6* 11.3* 10.3  NEUTROABS  --   --  6.1  HGB 12.4 12.6 10.2*  HCT 37.2 37.7 30.1*  MCV 91.6 90.8 87.0  PLT 555* 515* 438*    Cardiac Enzymes:  Recent Labs Lab 04/29/16 1740 04/29/16 2050 04/30/16 0437 04/30/16 0809  TROPONINI <0.03 0.03 <0.03 <0.03    CBG:  Recent Labs Lab 04/30/16 2031 05/01/16 0023 05/01/16 0321 05/01/16 0752 05/01/16 1145  GLUCAP 291* 390* 308* 187* 392*    Recent Results (from the past 240 hour(s))  MRSA PCR Screening     Status: Abnormal   Collection Time: 04/30/16 12:45 AM  Result Value Ref Range Status   MRSA by PCR POSITIVE (A) NEGATIVE Final    Comment:        The GeneXpert MRSA Assay (FDA approved for NASAL specimens only), is one component of a comprehensive MRSA colonization surveillance program. It is not intended to diagnose MRSA infection nor to guide or monitor treatment for MRSA infections. RESULT CALLED TO, READ BACK BY AND VERIFIED WITH: TIM IRBY,RN _0  04/30/16 MKELLY      Time spent in discharge (includes decision making & examination of pt): >35 minutes  05/01/2016, 3:35 PM   Cherene Altes, MD [facility] Office  [phone] Pager [phone]  On-Call/Text Page:      [email or web address]      password [facility]

## 2016-05-01 NOTE — Discharge Summary (Addendum)
DISCHARGE SUMMARY  Carla Hicks  MR#: [id number]  DOB:1971/02/06  Date of Admission: 04/29/2016 Date of Discharge: 05/01/2016  Attending Physician:MCCLUNG,JEFFREY T  Patient's RFX:JOITGPQ, Carla Laity, MD  Consults:  none  Disposition: d/c home at insistence of patient - pt advised this was unwise and unsafe, and educated on high risk of serious medical illness recurring, and possible leading to her death   Follow-up Appts:     Follow-up Information    Follow up with [facility] On 05/12/2016.   Why:  [facility] appointment on 05/12/16 at 11:30 am with Dr. Jarold Hicks.   Contact information:   [place] [phone]      Tests Needing Follow-up: -monitor CBGs -recheck lipids -recheck R foot wound   Discharge Diagnoses: DKA in uncontrolled DM 2 Abdom pain w/ recent Acute alcohol-induced pancreatitis Acute renal failure Alcohol abuse Alpha-1 antitrypsin deficiency Right foot diabetic ulcer Hypokalemia Hypomagnesemia HLD HTN / Chronic diastolic CHF / Pulmonary Hypertension MRSA screen +     Initial presentation: 45 y.o. F Hx EtOH abuse who drinks approximately 12 beers QD, Chronic Diastolic CHF, morbid obesity, DM2, alpha-one antitrypsin deficiency, and Dyslipidemia who presented to the ED with complaints of persistent abdominal pain after having been admitted for pancreatitis 4/27 > 5/02 . Patient reported multiple episodes of vomiting since her d/c. Patient had not been taking insulin "because she didn't have a CBG meter." In the ER patient's blood sugar was found to be in the 700s with elevated anion gap and positive beta hydroxybutyrate. Urine ketones were positive.   Hospital Course:  DKA in uncontrolled DM 2 -noncompliant with DM medication, not on DM diet -CBG remains very poorly controlled - was not written for basal insulin today - resumed levemir on prior intended BID  basis - had no intention to d/c pt home today, but she insisted - rather than forcing her to leave AMA w/o scripts, I d/c'ed her at her request, w/ my explanation that it was unsafe and not in her best interest - advised her to follow her CBG 4x a day at home and to return to the ED immediately if her CBG is consistently > 400 or < 70  Abdom pain w/ recent Acute alcohol-induced pancreatitis -Abdominal ultrasound did not reveal evidence of gallstones -lipase normal th/o this admission  -acute episode was during prior admission (4/27-5/02)  Last Labs      Recent Labs Lab 04/29/16 1335 04/30/16 0437  LIPASE 40 30     Acute renal failure Pre-renal in setting of DKA - resolved w/ volume resuscitation   Last Labs      Recent Labs Lab 04/29/16 1740 04/29/16 2050 04/30/16 0438 04/30/16 0809 05/01/16 0421  CREATININE 1.48* 1.32* 0.81 0.77 0.71      Alcohol abuse -Dr. Sherral Hammers spoke at length with patient concerning her alcoholism and its effect on her uncontrolled diabetes, and counseled patient on sequela of continuing this lifestyle to include blindness, kidney failure, neuropathy, death.  -no evidence of withdrawal whatsoever at time pt insisted on being D/C - she was alert, oriented, and calm  Alpha-1 antitrypsin deficiency -followed by Dr. Melvyn Novas - on prednisone 23m QD long term - explained absolute need to avoid EtOH and tobacco   Right foot diabetic ulcer no active discharge - close follow-up as outpatient - patient stated she had tetanus toxoid December 2016  Hypokalemia -secondary to GI losses from nausea and vomiting, poor nutritional status  due to alcoholism, and cellular shift w/ tx of hyperglycemia - cont to replace  Hypomagnesemia -replaced to normal range  HLD -lipid panel 4/28 not reliable due to high triglycerides   HTN / Chronic diastolic CHF / Pulmonary Hypertension -BP currently well controlled  MRSA screen +         Medication  List    STOP taking these medications        acetaminophen-codeine 300-30 MG tablet  Commonly known as:  TYLENOL #3     famotidine 20 MG tablet  Commonly known as:  PEPCID     glipiZIDE 5 MG tablet  Commonly known as:  GLUCOTROL     metFORMIN 500 MG tablet  Commonly known as:  GLUCOPHAGE     oxyCODONE-acetaminophen 7.5-325 MG tablet  Commonly known as:  PERCOCET      TAKE these medications        albuterol (5 MG/ML) 0.5% nebulizer solution  Commonly known as:  PROVENTIL  Take 0.5 mLs (2.5 mg total) by nebulization every 4 (four) hours as needed for wheezing or shortness of breath. Sob     atorvastatin 20 MG tablet  Commonly known as:  LIPITOR  Take 1 tablet (20 mg total) by mouth daily at 6 PM.     blood glucose meter kit and supplies Kit  Dispense based on patient and insurance preference. Use up to four times daily as directed. (FOR ICD-9 250.00, 250.01).     budesonide-formoterol 80-4.5 MCG/ACT inhaler  Commonly known as:  SYMBICORT  Take 2 puffs first thing in am and then another 2 puffs about 12 hours later.     cloNIDine 0.1 MG tablet  Commonly known as:  CATAPRES  Take 1 tablet (0.1 mg total) by mouth 2 (two) times daily.     diclofenac sodium 1 % Gel  Commonly known as:  VOLTAREN  Apply 1 application topically 4 (four) times daily.     FLUTTER Devi  Use as directed     gabapentin 300 MG capsule  Commonly known as:  NEURONTIN  Take 1 capsule (300 mg total) by mouth 2 (two) times daily.     gemfibrozil 600 MG tablet  Commonly known as:  LOPID  Take 1 tablet (600 mg total) by mouth 2 (two) times daily before a meal.     glucose blood test strip  Use as instructed     ibuprofen 200 MG tablet  Commonly known as:  ADVIL  Take 1-2 tablets (200-400 mg total) by mouth every 6 (six) hours as needed for mild pain or moderate pain.     insulin detemir 100 UNIT/ML injection  Commonly known as:  LEVEMIR  Inject 0.18 mLs (18 Units total) into the skin 2 (two)  times daily.     Insulin Pen Needle 29G X 13MM Misc  1 Device by Does not apply route 4 (four) times daily - after meals and at bedtime.     INSULIN SYRINGE 1CC/29G 29G X 1/2" 1 ML Misc  1 Syringe by Does not apply route 4 (four) times daily - after meals and at bedtime.     Lancets Misc  1 Stick by Does not apply route 4 (four) times daily -  with meals and at bedtime.     multivitamin with minerals Tabs tablet  Take 1 tablet by mouth daily.     pantoprazole 40 MG tablet  Commonly known as:  PROTONIX  Take 1 tablet (40 mg total) by mouth daily. Take  30-60 min before first meal of the day     potassium chloride SA 20 MEQ tablet  Commonly known as:  K-DUR,KLOR-CON  Take 1 tablet (20 mEq total) by mouth daily.     predniSONE 20 MG tablet  Commonly known as:  DELTASONE  Take 1 tablet (20 mg total) by mouth daily with breakfast.        Day of Discharge BP 110/68 mmHg  Pulse 83  Temp(Src) 98.6 F (37 C) (Oral)  Resp 24  Ht _0  (1.626 m)  Wt 106 kg (233 lb 11 oz)  BMI 40.09 kg/m2  SpO2 100%  LMP 03/24/2016  Physical Exam: General: No acute respiratory distress Lungs: Clear to auscultation bilaterally without wheezes or crackles Cardiovascular: Regular rate and rhythm without murmur gallop or rub normal S1 and S2 Abdomen: Nontender, nondistended, soft, bowel sounds positive, no rebound, no ascites, no appreciable mass Extremities: No significant cyanosis, clubbing, or edema bilateral lower extremities  Basic Metabolic Panel:  Recent Labs Lab 04/29/16 1740 04/29/16 2050 04/30/16 0438 04/30/16 0809 04/30/16 1438 05/01/16 0421  NA 126* 130* 135 134*  --  135  K 4.6 4.0 3.2* 3.4* 3.4* 3.0*  CL 87* 93* 103 101  --  101  CO2 17* 17* 22 20*  --  22  GLUCOSE 758* 500* 128* 221*  --  260*  BUN 10 9 <5* <5*  --  <5*  CREATININE 1.48* 1.32* 0.81 0.77  --  0.71  CALCIUM 9.1 9.1 8.3* 8.1*  --  8.7*  MG  --   --   --   --  1.4* 2.2  PHOS  --   --   --   --   --  1.6*      Liver Function Tests:  Recent Labs Lab 04/29/16 1335 04/30/16 0437 05/01/16 0421  AST 17 12* 18  ALT 17 12* 13*  ALKPHOS 73 54 52  BILITOT 1.8* 0.9 0.6  PROT 7.7 6.0* 6.2*  ALBUMIN 2.7* 2.2* 2.1*    Recent Labs Lab 04/29/16 1335 04/30/16 0437  LIPASE 40 30   CBC:  Recent Labs Lab 04/29/16 1335 04/29/16 2050 04/30/16 0437  WBC 11.6* 11.3* 10.3  NEUTROABS  --   --  6.1  HGB 12.4 12.6 10.2*  HCT 37.2 37.7 30.1*  MCV 91.6 90.8 87.0  PLT 555* 515* 438*    Cardiac Enzymes:  Recent Labs Lab 04/29/16 1740 04/29/16 2050 04/30/16 0437 04/30/16 0809  TROPONINI <0.03 0.03 <0.03 <0.03    CBG:  Recent Labs Lab 05/01/16 0023 05/01/16 0321 05/01/16 0752 05/01/16 1145 05/01/16 1536  GLUCAP 390* 308* 187* 392* 435*    Recent Results (from the past 240 hour(s))  MRSA PCR Screening     Status: Abnormal   Collection Time: 04/30/16 12:45 AM  Result Value Ref Range Status   MRSA by PCR POSITIVE (A) NEGATIVE Final    Comment:        The GeneXpert MRSA Assay (FDA approved for NASAL specimens only), is one component of a comprehensive MRSA colonization surveillance program. It is not intended to diagnose MRSA infection nor to guide or monitor treatment for MRSA infections. RESULT CALLED TO, READ BACK BY AND VERIFIED WITH: TIM IRBY,RN _1  04/30/16 MKELLY      Time spent in discharge (includes decision making & examination of pt): >35 minutes  05/01/2016, 4:32 PM   Cherene Altes, MD [facility] Office  [phone] Pager [phone]  On-Call/Text Page:  [email or web address]      password [email or web address]

## 2016-05-04 ENCOUNTER — Telehealth: Payer: Self-pay

## 2016-05-04 NOTE — Telephone Encounter (Signed)
[facility] Post-discharge Follow-Up Phone Call:  Date of Discharge:  05/01/2016 Principal Discharge Diagnosis(es):  DKA in uncontrolled DM 2, abdominal pain with recent acute alcohol induced pancreatitis, Acute renal failure, ETOH abuse, chronic diastolic CHF Post-discharge Communication: Call placed to the patient Call Completed: Yes                   With Whom:Patient Interpreter Needed: No     Please check all that apply:  X  Patient is knowledgeable of his/her condition(s) and/or treatment. ? Patient is caring for self at home.  X  Patient is receiving assist at home from family and/or caregiver. Family and/or caregiver is knowledgeable of patient's condition(s) and/or treatment. - She stated that she is caring for herself but has support from her family if needed. She said that her 3 children do not live with her; but take turns staying with her and are with her every day.  She said that her step mother also spends time with her and can provide assistance if needed. She denied any need for any additional assistance or  home health care services at this time.  ? Patient is receiving home health services. If so, name of agency.     Medication Reconciliation:  X  Medication list reviewed with patient. - The medication list was reviewed at length with the patient. She confirmed that she is not taking tylenol #3, famotidine, glipizide, metformin and pecocet. . The patient correctly stated the use, dose and frequency for each medication. She explained when she took each medication and noted if she needed to take it with food. She said that she has not needed to use the voltaren cream.  She also stated that she is taking the ibuprofen 1-2 tablets every 6 hours as needed for pain due to her pancreatitis. She said that she has a glucometer and testing supplies and is checking her blood sugar 4 times a day and said that she is keeping a log of the blood sugars. She wanted the doctor to  know that her blood sugar fasting this morning was 394 and last night it was 366. She said that overall they are running high. She stated that she is not on a sliding scale any longer. She noted that she is taking all medication as ordered and is watching the carbs in her diet and is avoiding bread and soda and she is not drinking any alcohol.  She noted that overall she is feeling " pretty good."  Instructed her regarding the importance of taking all of her medications and insulin as prescribed and she stated that she would.  X  Patient obtained all discharge medications. If not, why? - Yes   Activities of Daily Living:   X  Independent ? Needs assist (describe; ? home DME used) ? [facility] (describe, ? home DME used)   Community resources in place for patient:  X  None  ? Home Health/Home DME ? Assisted Living ? Support Group          Patient Education:  She confirmed that she received a facemask and tubing for her CPAP when she left the hospital.  She also said that she has the flutter and correctly explained how she uses it.  She reported no falls.  Discussed the importance of completing the orange card application. She said that she will schedule an appointment with the financial counselor if needed when she comes to her [facility] appointment on 05/12/16. She also  said that she understands that she needs to go to DSS to apply for food stamps and to check if she is eligible for medicaid. She did not report any problems accessing the food that she currently needs.  She confirmed her appointment with Dr Venetia Night for 05/12/16 @ 1130 and she said that she will not need transportation. Reminded her that the [facility] can provide transportation for her to her appointment if needed.         Questions/Concerns discussed: No other problems/ concerns reported at this time. She was very appreciative of the call.  In basket message sent to Dr Venetia Night notifying her of the patient's blood sugars.

## 2016-05-04 NOTE — Telephone Encounter (Signed)
Message received from Dr Venetia Night that the patient can increase her levemir to 22 units twice daily until her next appointment ands she is to adhere to a diabetic diet.  Call placed to the patient and informed her of the change in levemir until her next appointment. She was able to repeat back the correct dosage and stated that her next appointment is 05/12/16 @ 1130.  She also noted that she understands her diabetic diet and will adhere to it as instructed.

## 2016-05-07 ENCOUNTER — Emergency Department (HOSPITAL_COMMUNITY): Payer: Self-pay

## 2016-05-07 ENCOUNTER — Encounter (HOSPITAL_COMMUNITY): Payer: Self-pay

## 2016-05-07 ENCOUNTER — Emergency Department (HOSPITAL_COMMUNITY)
Admission: EM | Admit: 2016-05-07 | Discharge: 2016-05-07 | Disposition: A | Discharge status: Home / Self Care | Payer: Self-pay | Attending: Emergency Medicine | Admitting: Emergency Medicine

## 2016-05-07 DIAGNOSIS — E119 Type 2 diabetes mellitus without complications: Secondary | ICD-10-CM | POA: Insufficient documentation

## 2016-05-07 DIAGNOSIS — Z87891 Personal history of nicotine dependence: Secondary | ICD-10-CM | POA: Insufficient documentation

## 2016-05-07 DIAGNOSIS — Z880 Allergy status to penicillin: Secondary | ICD-10-CM | POA: Insufficient documentation

## 2016-05-07 DIAGNOSIS — J441 Chronic obstructive pulmonary disease with (acute) exacerbation: Secondary | ICD-10-CM | POA: Insufficient documentation

## 2016-05-07 DIAGNOSIS — R079 Chest pain, unspecified: Secondary | ICD-10-CM | POA: Insufficient documentation

## 2016-05-07 DIAGNOSIS — Z794 Long term (current) use of insulin: Secondary | ICD-10-CM | POA: Insufficient documentation

## 2016-05-07 DIAGNOSIS — K859 Acute pancreatitis without necrosis or infection, unspecified: Secondary | ICD-10-CM | POA: Insufficient documentation

## 2016-05-07 DIAGNOSIS — Z7951 Long term (current) use of inhaled steroids: Secondary | ICD-10-CM | POA: Insufficient documentation

## 2016-05-07 DIAGNOSIS — Z79899 Other long term (current) drug therapy: Secondary | ICD-10-CM | POA: Insufficient documentation

## 2016-05-07 DIAGNOSIS — R5383 Other fatigue: Secondary | ICD-10-CM | POA: Insufficient documentation

## 2016-05-07 LAB — BLOOD GAS, VENOUS

## 2016-05-07 LAB — URINALYSIS, ROUTINE W REFLEX MICROSCOPIC
Bilirubin Urine: NEGATIVE
GLUCOSE, UA: 500 mg/dL — AB
Ketones, ur: 15 mg/dL — AB
Nitrite: NEGATIVE
Protein, ur: NEGATIVE mg/dL
SPECIFIC GRAVITY, URINE: 1.013 (ref 1.005–1.030)
pH: 6 (ref 5.0–8.0)

## 2016-05-07 LAB — I-STAT VENOUS BLOOD GAS, ED
Acid-Base Excess: 7 mmol/L — ABNORMAL HIGH (ref 0.0–2.0)
Bicarbonate: 31.7 mEq/L — ABNORMAL HIGH (ref 20.0–24.0)
O2 SAT: 40 %
PCO2 VEN: 45.9 mmHg (ref 45.0–50.0)
PO2 VEN: 22 mmHg — AB (ref 31.0–45.0)
TCO2: 33 mmol/L (ref 0–100)
pH, Ven: 7.447 — ABNORMAL HIGH (ref 7.250–7.300)

## 2016-05-07 LAB — CBC WITH DIFFERENTIAL/PLATELET
BASOS ABS: 0 10*3/uL (ref 0.0–0.1)
Basophils Relative: 0 %
Eosinophils Absolute: 0.1 10*3/uL (ref 0.0–0.7)
Eosinophils Relative: 1 %
HEMATOCRIT: 34.8 % — AB (ref 36.0–46.0)
HEMOGLOBIN: 11.5 g/dL — AB (ref 12.0–15.0)
LYMPHS ABS: 2.2 10*3/uL (ref 0.7–4.0)
LYMPHS PCT: 23 %
MCH: 29 pg (ref 26.0–34.0)
MCHC: 33 g/dL (ref 30.0–36.0)
MCV: 87.9 fL (ref 78.0–100.0)
Monocytes Absolute: 0.4 10*3/uL (ref 0.1–1.0)
Monocytes Relative: 4 %
NEUTROS ABS: 6.7 10*3/uL (ref 1.7–7.7)
Neutrophils Relative %: 72 %
Platelets: 299 10*3/uL (ref 150–400)
RBC: 3.96 MIL/uL (ref 3.87–5.11)
RDW: 13 % (ref 11.5–15.5)
WBC: 9.4 10*3/uL (ref 4.0–10.5)

## 2016-05-07 LAB — BASIC METABOLIC PANEL
ANION GAP: 13 (ref 5–15)
BUN: <5 mg/dL — ABNORMAL LOW (ref 6–20)
CHLORIDE: 97 mmol/L — AB (ref 101–111)
CO2: 27 mmol/L (ref 22–32)
Calcium: 8.7 mg/dL — ABNORMAL LOW (ref 8.9–10.3)
Creatinine, Ser: 0.71 mg/dL (ref 0.44–1.00)
GFR calc Af Amer: >60 mL/min (ref >60)
GLUCOSE: 316 mg/dL — AB (ref 65–99)
POTASSIUM: 2.7 mmol/L — AB (ref 3.5–5.1)
Sodium: 137 mmol/L (ref 135–145)

## 2016-05-07 LAB — URINE MICROSCOPIC-ADD ON

## 2016-05-07 LAB — D-DIMER, QUANTITATIVE: D-Dimer, Quant: 1.03 ug/mL-FEU — ABNORMAL HIGH (ref 0.00–0.50)

## 2016-05-07 MED ORDER — POTASSIUM CHLORIDE CRYS ER 20 MEQ PO TBCR: 20.0000 meq | EXTENDED_RELEASE_TABLET | Freq: Every day | ORAL | Status: DC

## 2016-05-07 MED ORDER — IPRATROPIUM-ALBUTEROL 0.5-2.5 (3) MG/3ML IN SOLN
3.0000 mL | RESPIRATORY_TRACT | Status: AC
  Administered 2016-05-07 (×3): 3 mL via RESPIRATORY_TRACT
  Filled 2016-05-07: qty 6
  Filled 2016-05-07: qty 3

## 2016-05-07 MED ORDER — FENTANYL CITRATE (PF) 100 MCG/2ML IJ SOLN
100.0000 ug | Freq: Once | INTRAMUSCULAR | Status: AC
  Administered 2016-05-07: 100 ug via INTRAVENOUS
  Filled 2016-05-07: qty 2

## 2016-05-07 MED ORDER — PREDNISONE 20 MG PO TABS: ORAL_TABLET | ORAL | Status: DC

## 2016-05-07 MED ORDER — POTASSIUM CHLORIDE 10 MEQ/100ML IV SOLN
10.0000 meq | INTRAVENOUS | Status: DC
Start: 2016-05-07 — End: 2016-05-07
  Administered 2016-05-07: 10 meq via INTRAVENOUS
  Filled 2016-05-07: qty 100

## 2016-05-07 MED ORDER — IOPAMIDOL (ISOVUE-370) INJECTION 76%
INTRAVENOUS | Status: AC
  Administered 2016-05-07: 100 mL
  Filled 2016-05-07: qty 100

## 2016-05-07 MED ORDER — MAGNESIUM SULFATE 2 GM/50ML IV SOLN
2.0000 g | Freq: Once | INTRAVENOUS | Status: AC
  Administered 2016-05-07: 2 g via INTRAVENOUS
  Filled 2016-05-07: qty 50

## 2016-05-07 MED ORDER — ONDANSETRON 4 MG PO TBDP: 4.0000 mg | ORAL_TABLET | Freq: Three times a day (TID) | ORAL | Status: DC | PRN

## 2016-05-07 MED ORDER — OXYCODONE HCL 5 MG PO TABS: 5.0000 mg | ORAL_TABLET | ORAL | Status: DC | PRN

## 2016-05-07 MED ORDER — POTASSIUM CHLORIDE CRYS ER 20 MEQ PO TBCR
40.0000 meq | EXTENDED_RELEASE_TABLET | Freq: Once | ORAL | Status: AC
Start: 2016-05-07 — End: 2016-05-07
  Administered 2016-05-07: 40 meq via ORAL
  Filled 2016-05-07: qty 2

## 2016-05-07 NOTE — ED Notes (Signed)
Patient O2 sat down to 88% on room air, patient placed on 4L O2 via [place]

## 2016-05-07 NOTE — ED Notes (Signed)
Upon arriving back to room from xray, patient appeared very drowsy and presented with an O2 sat in the 70's. Pt is arousable to verbal stimuli, pt placed on duoneb, O2 sat 94 at this time. Dr. Adela Lank made aware and in to assess patient at this time.

## 2016-05-07 NOTE — ED Notes (Signed)
Dr. Adela Lank at bedside and advising he is going to discharge patient, MD aware patient has not had both bags of iv potassium. MD advised iv potassium can be held and he will discharge patient home with oral potassium.

## 2016-05-07 NOTE — Discharge Instructions (Signed)

## 2016-05-07 NOTE — ED Notes (Signed)
Given water per DR okay

## 2016-05-07 NOTE — ED Provider Notes (Signed)
CSN: [id number]     Arrival date & time 05/07/16  1134 History   First MD Initiated Contact with Patient 05/07/16 1142     Chief Complaint  Patient presents with  . Chest Pain  . Shortness of Breath     (Consider location/radiation/quality/duration/timing/severity/associated sxs/prior Treatment) Patient is a 45 y.o. female presenting with general illness. The history is provided by the patient.  Illness Severity:  Moderate Onset quality:  Sudden Duration:  2 days Timing:  Constant Progression:  Worsening Chronicity:  New Associated symptoms: cough, fatigue, fever, shortness of breath and wheezing   Associated symptoms: no abdominal pain, no chest pain, no congestion, no headaches, no myalgias, no nausea, no rhinorrhea and no vomiting    45 yo F With a chief complaint shortness of breath. Patient having cough congestion fevers starting a couple days ago. Associated with some left-sided back pain. Is worse with movement palpation coughing. Patient was recently hospitalized for diabetic ketoacidosis. History of COPD and continues to smoke.  Past Medical History  Diagnosis Date  . Diabetes mellitus without complication ([place])   . Asthma    Past Surgical History  Procedure Laterality Date  . Cesarean section     Family History  Problem Relation Age of Onset  . Emphysema Mother     smoked  . Allergies Mother   . Asthma Mother   . Breast cancer Mother   . Uterine cancer Mother   . Heart disease Maternal Grandmother   . Diabetes Father    Social History  Substance Use Topics  . Smoking status: Former Smoker -- 1.50 packs/day for 25 years    Types: Cigarettes    Quit date: 12/22/2011  . Smokeless tobacco: Never Used  . Alcohol Use: 0.0 oz/week    0 Standard drinks or equivalent per week   OB History    Gravida Para Term Preterm AB TAB SAB Ectopic Multiple Living   _0 Review of Systems  Constitutional: Positive for fever and fatigue. Negative for chills.   HENT: Negative for congestion and rhinorrhea.   Eyes: Negative for redness and visual disturbance.  Respiratory: Positive for cough, shortness of breath and wheezing.   Cardiovascular: Negative for chest pain and palpitations.  Gastrointestinal: Negative for nausea, vomiting, abdominal pain and abdominal distention.  Genitourinary: Negative for dysuria and urgency.  Musculoskeletal: Negative for myalgias and arthralgias.  Skin: Negative for pallor and wound.  Neurological: Negative for dizziness and headaches.      Allergies  Penicillins and Robitussin liquid center  Home Medications   Prior to Admission medications   Medication Sig Start Date End Date Taking? Authorizing Provider  albuterol (PROVENTIL) (5 MG/ML) 0.5% nebulizer solution Take 0.5 mLs (2.5 mg total) by nebulization every 4 (four) hours as needed for wheezing or shortness of breath. Sob 04/24/15   Micheline Chapman, NP  atorvastatin (LIPITOR) 20 MG tablet Take 1 tablet (20 mg total) by mouth daily at 6 PM. 04/21/16   Nita Sells, MD  blood glucose meter kit and supplies KIT Dispense based on patient and insurance preference. Use up to four times daily as directed. (FOR ICD-9 250.00, 250.01). 05/01/16   Cherene Altes, MD  budesonide-formoterol [facility]) 80-4.5 MCG/ACT inhaler Take 2 puffs first thing in am and then another 2 puffs about 12 hours later. 04/21/16   Nita Sells, MD  cloNIDine (CATAPRES) 0.1 MG tablet Take 1 tablet (0.1 mg total) by  mouth 2 (two) times daily. 04/21/16   Nita Sells, MD  diclofenac sodium (VOLTAREN) 1 % GEL Apply 1 application topically 4 (four) times daily. Patient taking differently: Apply 1 application topically 4 (four) times daily as needed (pain.).  05/30/15   Janne Napoleon, NP  gabapentin (NEURONTIN) 300 MG capsule Take 1 capsule (300 mg total) by mouth 2 (two) times daily. 09/18/15   Tiffany Daneil Marleigh Kaylor, PA-C  gemfibrozil (LOPID) 600 MG tablet Take 1 tablet (600 mg total) by  mouth 2 (two) times daily before a meal. 04/21/16   Nita Sells, MD  glucose blood test strip Use as instructed 05/01/16   Cherene Altes, MD  ibuprofen (ADVIL) 200 MG tablet Take 1-2 tablets (200-400 mg total) by mouth every 6 (six) hours as needed for mild pain or moderate pain. 05/01/16   Cherene Altes, MD  insulin detemir (LEVEMIR) 100 UNIT/ML injection Inject 0.18 mLs (18 Units total) into the skin 2 (two) times daily. 04/21/16   Nita Sells, MD  Insulin Pen Needle 29G X 13MM MISC 1 Device by Does not apply route 4 (four) times daily - after meals and at bedtime. 04/21/16   Nita Sells, MD  INSULIN SYRINGE 1CC/29G 29G X 1/2" 1 ML MISC 1 Syringe by Does not apply route 4 (four) times daily - after meals and at bedtime. 04/21/16   Nita Sells, MD  Lancets MISC 1 Stick by Does not apply route 4 (four) times daily -  with meals and at bedtime. 05/01/16   Cherene Altes, MD  Multiple Vitamin (MULTIVITAMIN WITH MINERALS) TABS tablet Take 1 tablet by mouth daily.    Historical Provider, MD  pantoprazole (PROTONIX) 40 MG tablet Take 1 tablet (40 mg total) by mouth daily. Take 30-60 min before first meal of the day 09/18/15   Brayton Caves, PA-C  potassium chloride SA (K-DUR,KLOR-CON) 20 MEQ tablet Take 1 tablet (20 mEq total) by mouth daily. 05/09/15   Micheline Chapman, NP  predniSONE (DELTASONE) 20 MG tablet Take 1 tablet (20 mg total) by mouth daily with breakfast. 04/21/16   Nita Sells, MD  Respiratory Therapy Supplies (FLUTTER) DEVI Use as directed 12/27/15   Tanda Rockers, MD   SpO2 96%  LMP 03/24/2016 Physical Exam  Constitutional: She is oriented to person, place, and time. She appears well-developed and well-nourished. No distress.  HENT:  Head: Normocephalic and atraumatic.  Eyes: EOM are normal. Pupils are equal, round, and reactive to light.  Neck: Normal range of motion. Neck supple.  Cardiovascular: Normal rate and regular rhythm.  Exam reveals no  gallop and no friction rub.   No murmur heard. Pulmonary/Chest: She is in respiratory distress. She has wheezes (diffuse expiratory with diminished breath sounds). She has no rales.  Abdominal: Soft. She exhibits no distension. There is no tenderness. There is no rebound and no guarding.  Musculoskeletal: She exhibits tenderness (left posterior ribs). She exhibits no edema.  Neurological: She is alert and oriented to person, place, and time.  Skin: Skin is warm and dry. She is not diaphoretic.  Psychiatric: She has a normal mood and affect. Her behavior is normal.  Nursing note and vitals reviewed.   ED Course  Procedures (including critical care time) Labs Review Labs Reviewed - No data to display  Imaging Review No results found. I have personally reviewed and evaluated these images and lab results as part of my medical decision-making.   EKG Interpretation None      MDM  Final diagnoses:  None    Discussed smoking cessation with patient and was they were offerred resources to help stop.  Total time was 5 min CPT code 51025.   45 yo F with a chief complaint of shortness breath. This started over the past couple days associated with cough congestion and fevers. Patient has diffuse wheezes on exam. Will treat with DuoNeb's magnesium obtain a chest x-ray.   Hypokalemia Possibly related to breathing treatments. Will replenish. D-dimer elevated we'll obtain a CT angiogram of the chest.  CT of the chest was negative for PE however incidentally found pancreatitis. Patient having some epigastric tenderness on repeat exam. She did not discuss this with me on my initial history. I will not obtain LFTs or lipase at this time as her pain appears to be well controlled and she is able to tolerate by mouth. No RUQ tenderness, negative murphys. I will discharge her with pain medicine have her follow-up with her family doctor.  2:44 PM:  I have discussed the diagnosis/risks/treatment options  with the patient and believe the pt to be eligible for discharge home to follow-up with PCP. We also discussed returning to the ED immediately if new or worsening sx occur. We discussed the sx which are most concerning (e.g., sudden worsening pain, fever, inability to tolerate by mouth) that necessitate immediate return. Medications administered to the patient during their visit and any new prescriptions provided to the patient are listed below.  Medications given during this visit Medications  ipratropium-albuterol (DUONEB) 0.5-2.5 (3) MG/3ML nebulizer solution 3 mL (3 mLs Nebulization Given 05/07/16 1246)  magnesium sulfate IVPB 2 g 50 mL (0 g Intravenous Stopped 05/07/16 1457)  fentaNYL (SUBLIMAZE) injection 100 mcg (100 mcg Intravenous Given 05/07/16 1319)  potassium chloride SA (K-DUR,KLOR-CON) CR tablet 40 mEq (40 mEq Oral Given 05/07/16 1440)  iopamidol (ISOVUE-370) 76 % injection (100 mLs  Contrast Given 05/07/16 1433)    Discharge Medication List as of 05/07/2016  3:03 PM    START taking these medications   Details  ondansetron (ZOFRAN ODT) 4 MG disintegrating tablet Take 1 tablet (4 mg total) by mouth every 8 (eight) hours as needed for nausea or vomiting., Starting 05/07/2016, Until Discontinued, Print    oxyCODONE (ROXICODONE) 5 MG immediate release tablet Take 1 tablet (5 mg total) by mouth every 4 (four) hours as needed for severe pain., Starting 05/07/2016, Until Discontinued, Print        The patient appears reasonably screen and/or stabilized for discharge and I doubt any other medical condition or other [facility] requiring further screening, evaluation, or treatment in the ED at this time prior to discharge.     Deno Etienne, DO 05/08/16 1444

## 2016-05-07 NOTE — ED Notes (Signed)
Per EMS - pt from home. Called EMS for difficulty breathing. Pt in obvious resp distress, c/o shortness of breath and center cp radiating to back. New onset today, starting a few hours ago and worse w/ deep breathing. Wheezing upon auscultation. Recently dx for DKA. Hx diabetes, cbg 277.  2 duonebs (10 albuterol, 1 atrovent total). 125mg  solumedrol IV.

## 2016-05-07 NOTE — ED Notes (Signed)
Patient transported to X-ray 

## 2016-05-09 ENCOUNTER — Emergency Department (HOSPITAL_COMMUNITY): Payer: Self-pay

## 2016-05-09 ENCOUNTER — Inpatient Hospital Stay (HOSPITAL_COMMUNITY)
Admission: EM | Admit: 2016-05-09 | Discharge: 2016-05-14 | DRG: 872 | Disposition: A | Discharge status: Home / Self Care | Payer: Self-pay | Attending: Internal Medicine | Admitting: Internal Medicine

## 2016-05-09 ENCOUNTER — Encounter (HOSPITAL_COMMUNITY): Payer: Self-pay

## 2016-05-09 DIAGNOSIS — I110 Hypertensive heart disease with heart failure: Secondary | ICD-10-CM | POA: Diagnosis present

## 2016-05-09 DIAGNOSIS — E8801 Alpha-1-antitrypsin deficiency: Secondary | ICD-10-CM

## 2016-05-09 DIAGNOSIS — Z91199 Patient's noncompliance with other medical treatment and regimen due to unspecified reason: Secondary | ICD-10-CM

## 2016-05-09 DIAGNOSIS — I10 Essential (primary) hypertension: Secondary | ICD-10-CM | POA: Diagnosis present

## 2016-05-09 DIAGNOSIS — J45901 Unspecified asthma with (acute) exacerbation: Secondary | ICD-10-CM | POA: Insufficient documentation

## 2016-05-09 DIAGNOSIS — A419 Sepsis, unspecified organism: Principal | ICD-10-CM | POA: Diagnosis present

## 2016-05-09 DIAGNOSIS — I959 Hypotension, unspecified: Secondary | ICD-10-CM | POA: Diagnosis present

## 2016-05-09 DIAGNOSIS — J4520 Mild intermittent asthma, uncomplicated: Secondary | ICD-10-CM

## 2016-05-09 DIAGNOSIS — J45991 Cough variant asthma: Secondary | ICD-10-CM

## 2016-05-09 DIAGNOSIS — Z9119 Patient's noncompliance with other medical treatment and regimen: Secondary | ICD-10-CM

## 2016-05-09 DIAGNOSIS — J9601 Acute respiratory failure with hypoxia: Secondary | ICD-10-CM

## 2016-05-09 DIAGNOSIS — I4581 Long QT syndrome: Secondary | ICD-10-CM | POA: Diagnosis present

## 2016-05-09 DIAGNOSIS — E876 Hypokalemia: Secondary | ICD-10-CM | POA: Diagnosis present

## 2016-05-09 DIAGNOSIS — Z7952 Long term (current) use of systemic steroids: Secondary | ICD-10-CM

## 2016-05-09 DIAGNOSIS — K860 Alcohol-induced chronic pancreatitis: Secondary | ICD-10-CM | POA: Diagnosis present

## 2016-05-09 DIAGNOSIS — L0390 Cellulitis, unspecified: Secondary | ICD-10-CM | POA: Diagnosis present

## 2016-05-09 DIAGNOSIS — F1011 Alcohol abuse, in remission: Secondary | ICD-10-CM | POA: Diagnosis present

## 2016-05-09 DIAGNOSIS — E1140 Type 2 diabetes mellitus with diabetic neuropathy, unspecified: Secondary | ICD-10-CM | POA: Diagnosis present

## 2016-05-09 DIAGNOSIS — I272 Other secondary pulmonary hypertension: Secondary | ICD-10-CM | POA: Diagnosis present

## 2016-05-09 DIAGNOSIS — IMO0002 Reserved for concepts with insufficient information to code with codable children: Secondary | ICD-10-CM

## 2016-05-09 DIAGNOSIS — Z794 Long term (current) use of insulin: Secondary | ICD-10-CM

## 2016-05-09 DIAGNOSIS — R6520 Severe sepsis without septic shock: Secondary | ICD-10-CM | POA: Insufficient documentation

## 2016-05-09 DIAGNOSIS — I5032 Chronic diastolic (congestive) heart failure: Secondary | ICD-10-CM | POA: Diagnosis present

## 2016-05-09 DIAGNOSIS — J45909 Unspecified asthma, uncomplicated: Secondary | ICD-10-CM | POA: Diagnosis present

## 2016-05-09 DIAGNOSIS — N179 Acute kidney failure, unspecified: Secondary | ICD-10-CM | POA: Diagnosis present

## 2016-05-09 DIAGNOSIS — G4733 Obstructive sleep apnea (adult) (pediatric): Secondary | ICD-10-CM | POA: Diagnosis present

## 2016-05-09 DIAGNOSIS — Z888 Allergy status to other drugs, medicaments and biological substances status: Secondary | ICD-10-CM

## 2016-05-09 DIAGNOSIS — J449 Chronic obstructive pulmonary disease, unspecified: Secondary | ICD-10-CM | POA: Diagnosis present

## 2016-05-09 DIAGNOSIS — J181 Lobar pneumonia, unspecified organism: Secondary | ICD-10-CM

## 2016-05-09 DIAGNOSIS — E1165 Type 2 diabetes mellitus with hyperglycemia: Secondary | ICD-10-CM

## 2016-05-09 DIAGNOSIS — E785 Hyperlipidemia, unspecified: Secondary | ICD-10-CM | POA: Diagnosis present

## 2016-05-09 DIAGNOSIS — E118 Type 2 diabetes mellitus with unspecified complications: Secondary | ICD-10-CM

## 2016-05-09 DIAGNOSIS — R062 Wheezing: Secondary | ICD-10-CM

## 2016-05-09 DIAGNOSIS — Z7951 Long term (current) use of inhaled steroids: Secondary | ICD-10-CM

## 2016-05-09 DIAGNOSIS — F17200 Nicotine dependence, unspecified, uncomplicated: Secondary | ICD-10-CM | POA: Diagnosis present

## 2016-05-09 DIAGNOSIS — L03115 Cellulitis of right lower limb: Secondary | ICD-10-CM | POA: Insufficient documentation

## 2016-05-09 DIAGNOSIS — E119 Type 2 diabetes mellitus without complications: Secondary | ICD-10-CM

## 2016-05-09 DIAGNOSIS — E1110 Type 2 diabetes mellitus with ketoacidosis without coma: Secondary | ICD-10-CM

## 2016-05-09 DIAGNOSIS — Z6841 Body Mass Index (BMI) 40.0 and over, adult: Secondary | ICD-10-CM

## 2016-05-09 DIAGNOSIS — Z880 Allergy status to penicillin: Secondary | ICD-10-CM

## 2016-05-09 DIAGNOSIS — Z79899 Other long term (current) drug therapy: Secondary | ICD-10-CM

## 2016-05-09 DIAGNOSIS — J189 Pneumonia, unspecified organism: Secondary | ICD-10-CM

## 2016-05-09 DIAGNOSIS — F1021 Alcohol dependence, in remission: Secondary | ICD-10-CM | POA: Diagnosis present

## 2016-05-09 DIAGNOSIS — E1169 Type 2 diabetes mellitus with other specified complication: Secondary | ICD-10-CM

## 2016-05-09 DIAGNOSIS — R609 Edema, unspecified: Secondary | ICD-10-CM

## 2016-05-09 LAB — COMPREHENSIVE METABOLIC PANEL
ALBUMIN: 2.4 g/dL — AB (ref 3.5–5.0)
ALK PHOS: 59 U/L (ref 38–126)
ALT: 13 U/L — AB (ref 14–54)
AST: 27 U/L (ref 15–41)
Anion gap: 13 (ref 5–15)
BUN: 6 mg/dL (ref 6–20)
CALCIUM: 8.3 mg/dL — AB (ref 8.9–10.3)
CHLORIDE: 91 mmol/L — AB (ref 101–111)
CO2: 27 mmol/L (ref 22–32)
CREATININE: 1.03 mg/dL — AB (ref 0.44–1.00)
GFR calc non Af Amer: >60 mL/min (ref >60)
GLUCOSE: 119 mg/dL — AB (ref 65–99)
Potassium: 3.3 mmol/L — ABNORMAL LOW (ref 3.5–5.1)
SODIUM: 131 mmol/L — AB (ref 135–145)
Total Bilirubin: 1.1 mg/dL (ref 0.3–1.2)
Total Protein: 7.4 g/dL (ref 6.5–8.1)

## 2016-05-09 LAB — I-STAT TROPONIN, ED: Troponin i, poc: 0 ng/mL (ref 0.00–0.08)

## 2016-05-09 LAB — CBC WITH DIFFERENTIAL/PLATELET
BASOS ABS: 0.1 10*3/uL (ref 0.0–0.1)
Basophils Relative: 1 %
EOS PCT: 0 %
Eosinophils Absolute: 0 10*3/uL (ref 0.0–0.7)
HCT: 39.6 % (ref 36.0–46.0)
HEMOGLOBIN: 13 g/dL (ref 12.0–15.0)
Lymphocytes Relative: 26 %
Lymphs Abs: 3.5 10*3/uL (ref 0.7–4.0)
MCH: 29.3 pg (ref 26.0–34.0)
MCHC: 32.8 g/dL (ref 30.0–36.0)
MCV: 89.4 fL (ref 78.0–100.0)
MONO ABS: 1.1 10*3/uL — AB (ref 0.1–1.0)
MONOS PCT: 8 %
NEUTROS PCT: 65 %
Neutro Abs: 8.9 10*3/uL — ABNORMAL HIGH (ref 1.7–7.7)
PLATELETS: 295 10*3/uL (ref 150–400)
RBC: 4.43 MIL/uL (ref 3.87–5.11)
RDW: 13.5 % (ref 11.5–15.5)
WBC: 13.6 10*3/uL — AB (ref 4.0–10.5)

## 2016-05-09 LAB — CBG MONITORING, ED: Glucose-Capillary: 115 mg/dL — ABNORMAL HIGH (ref 65–99)

## 2016-05-09 LAB — LIPASE, BLOOD: Lipase: 13 U/L (ref 11–51)

## 2016-05-09 LAB — I-STAT CG4 LACTIC ACID, ED: Lactic Acid, Venous: 2.4 mmol/L (ref 0.5–2.0)

## 2016-05-09 MED ORDER — MORPHINE SULFATE (PF) 2 MG/ML IV SOLN
2.0000 mg | INTRAVENOUS | Status: DC | PRN
  Administered 2016-05-09 – 2016-05-13 (×10): 2 mg via INTRAVENOUS
  Administered 2016-05-14 (×2): 4 mg via INTRAVENOUS
  Filled 2016-05-09 (×2): qty 1
  Filled 2016-05-09: qty 2
  Filled 2016-05-09 (×4): qty 1
  Filled 2016-05-09: qty 2
  Filled 2016-05-09 (×5): qty 1

## 2016-05-09 MED ORDER — DEXTROSE 5 % IV SOLN
2.0000 g | Freq: Once | INTRAVENOUS | Status: AC
  Administered 2016-05-09: 2 g via INTRAVENOUS
  Filled 2016-05-09: qty 2

## 2016-05-09 MED ORDER — VANCOMYCIN HCL IN DEXTROSE 750-5 MG/150ML-% IV SOLN
750.0000 mg | Freq: Two times a day (BID) | INTRAVENOUS | Status: DC
  Administered 2016-05-10 – 2016-05-11 (×3): 750 mg via INTRAVENOUS
  Filled 2016-05-09 (×5): qty 150

## 2016-05-09 MED ORDER — VANCOMYCIN HCL 10 G IV SOLR
2000.0000 mg | Freq: Once | INTRAVENOUS | Status: AC
  Administered 2016-05-09: 2000 mg via INTRAVENOUS
  Filled 2016-05-09: qty 2000

## 2016-05-09 MED ORDER — ACETAMINOPHEN 325 MG PO TABS
650.0000 mg | ORAL_TABLET | Freq: Once | ORAL | Status: AC | PRN
  Administered 2016-05-09: 650 mg via ORAL
  Filled 2016-05-09: qty 2

## 2016-05-09 MED ORDER — ONDANSETRON HCL 4 MG/2ML IJ SOLN
4.0000 mg | Freq: Once | INTRAMUSCULAR | Status: AC
  Administered 2016-05-09: 4 mg via INTRAVENOUS
  Filled 2016-05-09: qty 2

## 2016-05-09 MED ORDER — SODIUM CHLORIDE 0.9 % IV BOLUS (SEPSIS)
1000.0000 mL | Freq: Once | INTRAVENOUS | Status: AC
Start: 2016-05-09 — End: 2016-05-09
  Administered 2016-05-09: 1000 mL via INTRAVENOUS

## 2016-05-09 MED ORDER — MORPHINE SULFATE (PF) 4 MG/ML IV SOLN
4.0000 mg | Freq: Once | INTRAVENOUS | Status: AC
  Administered 2016-05-09: 4 mg via INTRAVENOUS
  Filled 2016-05-09: qty 1

## 2016-05-09 MED ORDER — DEXTROSE 5 % IV SOLN
1.0000 g | Freq: Three times a day (TID) | INTRAVENOUS | Status: DC
  Administered 2016-05-10: 1 g via INTRAVENOUS
  Filled 2016-05-09: qty 1

## 2016-05-09 MED ORDER — SODIUM CHLORIDE 0.9 % IV BOLUS (SEPSIS)
1000.0000 mL | Freq: Once | INTRAVENOUS | Status: AC
  Administered 2016-05-09: 1000 mL via INTRAVENOUS

## 2016-05-09 NOTE — ED Notes (Signed)
[facility] contacted to page Code Sepsis 

## 2016-05-09 NOTE — ED Notes (Signed)
Called Dr. Silverio Lay to report temperature and lactic of 2.4. MD ackonwledges, orders code sepsis.

## 2016-05-09 NOTE — ED Notes (Signed)
Patient requests more pain medication. Reported to Dr. Silverio Lay. MD allows repeat morphine, 4mg . Dr. Adela Glimpse currently at the bedside, med held for bp

## 2016-05-09 NOTE — ED Notes (Signed)
Called Dr. Silverio Lay for pain medication. MD acknowledges, gives verbal order for 4mg  of morphine and 4mg  of zofran IV

## 2016-05-09 NOTE — Progress Notes (Signed)
Pharmacy Antibiotic Note  Carla Hicks is a 45 y.o. female admitted on 05/09/2016 with cellulitis.  Pharmacy has been consulted for vancomycin and cefepime dosing. Tmax is 102.5 and WBC is elevated at 13.6. SCr has bumped slightly from a couple of days ago.   Plan: - Vanc  IV x 1 then  IV Q12H - Cefepime 2gm IV x 1 then 1gm IV Q8H - F/u renal fxn, C&S, clinical status and trough at SS  Weight: 233 lb 11 oz (106 kg)  Temp (24hrs), Avg:102.5 F (39.2 C), Min:102.5 F (39.2 C), Max:102.5 F (39.2 C)   Recent Labs Lab 05/07/16 1205 05/09/16 2045 05/09/16 2053  WBC 9.4 13.6*  --   CREATININE 0.71 1.03*  --   LATICACIDVEN  --   --  2.40*    Estimated Creatinine Clearance: 82.7 mL/min (by C-G formula based on Cr of 1.03).    Allergies  Allergen Reactions  . Penicillins Hives    Has patient had a PCN reaction causing immediate rash, facial/tongue/throat swelling, SOB or lightheadedness with hypotension: yes Has patient had a PCN reaction causing severe rash involving mucus membranes or skin necrosis: no Has patient had a PCN reaction that required hospitalization no Has patient had a PCN reaction occurring within the last 10 years: no If all of the above answers are "NO", then may proceed with Cephalosporin use.   . Robitussin Liquid Center [Menthol] Hives    Antimicrobials this admission: Vanc 5/20>> Cefepime 5/20>>  Dose adjustments this admission: N/A  Microbiology results: Pending  Thank you for allowing pharmacy to be a part of this patient's care.  Abelardo Seidner, Drake Leach 05/09/2016 10:09 PM

## 2016-05-09 NOTE — H&P (Addendum)
Carla Hicks CMK:[id number] DOB: [birth year] DOA: 05/09/2016   PCP: Minerva Ends, MD   Outpatient Specialists: [facility]   Patient coming from: home  Chief Complaint: leg pain and swelling  HPI: Carla Hicks is a 45 y.o. female with medical history significant for asthma/COPD uncontrolled diabetes type 2,history of alcohol abuse, alcohol induced pancreatitis, diet hepatic ulcer in her right foot, hypertension, chronic diastolic heart failure, pulmonary hypertension, positive MRSA screen, morbid obesity with dyslipidemia    Presented with several weeks of right lower extremity swelling and ulceration now the erythema has spread and increased up to a calf. Over the past 2 days the pain has increased.  22 days ago patient was evaluated at emergency department for respiratory distress. CT angiogram of the chest was negative for PE,. She was thought to have COPD exacerbation and started on steroid taper.  She incidentally found to have pancreatitis on the CT. Her pain appeared to be well controlled and she was able to tolerate by mouth she was discharged home with follow-up at primary care provider. He continues to have some shortness of breath  States did not improve with albuterol. Patient have had chills and reports her cough has been getting worse Patient reports that her diabetes has been under poor control, endorses subjective fevers and chills  Regarding pertinent Chronic problems: patient has history of alcohol abuse drinks about 12 beers a day. She have had recurrent admissions for pancreatitisthought to be alcohol induced. Has  been compliant with insulin use for her diabetes since discharge reports does not have a CABG meter. Last week she was admitted for DKA   IN VW:PVXYIA up to 102.5 heart rate up to 101 respirations up to 22 with pressure down to 95/61 White blood cell count increased from prior to 13.6sodium down to 131 potassium 3.3 cr up from 0.7 to  1.03 lactic acid 2.4 Plain imaging of right tibia showed soft tissue edema but no osteo chest x-ray showing increase in atelectasis  Patient was started on cefepime and think per pharmacy she was given normal serum bolus 1 L 2 and 4 mg of morphine   Hospitalist was called for admission for sepsis due to cellulitis  Review of Systems:    Pertinent positives include: Fevers, chills, fatigue, right leg pain Constitutional:  No weight loss, night sweats, weight loss  HEENT:  No headaches, Difficulty swallowing,Tooth/dental problems,Sore throat,  No sneezing, itching, ear ache, nasal congestion, post nasal drip,  Cardio-vascular:  No chest pain, Orthopnea, PND, anasarca, dizziness, palpitations.no Bilateral lower extremity swelling  GI:  No heartburn, indigestion, abdominal pain, nausea, vomiting, diarrhea, change in bowel habits, loss of appetite, melena, blood in stool, hematemesis Resp:  no shortness of breath at rest. No dyspnea on exertion, No excess mucus, no productive cough, No non-productive cough, No coughing up of blood.No change in color of mucus.No wheezing. Skin:  no rash or lesions. No jaundice GU:  no dysuria, change in color of urine, no urgency or frequency. No straining to urinate.  No flank pain.  Musculoskeletal:  No joint pain or no joint swelling. No decreased range of motion. No back pain.  Psych:  No change in mood or affect. No depression or anxiety. No memory loss.  Neuro: no localizing neurological complaints, no tingling, no weakness, no double vision, no gait abnormality, no slurred speech, no confusion  As per HPI otherwise 10 point review of systems negative.   Past Medical History: Past Medical History  Diagnosis Date  . Diabetes mellitus without complication ([place])   . Asthma    Past Surgical History  Procedure Laterality Date  . Cesarean section       Social History:  Ambulatory   independently   Lives at home   With family      reports that she quit smoking about 4 years ago. Her smoking use included Cigarettes. She has a 37.5 pack-year smoking history. She has never used smokeless tobacco. She reports that she drinks alcohol. She reports that she does not use illicit drugs.  Allergies:   Allergies  Allergen Reactions  . Penicillins Hives    Has patient had a PCN reaction causing immediate rash, facial/tongue/throat swelling, SOB or lightheadedness with hypotension: yes Has patient had a PCN reaction causing severe rash involving mucus membranes or skin necrosis: no Has patient had a PCN reaction that required hospitalization no Has patient had a PCN reaction occurring within the last 10 years: no If all of the above answers are "NO", then may proceed with Cephalosporin use.   . [place] [Menthol] Hives       Family History:    Family History  Problem Relation Age of Onset  . Emphysema Mother     smoked  . Allergies Mother   . Asthma Mother   . Breast cancer Mother   . Uterine cancer Mother   . Heart disease Maternal Grandmother   . Diabetes Father     Medications: Prior to Admission medications   Medication Sig Start Date End Date Taking? Authorizing Provider  albuterol (PROVENTIL HFA;VENTOLIN HFA) 108 (90 Base) MCG/ACT inhaler Inhale 2 puffs into the lungs every 4 (four) hours as needed for wheezing or shortness of breath.   Yes Historical Provider, MD  albuterol (PROVENTIL) (5 MG/ML) 0.5% nebulizer solution Take 0.5 mLs (2.5 mg total) by nebulization every 4 (four) hours as needed for wheezing or shortness of breath. Sob Patient taking differently: Take 2.5 mg by nebulization every 4 (four) hours. Sob 04/24/15  Yes Micheline Chapman, NP  atorvastatin (LIPITOR) 20 MG tablet Take 1 tablet (20 mg total) by mouth daily at 6 PM. 04/21/16  Yes Nita Sells, MD  blood glucose meter kit and supplies KIT Dispense based on patient and insurance preference. Use up to four times daily as  directed. (FOR ICD-9 250.00, 250.01). 05/01/16  Yes Cherene Altes, MD  budesonide (PULMICORT) 0.5 MG/2ML nebulizer solution Take 0.5 mg by nebulization every 4 (four) hours.   Yes Historical Provider, MD  budesonide-formoterol (SYMBICORT) 80-4.5 MCG/ACT inhaler Take 2 puffs first thing in am and then another 2 puffs about 12 hours later. 04/21/16  Yes Nita Sells, MD  cloNIDine (CATAPRES) 0.1 MG tablet Take 1 tablet (0.1 mg total) by mouth 2 (two) times daily. 04/21/16  Yes Nita Sells, MD  diclofenac sodium (VOLTAREN) 1 % GEL Apply 1 application topically 4 (four) times daily. Patient taking differently: Apply 1 application topically 4 (four) times daily as needed (pain.).  05/30/15  Yes Janne Napoleon, NP  gabapentin (NEURONTIN) 300 MG capsule Take 1 capsule (300 mg total) by mouth 2 (two) times daily. 09/18/15  Yes Tiffany Daneil Dan, PA-C  gemfibrozil (LOPID) 600 MG tablet Take 1 tablet (600 mg total) by mouth 2 (two) times daily before a meal. 04/21/16  Yes Nita Sells, MD  glucose blood test strip Use as instructed 05/01/16  Yes Cherene Altes, MD  ibuprofen (ADVIL) 200 MG tablet Take 1-2 tablets (200-400 mg  total) by mouth every 6 (six) hours as needed for mild pain or moderate pain. 05/01/16  Yes Cherene Altes, MD  insulin detemir (LEVEMIR) 100 UNIT/ML injection Inject 0.18 mLs (18 Units total) into the skin 2 (two) times daily. Patient taking differently: Inject 22 Units into the skin 2 (two) times daily.  04/21/16  Yes Nita Sells, MD  Insulin Pen Needle 29G X 13MM MISC 1 Device by Does not apply route 4 (four) times daily - after meals and at bedtime. 04/21/16  Yes Nita Sells, MD  INSULIN SYRINGE 1CC/29G 29G X 1/2" 1 ML MISC 1 Syringe by Does not apply route 4 (four) times daily - after meals and at bedtime. 04/21/16  Yes Nita Sells, MD  Lancets MISC 1 Stick by Does not apply route 4 (four) times daily -  with meals and at bedtime. 05/01/16  Yes Cherene Altes, MD  Multiple Vitamin (MULTIVITAMIN WITH MINERALS) TABS tablet Take 1 tablet by mouth daily.   Yes Historical Provider, MD  oxyCODONE (ROXICODONE) 5 MG immediate release tablet Take 1 tablet (5 mg total) by mouth every 4 (four) hours as needed for severe pain. 05/07/16  Yes Deno Etienne, DO  pantoprazole (PROTONIX) 40 MG tablet Take 1 tablet (40 mg total) by mouth daily. Take 30-60 min before first meal of the day 09/18/15  Yes Tiffany S Noel, PA-C  potassium chloride SA (K-DUR,KLOR-CON) 20 MEQ tablet Take 1 tablet (20 mEq total) by mouth daily. 05/07/16  Yes Deno Etienne, DO  predniSONE (DELTASONE) 20 MG tablet 2 tabs po daily x 4 days Patient taking differently: Take 40 mg by mouth daily with breakfast. 2 tabs po daily x 4 days 05/07/16  Yes Deno Etienne, DO  Respiratory Therapy Supplies (FLUTTER) DEVI Use as directed 12/27/15  Yes Tanda Rockers, MD    Physical Exam: Patient Vitals for the past 24 hrs:  BP Temp Temp src Pulse Resp SpO2 Weight  05/09/16 2225 95/61 mmHg 100.6 F (38.1 C) Oral 100 16 94 % -  05/09/16 2225 95/61 mmHg - - 99 14 96 % -  05/09/16 2122 - - - - - - 106 kg (233 lb 11 oz)  05/09/16 2115 97/59 mmHg - - 96 22 95 % -  05/09/16 2101 - 102.5 F (39.2 C) Oral - - - -  05/09/16 2030 110/72 mmHg - - 101 - 93 % -    1. General:  in No Acute distress 2. Psychological: Alert and   Oriented 3. Head/ENT:   Moist   Mucous Membranes                          Head Non traumatic, neck supple                          Normal   Dentition 4. SKIN:   decreased Skin turgor,  Skin clean Dry right lower extremity erythematous    5. Heart: Regular rate and rhythm no  Murmur, Rub or gallop 6. Lungs:  no wheezes or crackles   7. Abdomen: Soft, non-tender, Non distended 8. Lower extremities: no clubbing, cyanosis, or edema 9. Neurologically Grossly intact, moving all 4 extremities equally 10. MSK: Normal range of motion   body mass index is 40.09 kg/(m^2).  Labs on Admission:   Labs  on Admission: I have personally reviewed following labs and imaging studies  CBC:  Recent Labs Lab 05/07/16  1205 05/09/16 2045  WBC 9.4 13.6*  NEUTROABS 6.7 8.9*  HGB 11.5* 13.0  HCT 34.8* 39.6  MCV 87.9 89.4  PLT 299 481   Basic Metabolic Panel:  Recent Labs Lab 05/07/16 1205 05/09/16 2045  NA 137 131*  K 2.7* 3.3*  CL 97* 91*  CO2 27 27  GLUCOSE 316* 119*  BUN <5* 6  CREATININE 0.71 1.03*  CALCIUM 8.7* 8.3*   GFR: Estimated Creatinine Clearance: 82.7 mL/min (by C-G formula based on Cr of 1.03). Liver Function Tests:  Recent Labs Lab 05/09/16 2045  AST 27  ALT 13*  ALKPHOS 59  BILITOT 1.1  PROT 7.4  ALBUMIN 2.4*    Recent Labs Lab 05/09/16 2045  LIPASE 13   No results for input(s): AMMONIA in the last 168 hours. Coagulation Profile: No results for input(s): INR, PROTIME in the last 168 hours. Cardiac Enzymes: No results for input(s): CKTOTAL, CKMB, CKMBINDEX, TROPONINI in the last 168 hours. BNP (last 3 results) No results for input(s): PROBNP in the last 8760 hours. HbA1C: No results for input(s): HGBA1C in the last 72 hours. CBG:  Recent Labs Lab 05/09/16 2126  GLUCAP 115*   Lipid Profile: No results for input(s): CHOL, HDL, LDLCALC, TRIG, CHOLHDL, LDLDIRECT in the last 72 hours. Thyroid Function Tests: No results for input(s): TSH, T4TOTAL, FREET4, T3FREE, THYROIDAB in the last 72 hours. Anemia Panel: No results for input(s): VITAMINB12, FOLATE, FERRITIN, TIBC, IRON, RETICCTPCT in the last 72 hours. Urine analysis:    Component Value Date/Time   COLORURINE YELLOW 05/07/2016 1340   APPEARANCEUR CLOUDY* 05/07/2016 1340   LABSPEC 1.013 05/07/2016 1340   PHURINE 6.0 05/07/2016 1340   GLUCOSEU 500* 05/07/2016 1340   HGBUR TRACE* 05/07/2016 1340   BILIRUBINUR NEGATIVE 05/07/2016 1340   BILIRUBINUR neg 09/18/2015 1131   KETONESUR 15* 05/07/2016 1340   PROTEINUR NEGATIVE 05/07/2016 1340   PROTEINUR 30 09/18/2015 1131   UROBILINOGEN 0.2  09/18/2015 1131   UROBILINOGEN 0.2 12/11/2014 1522   NITRITE NEGATIVE 05/07/2016 1340   NITRITE neg 09/18/2015 1131   LEUKOCYTESUR MODERATE* 05/07/2016 1340   Sepsis Labs: @LABRCNTIP (procalcitonin:4,lacticidven:4) ) Recent Results (from the past 240 hour(s))  MRSA PCR Screening     Status: Abnormal   Collection Time: 04/30/16 12:45 AM  Result Value Ref Range Status   MRSA by PCR POSITIVE (A) NEGATIVE Final    Comment:        The GeneXpert MRSA Assay (FDA approved for NASAL specimens only), is one component of a comprehensive MRSA colonization surveillance program. It is not intended to diagnose MRSA infection nor to guide or monitor treatment for MRSA infections. RESULT CALLED TO, READ BACK BY AND VERIFIED WITH: TIM IRBY,RN @0510  04/30/16 MKELLY       UA ordered  Lab Results  Component Value Date   HGBA1C 9.7* 04/29/2016    Estimated Creatinine Clearance: 82.7 mL/min (by C-G formula based on Cr of 1.03).  BNP (last 3 results) No results for input(s): PROBNP in the last 8760 hours.   ECG REPORT  Independently reviewed Rate: 111  Rhythm: sinus tachycardia ST&T Change: flat t waves QTC  527  Filed Weights   05/09/16 2122  Weight: 106 kg (233 lb 11 oz)     Cultures:    Component Value Date/Time   SDES URINE, CLEAN CATCH 12/16/2015 1648   SPECREQUEST NONE 12/16/2015 1648   CULT  12/16/2015 1321    NO GROWTH 5 DAYS Performed at [facility]  12/18/2015 FINAL 12/16/2015 1648     Radiological Exams on Admission: Dg Chest 2 View  05/09/2016  CLINICAL DATA:  Pain. EXAM: CHEST  2 VIEW COMPARISON:  May 07, 2016 FINDINGS: Mild bibasilar atelectasis. Linear opacity in the lingula is probably atelectatic as well. No pneumothorax. No other interval changes or acute abnormalities. A rounded density in the left hilum is consistent with a vessel on end. No nodule was seen in this location on the recent CT scan. IMPRESSION: Increasing atelectasis in  the lingula with persistent atelectasis in the bases. Electronically Signed   By: Dorise Bullion III M.D   On: 05/09/2016 22:07   Dg Tibia/fibula Right  05/09/2016  CLINICAL DATA:  Pain without reported trauma. EXAM: RIGHT TIBIA AND FIBULA - 2 VIEW COMPARISON:  None. FINDINGS: Soft tissue edema. Vascular calcifications are seen. No fracture or dislocation. No bony erosion or evidence of osteomyelitis IMPRESSION: Soft tissue edema.  No underlying bony abnormality identified. Electronically Signed   By: Dorise Bullion III M.D   On: 05/09/2016 22:09   Dg Foot Complete Right  05/09/2016  CLINICAL DATA:  45 year old presenting with 2 day history of edema and erythema involving the right lower leg and right foot. Current history of diabetes. EXAM: RIGHT FOOT COMPLETE - 3+ VIEW COMPARISON:  04/29/2016. FINDINGS: No evidence of acute, subacute or healed fractures. Well preserved joint spaces. Well preserved bone mineral density. Small plantar calcaneal spur. Calcification involving the dorsalis pedis artery and the anterior and posterior tibial arteries. IMPRESSION: No acute or significant abnormalities. Electronically Signed   By: Evangeline Dakin M.D.   On: 05/09/2016 22:12    Chart has been reviewed    Assessment/Plan  45 y.o. female with medical history significant for asthma/COPD uncontrolled diabetes type 2,history of alcohol abuse, alcohol induced pancreatitis, diet hepatic ulcer in her right foot, hypertension, chronic diastolic heart failure, pulmonary hypertension, positive MRSA screen, morbid obesity with dyslipidemia  Admitted for sepsis due to cellulitis   Present on Admission:  . Sepsis ([place]) - Admit per Sepsis protocol likely source being  Cellulitis   - rehydrate with 61m/kg  - initiate broad spectrum antibiotics  Vancomycin and cefepime add Flagyl  -  obtain blood cultures  - Obtain serial lactic acid  - Obtain procalcitonin level  - Admit and monitor vital signs closely  -   PCCM has been consulted  administer stress dose steroids Sepsis - Repeat Assessment  Performed at:    11:30 PM  [facility]     Blood pressure 88/64, pulse 95, temperature 100.6 F (38.1 C), temperature source Oral, resp. rate 20, weight 106 kg (233 lb 11 oz), last menstrual period 04/19/2016, SpO2 86 %.  Heart:     Regular rate and rhythm  Lungs:    Wheezing  Capillary Refill:   <2 sec  Peripheral Pulse:   Radial pulse palpable  Skin:     Flushed   . Alcohol abuse - currently in remission . Alcohol-induced chronic pancreatitis ([place] - bowel rest, pain management . Chronic diastolic CHF (congestive heart failure) ([place] - currently hypotensive appears to be septic fluid resuscitation . Essential hypertension, benign - hypotensive hold home medications . Hypokalemia - will replace . Cellulitis - -admit per cellulitis protocol will         continue current antibiotic choice      plain films showed:  no evidence of air  no evidence of osteomyelitis   No     foreign   objects  tetanus shot has been given 2016      Will obtain MRSA screening,       obtain blood cultures given febrile or septic     further antibiotic adjustment pending above results  QT prolongation avoid QT prolonging medications History of asthma  - continue home inhalers Xopenex when necessary given tachycardia Diabetes mellitus continue home insulin on a sliding scale Other plan as per orders.  DVT prophylaxis:    Lovenox     Code Status:  FULL CODE  as per patient   Family Communication:   Family not  at  Bedside    Disposition Plan:     To home once workup is complete and patient is stable   Consults called: PCCM  Admission status: inpatient        Level of care    Stepdown         I have spent a total of 61  min on this admission  Extra time spent to discuss case with PCCM  [place] 05/09/2016, 11:43 PM    [facility]  Pager [phone]   after 2 AM please page floor  coverage PA If 7AM-7PM, please contact the day team taking care of the patient  [email or web address]  Password [email or web address]

## 2016-05-09 NOTE — ED Notes (Signed)
Antibiotic delay as patient is in xray

## 2016-05-09 NOTE — ED Notes (Signed)
Notified pharmacy need for 2nd blood culture collection

## 2016-05-09 NOTE — ED Provider Notes (Signed)
CSN: [id number]     Arrival date & time 05/09/16  1954 History   First MD Initiated Contact with Patient 05/09/16 2005     Chief Complaint  Patient presents with  . Chest Pain  . Shortness of Breath     (Consider location/radiation/quality/duration/timing/severity/associated sxs/prior Treatment) The history is provided by the patient.  SHANOAH ASBILL is a 45 y.o. female hx of DM, asthma, uncompliant with meds, here with leg cellulitis, shortness of breath. She states that she stepped on a nail several months ago and has intermittent pain since then. She states that she is diagnosed with MRSA previously and had finished a course of antibiotics. Came in 2 days ago with shortness of breath and CT angio showed no PE and patient was given albuterol for possible bronchitis. States that albuterol didn't help her shortness of breath. Has chills and worse cough. Also noticed redness of the entire calf for the last 2 days and severe pain in the right leg. States that she is in bed all day and blood sugar has been high.    Past Medical History  Diagnosis Date  . Diabetes mellitus without complication ([address])   . Asthma    Past Surgical History  Procedure Laterality Date  . Cesarean section     Family History  Problem Relation Age of Onset  . Emphysema Mother     smoked  . Allergies Mother   . Asthma Mother   . Breast cancer Mother   . Uterine cancer Mother   . Heart disease Maternal Grandmother   . Diabetes Father    Social History  Substance Use Topics  . Smoking status: Former Smoker -- 1.50 packs/day for 25 years    Types: Cigarettes    Quit date: 12/22/2011  . Smokeless tobacco: Never Used  . Alcohol Use: 0.0 oz/week    0 Standard drinks or equivalent per week   OB History    Gravida Para Term Preterm AB TAB SAB Ectopic Multiple Living   5 4 4       4      Review of Systems  Respiratory: Positive for shortness of breath.   Cardiovascular: Positive for chest pain.  Skin:  Positive for rash.  All other systems reviewed and are negative.     Allergies  Penicillins and Robitussin liquid center  Home Medications   Prior to Admission medications   Medication Sig Start Date End Date Taking? Authorizing Provider  albuterol (PROVENTIL HFA;VENTOLIN HFA) 108 (90 Base) MCG/ACT inhaler Inhale 2 puffs into the lungs every 4 (four) hours as needed for wheezing or shortness of breath.   Yes Historical Provider, MD  albuterol (PROVENTIL) (5 MG/ML) 0.5% nebulizer solution Take 0.5 mLs (2.5 mg total) by nebulization every 4 (four) hours as needed for wheezing or shortness of breath. Sob Patient taking differently: Take 2.5 mg by nebulization every 4 (four) hours. Sob 04/24/15  Yes Micheline Chapman, NP  atorvastatin (LIPITOR) 20 MG tablet Take 1 tablet (20 mg total) by mouth daily at 6 PM. 04/21/16  Yes Nita Sells, MD  blood glucose meter kit and supplies KIT Dispense based on patient and insurance preference. Use up to four times daily as directed. (FOR ICD-9 250.00, 250.01). 05/01/16  Yes Cherene Altes, MD  budesonide (PULMICORT) 0.5 MG/2ML nebulizer solution Take 0.5 mg by nebulization every 4 (four) hours.   Yes Historical Provider, MD  budesonide-formoterol (SYMBICORT) 80-4.5 MCG/ACT inhaler Take 2 puffs first thing in am and then another  2 puffs about 12 hours later. 04/21/16  Yes Nita Sells, MD  cloNIDine (CATAPRES) 0.1 MG tablet Take 1 tablet (0.1 mg total) by mouth 2 (two) times daily. 04/21/16  Yes Nita Sells, MD  diclofenac sodium (VOLTAREN) 1 % GEL Apply 1 application topically 4 (four) times daily. Patient taking differently: Apply 1 application topically 4 (four) times daily as needed (pain.).  05/30/15  Yes Janne Napoleon, NP  gabapentin (NEURONTIN) 300 MG capsule Take 1 capsule (300 mg total) by mouth 2 (two) times daily. 09/18/15  Yes Tiffany Daneil Dan, PA-C  gemfibrozil (LOPID) 600 MG tablet Take 1 tablet (600 mg total) by mouth 2 (two) times daily  before a meal. 04/21/16  Yes Nita Sells, MD  glucose blood test strip Use as instructed 05/01/16  Yes Cherene Altes, MD  ibuprofen (ADVIL) 200 MG tablet Take 1-2 tablets (200-400 mg total) by mouth every 6 (six) hours as needed for mild pain or moderate pain. 05/01/16  Yes Cherene Altes, MD  insulin detemir (LEVEMIR) 100 UNIT/ML injection Inject 0.18 mLs (18 Units total) into the skin 2 (two) times daily. Patient taking differently: Inject 22 Units into the skin 2 (two) times daily.  04/21/16  Yes Nita Sells, MD  Insulin Pen Needle 29G X 13MM MISC 1 Device by Does not apply route 4 (four) times daily - after meals and at bedtime. 04/21/16  Yes Nita Sells, MD  INSULIN SYRINGE 1CC/29G 29G X 1/2" 1 ML MISC 1 Syringe by Does not apply route 4 (four) times daily - after meals and at bedtime. 04/21/16  Yes Nita Sells, MD  Lancets MISC 1 Stick by Does not apply route 4 (four) times daily -  with meals and at bedtime. 05/01/16  Yes Cherene Altes, MD  Multiple Vitamin (MULTIVITAMIN WITH MINERALS) TABS tablet Take 1 tablet by mouth daily.   Yes Historical Provider, MD  oxyCODONE (ROXICODONE) 5 MG immediate release tablet Take 1 tablet (5 mg total) by mouth every 4 (four) hours as needed for severe pain. 05/07/16  Yes Deno Etienne, DO  pantoprazole (PROTONIX) 40 MG tablet Take 1 tablet (40 mg total) by mouth daily. Take 30-60 min before first meal of the day 09/18/15  Yes Tiffany S Noel, PA-C  potassium chloride SA (K-DUR,KLOR-CON) 20 MEQ tablet Take 1 tablet (20 mEq total) by mouth daily. 05/07/16  Yes Deno Etienne, DO  predniSONE (DELTASONE) 20 MG tablet 2 tabs po daily x 4 days Patient taking differently: Take 40 mg by mouth daily with breakfast. 2 tabs po daily x 4 days 05/07/16  Yes Deno Etienne, DO  Respiratory Therapy Supplies (FLUTTER) DEVI Use as directed 12/27/15  Yes Tanda Rockers, MD   BP 95/61 mmHg  Pulse 100  Temp(Src) 100.6 F (38.1 C) (Oral)  Resp 16  Wt 233 lb 11 oz  (106 kg)  SpO2 94%  LMP 04/19/2016 Physical Exam  Constitutional: She is oriented to person, place, and time.  Uncomfortable, coughing   HENT:  Head: Normocephalic.  Mouth/Throat: Oropharynx is clear and moist.  Eyes: Conjunctivae are normal. Pupils are equal, round, and reactive to light.  Neck: Normal range of motion. Neck supple.  Cardiovascular: Normal rate, regular rhythm and normal heart sounds.   Pulmonary/Chest:  Tachypneic, mild diffuse wheezing, no crackles   Abdominal: Soft. Bowel sounds are normal. She exhibits no distension. There is no tenderness.  Musculoskeletal: Normal range of motion. She exhibits no edema or tenderness.  Neurological: She is alert and oriented  to person, place, and time.  Skin: Skin is warm.  R calf and foot swollen, redness Right calf. No obvious crepitance, + diffuse tenderness. Able to wiggle toes, 2+ pulses   Psychiatric: She has a normal mood and affect. Her behavior is normal. Judgment and thought content normal.  Nursing note and vitals reviewed.   ED Course  Procedures (including critical care time) Labs Review Labs Reviewed  CBC WITH DIFFERENTIAL/PLATELET - Abnormal; Notable for the following:    WBC 13.6 (*)    Neutro Abs 8.9 (*)    Monocytes Absolute 1.1 (*)    All other components within normal limits  COMPREHENSIVE METABOLIC PANEL - Abnormal; Notable for the following:    Sodium 131 (*)    Potassium 3.3 (*)    Chloride 91 (*)    Glucose, Bld 119 (*)    Creatinine, Ser 1.03 (*)    Calcium 8.3 (*)    Albumin 2.4 (*)    ALT 13 (*)    All other components within normal limits  I-STAT CG4 LACTIC ACID, ED - Abnormal; Notable for the following:    Lactic Acid, Venous 2.40 (*)    All other components within normal limits  CBG MONITORING, ED - Abnormal; Notable for the following:    Glucose-Capillary 115 (*)    All other components within normal limits  CULTURE, BLOOD (ROUTINE X 2)  CULTURE, BLOOD (ROUTINE X 2)  LIPASE, BLOOD   I-STAT TROPOININ, ED  I-STAT CG4 LACTIC ACID, ED    Imaging Review Dg Chest 2 View  05/09/2016  CLINICAL DATA:  Pain. EXAM: CHEST  2 VIEW COMPARISON:  May 07, 2016 FINDINGS: Mild bibasilar atelectasis. Linear opacity in the lingula is probably atelectatic as well. No pneumothorax. No other interval changes or acute abnormalities. A rounded density in the left hilum is consistent with a vessel on end. No nodule was seen in this location on the recent CT scan. IMPRESSION: Increasing atelectasis in the lingula with persistent atelectasis in the bases. Electronically Signed   By: Dorise Bullion III M.D   On: 05/09/2016 22:07   Dg Tibia/fibula Right  05/09/2016  CLINICAL DATA:  Pain without reported trauma. EXAM: RIGHT TIBIA AND FIBULA - 2 VIEW COMPARISON:  None. FINDINGS: Soft tissue edema. Vascular calcifications are seen. No fracture or dislocation. No bony erosion or evidence of osteomyelitis IMPRESSION: Soft tissue edema.  No underlying bony abnormality identified. Electronically Signed   By: Dorise Bullion III M.D   On: 05/09/2016 22:09   Dg Foot Complete Right  05/09/2016  CLINICAL DATA:  45 year old presenting with 2 day history of edema and erythema involving the right lower leg and right foot. Current history of diabetes. EXAM: RIGHT FOOT COMPLETE - 3+ VIEW COMPARISON:  04/29/2016. FINDINGS: No evidence of acute, subacute or healed fractures. Well preserved joint spaces. Well preserved bone mineral density. Small plantar calcaneal spur. Calcification involving the dorsalis pedis artery and the anterior and posterior tibial arteries. IMPRESSION: No acute or significant abnormalities. Electronically Signed   By: Evangeline Dakin M.D.   On: 05/09/2016 22:12   I have personally reviewed and evaluated these images and lab results as part of my medical decision-making.   EKG Interpretation   Date/Time:  Saturday May 09 2016 20:05:07 EDT Ventricular Rate:  111 PR Interval:  122 QRS Duration:  124 QT Interval:  388 QTC Calculation: 527 R Axis:   -65 Text Interpretation:  Sinus tachycardia Right bundle branch block Left  anterior fascicular block  Possible Lateral infarct , age undetermined  Abnormal ECG No significant change since last tracing Confirmed by YAO   MD, DAVID ([id number]) on 05/09/2016 8:06:34 PM      MDM   Final diagnoses:  None   AMIRIA ORRISON is a 45 y.o. female here with fever, R leg pain and rash. She is febrile, tachycardic. Concerned for sepsis from the skin infection vs pneumonia. Code sepsis activated. Consider necrotizing fasciitis but has no obvious crepitance so will get xrays. Will give abx.   10:58 PM WBC 14. Cr 1.0. Xrays showed no free air, just soft tissue edema. PCN allergy, given vanc/cefepime. Will admit.      Wandra Arthurs, MD 05/09/16 2259

## 2016-05-09 NOTE — ED Notes (Signed)
Patient still of unit, verified that patient is still in radiology.

## 2016-05-09 NOTE — ED Notes (Signed)
Patient not in room yet 

## 2016-05-09 NOTE — ED Notes (Signed)
Pt was seen 2 days ago for chest pain, shortness of breath.  Onset several weeks MRSA in RLE that is worsening.

## 2016-05-10 ENCOUNTER — Inpatient Hospital Stay (HOSPITAL_COMMUNITY): Payer: MEDICAID

## 2016-05-10 DIAGNOSIS — L03115 Cellulitis of right lower limb: Secondary | ICD-10-CM

## 2016-05-10 DIAGNOSIS — A419 Sepsis, unspecified organism: Principal | ICD-10-CM

## 2016-05-10 DIAGNOSIS — J45909 Unspecified asthma, uncomplicated: Secondary | ICD-10-CM

## 2016-05-10 DIAGNOSIS — R6520 Severe sepsis without septic shock: Secondary | ICD-10-CM

## 2016-05-10 DIAGNOSIS — J45901 Unspecified asthma with (acute) exacerbation: Secondary | ICD-10-CM

## 2016-05-10 LAB — CORTISOL: CORTISOL PLASMA: 9.1 ug/dL

## 2016-05-10 LAB — GLUCOSE, CAPILLARY
GLUCOSE-CAPILLARY: 300 mg/dL — AB (ref 65–99)
GLUCOSE-CAPILLARY: 455 mg/dL — AB (ref 65–99)
Glucose-Capillary: 168 mg/dL — ABNORMAL HIGH (ref 65–99)
Glucose-Capillary: 479 mg/dL — ABNORMAL HIGH (ref 65–99)
Glucose-Capillary: 556 mg/dL (ref 65–99)
Glucose-Capillary: 415 mg/dL — ABNORMAL HIGH (ref 65–99)
Glucose-Capillary: 360 mg/dL — ABNORMAL HIGH (ref 65–99)
Glucose-Capillary: 337 mg/dL — ABNORMAL HIGH (ref 65–99)

## 2016-05-10 LAB — COMPREHENSIVE METABOLIC PANEL
ALK PHOS: 47 U/L (ref 38–126)
ALT: 10 U/L — AB (ref 14–54)
AST: 13 U/L — ABNORMAL LOW (ref 15–41)
Albumin: 1.8 g/dL — ABNORMAL LOW (ref 3.5–5.0)
Anion gap: 10 (ref 5–15)
BILIRUBIN TOTAL: 1.1 mg/dL (ref 0.3–1.2)
BUN: 8 mg/dL (ref 6–20)
CALCIUM: 7.2 mg/dL — AB (ref 8.9–10.3)
CO2: 25 mmol/L (ref 22–32)
CREATININE: 0.85 mg/dL (ref 0.44–1.00)
Chloride: 97 mmol/L — ABNORMAL LOW (ref 101–111)
Glucose, Bld: 260 mg/dL — ABNORMAL HIGH (ref 65–99)
Potassium: 4.2 mmol/L (ref 3.5–5.1)
Sodium: 132 mmol/L — ABNORMAL LOW (ref 135–145)
Total Protein: 5.8 g/dL — ABNORMAL LOW (ref 6.5–8.1)

## 2016-05-10 LAB — URINALYSIS, ROUTINE W REFLEX MICROSCOPIC
Bilirubin Urine: NEGATIVE
GLUCOSE, UA: 100 mg/dL — AB
KETONES UR: 15 mg/dL — AB
LEUKOCYTES UA: NEGATIVE
NITRITE: NEGATIVE
PROTEIN: NEGATIVE mg/dL
Specific Gravity, Urine: 1.01 (ref 1.005–1.030)
pH: 6 (ref 5.0–8.0)

## 2016-05-10 LAB — CBC
HCT: 33.9 % — ABNORMAL LOW (ref 36.0–46.0)
Hemoglobin: 10.7 g/dL — ABNORMAL LOW (ref 12.0–15.0)
MCH: 27.9 pg (ref 26.0–34.0)
MCHC: 31.6 g/dL (ref 30.0–36.0)
MCV: 88.3 fL (ref 78.0–100.0)
PLATELETS: 233 10*3/uL (ref 150–400)
RBC: 3.84 MIL/uL — AB (ref 3.87–5.11)
RDW: 13.6 % (ref 11.5–15.5)
WBC: 9.7 10*3/uL (ref 4.0–10.5)

## 2016-05-10 LAB — RAPID URINE DRUG SCREEN, HOSP PERFORMED
Amphetamines: NOT DETECTED
BENZODIAZEPINES: NOT DETECTED
Barbiturates: NOT DETECTED
COCAINE: NOT DETECTED
Opiates: POSITIVE — AB
Tetrahydrocannabinol: NOT DETECTED

## 2016-05-10 LAB — BASIC METABOLIC PANEL
ANION GAP: 9 (ref 5–15)
BUN: 13 mg/dL (ref 6–20)
CALCIUM: 6.9 mg/dL — AB (ref 8.9–10.3)
CO2: 20 mmol/L — AB (ref 22–32)
Chloride: 97 mmol/L — ABNORMAL LOW (ref 101–111)
Creatinine, Ser: 1.04 mg/dL — ABNORMAL HIGH (ref 0.44–1.00)
GFR calc Af Amer: >60 mL/min (ref >60)
GFR calc non Af Amer: >60 mL/min (ref >60)
GLUCOSE: 530 mg/dL — AB (ref 65–99)
Potassium: 4.3 mmol/L (ref 3.5–5.1)
Sodium: 126 mmol/L — ABNORMAL LOW (ref 135–145)

## 2016-05-10 LAB — PROTIME-INR
INR: 1.32 (ref 0.00–1.49)
PROTHROMBIN TIME: 16.5 s — AB (ref 11.6–15.2)

## 2016-05-10 LAB — HIV ANTIBODY (ROUTINE TESTING W REFLEX): HIV SCREEN 4TH GENERATION: NONREACTIVE

## 2016-05-10 LAB — TSH: TSH: 0.483 u[IU]/mL (ref 0.350–4.500)

## 2016-05-10 LAB — URINE MICROSCOPIC-ADD ON

## 2016-05-10 LAB — LACTIC ACID, PLASMA
LACTIC ACID, VENOUS: 0.9 mmol/L (ref 0.5–2.0)
Lactic Acid, Venous: 1 mmol/L (ref 0.5–2.0)

## 2016-05-10 LAB — MAGNESIUM: Magnesium: 1.3 mg/dL — ABNORMAL LOW (ref 1.7–2.4)

## 2016-05-10 LAB — PROCALCITONIN: PROCALCITONIN: 0.62 ng/mL

## 2016-05-10 LAB — PHOSPHORUS: Phosphorus: 2.6 mg/dL (ref 2.5–4.6)

## 2016-05-10 LAB — ETHANOL: Alcohol, Ethyl (B): <5 mg/dL (ref <5)

## 2016-05-10 LAB — APTT: aPTT: 34 seconds (ref 24–37)

## 2016-05-10 MED ORDER — POTASSIUM CHLORIDE 10 MEQ/100ML IV SOLN
10.0000 meq | INTRAVENOUS | Status: AC
  Administered 2016-05-10 (×3): 10 meq via INTRAVENOUS
  Filled 2016-05-10 (×4): qty 100

## 2016-05-10 MED ORDER — INSULIN ASPART 100 UNIT/ML ~~LOC~~ SOLN
15.0000 [IU] | Freq: Once | SUBCUTANEOUS | Status: AC
  Administered 2016-05-10: 15 [IU] via SUBCUTANEOUS

## 2016-05-10 MED ORDER — GUAIFENESIN ER 600 MG PO TB12
600.0000 mg | ORAL_TABLET | Freq: Two times a day (BID) | ORAL | Status: DC
  Administered 2016-05-10 – 2016-05-14 (×10): 600 mg via ORAL
  Filled 2016-05-10 (×10): qty 1

## 2016-05-10 MED ORDER — SODIUM CHLORIDE 0.9 % IV SOLN
INTRAVENOUS | Status: DC
Start: 2016-05-10 — End: 2016-05-11
  Administered 2016-05-10: 13:00:00 via INTRAVENOUS

## 2016-05-10 MED ORDER — SODIUM CHLORIDE 0.9% FLUSH
3.0000 mL | Freq: Two times a day (BID) | INTRAVENOUS | Status: DC
  Administered 2016-05-10 – 2016-05-14 (×4): 3 mL via INTRAVENOUS

## 2016-05-10 MED ORDER — INSULIN REGULAR BOLUS VIA INFUSION
0.0000 [IU] | Freq: Three times a day (TID) | INTRAVENOUS | Status: DC
  Administered 2016-05-11: 3.8 [IU] via INTRAVENOUS
  Filled 2016-05-10: qty 10

## 2016-05-10 MED ORDER — LEVALBUTEROL HCL 0.63 MG/3ML IN NEBU: 0.6300 mg | INHALATION_SOLUTION | RESPIRATORY_TRACT | Status: DC | PRN

## 2016-05-10 MED ORDER — SODIUM CHLORIDE 0.9 % IV SOLN
INTRAVENOUS | Status: DC
  Administered 2016-05-10: 20:00:00 via INTRAVENOUS

## 2016-05-10 MED ORDER — IPRATROPIUM-ALBUTEROL 0.5-2.5 (3) MG/3ML IN SOLN
3.0000 mL | Freq: Four times a day (QID) | RESPIRATORY_TRACT | Status: DC
  Administered 2016-05-10 (×4): 3 mL via RESPIRATORY_TRACT
  Filled 2016-05-10 (×4): qty 3

## 2016-05-10 MED ORDER — DEXTROSE 5 % IV SOLN
2.0000 g | Freq: Two times a day (BID) | INTRAVENOUS | Status: DC
  Administered 2016-05-10 – 2016-05-11 (×2): 2 g via INTRAVENOUS
  Filled 2016-05-10 (×3): qty 2

## 2016-05-10 MED ORDER — IPRATROPIUM BROMIDE 0.02 % IN SOLN: 0.5000 mg | Freq: Four times a day (QID) | RESPIRATORY_TRACT | Status: DC

## 2016-05-10 MED ORDER — INSULIN ASPART 100 UNIT/ML ~~LOC~~ SOLN: 0.0000 [IU] | Freq: Every day | SUBCUTANEOUS | Status: DC

## 2016-05-10 MED ORDER — ACETAMINOPHEN 325 MG PO TABS: 650.0000 mg | ORAL_TABLET | Freq: Four times a day (QID) | ORAL | Status: DC | PRN

## 2016-05-10 MED ORDER — SODIUM CHLORIDE 0.9 % IV SOLN
INTRAVENOUS | Status: DC
  Administered 2016-05-10: 4 [IU]/h via INTRAVENOUS
  Filled 2016-05-10: qty 2.5

## 2016-05-10 MED ORDER — OXYCODONE HCL 5 MG PO TABS: 5.0000 mg | ORAL_TABLET | ORAL | Status: DC | PRN

## 2016-05-10 MED ORDER — IPRATROPIUM-ALBUTEROL 0.5-2.5 (3) MG/3ML IN SOLN
3.0000 mL | Freq: Two times a day (BID) | RESPIRATORY_TRACT | Status: DC
  Administered 2016-05-11 – 2016-05-14 (×7): 3 mL via RESPIRATORY_TRACT
  Filled 2016-05-10 (×7): qty 3

## 2016-05-10 MED ORDER — GABAPENTIN 300 MG PO CAPS
300.0000 mg | ORAL_CAPSULE | Freq: Two times a day (BID) | ORAL | Status: DC
  Administered 2016-05-10 – 2016-05-14 (×10): 300 mg via ORAL
  Filled 2016-05-10 (×10): qty 1

## 2016-05-10 MED ORDER — INSULIN ASPART 100 UNIT/ML ~~LOC~~ SOLN
10.0000 [IU] | Freq: Once | SUBCUTANEOUS | Status: AC
Start: 2016-05-10 — End: 2016-05-10
  Administered 2016-05-10: 10 [IU] via SUBCUTANEOUS

## 2016-05-10 MED ORDER — MOMETASONE FURO-FORMOTEROL FUM 100-5 MCG/ACT IN AERO
2.0000 | INHALATION_SPRAY | Freq: Two times a day (BID) | RESPIRATORY_TRACT | Status: DC
  Administered 2016-05-10 (×2): 2 via RESPIRATORY_TRACT
  Filled 2016-05-10: qty 8.8

## 2016-05-10 MED ORDER — DEXTROSE 50 % IV SOLN: 25.0000 mL | INTRAVENOUS | Status: DC | PRN

## 2016-05-10 MED ORDER — SODIUM CHLORIDE 0.9 % IV BOLUS (SEPSIS)
1000.0000 mL | Freq: Once | INTRAVENOUS | Status: AC
  Administered 2016-05-10: 1000 mL via INTRAVENOUS

## 2016-05-10 MED ORDER — INSULIN ASPART 100 UNIT/ML ~~LOC~~ SOLN: 0.0000 [IU] | Freq: Three times a day (TID) | SUBCUTANEOUS | Status: DC

## 2016-05-10 MED ORDER — HYDROCODONE-ACETAMINOPHEN 5-325 MG PO TABS
1.0000 | ORAL_TABLET | ORAL | Status: DC | PRN
  Administered 2016-05-10: 1 via ORAL
  Administered 2016-05-10 – 2016-05-14 (×7): 2 via ORAL
  Filled 2016-05-10: qty 2
  Filled 2016-05-10: qty 1
  Filled 2016-05-10 (×6): qty 2

## 2016-05-10 MED ORDER — BUDESONIDE 0.5 MG/2ML IN SUSP
0.5000 mg | RESPIRATORY_TRACT | Status: DC
  Administered 2016-05-10 (×5): 0.5 mg via RESPIRATORY_TRACT
  Filled 2016-05-10 (×5): qty 2

## 2016-05-10 MED ORDER — BUDESONIDE 0.5 MG/2ML IN SUSP
0.5000 mg | Freq: Two times a day (BID) | RESPIRATORY_TRACT | Status: DC
  Administered 2016-05-11 – 2016-05-14 (×7): 0.5 mg via RESPIRATORY_TRACT
  Filled 2016-05-10 (×7): qty 2

## 2016-05-10 MED ORDER — ENOXAPARIN SODIUM 60 MG/0.6ML ~~LOC~~ SOLN
50.0000 mg | SUBCUTANEOUS | Status: DC
  Administered 2016-05-10 – 2016-05-14 (×5): 50 mg via SUBCUTANEOUS
  Filled 2016-05-10 (×6): qty 0.6

## 2016-05-10 MED ORDER — ALBUTEROL SULFATE (2.5 MG/3ML) 0.083% IN NEBU: 2.5000 mg | INHALATION_SOLUTION | RESPIRATORY_TRACT | Status: DC

## 2016-05-10 MED ORDER — ACETAMINOPHEN 650 MG RE SUPP: 650.0000 mg | Freq: Four times a day (QID) | RECTAL | Status: DC | PRN

## 2016-05-10 MED ORDER — METRONIDAZOLE IN NACL 5-0.79 MG/ML-% IV SOLN
500.0000 mg | Freq: Three times a day (TID) | INTRAVENOUS | Status: DC
  Administered 2016-05-10 – 2016-05-12 (×7): 500 mg via INTRAVENOUS
  Filled 2016-05-10 (×8): qty 100

## 2016-05-10 MED ORDER — BOOST / RESOURCE BREEZE PO LIQD
1.0000 | Freq: Three times a day (TID) | ORAL | Status: DC
  Administered 2016-05-10 – 2016-05-11 (×4): 1 via ORAL

## 2016-05-10 MED ORDER — PANTOPRAZOLE SODIUM 40 MG PO TBEC
40.0000 mg | DELAYED_RELEASE_TABLET | Freq: Every day | ORAL | Status: DC
  Administered 2016-05-10 – 2016-05-14 (×5): 40 mg via ORAL
  Filled 2016-05-10 (×5): qty 1

## 2016-05-10 MED ORDER — HYDROCORTISONE NA SUCCINATE PF 100 MG IJ SOLR
100.0000 mg | Freq: Four times a day (QID) | INTRAMUSCULAR | Status: DC
  Administered 2016-05-10 – 2016-05-11 (×6): 100 mg via INTRAVENOUS
  Filled 2016-05-10 (×6): qty 2

## 2016-05-10 MED ORDER — INSULIN ASPART 100 UNIT/ML ~~LOC~~ SOLN
0.0000 [IU] | Freq: Three times a day (TID) | SUBCUTANEOUS | Status: DC
  Administered 2016-05-10: 5 [IU] via SUBCUTANEOUS

## 2016-05-10 MED ORDER — ATORVASTATIN CALCIUM 20 MG PO TABS
20.0000 mg | ORAL_TABLET | Freq: Every day | ORAL | Status: DC
  Administered 2016-05-10 – 2016-05-13 (×4): 20 mg via ORAL
  Filled 2016-05-10: qty 2
  Filled 2016-05-10: qty 1
  Filled 2016-05-10: qty 2
  Filled 2016-05-10: qty 1

## 2016-05-10 MED ORDER — INSULIN DETEMIR 100 UNIT/ML ~~LOC~~ SOLN
22.0000 [IU] | Freq: Two times a day (BID) | SUBCUTANEOUS | Status: DC
  Administered 2016-05-10 (×2): 22 [IU] via SUBCUTANEOUS
  Filled 2016-05-10 (×3): qty 0.22

## 2016-05-10 MED ORDER — IOPAMIDOL (ISOVUE-300) INJECTION 61%
INTRAVENOUS | Status: AC
  Administered 2016-05-11: 100 mL
  Filled 2016-05-10: qty 100

## 2016-05-10 MED ORDER — DEXTROSE-NACL 5-0.45 % IV SOLN
INTRAVENOUS | Status: DC
  Administered 2016-05-10: 07:00:00 via INTRAVENOUS

## 2016-05-10 MED ORDER — POTASSIUM CHLORIDE CRYS ER 20 MEQ PO TBCR
20.0000 meq | EXTENDED_RELEASE_TABLET | Freq: Every day | ORAL | Status: DC
  Administered 2016-05-10 – 2016-05-14 (×5): 20 meq via ORAL
  Filled 2016-05-10 (×5): qty 1

## 2016-05-10 MED ORDER — OXYCODONE HCL 5 MG PO TABS
5.0000 mg | ORAL_TABLET | ORAL | Status: DC | PRN
  Administered 2016-05-10 (×2): 10 mg via ORAL
  Filled 2016-05-10 (×2): qty 2

## 2016-05-10 NOTE — Progress Notes (Signed)
Patient states that she will place BiPAP on when ready to sleep. RT set BiPAP (black box) up at bedside and instructed patient on use. RT will continue to monitor.

## 2016-05-10 NOTE — Progress Notes (Signed)
PROGRESS NOTE    Carla Hicks  ZOX:[id number] DOB: [birth year] DOA: 05/09/2016 PCP: Lora Paula, MD   Brief Narrative:  45 year old female with history of asthma, alcoholic pancreatitis, DKA admissions, diabetes, presented to the emergency room with complaints of right lower extremity pain and swelling. Patient treated for cellulitis. The CCM as well as general surgery consulted. Assessment & Plan   Severe Sepsis secondary to right lower extremity cellulitis -Upon admission, patient was febrile with tachycardia, leukocytosis, tachypnea -Patient was also mildly hypotensive with elevated lactic acid -Leukocytosis as well as lactic acid level had normalized -Patient did have hypotension which is responding to IV fluids -CCM consulted for hypotension- patient placed on Solu-Cortef -Continue broad-spectrum antibiotics with vancomycin, cefepime, flagyl -Blood cultures pending -Gen. surgery consulted and appreciated, recommended continuing antibiotics, if cellulitis extends beyond the marked area, obtain CT or MRI  Acute kidney injury -Resolved, likely secondary to sepsis  Diabetes mellitus, type II -Continue Levemir, insulin sliding scale CBG monitoring  History of asthma -Supposedly quit smoking 4 days ago -Does not appear to be in exacerbation at this time -Continue supplemental oxygen to maintain saturations above 90% -Continue nebulizers as needed -Patient currently on steroids for hypotension  Essential hypertension -Home medications held  Tobacco abuse -Patient counseled regarding smoking cessation  History of alcoholism and alcoholic induced pancreatitis -Alcohol level currently unremarkable -Continue to monitor closely  QT prolongation -Avoid medications with QT prolonging effects -Continue to monitor telemetry  DVT Prophylaxis  Lovenox  Code Status: Full  Family Communication: None at bedside  Disposition Plan: Admitted, continue to monitor and  stepdown  Consultants PCCM Gen. Surgery  Procedures  none  Antibiotics   Anti-infectives    Start     Dose/Rate Route Frequency Ordered Stop   05/10/16 1800  ceFEPIme (MAXIPIME) 2 g in dextrose 5 % 50 mL IVPB     2 g 100 mL/hr over 30 Minutes Intravenous Every 12 hours 05/10/16 0736     05/10/16 1100  vancomycin (VANCOCIN) IVPB 750 mg/150 ml premix     750 mg 150 mL/hr over 60 Minutes Intravenous Every 12 hours 05/09/16 2208     05/10/16 0800  ceFEPIme (MAXIPIME) 1 g in dextrose 5 % 50 mL IVPB  Status:  Discontinued     1 g 100 mL/hr over 30 Minutes Intravenous Every 8 hours 05/09/16 2208 05/10/16 0736   05/10/16 0200  metroNIDAZOLE (FLAGYL) IVPB 500 mg     500 mg 100 mL/hr over 60 Minutes Intravenous Every 8 hours 05/10/16 0101     05/09/16 2130  ceFEPIme (MAXIPIME) 2 g in dextrose 5 % 50 mL IVPB     2 g 100 mL/hr over 30 Minutes Intravenous  Once 05/09/16 2127 05/09/16 2253   05/09/16 2130  vancomycin (VANCOCIN) 2,000 mg in sodium chloride 0.9 % 500 mL IVPB     2,000 mg 250 mL/hr over 120 Minutes Intravenous  Once 05/09/16 2127 05/10/16 0157      Subjective:   Joneen Roach seen and examined today.   Denies chest pain, shortness of breath, abdominal pain, nausea or vomiting. Does continue to have pain in her right leg.  Objective:   Filed Vitals:   05/10/16 0800 05/10/16 0900 05/10/16 1000 05/10/16 1200  BP:  97/64 94/64   Pulse:  89 91   Temp: 98.4 F (36.9 C)   97.5 F (36.4 C)  TempSrc: Oral   Oral  Resp:      Height:  Weight:      SpO2:  99% 93%     Intake/Output Summary (Last 24 hours) at 05/10/16 1352 Last data filed at 05/10/16 0631  Gross per 24 hour  Intake   3040 ml  Output    650 ml  Net   2390 ml   Filed Weights   05/09/16 2122 05/10/16 0101  Weight: 106 kg (233 lb 11 oz) 102 kg (224 lb 13.9 oz)    Exam  General: Well developed, well nourished, NAD, appears stated age  HEENT: NCAT,  mucous membranes moist.   Cardiovascular: S1  S2 auscultated, RRR, no murmurs  Respiratory: Clear to auscultation bilaterally  Abdomen: Soft, obese, nontender, nondistended, + bowel sounds  Extremities: warm dry without cyanosis clubbing. Erythema RLE  Neuro: AAOx3, nonfocal  Skin: RLE erythema, edema, blistering- area marked   Psych: Normal affect and demeanor   Data Reviewed: I have personally reviewed following labs and imaging studies  CBC:  Recent Labs Lab 05/07/16 1205 05/09/16 2045 05/10/16 0558  WBC 9.4 13.6* 9.7  NEUTROABS 6.7 8.9*  --   HGB 11.5* 13.0 10.7*  HCT 34.8* 39.6 33.9*  MCV 87.9 89.4 88.3  PLT 299 295 233   Basic Metabolic Panel:  Recent Labs Lab 05/07/16 1205 05/09/16 2045 05/10/16 0558  NA 137 131* 132*  K 2.7* 3.3* 4.2  CL 97* 91* 97*  CO2 27 27 25   GLUCOSE 316* 119* 260*  BUN <5* 6 8  CREATININE 0.71 1.03* 0.85  CALCIUM 8.7* 8.3* 7.2*  MG  --   --  1.3*  PHOS  --   --  2.6   GFR: Estimated Creatinine Clearance: 98.1 mL/min (by C-G formula based on Cr of 0.85). Liver Function Tests:  Recent Labs Lab 05/09/16 2045 05/10/16 0558  AST 27 13*  ALT 13* 10*  ALKPHOS 59 47  BILITOT 1.1 1.1  PROT 7.4 5.8*  ALBUMIN 2.4* 1.8*    Recent Labs Lab 05/09/16 2045  LIPASE 13   No results for input(s): AMMONIA in the last 168 hours. Coagulation Profile:  Recent Labs Lab 05/10/16 0620  INR 1.32   Cardiac Enzymes: No results for input(s): CKTOTAL, CKMB, CKMBINDEX, TROPONINI in the last 168 hours. BNP (last 3 results) No results for input(s): PROBNP in the last 8760 hours. HbA1C: No results for input(s): HGBA1C in the last 72 hours. CBG:  Recent Labs Lab 05/09/16 2126 05/10/16 0145 05/10/16 0803 05/10/16 1218  GLUCAP 115* 168* 300* 479*   Lipid Profile: No results for input(s): CHOL, HDL, LDLCALC, TRIG, CHOLHDL, LDLDIRECT in the last 72 hours. Thyroid Function Tests:  Recent Labs  05/10/16 0558  TSH 0.483   Anemia Panel: No results for input(s):  VITAMINB12, FOLATE, FERRITIN, TIBC, IRON, RETICCTPCT in the last 72 hours. Urine analysis:    Component Value Date/Time   COLORURINE YELLOW 05/10/2016 0639   APPEARANCEUR CLOUDY* 05/10/2016 0639   LABSPEC 1.010 05/10/2016 0639   PHURINE 6.0 05/10/2016 0639   GLUCOSEU 100* 05/10/2016 0639   HGBUR TRACE* 05/10/2016 0639   BILIRUBINUR NEGATIVE 05/10/2016 0639   BILIRUBINUR neg 09/18/2015 1131   KETONESUR 15* 05/10/2016 0639   PROTEINUR NEGATIVE 05/10/2016 0639   PROTEINUR 30 09/18/2015 1131   UROBILINOGEN 0.2 09/18/2015 1131   UROBILINOGEN 0.2 12/11/2014 1522   NITRITE NEGATIVE 05/10/2016 0639   NITRITE neg 09/18/2015 1131   LEUKOCYTESUR NEGATIVE 05/10/2016 0639   Sepsis Labs: @LABRCNTIP (procalcitonin:4,lacticidven:4)  ) Recent Results (from the past 240 hour(s))  Blood culture (routine x  2)     Status: None (Preliminary result)   Collection Time: 05/09/16  8:45 PM  Result Value Ref Range Status   Specimen Description BLOOD LEFT ANTECUBITAL  Final   Special Requests BOTTLES DRAWN AEROBIC AND ANAEROBIC 5CC   Final   Culture NO GROWTH < 24 HOURS  Final   Report Status PENDING  Incomplete      Radiology Studies: Dg Chest 2 View  05/09/2016  CLINICAL DATA:  Pain. EXAM: CHEST  2 VIEW COMPARISON:  May 07, 2016 FINDINGS: Mild bibasilar atelectasis. Linear opacity in the lingula is probably atelectatic as well. No pneumothorax. No other interval changes or acute abnormalities. A rounded density in the left hilum is consistent with a vessel on end. No nodule was seen in this location on the recent CT scan. IMPRESSION: Increasing atelectasis in the lingula with persistent atelectasis in the bases. Electronically Signed   By: Gerome Sam III M.D   On: 05/09/2016 22:07   Dg Tibia/fibula Right  05/09/2016  CLINICAL DATA:  Pain without reported trauma. EXAM: RIGHT TIBIA AND FIBULA - 2 VIEW COMPARISON:  None. FINDINGS: Soft tissue edema. Vascular calcifications are seen. No fracture or  dislocation. No bony erosion or evidence of osteomyelitis IMPRESSION: Soft tissue edema.  No underlying bony abnormality identified. Electronically Signed   By: Gerome Sam III M.D   On: 05/09/2016 22:09   Dg Foot Complete Right  05/09/2016  CLINICAL DATA:  45 year old presenting with 2 day history of edema and erythema involving the right lower leg and right foot. Current history of diabetes. EXAM: RIGHT FOOT COMPLETE - 3+ VIEW COMPARISON:  04/29/2016. FINDINGS: No evidence of acute, subacute or healed fractures. Well preserved joint spaces. Well preserved bone mineral density. Small plantar calcaneal spur. Calcification involving the dorsalis pedis artery and the anterior and posterior tibial arteries. IMPRESSION: No acute or significant abnormalities. Electronically Signed   By: Hulan Saas M.D.   On: 05/09/2016 22:12     Scheduled Meds: . atorvastatin  20 mg Oral q1800  . budesonide  0.5 mg Nebulization Q4H  . ceFEPime (MAXIPIME) IV  2 g Intravenous Q12H  . enoxaparin (LOVENOX) injection  50 mg Subcutaneous Q24H  . feeding supplement  1 Container Oral TID BM  . gabapentin  300 mg Oral BID  . guaiFENesin  600 mg Oral BID  . hydrocortisone sod succinate (SOLU-CORTEF) inj  100 mg Intravenous Q6H  . insulin aspart  0-20 Units Subcutaneous TID WC  . insulin detemir  22 Units Subcutaneous BID  . ipratropium-albuterol  3 mL Nebulization Q6H  . metronidazole  500 mg Intravenous Q8H  . mometasone-formoterol  2 puff Inhalation BID  . pantoprazole  40 mg Oral Daily  . potassium chloride SA  20 mEq Oral Daily  . sodium chloride flush  3 mL Intravenous Q12H  . vancomycin  750 mg Intravenous Q12H   Continuous Infusions: . sodium chloride 75 mL/hr at 05/10/16 1316     LOS: 1 day   Time Spent in minutes   30 minutes  Shamara Soza D.O. on 05/10/2016 at 1:52 PM  Between 7am to 7pm - Pager - [phone]  After 7pm go to [email or web address] - password [email or web address]  And look for the night  coverage person covering for me after hours  [facility] Office  [phone]

## 2016-05-10 NOTE — Consult Note (Signed)
Reason for Consult:RLE infection Referring Physician: Annina Piotrowski is an 45 y.o. female.  HPI:  Pt is a 45 yo F who has been in the hospital multiple times this month for asthma, pancreatitis and DKA was admitted this time for fevers and RLE pain/swelling and redness.  Plain films negative for osteomyelitis.  She had leukocytosis to 13 on admission, but this is down.  She denies drainage from the wound, but does have blistering.  She denies rapid progression of wound.    She also has a history of alcohol abuse, drinking 12 beers per day.    Past Medical History  Diagnosis Date  . Diabetes mellitus without complication ([place])   . Asthma     Past Surgical History  Procedure Laterality Date  . Cesarean section      Family History  Problem Relation Age of Onset  . Emphysema Mother     smoked  . Allergies Mother   . Asthma Mother   . Breast cancer Mother   . Uterine cancer Mother   . Heart disease Maternal Grandmother   . Diabetes Father     Social History:  reports that she quit smoking about 4 years ago. Her smoking use included Cigarettes. She has a 37.5 pack-year smoking history. She has never used smokeless tobacco. She reports that she drinks alcohol. She reports that she does not use illicit drugs.  Allergies:  Allergies  Allergen Reactions  . Penicillins Hives    Has patient had a PCN reaction causing immediate rash, facial/tongue/throat swelling, SOB or lightheadedness with hypotension: yes Has patient had a PCN reaction causing severe rash involving mucus membranes or skin necrosis: no Has patient had a PCN reaction that required hospitalization no Has patient had a PCN reaction occurring within the last 10 years: no If all of the above answers are "NO", then may proceed with Cephalosporin use.   . [place] [Menthol] Hives    Medications:  Prior to Admission:  Prescriptions prior to admission  Medication Sig Dispense Refill Last Dose   . albuterol (PROVENTIL HFA;VENTOLIN HFA) 108 (90 Base) MCG/ACT inhaler Inhale 2 puffs into the lungs every 4 (four) hours as needed for wheezing or shortness of breath.   05/09/2016 at Unknown time  . albuterol (PROVENTIL) (5 MG/ML) 0.5% nebulizer solution Take 0.5 mLs (2.5 mg total) by nebulization every 4 (four) hours as needed for wheezing or shortness of breath. Sob (Patient taking differently: Take 2.5 mg by nebulization every 4 (four) hours. Sob) 20 mL 5 05/09/2016 at Unknown time  . atorvastatin (LIPITOR) 20 MG tablet Take 1 tablet (20 mg total) by mouth daily at 6 PM. 30 tablet 0 05/08/2016 at Unknown time  . blood glucose meter kit and supplies KIT Dispense based on patient and insurance preference. Use up to four times daily as directed. (FOR ICD-9 250.00, 250.01). 1 each 0   . budesonide (PULMICORT) 0.5 MG/2ML nebulizer solution Take 0.5 mg by nebulization every 4 (four) hours.   05/09/2016 at Unknown time  . budesonide-formoterol (SYMBICORT) 80-4.5 MCG/ACT inhaler Take 2 puffs first thing in am and then another 2 puffs about 12 hours later. 1 Inhaler 11 05/09/2016 at Unknown time  . cloNIDine (CATAPRES) 0.1 MG tablet Take 1 tablet (0.1 mg total) by mouth 2 (two) times daily. 60 tablet 11 05/09/2016 at Unknown time  . diclofenac sodium (VOLTAREN) 1 % GEL Apply 1 application topically 4 (four) times daily. (Patient taking differently: Apply 1 application topically  4 (four) times daily as needed (pain.). ) 100 g 0 2 months  . gabapentin (NEURONTIN) 300 MG capsule Take 1 capsule (300 mg total) by mouth 2 (two) times daily. 60 capsule 5 05/09/2016 at Unknown time  . gemfibrozil (LOPID) 600 MG tablet Take 1 tablet (600 mg total) by mouth 2 (two) times daily before a meal. 60 tablet 12 05/09/2016 at Unknown time  . glucose blood test strip Use as instructed 120 each 0   . ibuprofen (ADVIL) 200 MG tablet Take 1-2 tablets (200-400 mg total) by mouth every 6 (six) hours as needed for mild pain or moderate  pain. 30 tablet 0 05/09/2016 at 1130  . insulin detemir (LEVEMIR) 100 UNIT/ML injection Inject 0.18 mLs (18 Units total) into the skin 2 (two) times daily. (Patient taking differently: Inject 22 Units into the skin 2 (two) times daily. ) 10 mL 11 05/09/2016 at Unknown time  . Insulin Pen Needle 29G X 13MM MISC 1 Device by Does not apply route 4 (four) times daily - after meals and at bedtime. 90 each 12   . INSULIN SYRINGE 1CC/29G 29G X 1/2" 1 ML MISC 1 Syringe by Does not apply route 4 (four) times daily - after meals and at bedtime. 90 each 0   . Lancets MISC 1 Stick by Does not apply route 4 (four) times daily -  with meals and at bedtime. 120 each 0   . Multiple Vitamin (MULTIVITAMIN WITH MINERALS) TABS tablet Take 1 tablet by mouth daily.   05/09/2016 at Unknown time  . oxyCODONE (ROXICODONE) 5 MG immediate release tablet Take 1 tablet (5 mg total) by mouth every 4 (four) hours as needed for severe pain. 13 tablet 0 05/09/2016 at 0900  . pantoprazole (PROTONIX) 40 MG tablet Take 1 tablet (40 mg total) by mouth daily. Take 30-60 min before first meal of the day 30 tablet 2 05/09/2016 at Unknown time  . potassium chloride SA (K-DUR,KLOR-CON) 20 MEQ tablet Take 1 tablet (20 mEq total) by mouth daily. 5 tablet 0 05/09/2016 at Unknown time  . predniSONE (DELTASONE) 20 MG tablet 2 tabs po daily x 4 days (Patient taking differently: Take 40 mg by mouth daily with breakfast. 2 tabs po daily x 4 days) 8 tablet 0 05/09/2016 at Unknown time  . Respiratory Therapy Supplies (FLUTTER) DEVI Use as directed 1 each 0     Results for orders placed or performed during the hospital encounter of 05/09/16 (from the past 48 hour(s))  CBC with Differential     Status: Abnormal   Collection Time: 05/09/16  8:45 PM  Result Value Ref Range   WBC 13.6 (H) 4.0 - 10.5 K/uL   RBC 4.43 3.87 - 5.11 MIL/uL   Hemoglobin 13.0 12.0 - 15.0 g/dL   HCT 39.6 36.0 - 46.0 %   MCV 89.4 78.0 - 100.0 fL   MCH 29.3 26.0 - 34.0 pg   MCHC 32.8  30.0 - 36.0 g/dL   RDW 13.5 11.5 - 15.5 %   Platelets 295 150 - 400 K/uL   Neutrophils Relative % 65 %   Lymphocytes Relative 26 %   Monocytes Relative 8 %   Eosinophils Relative 0 %   Basophils Relative 1 %   Neutro Abs 8.9 (H) 1.7 - 7.7 K/uL   Lymphs Abs 3.5 0.7 - 4.0 K/uL   Monocytes Absolute 1.1 (H) 0.1 - 1.0 K/uL   Eosinophils Absolute 0.0 0.0 - 0.7 K/uL   Basophils Absolute 0.1 0.0 -  0.1 K/uL  Comprehensive metabolic panel     Status: Abnormal   Collection Time: 05/09/16  8:45 PM  Result Value Ref Range   Sodium 131 (L) 135 - 145 mmol/L   Potassium 3.3 (L) 3.5 - 5.1 mmol/L   Chloride 91 (L) 101 - 111 mmol/L   CO2 27 22 - 32 mmol/L   Glucose, Bld 119 (H) 65 - 99 mg/dL   BUN 6 6 - 20 mg/dL   Creatinine, Ser 1.03 (H) 0.44 - 1.00 mg/dL   Calcium 8.3 (L) 8.9 - 10.3 mg/dL   Total Protein 7.4 6.5 - 8.1 g/dL   Albumin 2.4 (L) 3.5 - 5.0 g/dL   AST 27 15 - 41 U/L   ALT 13 (L) 14 - 54 U/L   Alkaline Phosphatase 59 38 - 126 U/L   Total Bilirubin 1.1 0.3 - 1.2 mg/dL   GFR calc non Af Amer >60 >60 mL/min   GFR calc Af Amer >60 >60 mL/min    Comment: (NOTE) The eGFR has been calculated using the CKD EPI equation. This calculation has not been validated in all clinical situations. eGFR's persistently <60 mL/min signify possible Chronic Kidney Disease.    Anion gap 13 5 - 15  Lipase, blood     Status: None   Collection Time: 05/09/16  8:45 PM  Result Value Ref Range   Lipase 13 11 - 51 U/L  Blood culture (routine x 2)     Status: None (Preliminary result)   Collection Time: 05/09/16  8:45 PM  Result Value Ref Range   Specimen Description BLOOD LEFT ANTECUBITAL    Special Requests BOTTLES DRAWN AEROBIC AND ANAEROBIC 5CC     Culture NO GROWTH < 24 HOURS    Report Status PENDING   I-stat troponin, ED     Status: None   Collection Time: 05/09/16  8:52 PM  Result Value Ref Range   Troponin i, poc 0.00 0.00 - 0.08 ng/mL   Comment 3            Comment: Due to the release kinetics  of cTnI, a negative result within the first hours of the onset of symptoms does not rule out myocardial infarction with certainty. If myocardial infarction is still suspected, repeat the test at appropriate intervals.   I-Stat CG4 Lactic Acid, ED     Status: Abnormal   Collection Time: 05/09/16  8:53 PM  Result Value Ref Range   Lactic Acid, Venous 2.40 (HH) 0.5 - 2.0 mmol/L   Comment NOTIFIED PHYSICIAN   CBG monitoring, ED     Status: Abnormal   Collection Time: 05/09/16  9:26 PM  Result Value Ref Range   Glucose-Capillary 115 (H) 65 - 99 mg/dL  Glucose, capillary     Status: Abnormal   Collection Time: 05/10/16  1:45 AM  Result Value Ref Range   Glucose-Capillary 168 (H) 65 - 99 mg/dL  Cortisol     Status: None   Collection Time: 05/10/16  2:00 AM  Result Value Ref Range   Cortisol, Plasma 9.1 ug/dL    Comment: (NOTE) AM    6.7 - 22.6 ug/dL PM   <10.0       ug/dL   Lactic acid, plasma     Status: None   Collection Time: 05/10/16  2:00 AM  Result Value Ref Range   Lactic Acid, Venous 1.0 0.5 - 2.0 mmol/L  Procalcitonin     Status: None   Collection Time: 05/10/16  2:00 AM  Result Value Ref Range   Procalcitonin 0.62 ng/mL    Comment:        Interpretation: PCT > 0.5 ng/mL and <= 2 ng/mL: Systemic infection (sepsis) is possible, but other conditions are known to elevate PCT as well. (NOTE)         ICU PCT Algorithm               Non ICU PCT Algorithm    ----------------------------     ------------------------------         PCT < 0.25 ng/mL                 PCT < 0.1 ng/mL     Stopping of antibiotics            Stopping of antibiotics       strongly encouraged.               strongly encouraged.    ----------------------------     ------------------------------       PCT level decrease by               PCT < 0.25 ng/mL       >= 80% from peak PCT       OR PCT 0.25 - 0.5 ng/mL          Stopping of antibiotics                                             encouraged.      Stopping of antibiotics           encouraged.    ----------------------------     ------------------------------       PCT level decrease by              PCT >= 0.25 ng/mL       < 80% from peak PCT        AND PCT >= 0.5 ng/mL             Continuing antibiotics                                              encouraged.       Continuing antibiotics            encouraged.    ----------------------------     ------------------------------     PCT level increase compared          PCT > 0.5 ng/mL         with peak PCT AND          PCT >= 0.5 ng/mL             Escalation of antibiotics                                          strongly encouraged.      Escalation of antibiotics        strongly encouraged.   Lactic acid, plasma     Status: None   Collection Time: 05/10/16  5:58 AM  Result Value Ref Range   Lactic Acid, Venous 0.9 0.5 - 2.0 mmol/L  Magnesium     Status: Abnormal   Collection Time: 05/10/16  5:58 AM  Result Value Ref Range   Magnesium 1.3 (L) 1.7 - 2.4 mg/dL  Phosphorus     Status: None   Collection Time: 05/10/16  5:58 AM  Result Value Ref Range   Phosphorus 2.6 2.5 - 4.6 mg/dL  TSH     Status: None   Collection Time: 05/10/16  5:58 AM  Result Value Ref Range   TSH 0.483 0.350 - 4.500 uIU/mL  Comprehensive metabolic panel     Status: Abnormal   Collection Time: 05/10/16  5:58 AM  Result Value Ref Range   Sodium 132 (L) 135 - 145 mmol/L   Potassium 4.2 3.5 - 5.1 mmol/L    Comment: DELTA CHECK NOTED   Chloride 97 (L) 101 - 111 mmol/L   CO2 25 22 - 32 mmol/L   Glucose, Bld 260 (H) 65 - 99 mg/dL   BUN 8 6 - 20 mg/dL   Creatinine, Ser 0.85 0.44 - 1.00 mg/dL   Calcium 7.2 (L) 8.9 - 10.3 mg/dL   Total Protein 5.8 (L) 6.5 - 8.1 g/dL   Albumin 1.8 (L) 3.5 - 5.0 g/dL   AST 13 (L) 15 - 41 U/L   ALT 10 (L) 14 - 54 U/L   Alkaline Phosphatase 47 38 - 126 U/L   Total Bilirubin 1.1 0.3 - 1.2 mg/dL   GFR calc non Af Amer >60 >60 mL/min   GFR calc Af Amer >60 >60 mL/min     Comment: (NOTE) The eGFR has been calculated using the CKD EPI equation. This calculation has not been validated in all clinical situations. eGFR's persistently <60 mL/min signify possible Chronic Kidney Disease.    Anion gap 10 5 - 15  CBC     Status: Abnormal   Collection Time: 05/10/16  5:58 AM  Result Value Ref Range   WBC 9.7 4.0 - 10.5 K/uL   RBC 3.84 (L) 3.87 - 5.11 MIL/uL   Hemoglobin 10.7 (L) 12.0 - 15.0 g/dL   HCT 33.9 (L) 36.0 - 46.0 %   MCV 88.3 78.0 - 100.0 fL   MCH 27.9 26.0 - 34.0 pg   MCHC 31.6 30.0 - 36.0 g/dL   RDW 13.6 11.5 - 15.5 %   Platelets 233 150 - 400 K/uL  Protime-INR     Status: Abnormal   Collection Time: 05/10/16  6:20 AM  Result Value Ref Range   Prothrombin Time 16.5 (H) 11.6 - 15.2 seconds   INR 1.32 0.00 - 1.49  APTT     Status: None   Collection Time: 05/10/16  6:20 AM  Result Value Ref Range   aPTT 34 24 - 37 seconds  Ethanol     Status: None   Collection Time: 05/10/16  6:20 AM  Result Value Ref Range   Alcohol, Ethyl (B) <5 <5 mg/dL    Comment:        LOWEST DETECTABLE LIMIT FOR SERUM ALCOHOL IS 5 mg/dL FOR MEDICAL PURPOSES ONLY   Urinalysis, Routine w reflex microscopic (not at [facility])     Status: Abnormal   Collection Time: 05/10/16  6:39 AM  Result Value Ref Range   Color, Urine YELLOW YELLOW   APPearance CLOUDY (A) CLEAR   Specific Gravity, Urine 1.010 1.005 - 1.030   pH 6.0 5.0 - 8.0   Glucose, UA 100 (A) NEGATIVE mg/dL   Hgb urine dipstick TRACE (A) NEGATIVE   Bilirubin Urine NEGATIVE NEGATIVE  Ketones, ur 15 (A) NEGATIVE mg/dL   Protein, ur NEGATIVE NEGATIVE mg/dL   Nitrite NEGATIVE NEGATIVE   Leukocytes, UA NEGATIVE NEGATIVE  Urine rapid drug screen (hosp performed)     Status: Abnormal   Collection Time: 05/10/16  6:39 AM  Result Value Ref Range   Opiates POSITIVE (A) NONE DETECTED   Cocaine NONE DETECTED NONE DETECTED   Benzodiazepines NONE DETECTED NONE DETECTED   Amphetamines NONE DETECTED NONE DETECTED    Tetrahydrocannabinol NONE DETECTED NONE DETECTED   Barbiturates NONE DETECTED NONE DETECTED    Comment:        DRUG SCREEN FOR MEDICAL PURPOSES ONLY.  IF CONFIRMATION IS NEEDED FOR ANY PURPOSE, NOTIFY LAB WITHIN 5 DAYS.        LOWEST DETECTABLE LIMITS FOR URINE DRUG SCREEN Drug Class       Cutoff (ng/mL) Amphetamine      1000 Barbiturate      200 Benzodiazepine   161 Tricyclics       096 Opiates          300 Cocaine          300 THC              50   Urine microscopic-add on     Status: Abnormal   Collection Time: 05/10/16  6:39 AM  Result Value Ref Range   Squamous Epithelial / LPF 6-30 (A) NONE SEEN   WBC, UA 0-5 0 - 5 WBC/hpf   RBC / HPF 0-5 0 - 5 RBC/hpf   Bacteria, UA RARE (A) NONE SEEN  Glucose, capillary     Status: Abnormal   Collection Time: 05/10/16  8:03 AM  Result Value Ref Range   Glucose-Capillary 300 (H) 65 - 99 mg/dL  Glucose, capillary     Status: Abnormal   Collection Time: 05/10/16 12:18 PM  Result Value Ref Range   Glucose-Capillary 479 (H) 65 - 99 mg/dL    Dg Chest 2 View  05/09/2016  CLINICAL DATA:  Pain. EXAM: CHEST  2 VIEW COMPARISON:  May 07, 2016 FINDINGS: Mild bibasilar atelectasis. Linear opacity in the lingula is probably atelectatic as well. No pneumothorax. No other interval changes or acute abnormalities. A rounded density in the left hilum is consistent with a vessel on end. No nodule was seen in this location on the recent CT scan. IMPRESSION: Increasing atelectasis in the lingula with persistent atelectasis in the bases. Electronically Signed   By: Dorise Bullion III M.D   On: 05/09/2016 22:07   Dg Tibia/fibula Right  05/09/2016  CLINICAL DATA:  Pain without reported trauma. EXAM: RIGHT TIBIA AND FIBULA - 2 VIEW COMPARISON:  None. FINDINGS: Soft tissue edema. Vascular calcifications are seen. No fracture or dislocation. No bony erosion or evidence of osteomyelitis IMPRESSION: Soft tissue edema.  No underlying bony abnormality identified.  Electronically Signed   By: Dorise Bullion III M.D   On: 05/09/2016 22:09   Dg Foot Complete Right  05/09/2016  CLINICAL DATA:  45 year old presenting with 2 day history of edema and erythema involving the right lower leg and right foot. Current history of diabetes. EXAM: RIGHT FOOT COMPLETE - 3+ VIEW COMPARISON:  04/29/2016. FINDINGS: No evidence of acute, subacute or healed fractures. Well preserved joint spaces. Well preserved bone mineral density. Small plantar calcaneal spur. Calcification involving the dorsalis pedis artery and the anterior and posterior tibial arteries. IMPRESSION: No acute or significant abnormalities. Electronically Signed   By: Evangeline Dakin M.D.   On:  05/09/2016 22:12    Review of Systems  Constitutional: Positive for fever.  HENT: Negative.   Eyes: Negative.   Respiratory: Positive for shortness of breath (occasional).   Cardiovascular: Negative.   Gastrointestinal: Negative.   Genitourinary: Negative.   Musculoskeletal: Negative.   Skin: Positive for rash.  Neurological: Negative.   Endo/Heme/Allergies: Negative.   Psychiatric/Behavioral: Positive for substance abuse.  All other systems reviewed and are negative.  Blood pressure 94/64, pulse 91, temperature 98.4 F (36.9 C), temperature source Oral, resp. rate 28, height 5' 4"  (1.626 m), weight 102 kg (224 lb 13.9 oz), last menstrual period 04/19/2016, SpO2 93 %. Physical Exam  Constitutional: She is oriented to person, place, and time. She appears well-developed and well-nourished. No distress.  HENT:  Head: Normocephalic and atraumatic.  Eyes: Conjunctivae are normal. Pupils are equal, round, and reactive to light. No scleral icterus.  Neck: Neck supple.  Cardiovascular: Normal rate and regular rhythm.   Respiratory: Effort normal. No respiratory distress.  GI: She exhibits no distension.  Neurological: She is alert and oriented to person, place, and time. Coordination normal.  Skin: Skin is warm  and dry. No rash noted. She is not diaphoretic. There is erythema (right lower extremity erythema and small blisters on anterior surface.  Does wrap around to back.  ). No pallor.  Psychiatric: She has a normal mood and affect. Her behavior is normal. Judgment and thought content normal.    Assessment/Plan: Right lower extremity cellulitis. Does not appear to be a necrotizing soft tissue infection or abscess.  Not fluctuant.   Also, physiologically, she is stable.  If it starts to rapidly progress, would get CT or MR of right lower leg to evaluate for deep infection that needs debridement or drainage.   For now, would continue on antibiotics as you are doing.    Roan Sawchuk 05/10/2016, 12:59 PM

## 2016-05-10 NOTE — Consult Note (Signed)
PULMONARY / CRITICAL CARE MEDICINE   Name: Carla Hicks MRN: [id number] DOB: March 14, 1971    ADMISSION DATE:  05/09/2016 CONSULTATION DATE:  5/20  REFERRING MD:  Dr. Roel Cluck [facility]  CHIEF COMPLAINT:  R leg erythema  HISTORY OF PRESENT ILLNESS:   45 year old female with past medical history as below, which includes diabetes, EtOH abuse, recently diagnosed back otitis, diastolic congestive heart failure, and alpha-1 antitrypsin deficiency. She also has a history of asthma for which she used to be on home O2. She is still on daily prednisone 20 mg which is in the process of being slowly tapered down from 160 mg daily per patient report. She is a current every day smoker and has about a 10-pack-year history. She has a history of alcohol abuse and was drinking about 12 beers per day but has not had a drink for over a month(since recent pancreatitis diagnosis). She denies any illegal drug use.   She has been admitted twice already this month. The first time was 5/2 for which she was found to have pancreatitis with lipase elevated at over 2000. She was treated with IV fluid resuscitation and treatment for hypertriglyceridemia and was discharged home. She was again admitted 5/10 with diabetic ketoacidosis. She essentially left the hospital [place] during that admission but was technically discharged so she can receive prescriptions and medications. She presented to the emergency department again 5/18 with complaints of shortness of breath. CT angiogram of the chest was obtained and was negative for pulmonary embolism, however he did identify pancreatitis. She responded well to bronchodilators and was discharged home.  5/20 she again presented to [facility] emergency department complaining of right lower extremity erythema and edema adjacent to site of known right lower shotty ulcer. New from her presentation 2 days ago are fever, chills, weakness causing her to fall on the day of presentation.  She does not report any head trauma during the fall. In the emergency department she was noted be febrile, hypotensive, with leukocytosis and elevated lactic acid level at 2.4. She was given 2 L of IVF resuscitation without improvement of her blood pressure. She is being admitted to the stepdown unit under the hospitalist. PCCM has been asked to see in consultation.   PAST MEDICAL HISTORY :  She  has a past medical history of Diabetes mellitus without complication ([place]) and Asthma.  OSA on CPAP.  H/O pancreatitis. (-) CA, DVT  PAST SURGICAL HISTORY: She  has past surgical history that includes Cesarean section.  Allergies  Allergen Reactions  . Penicillins Hives    Has patient had a PCN reaction causing immediate rash, facial/tongue/throat swelling, SOB or lightheadedness with hypotension: yes Has patient had a PCN reaction causing severe rash involving mucus membranes or skin necrosis: no Has patient had a PCN reaction that required hospitalization no Has patient had a PCN reaction occurring within the last 10 years: no If all of the above answers are "NO", then may proceed with Cephalosporin use.   . Toole [Menthol] Hives    No current facility-administered medications on file prior to encounter.   Current Outpatient Prescriptions on File Prior to Encounter  Medication Sig  . albuterol (PROVENTIL) (5 MG/ML) 0.5% nebulizer solution Take 0.5 mLs (2.5 mg total) by nebulization every 4 (four) hours as needed for wheezing or shortness of breath. Sob (Patient taking differently: Take 2.5 mg by nebulization every 4 (four) hours. Sob)  . atorvastatin (LIPITOR) 20 MG tablet Take 1 tablet (20  mg total) by mouth daily at 6 PM.  . blood glucose meter kit and supplies KIT Dispense based on patient and insurance preference. Use up to four times daily as directed. (FOR ICD-9 250.00, 250.01).  . budesonide-formoterol (SYMBICORT) 80-4.5 MCG/ACT inhaler Take 2 puffs first thing in am  and then another 2 puffs about 12 hours later.  . cloNIDine (CATAPRES) 0.1 MG tablet Take 1 tablet (0.1 mg total) by mouth 2 (two) times daily.  . diclofenac sodium (VOLTAREN) 1 % GEL Apply 1 application topically 4 (four) times daily. (Patient taking differently: Apply 1 application topically 4 (four) times daily as needed (pain.). )  . gabapentin (NEURONTIN) 300 MG capsule Take 1 capsule (300 mg total) by mouth 2 (two) times daily.  Marland Kitchen gemfibrozil (LOPID) 600 MG tablet Take 1 tablet (600 mg total) by mouth 2 (two) times daily before a meal.  . glucose blood test strip Use as instructed  . ibuprofen (ADVIL) 200 MG tablet Take 1-2 tablets (200-400 mg total) by mouth every 6 (six) hours as needed for mild pain or moderate pain.  Marland Kitchen insulin detemir (LEVEMIR) 100 UNIT/ML injection Inject 0.18 mLs (18 Units total) into the skin 2 (two) times daily. (Patient taking differently: Inject 22 Units into the skin 2 (two) times daily. )  . Insulin Pen Needle 29G X 13MM MISC 1 Device by Does not apply route 4 (four) times daily - after meals and at bedtime.  . INSULIN SYRINGE 1CC/29G 29G X 1/2" 1 ML MISC 1 Syringe by Does not apply route 4 (four) times daily - after meals and at bedtime.  . Lancets MISC 1 Stick by Does not apply route 4 (four) times daily -  with meals and at bedtime.  . Multiple Vitamin (MULTIVITAMIN WITH MINERALS) TABS tablet Take 1 tablet by mouth daily.  Marland Kitchen oxyCODONE (ROXICODONE) 5 MG immediate release tablet Take 1 tablet (5 mg total) by mouth every 4 (four) hours as needed for severe pain.  . pantoprazole (PROTONIX) 40 MG tablet Take 1 tablet (40 mg total) by mouth daily. Take 30-60 min before first meal of the day  . potassium chloride SA (K-DUR,KLOR-CON) 20 MEQ tablet Take 1 tablet (20 mEq total) by mouth daily.  . predniSONE (DELTASONE) 20 MG tablet 2 tabs po daily x 4 days (Patient taking differently: Take 40 mg by mouth daily with breakfast. 2 tabs po daily x 4 days)  . Respiratory  Therapy Supplies (FLUTTER) DEVI Use as directed    FAMILY HISTORY:  Her indicated that her mother is alive. She indicated that her father is alive.   SOCIAL HISTORY: She  reports that she quit smoking about 4 years ago. Her smoking use included Cigarettes. She has a 37.5 pack-year smoking history. She has never used smokeless tobacco. She reports that she drinks alcohol. She reports that she does not use illicit drugs.  REVIEW OF SYSTEMS:   Bolds are positive  Constitutional: weight loss, gain, night sweats, Fevers, chills, fatigue .  HEENT: headaches, Sore throat, sneezing, nasal congestion, post nasal drip, Difficulty swallowing, Tooth/dental problems, visual complaints visual changes, ear ache CV:  chest pain, radiates: ,Orthopnea, PND, swelling in lower extremities, dizziness, palpitations, syncope.  GI  heartburn, indigestion, RUQ abdominal pain, nausea, vomiting, diarrhea, change in bowel habits, loss of appetite, bloody stools.  Resp: cough, productive: , hemoptysis, dyspnea, chest pain, pleuritic.  Skin: rash or itching or icterus GU: dysuria, change in color of urine, urgency or frequency. flank pain, hematuria  MS:  joint pain or swelling. decreased range of motion  Psych: change in mood or affect. depression or anxiety.  Neuro: difficulty with speech, weakness, numbness, ataxia   SUBJECTIVE:    VITAL SIGNS: BP 85/56 mmHg  Pulse 95  Temp(Src) 100.6 F (38.1 C) (Oral)  Resp 16  Wt 106 kg (233 lb 11 oz)  SpO2 90%  LMP 04/19/2016  HEMODYNAMICS:    VENTILATOR SETTINGS:    INTAKE / OUTPUT:    PHYSICAL EXAMINATION: General:  Morbidly obese female in NAD Neuro:  Alert, oriented, non-focal HEENT:  [place]/AT, PERRL, no appreciable JVD Cardiovascular:  RRR, no MRG Lungs:  Faint wheeze L lung Abdomen:  Soft, non-distended. RUQ tenderness Musculoskeletal:  No acute deformity or ROM limitation Skin:  Erythema and edema to R calf. (+) tenderness  LABS:  BMET  Recent  Labs Lab 05/07/16 1205 05/09/16 2045  NA 137 131*  K 2.7* 3.3*  CL 97* 91*  CO2 27 27  BUN <5* 6  CREATININE 0.71 1.03*  GLUCOSE 316* 119*    Electrolytes  Recent Labs Lab 05/07/16 1205 05/09/16 2045  CALCIUM 8.7* 8.3*    CBC  Recent Labs Lab 05/07/16 1205 05/09/16 2045  WBC 9.4 13.6*  HGB 11.5* 13.0  HCT 34.8* 39.6  PLT 299 295    Coag's No results for input(s): APTT, INR in the last 168 hours.  Sepsis Markers  Recent Labs Lab 05/09/16 2053  LATICACIDVEN 2.40*    ABG No results for input(s): PHART, PCO2ART, PO2ART in the last 168 hours.  Liver Enzymes  Recent Labs Lab 05/09/16 2045  AST 27  ALT 13*  ALKPHOS 59  BILITOT 1.1  ALBUMIN 2.4*    Cardiac Enzymes No results for input(s): TROPONINI, PROBNP in the last 168 hours.  Glucose  Recent Labs Lab 05/09/16 2126  GLUCAP 115*    Imaging Dg Chest 2 View  05/09/2016  CLINICAL DATA:  Pain. EXAM: CHEST  2 VIEW COMPARISON:  May 07, 2016 FINDINGS: Mild bibasilar atelectasis. Linear opacity in the lingula is probably atelectatic as well. No pneumothorax. No other interval changes or acute abnormalities. A rounded density in the left hilum is consistent with a vessel on end. No nodule was seen in this location on the recent CT scan. IMPRESSION: Increasing atelectasis in the lingula with persistent atelectasis in the bases. Electronically Signed   By: Dorise Bullion III M.D   On: 05/09/2016 22:07   Dg Tibia/fibula Right  05/09/2016  CLINICAL DATA:  Pain without reported trauma. EXAM: RIGHT TIBIA AND FIBULA - 2 VIEW COMPARISON:  None. FINDINGS: Soft tissue edema. Vascular calcifications are seen. No fracture or dislocation. No bony erosion or evidence of osteomyelitis IMPRESSION: Soft tissue edema.  No underlying bony abnormality identified. Electronically Signed   By: Dorise Bullion III M.D   On: 05/09/2016 22:09   Dg Foot Complete Right  05/09/2016  CLINICAL DATA:  45 year old presenting with 2  day history of edema and erythema involving the right lower leg and right foot. Current history of diabetes. EXAM: RIGHT FOOT COMPLETE - 3+ VIEW COMPARISON:  04/29/2016. FINDINGS: No evidence of acute, subacute or healed fractures. Well preserved joint spaces. Well preserved bone mineral density. Small plantar calcaneal spur. Calcification involving the dorsalis pedis artery and the anterior and posterior tibial arteries. IMPRESSION: No acute or significant abnormalities. Electronically Signed   By: Evangeline Dakin M.D.   On: 05/09/2016 22:12     STUDIES:  CTA chest 5/18 > Enlarged pancreas suggests pancreatic  edema / inflammation. Recommend clinical correlation for acute pancreatitis. No evidence of acute pulmonary embolism. Examination of the smaller arteries is degraded by respiratory motion. Xray RLE 5/20 > Soft tissue edema.  CULTURES: BCx2 5/20 >  Urine 5/20 >  ANTIBIOTICS: Cefepime 5/20 >  Vancomycin 5/20 > Flagyl 5/20 >  SIGNIFICANT EVENTS: 5/2 admit for acute pancreatitis 5/10 admit for DKA 5/20 admit for RLE cellulitis  LINES/TUBES:    ASSESSMENT / PLAN:  Hypotension related to severe sepsis vs hypovolemia  2/2 Cellulitis RLE Elevated lactic acid QTC prolongation H/o HTN -Telemetry monitoring on SDU -MAP goal > 59mHg -Stress dose steroids -Will give 1 additional liter NS to meet 30cc/kg goal, but I think she could safely have more than this. Will re-assess -Repeat lactic acid after 30 cc/kg -Assess cortisol and then stress dose steroids -May need ICU/CVL/Pressors, but should be able to avoid. -suggest surgery evaluation   Severe sepsis secondary to cellulitis RLE -ABX as above -PCT -Follow cultures - May need surgery evaln.   H/o Asthma +/- acute exacerbation Tobacco Abuse -Supplemental O2 PRN. Titrate to keep SpO2 90-95% -Scheduled and PRN nebulized bronchodilators -Steroids as above (on stress dose) -Neb meds -Aggressive incentive  spirometry -Smoking cessation counseling > she quit 1 week ago  AKI -hydrate -Follow BMP  Acute pancreatitis  -Per primary  DM  - Per primary   FAMILY  - [facility]  - Inter-disciplinary family meet or Palliative Care meeting due by:  5/27   PGeorgann Housekeeper AGACNP-BC LFayettePulmonology/Critical Care Pager [phone] [phone] 05/10/2016 12:40 AM  ATTENDING NOTE / ATTESTATION NOTE :   I have discussed the case with the resident/APP PGeorgann Housekeeper  I agree with the resident/APP's  history, physical examination, assessment, and plans.  I have edited the above note and modified it according to our agreed history, physical examination, assessment and plan.   Briefly, pt with repeated admissions recently for various reasone, known DM and OSA, comes in with 2 day h/o of R leg erythema/cellulitis/tenderness assoc with lactic acidosis, fever, elevated WBC, hypotension. Denies trauma. No bite marks seen. Volume resuscitated already with 4L and BP was 110/70.  Also with acute exacerbation of asthma. Also with recent pancreatitis. Pt is comfortable. Has wheezing bilaterally. R leg is erythematous/swollen/tender.  Cont broad spectrum abx, IVF. Cont clear diet.  Steroids and neb meds for asthma. Bipap at HS for OSA.  Told RN to watch the RLE if it will worsen dramatically the next 6-12 hrs >> told staff to call MD since she may need surgery evaln.   Family :No family at bedside. I discussed the case with the pt.   JMonica Becton MD 05/10/2016, 7:32 AM [place] [facility] Pager [phone] After 3 pm or if no answer, call [phone]

## 2016-05-10 NOTE — ED Notes (Signed)
Attempted report. Left call back number.

## 2016-05-10 NOTE — Progress Notes (Signed)
PULMONARY / CRITICAL CARE MEDICINE   Name: Carla Hicks MRN: [id number] DOB: 03-08-1971    ADMISSION DATE:  05/09/2016 CONSULTATION DATE:  5/20  REFERRING MD:  Dr. Adela Glimpse TRH  CHIEF COMPLAINT:  R leg erythema  HISTORY OF PRESENT ILLNESS:   45 year old female with past medical history as below, which includes diabetes, EtOH abuse, recently diagnosed back otitis, diastolic congestive heart failure, and alpha-1 antitrypsin deficiency. She also has a history of asthma for which she used to be on home O2. She is still on daily prednisone 20 mg which is in the process of being slowly tapered down from 160 mg daily per patient report. She is a current every day smoker and has about a 10-pack-year history. She has a history of alcohol abuse and was drinking about 12 beers per day but has not had a drink for over a month(since recent pancreatitis diagnosis). She denies any illegal drug use.   She has been admitted twice already this month. The first time was 5/2 for which she was found to have pancreatitis with lipase elevated at over 2000. She was treated with IV fluid resuscitation and treatment for hypertriglyceridemia and was discharged home. She was again admitted 5/10 with diabetic ketoacidosis. She essentially left the hospital AGAINST MEDICAL ADVICE during that admission but was technically discharged so she can receive prescriptions and medications. She presented to the emergency department again 5/18 with complaints of shortness of breath. CT angiogram of the chest was obtained and was negative for pulmonary embolism, however he did identify pancreatitis. She responded well to bronchodilators and was discharged home.  5/20 she again presented to [facility] emergency department complaining of right lower extremity erythema and edema adjacent to site of known right lower shotty ulcer. New from her presentation 2 days ago are fever, chills, weakness causing her to fall on the day of presentation.  She does not report any head trauma during the fall. In the emergency department she was noted be febrile, hypotensive, with leukocytosis and elevated lactic acid level at 2.4. She was given 2 L of IVF resuscitation without improvement of her blood pressure. She is being admitted to the stepdown unit under the hospitalist. PCCM has been asked to see in consultation.    SUBJECTIVE:  "I'm feeling a lot better". Denies SOB or wheeze now at rest. Last smoked 4 days ago.   VITAL SIGNS: BP 109/69 mmHg  Pulse 88  Temp(Src) 98.9 F (37.2 C) (Oral)  Resp 28  Ht 5\' 4"  (1.626 m)  Wt 102 kg (224 lb 13.9 oz)  BMI 38.58 kg/m2  SpO2 94%  LMP 04/19/2016  HEMODYNAMICS:    VENTILATOR SETTINGS:    INTAKE / OUTPUT: I/O last 3 completed shifts: In: 3040 [P.O.:240; I.V.:1000; IV Piggyback:1800] Out: 650 [Urine:650]  PHYSICAL EXAMINATION: General:  Morbidly obese female in NAD, talking easily on phone Neuro:  Alert, oriented, non-focal, cooperative HEENT:  [place]/AT, PERRL, no appreciable JVD Cardiovascular:  RRR, no MRG Lungs:  Clear, no cough Abdomen:  Soft, non-distended. RUQ tenderness Musculoskeletal:  No acute deformity or ROM limitation Skin:  Erythema and edema to R calf. (+) tenderness  LABS:  BMET  Recent Labs Lab 05/07/16 1205 05/09/16 2045 05/10/16 0558  NA 137 131* 132*  K 2.7* 3.3* 4.2  CL 97* 91* 97*  CO2 27 27 25   BUN <5* 6 8  CREATININE 0.71 1.03* 0.85  GLUCOSE 316* 119* 260*    Electrolytes  Recent Labs Lab 05/07/16 1205 05/09/16 2045  05/10/16 0558  CALCIUM 8.7* 8.3* 7.2*  MG  --   --  1.3*  PHOS  --   --  2.6    CBC  Recent Labs Lab 05/07/16 1205 05/09/16 2045 05/10/16 0558  WBC 9.4 13.6* 9.7  HGB 11.5* 13.0 10.7*  HCT 34.8* 39.6 33.9*  PLT 299 295 233    Coag's  Recent Labs Lab 05/10/16 0620  APTT 34  INR 1.32    Sepsis Markers  Recent Labs Lab 05/09/16 2053 05/10/16 0200 05/10/16 0558  LATICACIDVEN 2.40* 1.0 0.9   PROCALCITON  --  0.62  --     ABG No results for input(s): PHART, PCO2ART, PO2ART in the last 168 hours.  Liver Enzymes  Recent Labs Lab 05/09/16 2045 05/10/16 0558  AST 27 13*  ALT 13* 10*  ALKPHOS 59 47  BILITOT 1.1 1.1  ALBUMIN 2.4* 1.8*    Cardiac Enzymes No results for input(s): TROPONINI, PROBNP in the last 168 hours.  Glucose  Recent Labs Lab 05/09/16 2126 05/10/16 0145  GLUCAP 115* 168*    Imaging Dg Chest 2 View  05/09/2016  CLINICAL DATA:  Pain. EXAM: CHEST  2 VIEW COMPARISON:  May 07, 2016 FINDINGS: Mild bibasilar atelectasis. Linear opacity in the lingula is probably atelectatic as well. No pneumothorax. No other interval changes or acute abnormalities. A rounded density in the left hilum is consistent with a vessel on end. No nodule was seen in this location on the recent CT scan. IMPRESSION: Increasing atelectasis in the lingula with persistent atelectasis in the bases. Electronically Signed   By: David  Williams III M.D   On: 05/09/2016 22:07   Dg Tibia/fibula Right  05/09/2016  CLINICAL DATA:  Pain without reported trauma. EXAM: RIGHT TIBIA AND FIBULA - 2 VIEW COMPARISON:  None. FINDINGS: Soft tissue edema. Vascular calcifications are seen. No fracture or dislocation. No bony erosion or evidence of osteomyelitis IMPRESSION: Soft tissue edema.  No underlying bony abnormality identified. Electronically Signed   By: David  Williams III M.D   On: 05/09/2016 22:09   Dg Foot Complete Right  05/09/2016  CLINICAL DATA:  45 year old presenting with 2 day history of edema and erythema involving the right lower leg and right foot. Current history of diabetes. EXAM: RIGHT FOOT COMPLETE - 3+ VIEW COMPARISON:  04/29/2016. FINDINGS: No evidence of acute, subacute or healed fractures. Well preserved joint spaces. Well preserved bone mineral density. Small plantar calcaneal spur. Calcification involving the dorsalis pedis artery and the anterior and posterior tibial arteries.  IMPRESSION: No acute or significant abnormalities. Electronically Signed   By: Thomas  Lawrence M.D.   On: 05/09/2016 22:12     STUDIES:  CTA chest 5/18 > Enlarged pancreas suggests pancreatic edema / inflammation. Recommend clinical correlation for acute pancreatitis. No evidence of acute pulmonary embolism. Examination of the smaller arteries is degraded by respiratory motion. Xray RLE 5/20 > Soft tissue edema.  CULTURES: BCx2 5/20 >  Urine 5/20 >  ANTIBIOTICS: Cefepime 5/20 >  Vancomycin 5/20 > Flagyl 5/20 >  SIGNIFICANT EVENTS: 5/2 admit for acute pancreatitis 5/10 admit for DKA 5/20 admit for RLE cellulitis  LINES/TUBES:    ASSESSMENT / PLAN:  Hypotension related to severe sepsis vs hypovolemia  2/2 Cellulitis RLE Elevated lactic acid QTC prolongation H/o HTN -Telemetry monitoring on SDU -MAP goal > Carmin Mu<MEASUREMENTSundaKoJohnson County Health CenDepoo HospKoreaiKentF rrCarmin Mu<MEASUREMENTSundaKoSkyline HospiFranklin County Medical CeKoreanKentF rrCarmin Mu<MEASUREMENTSundaKoBienville Medical CenGood Samaritan HospKoreaiKentF rrCarmin Mu<MEASUREMENTSundKoMemorial Hermann Katy HospiMoncrief Army Community HospKoreaiKentF rrCarmin Mu SundaKoConnecticut Orthopaedic Specialists [facility] HospKoreaiKentF rrCarmin Mu<MEASUREMENTSundaKoSurgcenter Of Southern MarylEssentia Health SandsKoreatKentF rrCarmin Mu<MEASUREMENTSundaKoO'Connor HospiChi St. Vincent Infirmary Health SyKoreasKentF rrCarmin Mu SundaKoRehabilitation Hospital Of Southern New MexSalem Endoscopy CenterKorea KentF rrCarmin Mu<MEASUREMENTSundaKoMerritt Island Outpatient Surgery CenHeart And Vascular Surgical CenterKorea KentF rrCarmin Mu<MEASUREMENTSundaKoLa Veta Surgical CenFort Lauderdale HospKoreaiKentF rrCarmin Mu<MEASUREMENTSundaKoSaint Lukes Surgicenter Lees SumSilver Oaks Behavorial HospKoreaiKentF rrCarmin Mu<MEASUREMESundaKoThe Outpatient Center Of DelPlaza Ambulatory Surgery CenterKorea KentF rrCarmin Mu SundaKoVibra Hospital Of Fort WaNorthwest Ohio Psychiatric HospKoreaiKentF73arrel Connerstyy steroids- can taper off as BP stabilizes -Ok to let her control fluids po and watch I&O, BP -Repeat lactic acid after 30 cc/kg -Assess cortisol  and then stress dose steroids -May need ICU/CVL/Pressors, but should be able to avoid. -suggest surgery evaluation   Severe sepsis secondary to cellulitis RLE -ABX as above -PCT -Follow cultures - May need surgery evaln.   H/o Asthma +/- acute exacerbation Tobacco Abuse -Supplemental O2 PRN. Titrate to keep SpO2 90-95% -Scheduled and PRN nebulized bronchodilators -Steroids as above (on stress dose) -Neb meds -Aggressive incentive spirometry -Smoking cessation counseling > she quit 4 days ago  AKI -hydrate -Follow BMP  Acute pancreatitis  -Per primary  DM  - Per primary     05/10/2016 8:44 AM  ATTENDING NOTE / ATTESTATION NOTE :   I have discussed the case with the resident/APP Joneen Roach.  I agree with the resident/APP's  history, physical examination, assessment, and plans.  I have edited the above note and modified it according to our agreed history, physical examination,  assessment and plan.   Briefly, pt with repeated admissions recently for various reasone, known DM and OSA, comes in with 2 day h/o of R leg erythema/cellulitis/tenderness assoc with lactic acidosis, fever, elevated WBC, hypotension. Denies trauma. No bite marks seen. Volume resuscitated already with 4L and BP was 110/70.  Also with acute exacerbation of asthma. Also with recent pancreatitis. Pt is comfortable. Has wheezing bilaterally. R leg is erythematous/swollen/tender.  Cont broad spectrum abx, IVF. Cont clear diet.  Steroids and neb meds for asthma. Bipap at HS for OSA.  Told RN to watch the RLE if it will worsen dramatically the next 6-12 hrs >> told staff to call MD since she may need surgery evaln.   Family :No family at bedside. I discussed the case with the pt.    05/10/2016, 8:44 AM [place] [facility] Pager [phone] After 3 pm or if no answer, call [phone]

## 2016-05-10 NOTE — Progress Notes (Signed)
Pharmacy Antibiotic Note  Carla Hicks is a 45 y.o. female admitted on 05/09/2016 with cellulitis.  Pharmacy has been consulted for vancomycin and cefepime dosing. Tmax is 102.5 and WBC is elevated at 13.6. SCr has bumped slightly from a couple of days ago. Will change cefepime to q12 today  Plan: - Vanc  IV x 1 then  IV Q12H - Change Cefepime 2g IV q12 - F/u renal fxn, C&S, clinical status and trough at SS  Height:  (162.6 cm) Weight: 224 lb 13.9 oz (102 kg) IBW/kg (Calculated) : 54.7  Temp (24hrs), Avg:100.3 F (37.9 C), Min:98.9 F (37.2 C), Max:102.5 F (39.2 C)   Recent Labs Lab 05/07/16 1205 05/09/16 2045 05/09/16 2053 05/10/16 0200 05/10/16 0558  WBC 9.4 13.6*  --   --  9.7  CREATININE 0.71 1.03*  --   --  0.85  LATICACIDVEN  --   --  2.40* 1.0 0.9    Estimated Creatinine Clearance: 98.1 mL/min (by C-G formula based on Cr of 0.85).    Allergies  Allergen Reactions  . Penicillins Hives    Has patient had a PCN reaction causing immediate rash, facial/tongue/throat swelling, SOB or lightheadedness with hypotension: yes Has patient had a PCN reaction causing severe rash involving mucus membranes or skin necrosis: no Has patient had a PCN reaction that required hospitalization no Has patient had a PCN reaction occurring within the last 10 years: no If all of the above answers are "NO", then may proceed with Cephalosporin use.   . Robitussin Liquid Center [Menthol] Hives    Antimicrobials this admission: Vanc 5/20>> Cefepime 5/20>>  Dose adjustments thiCHARMAIN DIOSDADON/A  Microbiology results: Pending  Thank you for allowing pharmacy to be a part of this patient's care.  Carla Hicks [facility] 05/10/2016 7:37 AM

## 2016-05-11 ENCOUNTER — Inpatient Hospital Stay (HOSPITAL_COMMUNITY): Payer: Self-pay

## 2016-05-11 ENCOUNTER — Encounter (HOSPITAL_COMMUNITY): Payer: Self-pay | Admitting: Radiology

## 2016-05-11 DIAGNOSIS — J4520 Mild intermittent asthma, uncomplicated: Secondary | ICD-10-CM

## 2016-05-11 DIAGNOSIS — K860 Alcohol-induced chronic pancreatitis: Secondary | ICD-10-CM

## 2016-05-11 DIAGNOSIS — F1010 Alcohol abuse, uncomplicated: Secondary | ICD-10-CM

## 2016-05-11 LAB — GLUCOSE, CAPILLARY
GLUCOSE-CAPILLARY: 106 mg/dL — AB (ref 65–99)
GLUCOSE-CAPILLARY: 110 mg/dL — AB (ref 65–99)
GLUCOSE-CAPILLARY: 143 mg/dL — AB (ref 65–99)
GLUCOSE-CAPILLARY: 149 mg/dL — AB (ref 65–99)
GLUCOSE-CAPILLARY: 155 mg/dL — AB (ref 65–99)
GLUCOSE-CAPILLARY: 208 mg/dL — AB (ref 65–99)
GLUCOSE-CAPILLARY: 253 mg/dL — AB (ref 65–99)
GLUCOSE-CAPILLARY: 277 mg/dL — AB (ref 65–99)
GLUCOSE-CAPILLARY: 91 mg/dL (ref 65–99)
Glucose-Capillary: 130 mg/dL — ABNORMAL HIGH (ref 65–99)
Glucose-Capillary: 95 mg/dL (ref 65–99)
Glucose-Capillary: 150 mg/dL — ABNORMAL HIGH (ref 65–99)
Glucose-Capillary: 157 mg/dL — ABNORMAL HIGH (ref 65–99)
Glucose-Capillary: 166 mg/dL — ABNORMAL HIGH (ref 65–99)
Glucose-Capillary: 283 mg/dL — ABNORMAL HIGH (ref 65–99)
Glucose-Capillary: 216 mg/dL — ABNORMAL HIGH (ref 65–99)
Glucose-Capillary: 124 mg/dL — ABNORMAL HIGH (ref 65–99)
Glucose-Capillary: 164 mg/dL — ABNORMAL HIGH (ref 65–99)
Glucose-Capillary: 143 mg/dL — ABNORMAL HIGH (ref 65–99)
Glucose-Capillary: 156 mg/dL — ABNORMAL HIGH (ref 65–99)

## 2016-05-11 LAB — CBC
HCT: 31.4 % — ABNORMAL LOW (ref 36.0–46.0)
HEMOGLOBIN: 10.2 g/dL — AB (ref 12.0–15.0)
MCH: 28.8 pg (ref 26.0–34.0)
MCHC: 32.5 g/dL (ref 30.0–36.0)
MCV: 88.7 fL (ref 78.0–100.0)
PLATELETS: 277 10*3/uL (ref 150–400)
RBC: 3.54 MIL/uL — AB (ref 3.87–5.11)
RDW: 13.4 % (ref 11.5–15.5)
WBC: 14.6 10*3/uL — AB (ref 4.0–10.5)

## 2016-05-11 LAB — BASIC METABOLIC PANEL
Anion gap: 7 (ref 5–15)
BUN: 10 mg/dL (ref 6–20)
CALCIUM: 7.5 mg/dL — AB (ref 8.9–10.3)
CO2: 25 mmol/L (ref 22–32)
CREATININE: 0.71 mg/dL (ref 0.44–1.00)
Chloride: 104 mmol/L (ref 101–111)
GFR calc Af Amer: >60 mL/min (ref >60)
GLUCOSE: 91 mg/dL (ref 65–99)
Potassium: 4 mmol/L (ref 3.5–5.1)
Sodium: 136 mmol/L (ref 135–145)

## 2016-05-11 LAB — HEMOGLOBIN A1C
HEMOGLOBIN A1C: 10 % — AB (ref 4.8–5.6)
Mean Plasma Glucose: 240 mg/dL

## 2016-05-11 MED ORDER — INSULIN DETEMIR 100 UNIT/ML ~~LOC~~ SOLN
22.0000 [IU] | Freq: Every day | SUBCUTANEOUS | Status: DC
  Administered 2016-05-11: 22 [IU] via SUBCUTANEOUS
  Filled 2016-05-11 (×2): qty 0.22

## 2016-05-11 MED ORDER — DEXTROSE-NACL 5-0.45 % IV SOLN
INTRAVENOUS | Status: DC
  Administered 2016-05-11 – 2016-05-12 (×2): via INTRAVENOUS

## 2016-05-11 MED ORDER — DEXTROSE 50 % IV SOLN: 25.0000 mL | INTRAVENOUS | Status: DC | PRN

## 2016-05-11 MED ORDER — INSULIN ASPART 100 UNIT/ML ~~LOC~~ SOLN: 0.0000 [IU] | Freq: Every day | SUBCUTANEOUS | Status: DC

## 2016-05-11 MED ORDER — SODIUM CHLORIDE 0.9 % IV SOLN
INTRAVENOUS | Status: DC
  Administered 2016-05-11: 09:00:00 via INTRAVENOUS

## 2016-05-11 MED ORDER — SODIUM CHLORIDE 0.9 % IV SOLN
INTRAVENOUS | Status: DC
  Filled 2016-05-11: qty 2.5

## 2016-05-11 MED ORDER — SODIUM CHLORIDE 0.9 % IV SOLN
INTRAVENOUS | Status: DC
  Administered 2016-05-12: 10:00:00 via INTRAVENOUS
  Administered 2016-05-13: 75 mL/h via INTRAVENOUS

## 2016-05-11 MED ORDER — DEXTROSE 5 % IV SOLN
1.0000 g | Freq: Three times a day (TID) | INTRAVENOUS | Status: DC
  Administered 2016-05-11 – 2016-05-12 (×3): 1 g via INTRAVENOUS
  Filled 2016-05-11 (×6): qty 1

## 2016-05-11 MED ORDER — INSULIN ASPART 100 UNIT/ML ~~LOC~~ SOLN: 0.0000 [IU] | Freq: Three times a day (TID) | SUBCUTANEOUS | Status: DC

## 2016-05-11 MED ORDER — INSULIN REGULAR BOLUS VIA INFUSION: 0.0000 [IU] | Freq: Three times a day (TID) | INTRAVENOUS | Status: DC

## 2016-05-11 MED ORDER — HYDROCORTISONE NA SUCCINATE PF 100 MG IJ SOLR
50.0000 mg | Freq: Four times a day (QID) | INTRAMUSCULAR | Status: DC
Start: 2016-05-11 — End: 2016-05-12
  Administered 2016-05-11 – 2016-05-12 (×4): 50 mg via INTRAVENOUS
  Filled 2016-05-11 (×4): qty 2

## 2016-05-11 NOTE — Progress Notes (Signed)
PROGRESS NOTE    Carla Hicks  ZOX:[id number] DOB: [birth year] DOA: 05/09/2016 PCP: Lora Paula, MD   Brief Narrative:  45 year old female with history of asthma, alcoholic pancreatitis, DKA admissions, diabetes, presented to the emergency room with complaints of right lower extremity pain and swelling. Patient treated for cellulitis. The CCM as well as general surgery consulted. Assessment & Plan   Severe Sepsis secondary to right lower extremity cellulitis -Upon admission, patient was febrile with tachycardia, leukocytosis, tachypnea -Patient was also mildly hypotensive with elevated lactic acid -Lactic acid level had normalized -Patient did have hypotension which is responding to IV fluids and solucortef -PCCM consulted -Will wean off of solucoref -Increase WBC, possibly due to steroids vs infection -Continue broad-spectrum antibiotics with vancomycin, cefepime, flagyl -Blood cultures pending -Gen. surgery consulted and appreciated, recommended continuing antibiotics, if cellulitis extends beyond the marked area, obtain CT or MRI -CT showed soft tissue inflammation, no abcess  Acute kidney injury -Resolved, likely secondary to sepsis -Creatinine 0.71  Diabetes mellitus, type II -Was placed on Levemir, insulin sliding scale CBG monitoring, however, discontinued as patient's blood sugars were uncontrolled, despite extra doses of insulin and resistant sliding scale -placed patient on insulin drip -Currently not in DKA -Continue to monitor closely   History of asthma -Supposedly quit smoking 4 days ago (prior to admission) -Does not appear to be in exacerbation at this time -Continue supplemental oxygen to maintain saturations above 90% -Continue nebulizers as needed -Patient currently on steroids for hypotension  Essential hypertension -Home medications held  Tobacco abuse -Patient counseled regarding smoking cessation  History of alcoholism and alcoholic  induced pancreatitis -Alcohol level currently unremarkable -Continue to monitor closely  QT prolongation -Avoid medications with QT prolonging effects -Continue to monitor telemetry  DVT Prophylaxis  Lovenox  Code Status: Full  Family Communication: None at bedside  Disposition Plan: Admitted, continue to monitor and stepdown  Consultants PCCM Gen. Surgery  Procedures  none  Antibiotics   Anti-infectives    Start     Dose/Rate Route Frequency Ordered Stop   05/10/16 1800  ceFEPIme (MAXIPIME) 2 g in dextrose 5 % 50 mL IVPB     2 g 100 mL/hr over 30 Minutes Intravenous Every 12 hours 05/10/16 0736     05/10/16 1100  vancomycin (VANCOCIN) IVPB 750 mg/150 ml premix     750 mg 150 mL/hr over 60 Minutes Intravenous Every 12 hours 05/09/16 2208     05/10/16 0800  ceFEPIme (MAXIPIME) 1 g in dextrose 5 % 50 mL IVPB  Status:  Discontinued     1 g 100 mL/hr over 30 Minutes Intravenous Every 8 hours 05/09/16 2208 05/10/16 0736   05/10/16 0200  metroNIDAZOLE (FLAGYL) IVPB 500 mg     500 mg 100 mL/hr over 60 Minutes Intravenous Every 8 hours 05/10/16 0101     05/09/16 2130  ceFEPIme (MAXIPIME) 2 g in dextrose 5 % 50 mL IVPB     2 g 100 mL/hr over 30 Minutes Intravenous  Once 05/09/16 2127 05/09/16 2253   05/09/16 2130  vancomycin (VANCOCIN) 2,000 mg in sodium chloride 0.9 % 500 mL IVPB     2,000 mg 250 mL/hr over 120 Minutes Intravenous  Once 05/09/16 2127 05/10/16 0157      Subjective:   Carla Hicks seen and examined today.   Continues to have pain in the right leg.  Feels swelling has decreased.  States redness had worsened yesterday, but this morning is better. Denies chest pain, shortness of  breath, abdominal pain, nausea or vomiting.  Objective:   Filed Vitals:   05/11/16 0725 05/11/16 0800 05/11/16 0834 05/11/16 1209  BP: 120/80 120/74  130/86  Pulse: 80 79  78  Temp: 97.7 F (36.5 C)   98.1 F (36.7 C)  TempSrc: Oral   Oral  Resp: 12 13  20   Height:        Weight:      SpO2: 97% 100% 97% 96%    Intake/Output Summary (Last 24 hours) at 05/11/16 1246 Last data filed at 05/11/16 0175  Gross per 24 hour  Intake 2389.72 ml  Output      0 ml  Net 2389.72 ml   Filed Weights   05/09/16 2122 05/10/16 0101  Weight: 106 kg (233 lb 11 oz) 102 kg (224 lb 13.9 oz)    Exam  General: Well developed, well nourished, NAD  HEENT: NCAT,  mucous membranes moist.   Cardiovascular: S1 S2 auscultated, RRR, no murmurs  Respiratory: Clear to auscultation bilaterally  Abdomen: Soft, obese, nontender, nondistended, + bowel sounds  Extremities: warm dry without cyanosis clubbing. Erythema RLE- improving   Neuro: AAOx3, nonfocal  Skin: RLE erythema, edema, blistering- area marked   Psych: Normal affect and demeanor   Data Reviewed: I have personally reviewed following labs and imaging studies  CBC:  Recent Labs Lab 05/07/16 1205 05/09/16 2045 05/10/16 0558 05/11/16 0435  WBC 9.4 13.6* 9.7 14.6*  NEUTROABS 6.7 8.9*  --   --   HGB 11.5* 13.0 10.7* 10.2*  HCT 34.8* 39.6 33.9* 31.4*  MCV 87.9 89.4 88.3 88.7  PLT 299 295 233 277   Basic Metabolic Panel:  Recent Labs Lab 05/07/16 1205 05/09/16 2045 05/10/16 0558 05/10/16 1750 05/11/16 0435  NA 137 131* 132* 126* 136  K 2.7* 3.3* 4.2 4.3 4.0  CL 97* 91* 97* 97* 104  CO2 27 27 25  20* 25  GLUCOSE 316* 119* 260* 530* 91  BUN <5* 6 8 13 10   CREATININE 0.71 1.03* 0.85 1.04* 0.71  CALCIUM 8.7* 8.3* 7.2* 6.9* 7.5*  MG  --   --  1.3*  --   --   PHOS  --   --  2.6  --   --    GFR: Estimated Creatinine Clearance: 104.3 mL/min (by C-G formula based on Cr of 0.71). Liver Function Tests:  Recent Labs Lab 05/09/16 2045 05/10/16 0558  AST 27 13*  ALT 13* 10*  ALKPHOS 59 47  BILITOT 1.1 1.1  PROT 7.4 5.8*  ALBUMIN 2.4* 1.8*    Recent Labs Lab 05/09/16 2045  LIPASE 13   No results for input(s): AMMONIA in the last 168 hours. Coagulation Profile:  Recent Labs Lab  05/10/16 0620  INR 1.32   Cardiac Enzymes: No results for input(s): CKTOTAL, CKMB, CKMBINDEX, TROPONINI in the last 168 hours. BNP (last 3 results) No results for input(s): PROBNP in the last 8760 hours. HbA1C:  Recent Labs  05/10/16 0558  HGBA1C 10.0*   CBG:  Recent Labs Lab 05/11/16 0757 05/11/16 0856 05/11/16 1000 05/11/16 1104 05/11/16 1205  GLUCAP 157* 166* 283* 253* 216*   Lipid Profile: No results for input(s): CHOL, HDL, LDLCALC, TRIG, CHOLHDL, LDLDIRECT in the last 72 hours. Thyroid Function Tests:  Recent Labs  05/10/16 0558  TSH 0.483   Anemia Panel: No results for input(s): VITAMINB12, FOLATE, FERRITIN, TIBC, IRON, RETICCTPCT in the last 72 hours. Urine analysis:    Component Value Date/Time   COLORURINE YELLOW 05/10/2016 1025  APPEARANCEUR CLOUDY* 05/10/2016 0639   LABSPEC 1.010 05/10/2016 0639   PHURINE 6.0 05/10/2016 0639   GLUCOSEU 100* 05/10/2016 0639   HGBUR TRACE* 05/10/2016 0639   BILIRUBINUR NEGATIVE 05/10/2016 0639   BILIRUBINUR neg 09/18/2015 1131   KETONESUR 15* 05/10/2016 0639   PROTEINUR NEGATIVE 05/10/2016 0639   PROTEINUR 30 09/18/2015 1131   UROBILINOGEN 0.2 09/18/2015 1131   UROBILINOGEN 0.2 12/11/2014 1522   NITRITE NEGATIVE 05/10/2016 0639   NITRITE neg 09/18/2015 1131   LEUKOCYTESUR NEGATIVE 05/10/2016 0639   Sepsis Labs: @LABRCNTIP (procalcitonin:4,lacticidven:4)  ) Recent Results (from the past 240 hour(s))  Blood culture (routine x 2)     Status: None (Preliminary result)   Collection Time: 05/09/16  8:45 PM  Result Value Ref Range Status   Specimen Description BLOOD LEFT ANTECUBITAL  Final   Special Requests BOTTLES DRAWN AEROBIC AND ANAEROBIC 5CC   Final   Culture NO GROWTH 2 DAYS  Final   Report Status PENDING  Incomplete  Blood culture (routine x 2)     Status: None (Preliminary result)   Collection Time: 05/10/16  2:10 AM  Result Value Ref Range Status   Specimen Description BLOOD RIGHT ANTECUBITAL  Final    Special Requests BOTTLES DRAWN AEROBIC AND ANAEROBIC 5CC  Final   Culture NO GROWTH 1 DAY  Final   Report Status PENDING  Incomplete      Radiology Studies: Dg Chest 2 View  05/09/2016  CLINICAL DATA:  Pain. EXAM: CHEST  2 VIEW COMPARISON:  May 07, 2016 FINDINGS: Mild bibasilar atelectasis. Linear opacity in the lingula is probably atelectatic as well. No pneumothorax. No other interval changes or acute abnormalities. A rounded density in the left hilum is consistent with a vessel on end. No nodule was seen in this location on the recent CT scan. IMPRESSION: Increasing atelectasis in the lingula with persistent atelectasis in the bases. Electronically Signed   By: Gerome Sam III M.D   On: 05/09/2016 22:07   Dg Tibia/fibula Right  05/09/2016  CLINICAL DATA:  Pain without reported trauma. EXAM: RIGHT TIBIA AND FIBULA - 2 VIEW COMPARISON:  None. FINDINGS: Soft tissue edema. Vascular calcifications are seen. No fracture or dislocation. No bony erosion or evidence of osteomyelitis IMPRESSION: Soft tissue edema.  No underlying bony abnormality identified. Electronically Signed   By: Gerome Sam III M.D   On: 05/09/2016 22:09   Ct Femur Right W Contrast  05/11/2016  CLINICAL DATA:  Acute onset of worsening right lower extremity cellulitis, with swelling and erythema. Initial encounter. EXAM: CT OF THE LOWER RIGHT EXTREMITY WITH CONTRAST TECHNIQUE: Multidetector CT imaging of the right lower extremity was performed according to the standard protocol following intravenous contrast administration. COMPARISON:  Right tibia/fibula radiographs performed 05/09/2016 CONTRAST:  ISOVUE-300 IOPAMIDOL (ISOVUE-300) INJECTION 61% FINDINGS: Soft tissue inflammation is noted tracking along the distal right lower extremity, from the level of the mid calf inferiorly about the ankle. Associated skin thickening is seen. There is no evidence of abscess at this time. A small knee joint effusion is noted. The  right thigh is grossly unremarkable in appearance. Right inguinal nodes remain borderline normal in size. The visualized musculature is grossly unremarkable. Scattered calcification is noted along the superficial femoral artery, profunda femoris artery, popliteal artery and proximal branches of the popliteal artery. No acute osseous abnormalities are seen. The bladder is significantly distended. There is mild herniation of fat into the patient's pannus, anterior to the bladder. IMPRESSION: 1. Soft tissue inflammation tracking along  the distal right lower extremity, from the level of the mid calf inferiorly about the ankle. Associated skin thickening seen. No evidence of abscess. 2. Small knee joint effusion noted. 3. Scattered vascular calcifications seen. 4. Mild herniation of fat at the patient's pannus, anterior to the bladder. Electronically Signed   By: Roanna Raider M.D.   On: 05/11/2016 02:17   Ct Tibia Fibula Right W Contrast  05/11/2016  CLINICAL DATA:  Acute onset of worsening right lower extremity cellulitis, with swelling and erythema. Initial encounter. EXAM: CT OF THE LOWER RIGHT EXTREMITY WITH CONTRAST TECHNIQUE: Multidetector CT imaging of the right lower extremity was performed according to the standard protocol following intravenous contrast administration. COMPARISON:  Right tibia/fibula radiographs performed 05/09/2016 CONTRAST:  ISOVUE-300 IOPAMIDOL (ISOVUE-300) INJECTION 61% FINDINGS: Soft tissue inflammation is noted tracking along the distal right lower extremity, from the level of the mid calf inferiorly about the ankle. Associated skin thickening is seen. There is no evidence of abscess at this time. A small knee joint effusion is noted. The right thigh is grossly unremarkable in appearance. Right inguinal nodes remain borderline normal in size. The visualized musculature is grossly unremarkable. Scattered calcification is noted along the superficial femoral artery, profunda  femoris artery, popliteal artery and proximal branches of the popliteal artery. No acute osseous abnormalities are seen. The bladder is significantly distended. There is mild herniation of fat into the patient's pannus, anterior to the bladder. IMPRESSION: 1. Soft tissue inflammation tracking along the distal right lower extremity, from the level of the mid calf inferiorly about the ankle. Associated skin thickening seen. No evidence of abscess. 2. Small knee joint effusion noted. 3. Scattered vascular calcifications seen. 4. Mild herniation of fat at the patient's pannus, anterior to the bladder. Electronically Signed   By: Roanna Raider M.D.   On: 05/11/2016 02:17   Dg Foot Complete Right  05/09/2016  CLINICAL DATA:  45 year old presenting with 2 day history of edema and erythema involving the right lower leg and right foot. Current history of diabetes. EXAM: RIGHT FOOT COMPLETE - 3+ VIEW COMPARISON:  04/29/2016. FINDINGS: No evidence of acute, subacute or healed fractures. Well preserved joint spaces. Well preserved bone mineral density. Small plantar calcaneal spur. Calcification involving the dorsalis pedis artery and the anterior and posterior tibial arteries. IMPRESSION: No acute or significant abnormalities. Electronically Signed   By: Hulan Saas M.D.   On: 05/09/2016 22:12     Scheduled Meds: . atorvastatin  20 mg Oral q1800  . budesonide  0.5 mg Nebulization BID  . ceFEPime (MAXIPIME) IV  2 g Intravenous Q12H  . enoxaparin (LOVENOX) injection  50 mg Subcutaneous Q24H  . gabapentin  300 mg Oral BID  . guaiFENesin  600 mg Oral BID  . hydrocortisone sod succinate (SOLU-CORTEF) inj  50 mg Intravenous Q6H  . insulin regular  0-10 Units Intravenous TID WC  . ipratropium-albuterol  3 mL Nebulization BID  . metronidazole  500 mg Intravenous Q8H  . pantoprazole  40 mg Oral Daily  . potassium chloride SA  20 mEq Oral Daily  . sodium chloride flush  3 mL Intravenous Q12H  . vancomycin  750  mg Intravenous Q12H   Continuous Infusions: . sodium chloride    . sodium chloride    . insulin (NOVOLIN-R) infusion 7.5 Units/hr (05/11/16 1211)     LOS: 2 days   Time Spent in minutes   30 minutes  Kina Shiffman D.O. on 05/11/2016 at 12:46 PM  Between  7am to 7pm - Pager - [phone]  After 7pm go to [email or web address] - password [email or web address]  And look for the night coverage person covering for me after hours  [facility] Office  [phone]

## 2016-05-11 NOTE — Hospital Discharge Follow-Up (Signed)
The patient is known to the [facility] and was scheduled for an appointment with [facility] on 05/12/16. The appointment has been cancelled due to her hospitalization and will be re-scheduled when the discharge date is determined.   Will continue to follow her hospital course.

## 2016-05-11 NOTE — Care Management Note (Addendum)
Case Management Note  Patient Details  Name: MONCHEL POLLITT MRN: [id number] Date of Birth: 1971/11/09  Subjective/Objective:   Patient is from home with child, she was just recently here, now back with ETOH pancreatitis and RLE cellulitis.  Patient states she did get everything she needed from last admit and now she has cellulitis.  Patient states she has her cpap at home which is 79-45 years old, it is not putting out effective air and she thinks she needs a new one,  NCM informed her since she has had it for that long she may need a new sleep study, she asked if this can be done while she is here in the hospital , informed her no this will have to be done outpatient on a Sat or Sun, she states she will let me know tomorrow if she wants to do this because she will have to have someone watch her child for her.  NCM will cont to follow for dc needs.     5/23- Patient states she would like for NCM to set up sleep study for her so she can get another cpap machine because the one she has does not work well and she has had it for 15-16 years.  NCM paged MD to see if she would like patient to get sleep study, if so she would need to sign sleep study form.  Patient is being transferred to Med Surg.              Action/Plan:   Expected Discharge Date:                  Expected Discharge Plan:  Home/Self Care  In-House Referral:     Discharge planning Services  CM Consult  Post Acute Care Choice:    Choice offered to:     DME Arranged:    DME Agency:     HH Arranged:    HH Agency:     Status of Service:  In process, will continue to follow  Medicare Important Message Given:    Date Medicare IM Given:    Medicare IM give by:    Date Additional Medicare IM Given:    Additional Medicare Important Message give by:     If discussed at Long Length of Stay Meetings, dates discussed:    Additional Comments:  Leone Haven, RN 05/11/2016, 12:06 PM

## 2016-05-11 NOTE — Progress Notes (Signed)
Spoke with Dr. Catha Gosselin this morning and she does not want the insulin drip discontinued yet even though there are orders to do so. Dr. Catha Gosselin stated she would review the orders and make adjustments but to continue insulin drip for now.

## 2016-05-11 NOTE — Progress Notes (Signed)
PULMONARY / CRITICAL CARE MEDICINE   Name: Carla Hicks MRN: [id number] DOB: 1971/07/17    ADMISSION DATE:  05/09/2016 CONSULTATION DATE:  5/20  REFERRING MD:  Dr. Adela Glimpse [facility]  CHIEF COMPLAINT:  R leg erythema  HISTORY OF PRESENT ILLNESS:   45 year old female with past medical history as below, which includes diabetes, EtOH abuse, recently diagnosed back otitis, diastolic congestive heart failure, and alpha-1 antitrypsin deficiency. She also has a history of asthma for which she used to be on home O2. She is still on daily prednisone 20 mg which is in the process of being slowly tapered down from 160 mg daily per patient report. She is a current every day smoker and has about a 10-pack-year history. She has a history of alcohol abuse and was drinking about 12 beers per day but has not had a drink for over a month(since recent pancreatitis diagnosis). She denies any illegal drug use.   admitted sepsis, cellulitis   SUBJECTIVE:  "BP wnl   VITAL SIGNS: BP 130/86 mmHg  Pulse 78  Temp(Src) 98.1 F (36.7 C) (Oral)  Resp 20  Ht 5\' 4"  (1.626 m)  Wt 102 kg (224 lb 13.9 oz)  BMI 38.58 kg/m2  SpO2 96%  LMP 04/19/2016  HEMODYNAMICS:    VENTILATOR SETTINGS:    INTAKE / OUTPUT: I/O last 3 completed shifts: In: 5189.7 [P.O.:720; I.V.:2169.7; IV Piggyback:2300] Out: 650 [Urine:650]  PHYSICAL EXAMINATION: General:  Morbidly obese female in NAD, talking easily on phone and texting well Neuro:  Alert, oriented, non-focal, cooperative HEENT: jvd wnl Cardiovascular:  RRR, no MRG Lungs:  Clear Abdomen:  Soft, non-distended n o r/g , obese Musculoskeletal:  No acute deformity or ROM limitation Skin:  Erythema and edema to R calf. (+) tenderness, less edema, more wrinkles , less sounding erythema  LABS:  BMET  Recent Labs Lab 05/10/16 0558 05/10/16 1750 05/11/16 0435  NA 132* 126* 136  K 4.2 4.3 4.0  CL 97* 97* 104  CO2 25 20* 25  BUN 8 13 10   CREATININE 0.85 1.04* 0.71   GLUCOSE 260* 530* 91    Electrolytes  Recent Labs Lab 05/10/16 0558 05/10/16 1750 05/11/16 0435  CALCIUM 7.2* 6.9* 7.5*  MG 1.3*  --   --   PHOS 2.6  --   --     CBC  Recent Labs Lab 05/09/16 2045 05/10/16 0558 05/11/16 0435  WBC 13.6* 9.7 14.6*  HGB 13.0 10.7* 10.2*  HCT 39.6 33.9* 31.4*  PLT 295 233 277    Coag's  Recent Labs Lab 05/10/16 0620  APTT 34  INR 1.32    Sepsis Markers  Recent Labs Lab 05/09/16 2053 05/10/16 0200 05/10/16 0558  LATICACIDVEN 2.40* 1.0 0.9  PROCALCITON  --  0.62  --     ABG No results for input(s): PHART, PCO2ART, PO2ART in the last 168 hours.  Liver Enzymes  Recent Labs Lab 05/09/16 2045 05/10/16 0558  AST 27 13*  ALT 13* 10*  ALKPHOS 59 47  BILITOT 1.1 1.1  ALBUMIN 2.4* 1.8*    Cardiac Enzymes No results for input(s): TROPONINI, PROBNP in the last 168 hours.  Glucose  Recent Labs Lab 05/11/16 0757 05/11/16 0856 05/11/16 1000 05/11/16 1104 05/11/16 1205 05/11/16 1358  GLUCAP 157* 166* 283* 253* 216* 124*    Imaging Ct Femur Right W Contrast  05/11/2016  CLINICAL DATA:  Acute onset of worsening right lower extremity cellulitis, with swelling and erythema. Initial encounter. EXAM: CT OF THE LOWER  RIGHT EXTREMITY WITH CONTRAST TECHNIQUE: Multidetector CT imaging of the right lower extremity was performed according to the standard protocol following intravenous contrast administration. COMPARISON:  Right tibia/fibula radiographs performed 05/09/2016 CONTRAST:  ISOVUE-300 IOPAMIDOL (ISOVUE-300) INJECTION 61% FINDINGS: Soft tissue inflammation is noted tracking along the distal right lower extremity, from the level of the mid calf inferiorly about the ankle. Associated skin thickening is seen. There is no evidence of abscess at this time. A small knee joint effusion is noted. The right thigh is grossly unremarkable in appearance. Right inguinal nodes remain borderline normal in size. The visualized  musculature is grossly unremarkable. Scattered calcification is noted along the superficial femoral artery, profunda femoris artery, popliteal artery and proximal branches of the popliteal artery. No acute osseous abnormalities are seen. The bladder is significantly distended. There is mild herniation of fat into the patient's pannus, anterior to the bladder. IMPRESSION: 1. Soft tissue inflammation tracking along the distal right lower extremity, from the level of the mid calf inferiorly about the ankle. Associated skin thickening seen. No evidence of abscess. 2. Small knee joint effusion noted. 3. Scattered vascular calcifications seen. 4. Mild herniation of fat at the patient's pannus, anterior to the bladder. Electronically Signed   By: Roanna Raider M.D.   On: 05/11/2016 02:17   Ct Tibia Fibula Right W Contrast  05/11/2016  CLINICAL DATA:  Acute onset of worsening right lower extremity cellulitis, with swelling and erythema. Initial encounter. EXAM: CT OF THE LOWER RIGHT EXTREMITY WITH CONTRAST TECHNIQUE: Multidetector CT imaging of the right lower extremity was performed according to the standard protocol following intravenous contrast administration. COMPARISON:  Right tibia/fibula radiographs performed 05/09/2016 CONTRAST:  ISOVUE-300 IOPAMIDOL (ISOVUE-300) INJECTION 61% FINDINGS: Soft tissue inflammation is noted tracking along the distal right lower extremity, from the level of the mid calf inferiorly about the ankle. Associated skin thickening is seen. There is no evidence of abscess at this time. A small knee joint effusion is noted. The right thigh is grossly unremarkable in appearance. Right inguinal nodes remain borderline normal in size. The visualized musculature is grossly unremarkable. Scattered calcification is noted along the superficial femoral artery, profunda femoris artery, popliteal artery and proximal branches of the popliteal artery. No acute osseous abnormalities are seen. The  bladder is significantly distended. There is mild herniation of fat into the patient's pannus, anterior to the bladder. IMPRESSION: 1. Soft tissue inflammation tracking along the distal right lower extremity, from the level of the mid calf inferiorly about the ankle. Associated skin thickening seen. No evidence of abscess. 2. Small knee joint effusion noted. 3. Scattered vascular calcifications seen. 4. Mild herniation of fat at the patient's pannus, anterior to the bladder. Electronically Signed   By: Roanna Raider M.D.   On: 05/11/2016 02:17     STUDIES:  CTA chest 5/18 > Enlarged pancreas suggests pancreatic edema / inflammation. Recommend clinical correlation for acute pancreatitis. No evidence of acute pulmonary embolism. Examination of the smaller arteries is degraded by respiratory motion. Xray RLE 5/20 > Soft tissue edema.  CULTURES: BCx2 5/20 >  Urine 5/20 >  ANTIBIOTICS: Cefepime 5/20 >5/22 Vancomycin 5/20 >5/22 Flagyl 5/20 >5/22 Zosyn 5/22>>>  SIGNIFICANT EVENTS: 5/2 admit for acute pancreatitis 5/10 admit for DKA 5/20 admit for RLE cellulitis  LINES/TUBES:    ASSESSMENT / PLAN:  Hypotension related to severe sepsis vs hypovolemia  2/2 Cellulitis RLE Elevated lactic acid QTC prolongation H/o HTN -dc tele -lactic wnl, no repeat needed at this time -even  balance goals, go to [facility] once dka resolved  Severe sepsis secondary to cellulitis RLE clinically rle is improved edema and redness -ct re assuring without abscess -ccs evaluation noted -I think she has made some clinical progress -she has nosocomial exposure over last 90 days and is DM pt, would consider nosocomial course abx empiric as culture neg -consider transition to ceftazidime for completed course 10 days in total Dc vanc  H/o Asthma +/- acute exacerbation Tobacco Abuse -Supplemental O2 PRN. Titrate to keep SpO2 90-95% -Scheduled and PRN nebulized bronchodilators -Steroids as above (on stress dose),  can reduce now especially with sugars up -Neb meds -Aggressive incentive spirometry -Smoking cessation counseling > she quit 4 days ago   Will sign off, call if needed\  Mcarthur Rossetti. Tyson Alias, MD, FACP Pgr: [phone] [facility]

## 2016-05-11 NOTE — Progress Notes (Signed)
Pharmacy Antibiotic Note  Carla Hicks is a 45 y.o. female admitted on 05/09/2016 with wound infection.  Pharmacy has been consulted for ceftazidime dosing.  Plan: D/c vancomycin and cefepime  Start ceftazidime 1 gram q 8 hours Monitor clinical progress, c/s, renal function, abx plan/LOT   Height:  (162.6 cm) Weight: 224 lb 13.9 oz (102 kg) IBW/kg (Calculated) : 54.7  Temp (24hrs), Avg:97.9 F (36.6 C), Min:97.5 F (36.4 C), Max:98.3 F (36.8 C)   Recent Labs Lab 05/07/16 1205 05/09/16 2045 05/09/16 2053 05/10/16 0200 05/10/16 0558 05/10/16 1750 05/11/16 0435  WBC 9.4 13.6*  --   --  9.7  --  14.6*  CREATININE 0.71 1.03*  --   --  0.85 1.04* 0.71  LATICACIDVEN  --   --  2.40* 1.0 0.9  --   --     Estimated Creatinine Clearance: 104.3 mL/min (by C-G formula based on Cr of 0.71).    Allergies  Allergen Reactions  . Penicillins Hives    Has patient had a PCN reaction causing immediate rash, facial/tongue/throat swelling, SOB or lightheadedness with hypotension: yes Has patient had a PCN reaction causing severe rash involving mucus membranes or skin necrosis: no Has patient had a PCN reaction that required hospitalization no Has patient had a PCN reaction occurring within the last 10 years: no If all of the above answers are "NO", then may proceed with Cephalosporin use.   . Robitussin Liquid Center [Menthol] Hives    Antimicrobials this admission: Vanc 5/20>>5/22 Cefepime 5/20>>5/22 Flagyl 5/21>> Ceftazidime 5/22 >>   Dose adjustments this admission: n/a  Microbiology results: 5/20 BC x2: ng x 2d 5/21 BC x2: ng x 1d 5/21 MRSA PCR: sent   Thank you for allowing [place] to participate in this patients care. Signe Colt, PharmD Pager: [phone] 05/11/2016 2:33 PM

## 2016-05-11 NOTE — Progress Notes (Addendum)
Inpatient Diabetes Program Recommendations  AACE/ADA: New Consensus Statement on Inpatient Glycemic Control (2015)  Target Ranges:  Prepandial:   less than 140 mg/dL      Peak postprandial:   less than 180 mg/dL (1-2 hours)      Critically ill patients:  140 - 180 mg/dL   Results for Carla Hicks, Carla Hicks (MRN [id number]) as of 05/11/2016 13:25  Ref. Range 05/10/2016 05:58  Hemoglobin A1C Latest Ref Range: 4.8-5.6 % 10.0 (H)   Results for Carla Hicks, Carla Hicks (MRN [id number]) as of 05/11/2016 13:25  Ref. Range 05/10/2016 17:50  Sodium Latest Ref Range: 135-145 mmol/L 126 (L)  Potassium Latest Ref Range: 3.5-5.1 mmol/L 4.3  Chloride Latest Ref Range: 101-111 mmol/L 97 (L)  CO2 Latest Ref Range: 22-32 mmol/L 20 (L)  BUN Latest Ref Range: 6-20 mg/dL 13  Creatinine Latest Ref Range: 0.44-1.00 mg/dL 1.04 (H)  Calcium Latest Ref Range: 8.9-10.3 mg/dL 6.9 (L)  EGFR (Non-African Amer.) Latest Ref Range: >60 mL/min >60  EGFR (African American) Latest Ref Range: >60 mL/min >60  Glucose Latest Ref Range: 65-99 mg/dL 530 (H)  Anion gap Latest Ref Range: 5-15  9    Admit with: Sepsis/ LE Cellulitis  History: DM, COPD, ETOH, Pancreatitis  Home DM Meds: Levemir 22 units bid  Current Insulin Orders: IV Insulin drip       -Note patient started on IV insulin drip last night at 7:40 pm after having fingerstick glucose of 556 mg/dl at 5pm.  Not in DKA per MD notes.  -Patient currently receiving IV Solucortef 50 mg Q6 hours.  Patient also started on PO diet today.  Note RN covered patient's food consumption for lunch today with the IV insulin drip per IV insulin drip orders (note patient's breakfast not covered this AM and this is likely why patient's CBG jumped up to 283 mg/dl after breakfast today).  -Note patient was counseled about the importance of good blood sugar control at home during her last admission on 04/29/16.   MD- When you feel patient is ready to transition off the IV insulin drip,  please consider the following:  1. Start Levemir 22 units bid (home dose)- Give 1st dose and have RN continue IV insulin drip for 2 hours after Levemir started  2. Start Novolog Moderate Correction Scale/ SSI (0-15 units) TID AC + HS  3. Start Novolog Meal Coverage as well- Novolog 4 units tid with meals     --Will follow patient during hospitalization--  Wyn Quaker RN, MSN, CDE Diabetes Coordinator Inpatient Glycemic Control Team Team Pager: [phone] (8a-5p)

## 2016-05-11 NOTE — Progress Notes (Signed)
Subjective: Patient reports increased right lower leg pain  Objective: Vital signs in last 24 hours: Temp:  [97.5 F (36.4 C)-98.3 F (36.8 C)] 97.7 F (36.5 C) (05/22 0725) Pulse Rate:  [78-91] 80 (05/22 0725) Resp:  [12-24] 12 (05/22 0725) BP: (88-120)/(59-82) 120/80 mmHg (05/22 0725) SpO2:  [93 %-100 %] 97 % (05/22 0834) Last BM Date: 05/10/16  Intake/Output from previous day: 05/21 0701 - 05/22 0700 In: 2149.7 [P.O.:480; I.V.:1169.7; IV Piggyback:500] Out: -  Intake/Output this shift:    Erythema and tenderness right lower leg circumferentially. No gross abscess  Lab Results:   Recent Labs  05/10/16 0558 05/11/16 0435  WBC 9.7 14.6*  HGB 10.7* 10.2*  HCT 33.9* 31.4*  PLT 233 277   BMET  Recent Labs  05/10/16 1750 05/11/16 0435  NA 126* 136  K 4.3 4.0  CL 97* 104  CO2 20* 25  GLUCOSE 530* 91  BUN 13 10  CREATININE 1.04* 0.71  CALCIUM 6.9* 7.5*   PT/INR  Recent Labs  05/10/16 0620  LABPROT 16.5*  INR 1.32   ABG No results for input(s): PHART, HCO3 in the last 72 hours.  Invalid input(s): PCO2, PO2  Studies/Results: Dg Chest 2 View  05/09/2016  CLINICAL DATA:  Pain. EXAM: CHEST  2 VIEW COMPARISON:  May 07, 2016 FINDINGS: Mild bibasilar atelectasis. Linear opacity in the lingula is probably atelectatic as well. No pneumothorax. No other interval changes or acute abnormalities. A rounded density in the left hilum is consistent with a vessel on end. No nodule was seen in this location on the recent CT scan. IMPRESSION: Increasing atelectasis in the lingula with persistent atelectasis in the bases. Electronically Signed   By: Gerome Sam III M.D   On: 05/09/2016 22:07   Dg Tibia/fibula Right  05/09/2016  CLINICAL DATA:  Pain without reported trauma. EXAM: RIGHT TIBIA AND FIBULA - 2 VIEW COMPARISON:  None. FINDINGS: Soft tissue edema. Vascular calcifications are seen. No fracture or dislocation. No bony erosion or evidence of osteomyelitis  IMPRESSION: Soft tissue edema.  No underlying bony abnormality identified. Electronically Signed   By: Gerome Sam III M.D   On: 05/09/2016 22:09   Ct Femur Right W Contrast  05/11/2016  CLINICAL DATA:  Acute onset of worsening right lower extremity cellulitis, with swelling and erythema. Initial encounter. EXAM: CT OF THE LOWER RIGHT EXTREMITY WITH CONTRAST TECHNIQUE: Multidetector CT imaging of the right lower extremity was performed according to the standard protocol following intravenous contrast administration. COMPARISON:  Right tibia/fibula radiographs performed 05/09/2016 CONTRAST:  ISOVUE-300 IOPAMIDOL (ISOVUE-300) INJECTION 61% FINDINGS: Soft tissue inflammation is noted tracking along the distal right lower extremity, from the level of the mid calf inferiorly about the ankle. Associated skin thickening is seen. There is no evidence of abscess at this time. A small knee joint effusion is noted. The right thigh is grossly unremarkable in appearance. Right inguinal nodes remain borderline normal in size. The visualized musculature is grossly unremarkable. Scattered calcification is noted along the superficial femoral artery, profunda femoris artery, popliteal artery and proximal branches of the popliteal artery. No acute osseous abnormalities are seen. The bladder is significantly distended. There is mild herniation of fat into the patient's pannus, anterior to the bladder. IMPRESSION: 1. Soft tissue inflammation tracking along the distal right lower extremity, from the level of the mid calf inferiorly about the ankle. Associated skin thickening seen. No evidence of abscess. 2. Small knee joint effusion noted. 3. Scattered vascular calcifications seen. 4.  Mild herniation of fat at the patient's pannus, anterior to the bladder. Electronically Signed   By: Roanna Raider M.D.   On: 05/11/2016 02:17   Ct Tibia Fibula Right W Contrast  05/11/2016  CLINICAL DATA:  Acute onset of worsening right  lower extremity cellulitis, with swelling and erythema. Initial encounter. EXAM: CT OF THE LOWER RIGHT EXTREMITY WITH CONTRAST TECHNIQUE: Multidetector CT imaging of the right lower extremity was performed according to the standard protocol following intravenous contrast administration. COMPARISON:  Right tibia/fibula radiographs performed 05/09/2016 CONTRAST:  ISOVUE-300 IOPAMIDOL (ISOVUE-300) INJECTION 61% FINDINGS: Soft tissue inflammation is noted tracking along the distal right lower extremity, from the level of the mid calf inferiorly about the ankle. Associated skin thickening is seen. There is no evidence of abscess at this time. A small knee joint effusion is noted. The right thigh is grossly unremarkable in appearance. Right inguinal nodes remain borderline normal in size. The visualized musculature is grossly unremarkable. Scattered calcification is noted along the superficial femoral artery, profunda femoris artery, popliteal artery and proximal branches of the popliteal artery. No acute osseous abnormalities are seen. The bladder is significantly distended. There is mild herniation of fat into the patient's pannus, anterior to the bladder. IMPRESSION: 1. Soft tissue inflammation tracking along the distal right lower extremity, from the level of the mid calf inferiorly about the ankle. Associated skin thickening seen. No evidence of abscess. 2. Small knee joint effusion noted. 3. Scattered vascular calcifications seen. 4. Mild herniation of fat at the patient's pannus, anterior to the bladder. Electronically Signed   By: Roanna Raider M.D.   On: 05/11/2016 02:17   Dg Foot Complete Right  05/09/2016  CLINICAL DATA:  44 year old presenting with 2 day history of edema and erythema involving the right lower leg and right foot. Current history of diabetes. EXAM: RIGHT FOOT COMPLETE - 3+ VIEW COMPARISON:  04/29/2016. FINDINGS: No evidence of acute, subacute or healed fractures. Well preserved joint  spaces. Well preserved bone mineral density. Small plantar calcaneal spur. Calcification involving the dorsalis pedis artery and the anterior and posterior tibial arteries. IMPRESSION: No acute or significant abnormalities. Electronically Signed   By: Hulan Saas M.D.   On: 05/09/2016 22:12    Anti-infectives: Anti-infectives    Start     Dose/Rate Route Frequency Ordered Stop   05/10/16 1800  ceFEPIme (MAXIPIME) 2 g in dextrose 5 % 50 mL IVPB     2 g 100 mL/hr over 30 Minutes Intravenous Every 12 hours 05/10/16 0736     05/10/16 1100  vancomycin (VANCOCIN) IVPB 750 mg/150 ml premix     750 mg 150 mL/hr over 60 Minutes Intravenous Every 12 hours 05/09/16 2208     05/10/16 0800  ceFEPIme (MAXIPIME) 1 g in dextrose 5 % 50 mL IVPB  Status:  Discontinued     1 g 100 mL/hr over 30 Minutes Intravenous Every 8 hours 05/09/16 2208 05/10/16 0736   05/10/16 0200  metroNIDAZOLE (FLAGYL) IVPB 500 mg     500 mg 100 mL/hr over 60 Minutes Intravenous Every 8 hours 05/10/16 0101     05/09/16 2130  ceFEPIme (MAXIPIME) 2 g in dextrose 5 % 50 mL IVPB     2 g 100 mL/hr over 30 Minutes Intravenous  Once 05/09/16 2127 05/09/16 2253   05/09/16 2130  vancomycin (VANCOCIN) 2,000 mg in sodium chloride 0.9 % 500 mL IVPB     2,000 mg 250 mL/hr over 120 Minutes Intravenous  Once 05/09/16 2127  05/10/16 0157      Assessment/Plan:  Right lower extremity cellulitis  CT without abscess WBC increased. May benefit from Orthopedic evaluation for their input Continuing antibiotics  LOS: 2 days    Neveen Daponte A 05/11/2016

## 2016-05-11 NOTE — Clinical Social Work Note (Signed)
CSW acknowledges consult for financial difficulty getting medications. CSW will make RNCM aware.  CSW signing off.  Charlynn Court, CSW [phone]

## 2016-05-11 NOTE — Progress Notes (Signed)
Nutrition Brief Note  Patient identified on the Malnutrition Screening Tool (MST) Report. Patient with 13% weight loss since January. Suspect weight loss is related to uncontrolled DM with elevated glucoses PTA. Nutrition focused physical exam completed.  No muscle or subcutaneous fat depletion noticed. Patient reports that she has a good appetite now. She is unhappy with the hospital food and says she will be asking her family to bring in food for her. She has been drinking Boost Breeze supplements that were ordered per the oral nutrition supplement protocol. Discussed with patient and RN that patient does not need these supplements at this time. Will d/c Boost Breeze.   Wt Readings from Last 15 Encounters:  05/10/16 224 lb 13.9 oz (102 kg)  04/30/16 233 lb 11 oz (106 kg)  12/27/15 257 lb 12.8 oz (116.937 kg)  12/16/15 256 lb 2.8 oz (116.2 kg)  10/29/15 263 lb (119.296 kg)  09/18/15 262 lb (118.842 kg)  08/01/15 259 lb (117.482 kg)  08/01/15 256 lb (116.121 kg)  07/17/15 256 lb 6.4 oz (116.302 kg)  07/02/15 259 lb 3.2 oz (117.572 kg)  04/24/15 243 lb 12.8 oz (110.587 kg)  10/24/14 258 lb 9.6 oz (117.3 kg)  08/30/14 254 lb 6.6 oz (115.4 kg)  03/27/14 258 lb (117.028 kg)  11/07/13 267 lb (121.11 kg)    Body mass index is 38.58 kg/(m^2). Patient meets criteria for class 2 obesity based on current BMI.   Current diet order is CHO modified, patient is consuming approximately 100% of meals at this time. Labs and medications reviewed.   Diet education has been provided multiple times during recent hospital admissions. Suspect patient is noncompliant with diet at home.   No nutrition interventions warranted at this time. If nutrition issues arise, please consult RD.   Joaquin Courts, RD, LDN, CNSC Pager [phone] After Hours Pager [phone]

## 2016-05-12 ENCOUNTER — Inpatient Hospital Stay: Payer: Self-pay | Admitting: Family Medicine

## 2016-05-12 ENCOUNTER — Inpatient Hospital Stay (HOSPITAL_COMMUNITY): Payer: Self-pay

## 2016-05-12 ENCOUNTER — Telehealth: Payer: Self-pay

## 2016-05-12 LAB — GLUCOSE, CAPILLARY
GLUCOSE-CAPILLARY: 114 mg/dL — AB (ref 65–99)
GLUCOSE-CAPILLARY: 151 mg/dL — AB (ref 65–99)
GLUCOSE-CAPILLARY: 156 mg/dL — AB (ref 65–99)
GLUCOSE-CAPILLARY: 158 mg/dL — AB (ref 65–99)
GLUCOSE-CAPILLARY: 170 mg/dL — AB (ref 65–99)
GLUCOSE-CAPILLARY: 190 mg/dL — AB (ref 65–99)
GLUCOSE-CAPILLARY: 213 mg/dL — AB (ref 65–99)
GLUCOSE-CAPILLARY: 222 mg/dL — AB (ref 65–99)
GLUCOSE-CAPILLARY: 284 mg/dL — AB (ref 65–99)
Glucose-Capillary: 101 mg/dL — ABNORMAL HIGH (ref 65–99)
Glucose-Capillary: 109 mg/dL — ABNORMAL HIGH (ref 65–99)
Glucose-Capillary: 119 mg/dL — ABNORMAL HIGH (ref 65–99)
Glucose-Capillary: 175 mg/dL — ABNORMAL HIGH (ref 65–99)
Glucose-Capillary: 190 mg/dL — ABNORMAL HIGH (ref 65–99)

## 2016-05-12 LAB — IRON AND TIBC
IRON: 59 ug/dL (ref 28–170)
Saturation Ratios: 30 % (ref 10.4–31.8)
TIBC: 197 ug/dL — ABNORMAL LOW (ref 250–450)
UIBC: 138 ug/dL

## 2016-05-12 LAB — BASIC METABOLIC PANEL
Anion gap: 10 (ref 5–15)
BUN: 6 mg/dL (ref 6–20)
CALCIUM: 7.4 mg/dL — AB (ref 8.9–10.3)
CHLORIDE: 105 mmol/L (ref 101–111)
CO2: 21 mmol/L — AB (ref 22–32)
CREATININE: 0.53 mg/dL (ref 0.44–1.00)
GFR calc non Af Amer: >60 mL/min (ref >60)
GLUCOSE: 220 mg/dL — AB (ref 65–99)
Potassium: 3.7 mmol/L (ref 3.5–5.1)
Sodium: 136 mmol/L (ref 135–145)

## 2016-05-12 LAB — FERRITIN: FERRITIN: 263 ng/mL (ref 11–307)

## 2016-05-12 LAB — VITAMIN B12: Vitamin B-12: 808 pg/mL (ref 180–914)

## 2016-05-12 LAB — CBC
HEMATOCRIT: 29.4 % — AB (ref 36.0–46.0)
HEMOGLOBIN: 9.5 g/dL — AB (ref 12.0–15.0)
MCH: 28 pg (ref 26.0–34.0)
MCHC: 32.3 g/dL (ref 30.0–36.0)
MCV: 86.7 fL (ref 78.0–100.0)
Platelets: 297 10*3/uL (ref 150–400)
RBC: 3.39 MIL/uL — ABNORMAL LOW (ref 3.87–5.11)
RDW: 13.4 % (ref 11.5–15.5)
WBC: 12.4 10*3/uL — ABNORMAL HIGH (ref 4.0–10.5)

## 2016-05-12 LAB — FOLATE: FOLATE: 10.5 ng/mL (ref >5.9)

## 2016-05-12 LAB — RETICULOCYTES
RBC.: 3.4 MIL/uL — AB (ref 3.87–5.11)
RETIC COUNT ABSOLUTE: 34 10*3/uL (ref 19.0–186.0)
Retic Ct Pct: 1 % (ref 0.4–3.1)

## 2016-05-12 MED ORDER — INSULIN ASPART 100 UNIT/ML ~~LOC~~ SOLN
0.0000 [IU] | Freq: Three times a day (TID) | SUBCUTANEOUS | Status: DC
  Administered 2016-05-12 – 2016-05-13 (×3): 8 [IU] via SUBCUTANEOUS
  Administered 2016-05-13: 11 [IU] via SUBCUTANEOUS
  Administered 2016-05-14: 8 [IU] via SUBCUTANEOUS

## 2016-05-12 MED ORDER — INSULIN ASPART 100 UNIT/ML ~~LOC~~ SOLN
0.0000 [IU] | Freq: Every day | SUBCUTANEOUS | Status: DC
  Administered 2016-05-13: 3 [IU] via SUBCUTANEOUS

## 2016-05-12 MED ORDER — INSULIN DETEMIR 100 UNIT/ML ~~LOC~~ SOLN
22.0000 [IU] | Freq: Two times a day (BID) | SUBCUTANEOUS | Status: DC
  Administered 2016-05-12 – 2016-05-13 (×3): 22 [IU] via SUBCUTANEOUS
  Filled 2016-05-12 (×6): qty 0.22

## 2016-05-12 MED ORDER — CEFAZOLIN SODIUM 1-5 GM-% IV SOLN
1.0000 g | Freq: Three times a day (TID) | INTRAVENOUS | Status: DC
  Administered 2016-05-12 – 2016-05-14 (×6): 1 g via INTRAVENOUS
  Filled 2016-05-12 (×11): qty 50

## 2016-05-12 MED ORDER — INSULIN ASPART 100 UNIT/ML ~~LOC~~ SOLN
4.0000 [IU] | Freq: Three times a day (TID) | SUBCUTANEOUS | Status: DC
  Administered 2016-05-12 – 2016-05-13 (×2): 4 [IU] via SUBCUTANEOUS

## 2016-05-12 MED ORDER — HYDROCORTISONE NA SUCCINATE PF 100 MG IJ SOLR
50.0000 mg | Freq: Two times a day (BID) | INTRAMUSCULAR | Status: DC
  Administered 2016-05-12 – 2016-05-13 (×2): 50 mg via INTRAVENOUS
  Filled 2016-05-12 (×2): qty 2

## 2016-05-12 NOTE — Progress Notes (Signed)
Pt refused breathing treatment because she was on the phone. Patient also disconnected her telemetry and said she didn't want to be monitored but did let staff put the telemetry back on. Patient angry and stated, "I don't want to be here anymore and I don't want people to have to watch me in the bathroom. I am fine." It was explained to patient that since she had a fall at home prior to admission that she is a high fall risk and a staff member has to be with her while she walks anywhere or goes to the bathroom. Pt continues to be angry and stated, "I don't care."

## 2016-05-12 NOTE — Progress Notes (Signed)
Subjective: Much more comfortable today  Objective: Vital signs in last 24 hours: Temp:  [98.1 F (36.7 C)-98.6 F (37 C)] 98.6 F (37 C) (05/23 0325) Pulse Rate:  [73-84] 84 (05/23 0323) Resp:  [14-21] 20 (05/23 0323) BP: (107-148)/(63-102) 110/63 mmHg (05/23 0323) SpO2:  [96 %-100 %] 96 % (05/23 0323) Last BM Date: 05/11/16  Intake/Output from previous day: 05/22 0701 - 05/23 0700 In: 1917.5 [P.O.:360; I.V.:1307.5; IV Piggyback:250] Out: -  Intake/Output this shift:    Right lower leg much less tender and less edema Still with erythema  Lab Results:   Recent Labs  05/11/16 0435 05/12/16 0358  WBC 14.6* 12.4*  HGB 10.2* 9.5*  HCT 31.4* 29.4*  PLT 277 297   BMET  Recent Labs  05/11/16 0435 05/12/16 0358  NA 136 136  K 4.0 3.7  CL 104 105  CO2 25 21*  GLUCOSE 91 220*  BUN 10 6  CREATININE 0.71 0.53  CALCIUM 7.5* 7.4*   PT/INR  Recent Labs  05/10/16 0620  LABPROT 16.5*  INR 1.32   ABG No results for input(s): PHART, HCO3 in the last 72 hours.  Invalid input(s): PCO2, PO2  Studies/Results: Dg Chest 2 View  05/12/2016  CLINICAL DATA:  Wheezing and cough. EXAM: CHEST  2 VIEW COMPARISON:  05/09/2016 FINDINGS: Cardiomediastinal silhouette is normal. Mediastinal contours appear intact. There is no evidence of pleural effusion or pneumothorax. Lung volumes are low with streaky bibasilar airspace opacities, and generalized increased interstitial markings. Osseous structures are without acute abnormality. Soft tissues are grossly normal. IMPRESSION: Low lung volumes with increased interstitial markings. An element of interstitial pulmonary edema cannot be excluded with this appearance. Streaky bibasilar airspace opacities, most consistent with atelectasis. Electronically Signed   By: Ted Mcalpine M.D.   On: 05/12/2016 09:04   Ct Femur Right W Contrast  05/11/2016  CLINICAL DATA:  Acute onset of worsening right lower extremity cellulitis, with  swelling and erythema. Initial encounter. EXAM: CT OF THE LOWER RIGHT EXTREMITY WITH CONTRAST TECHNIQUE: Multidetector CT imaging of the right lower extremity was performed according to the standard protocol following intravenous contrast administration. COMPARISON:  Right tibia/fibula radiographs performed 05/09/2016 CONTRAST:  ISOVUE-300 IOPAMIDOL (ISOVUE-300) INJECTION 61% FINDINGS: Soft tissue inflammation is noted tracking along the distal right lower extremity, from the level of the mid calf inferiorly about the ankle. Associated skin thickening is seen. There is no evidence of abscess at this time. A small knee joint effusion is noted. The right thigh is grossly unremarkable in appearance. Right inguinal nodes remain borderline normal in size. The visualized musculature is grossly unremarkable. Scattered calcification is noted along the superficial femoral artery, profunda femoris artery, popliteal artery and proximal branches of the popliteal artery. No acute osseous abnormalities are seen. The bladder is significantly distended. There is mild herniation of fat into the patient's pannus, anterior to the bladder. IMPRESSION: 1. Soft tissue inflammation tracking along the distal right lower extremity, from the level of the mid calf inferiorly about the ankle. Associated skin thickening seen. No evidence of abscess. 2. Small knee joint effusion noted. 3. Scattered vascular calcifications seen. 4. Mild herniation of fat at the patient's pannus, anterior to the bladder. Electronically Signed   By: Roanna Raider M.D.   On: 05/11/2016 02:17   Ct Tibia Fibula Right W Contrast  05/11/2016  CLINICAL DATA:  Acute onset of worsening right lower extremity cellulitis, with swelling and erythema. Initial encounter. EXAM: CT OF THE LOWER RIGHT EXTREMITY WITH  CONTRAST TECHNIQUE: Multidetector CT imaging of the right lower extremity was performed according to the standard protocol following intravenous contrast  administration. COMPARISON:  Right tibia/fibula radiographs performed 05/09/2016 CONTRAST:  ISOVUE-300 IOPAMIDOL (ISOVUE-300) INJECTION 61% FINDINGS: Soft tissue inflammation is noted tracking along the distal right lower extremity, from the level of the mid calf inferiorly about the ankle. Associated skin thickening is seen. There is no evidence of abscess at this time. A small knee joint effusion is noted. The right thigh is grossly unremarkable in appearance. Right inguinal nodes remain borderline normal in size. The visualized musculature is grossly unremarkable. Scattered calcification is noted along the superficial femoral artery, profunda femoris artery, popliteal artery and proximal branches of the popliteal artery. No acute osseous abnormalities are seen. The bladder is significantly distended. There is mild herniation of fat into the patient's pannus, anterior to the bladder. IMPRESSION: 1. Soft tissue inflammation tracking along the distal right lower extremity, from the level of the mid calf inferiorly about the ankle. Associated skin thickening seen. No evidence of abscess. 2. Small knee joint effusion noted. 3. Scattered vascular calcifications seen. 4. Mild herniation of fat at the patient's pannus, anterior to the bladder. Electronically Signed   By: Roanna Raider M.D.   On: 05/11/2016 02:17    Anti-infectives: Anti-infectives    Start     Dose/Rate Route Frequency Ordered Stop   05/11/16 1600  cefTAZidime (FORTAZ) 1 g in dextrose 5 % 50 mL IVPB     1 g 100 mL/hr over 30 Minutes Intravenous Every 8 hours 05/11/16 1432     05/10/16 1800  ceFEPIme (MAXIPIME) 2 g in dextrose 5 % 50 mL IVPB  Status:  Discontinued     2 g 100 mL/hr over 30 Minutes Intravenous Every 12 hours 05/10/16 0736 05/11/16 1427   05/10/16 1100  vancomycin (VANCOCIN) IVPB 750 mg/150 ml premix  Status:  Discontinued     750 mg 150 mL/hr over 60 Minutes Intravenous Every 12 hours 05/09/16 2208 05/11/16 1427    05/10/16 0800  ceFEPIme (MAXIPIME) 1 g in dextrose 5 % 50 mL IVPB  Status:  Discontinued     1 g 100 mL/hr over 30 Minutes Intravenous Every 8 hours 05/09/16 2208 05/10/16 0736   05/10/16 0200  metroNIDAZOLE (FLAGYL) IVPB 500 mg     500 mg 100 mL/hr over 60 Minutes Intravenous Every 8 hours 05/10/16 0101 05/13/16 2359   05/09/16 2130  ceFEPIme (MAXIPIME) 2 g in dextrose 5 % 50 mL IVPB     2 g 100 mL/hr over 30 Minutes Intravenous  Once 05/09/16 2127 05/09/16 2253   05/09/16 2130  vancomycin (VANCOCIN) 2,000 mg in sodium chloride 0.9 % 500 mL IVPB     2,000 mg 250 mL/hr over 120 Minutes Intravenous  Once 05/09/16 2127 05/10/16 0157      Assessment/Plan: s/p * No surgery found *  Right lower leg cellulitis  Continues to improve.  WBC improved. No plans for surgery at this point.  Will sign off.   Call back if needed.  LOS: 3 days    Carla Hicks A 05/12/2016

## 2016-05-12 NOTE — Progress Notes (Addendum)
PROGRESS NOTE    Carla Hicks  NWG:[id number] DOB: [birth year] DOA: 05/09/2016 PCP: Lora Paula, MD   Brief Narrative:  45 year old female with history of asthma, alcoholic pancreatitis, DKA admissions, diabetes, presented to the emergency room with complaints of right lower extremity pain and swelling. Patient treated for cellulitis. The CCM as well as general surgery consulted.  Assessment & Plan   Severe Sepsis secondary to right lower extremity cellulitis -Upon admission, patient was febrile with tachycardia, leukocytosis, tachypnea -Patient was also mildly hypotensive with elevated lactic acid -Lactic acid level had normalized, leukocytosis trending downward -Patient did have hypotension which is responding to IV fluids and solucortef -PCCM consulted -Continue to wean solucoref -initially placed on vancomycin, cefepime, flagyl- transitioned to Ancef -Blood cultures show no growth to date -Gen. surgery consulted and appreciated, recommended continuing antibiotics, if cellulitis extends beyond the marked area, obtain CT or MRI -CT showed soft tissue inflammation, no abcess -Suspect blistering will worsen as cellulitis continues to be treated  Acute kidney injury -Resolved, likely secondary to sepsis -Creatinine 0.53  Diabetes mellitus, type II -Was placed on Levemir, insulin sliding scale CBG monitoring, however, discontinued as patient's blood sugars were uncontrolled, despite extra doses of insulin and resistant sliding scale -placed patient on insulin drip- will transition back to levemir 22u BID along with sliding scale today -Weaning off of solu-cortef -Currently not in DKA -Continue to monitor closely   History of asthma -Supposedly quit smoking 4 days ago (prior to admission) -Does not appear to be in exacerbation at this time -Continue supplemental oxygen to maintain saturations above 90% -Continue nebulizers as needed -Patient currently on steroids for  hypotension  Essential hypertension -Home medications held -BP improving   Tobacco abuse -Patient counseled regarding smoking cessation  History of alcoholism and alcoholic induced pancreatitis -Alcohol level currently unremarkable -Continue to monitor closely  QT prolongation -Avoid medications with QT prolonging effects -Continue to monitor telemetry  DVT Prophylaxis  Lovenox  Code Status: Full  Family Communication: None at bedside  Disposition Plan: Admitted, Transitioning off of insulin drip.  If sugars stay controlled, will transfer to medical floor   Consultants PCCM Gen. Surgery  Procedures  none  Antibiotics   Anti-infectives    Start     Dose/Rate Route Frequency Ordered Stop   05/12/16 1400  ceFAZolin (ANCEF) IVPB 1 g/50 mL premix     1 g 100 mL/hr over 30 Minutes Intravenous Every 8 hours 05/12/16 1308     05/11/16 1600  cefTAZidime (FORTAZ) 1 g in dextrose 5 % 50 mL IVPB  Status:  Discontinued     1 g 100 mL/hr over 30 Minutes Intravenous Every 8 hours 05/11/16 1432 05/12/16 1308   05/10/16 1800  ceFEPIme (MAXIPIME) 2 g in dextrose 5 % 50 mL IVPB  Status:  Discontinued     2 g 100 mL/hr over 30 Minutes Intravenous Every 12 hours 05/10/16 0736 05/11/16 1427   05/10/16 1100  vancomycin (VANCOCIN) IVPB 750 mg/150 ml premix  Status:  Discontinued     750 mg 150 mL/hr over 60 Minutes Intravenous Every 12 hours 05/09/16 2208 05/11/16 1427   05/10/16 0800  ceFEPIme (MAXIPIME) 1 g in dextrose 5 % 50 mL IVPB  Status:  Discontinued     1 g 100 mL/hr over 30 Minutes Intravenous Every 8 hours 05/09/16 2208 05/10/16 0736   05/10/16 0200  metroNIDAZOLE (FLAGYL) IVPB 500 mg  Status:  Discontinued     500 mg 100 mL/hr over 60  Minutes Intravenous Every 8 hours 05/10/16 0101 05/12/16 1308   05/09/16 2130  ceFEPIme (MAXIPIME) 2 g in dextrose 5 % 50 mL IVPB     2 g 100 mL/hr over 30 Minutes Intravenous  Once 05/09/16 2127 05/09/16 2253   05/09/16 2130  vancomycin  (VANCOCIN) 2,000 mg in sodium chloride 0.9 % 500 mL IVPB     2,000 mg 250 mL/hr over 120 Minutes Intravenous  Once 05/09/16 2127 05/10/16 0157      Subjective:   Carla Hicks seen and examined today.   Continues to have pain in the right leg.  Upset that she is stepdown unit as her visitors cannot come and go freely.  Upset that she has to be monitored.   Denies chest pain, shortness of breath, abdominal pain, nausea or vomiting.  Objective:   Filed Vitals:   05/12/16 0325 05/12/16 0723 05/12/16 0924 05/12/16 0927  BP:  136/77    Pulse:  80    Temp: 98.6 F (37 C) 98.6 F (37 C)    TempSrc: Oral Oral    Resp:  21    Height:      Weight:      SpO2:  97% 100% 100%    Intake/Output Summary (Last 24 hours) at 05/12/16 1310 Last data filed at 05/12/16 4650  Gross per 24 hour  Intake 1677.5 ml  Output      0 ml  Net 1677.5 ml   Filed Weights   05/09/16 2122 05/10/16 0101  Weight: 106 kg (233 lb 11 oz) 102 kg (224 lb 13.9 oz)    Exam  General: Well developed, well nourished, NAD  HEENT: NCAT,  mucous membranes moist.   Cardiovascular: S1 S2 auscultated, RRR, no murmurs  Respiratory: Clear to auscultation bilaterally  Abdomen: Soft, obese, nontender, nondistended, + bowel sounds  Extremities: warm dry without cyanosis clubbing. Erythema RLE- improving. Blistering worsening    Neuro: AAOx3, nonfocal  Skin: RLE erythema, edema, blistering- area marked   Psych: Normal affect and demeanor  Data Reviewed: I have personally reviewed following labs and imaging studies  CBC:  Recent Labs Lab 05/07/16 1205 05/09/16 2045 05/10/16 0558 05/11/16 0435 05/12/16 0358  WBC 9.4 13.6* 9.7 14.6* 12.4*  NEUTROABS 6.7 8.9*  --   --   --   HGB 11.5* 13.0 10.7* 10.2* 9.5*  HCT 34.8* 39.6 33.9* 31.4* 29.4*  MCV 87.9 89.4 88.3 88.7 86.7  PLT 299 295 233 277 297   Basic Metabolic Panel:  Recent Labs Lab 05/09/16 2045 05/10/16 0558 05/10/16 1750 05/11/16 0435  05/12/16 0358  NA 131* 132* 126* 136 136  K 3.3* 4.2 4.3 4.0 3.7  CL 91* 97* 97* 104 105  CO2 27 25 20* 25 21*  GLUCOSE 119* 260* 530* 91 220*  BUN 6 8 13 10 6   CREATININE 1.03* 0.85 1.04* 0.71 0.53  CALCIUM 8.3* 7.2* 6.9* 7.5* 7.4*  MG  --  1.3*  --   --   --   PHOS  --  2.6  --   --   --    GFR: Estimated Creatinine Clearance: 104.3 mL/min (by C-G formula based on Cr of 0.53). Liver Function Tests:  Recent Labs Lab 05/09/16 2045 05/10/16 0558  AST 27 13*  ALT 13* 10*  ALKPHOS 59 47  BILITOT 1.1 1.1  PROT 7.4 5.8*  ALBUMIN 2.4* 1.8*    Recent Labs Lab 05/09/16 2045  LIPASE 13   No results for input(s): AMMONIA in the  last 168 hours. Coagulation Profile:  Recent Labs Lab 05/10/16 0620  INR 1.32   Cardiac Enzymes: No results for input(s): CKTOTAL, CKMB, CKMBINDEX, TROPONINI in the last 168 hours. BNP (last 3 results) No results for input(s): PROBNP in the last 8760 hours. HbA1C:  Recent Labs  05/10/16 0558  HGBA1C 10.0*   CBG:  Recent Labs Lab 05/12/16 0625 05/12/16 0726 05/12/16 0828 05/12/16 0935 05/12/16 1048  GLUCAP 158* 114* 101* 109* 119*   Lipid Profile: No results for input(s): CHOL, HDL, LDLCALC, TRIG, CHOLHDL, LDLDIRECT in the last 72 hours. Thyroid Function Tests:  Recent Labs  05/10/16 0558  TSH 0.483   Anemia Panel:  Recent Labs  05/12/16 0716  VITAMINB12 808  FOLATE 10.5  FERRITIN 263  TIBC 197*  IRON 59  RETICCTPCT 1.0   Urine analysis:    Component Value Date/Time   COLORURINE YELLOW 05/10/2016 0639   APPEARANCEUR CLOUDY* 05/10/2016 0639   LABSPEC 1.010 05/10/2016 0639   PHURINE 6.0 05/10/2016 0639   GLUCOSEU 100* 05/10/2016 0639   HGBUR TRACE* 05/10/2016 0639   BILIRUBINUR NEGATIVE 05/10/2016 0639   BILIRUBINUR neg 09/18/2015 1131   KETONESUR 15* 05/10/2016 0639   PROTEINUR NEGATIVE 05/10/2016 0639   PROTEINUR 30 09/18/2015 1131   UROBILINOGEN 0.2 09/18/2015 1131   UROBILINOGEN 0.2 12/11/2014 1522    NITRITE NEGATIVE 05/10/2016 0639   NITRITE neg 09/18/2015 1131   LEUKOCYTESUR NEGATIVE 05/10/2016 0639   Sepsis Labs: @LABRCNTIP (procalcitonin:4,lacticidven:4)  ) Recent Results (from the past 240 hour(s))  Blood culture (routine x 2)     Status: None (Preliminary result)   Collection Time: 05/09/16  8:45 PM  Result Value Ref Range Status   Specimen Description BLOOD LEFT ANTECUBITAL  Final   Special Requests BOTTLES DRAWN AEROBIC AND ANAEROBIC 5CC   Final   Culture NO GROWTH 2 DAYS  Final   Report Status PENDING  Incomplete  Blood culture (routine x 2)     Status: None (Preliminary result)   Collection Time: 05/10/16  2:10 AM  Result Value Ref Range Status   Specimen Description BLOOD RIGHT ANTECUBITAL  Final   Special Requests BOTTLES DRAWN AEROBIC AND ANAEROBIC 5CC  Final   Culture NO GROWTH 1 DAY  Final   Report Status PENDING  Incomplete      Radiology Studies: Dg Chest 2 View  05/12/2016  CLINICAL DATA:  Wheezing and cough. EXAM: CHEST  2 VIEW COMPARISON:  05/09/2016 FINDINGS: Cardiomediastinal silhouette is normal. Mediastinal contours appear intact. There is no evidence of pleural effusion or pneumothorax. Lung volumes are low with streaky bibasilar airspace opacities, and generalized increased interstitial markings. Osseous structures are without acute abnormality. Soft tissues are grossly normal. IMPRESSION: Low lung volumes with increased interstitial markings. An element of interstitial pulmonary edema cannot be excluded with this appearance. Streaky bibasilar airspace opacities, most consistent with atelectasis. Electronically Signed   By: Ted Mcalpine M.D.   On: 05/12/2016 09:04   Ct Femur Right W Contrast  05/11/2016  CLINICAL DATA:  Acute onset of worsening right lower extremity cellulitis, with swelling and erythema. Initial encounter. EXAM: CT OF THE LOWER RIGHT EXTREMITY WITH CONTRAST TECHNIQUE: Multidetector CT imaging of the right lower extremity was  performed according to the standard protocol following intravenous contrast administration. COMPARISON:  Right tibia/fibula radiographs performed 05/09/2016 CONTRAST:  ISOVUE-300 IOPAMIDOL (ISOVUE-300) INJECTION 61% FINDINGS: Soft tissue inflammation is noted tracking along the distal right lower extremity, from the level of the mid calf inferiorly about the ankle. Associated  skin thickening is seen. There is no evidence of abscess at this time. A small knee joint effusion is noted. The right thigh is grossly unremarkable in appearance. Right inguinal nodes remain borderline normal in size. The visualized musculature is grossly unremarkable. Scattered calcification is noted along the superficial femoral artery, profunda femoris artery, popliteal artery and proximal branches of the popliteal artery. No acute osseous abnormalities are seen. The bladder is significantly distended. There is mild herniation of fat into the patient's pannus, anterior to the bladder. IMPRESSION: 1. Soft tissue inflammation tracking along the distal right lower extremity, from the level of the mid calf inferiorly about the ankle. Associated skin thickening seen. No evidence of abscess. 2. Small knee joint effusion noted. 3. Scattered vascular calcifications seen. 4. Mild herniation of fat at the patient's pannus, anterior to the bladder. Electronically Signed   By: Roanna Raider M.D.   On: 05/11/2016 02:17   Ct Tibia Fibula Right W Contrast  05/11/2016  CLINICAL DATA:  Acute onset of worsening right lower extremity cellulitis, with swelling and erythema. Initial encounter. EXAM: CT OF THE LOWER RIGHT EXTREMITY WITH CONTRAST TECHNIQUE: Multidetector CT imaging of the right lower extremity was performed according to the standard protocol following intravenous contrast administration. COMPARISON:  Right tibia/fibula radiographs performed 05/09/2016 CONTRAST:  ISOVUE-300 IOPAMIDOL (ISOVUE-300) INJECTION 61% FINDINGS: Soft tissue  inflammation is noted tracking along the distal right lower extremity, from the level of the mid calf inferiorly about the ankle. Associated skin thickening is seen. There is no evidence of abscess at this time. A small knee joint effusion is noted. The right thigh is grossly unremarkable in appearance. Right inguinal nodes remain borderline normal in size. The visualized musculature is grossly unremarkable. Scattered calcification is noted along the superficial femoral artery, profunda femoris artery, popliteal artery and proximal branches of the popliteal artery. No acute osseous abnormalities are seen. The bladder is significantly distended. There is mild herniation of fat into the patient's pannus, anterior to the bladder. IMPRESSION: 1. Soft tissue inflammation tracking along the distal right lower extremity, from the level of the mid calf inferiorly about the ankle. Associated skin thickening seen. No evidence of abscess. 2. Small knee joint effusion noted. 3. Scattered vascular calcifications seen. 4. Mild herniation of fat at the patient's pannus, anterior to the bladder. Electronically Signed   By: Roanna Raider M.D.   On: 05/11/2016 02:17     Scheduled Meds: . atorvastatin  20 mg Oral q1800  . budesonide  0.5 mg Nebulization BID  .  ceFAZolin (ANCEF) IV  1 g Intravenous Q8H  . enoxaparin (LOVENOX) injection  50 mg Subcutaneous Q24H  . gabapentin  300 mg Oral BID  . guaiFENesin  600 mg Oral BID  . hydrocortisone sod succinate (SOLU-CORTEF) inj  50 mg Intravenous Q12H  . insulin aspart  0-15 Units Subcutaneous TID WC  . insulin aspart  0-5 Units Subcutaneous QHS  . insulin aspart  4 Units Subcutaneous TID WC  . insulin detemir  22 Units Subcutaneous BID  . ipratropium-albuterol  3 mL Nebulization BID  . pantoprazole  40 mg Oral Daily  . potassium chloride SA  20 mEq Oral Daily  . sodium chloride flush  3 mL Intravenous Q12H   Continuous Infusions: . sodium chloride Stopped (05/11/16  1459)  . sodium chloride 75 mL/hr at 05/12/16 1005  . dextrose 5 % and 0.45% NaCl Stopped (05/12/16 1005)     LOS: 3 days   Time Spent in minutes  30 minutes  Tayley Mudrick D.O. on 05/12/2016 at 1:10 PM  Between 7am to 7pm - Pager - [phone]  After 7pm go to [email or web address] - password [email or web address]  And look for the night coverage person covering for me after hours  [facility] Office  [phone]

## 2016-05-12 NOTE — Telephone Encounter (Signed)
Call placed to Gae Gallop, RN CM informing her that the patient is known to [facility].  Will continue to follow hospital course and schedule follow up appointment.

## 2016-05-12 NOTE — Progress Notes (Signed)
RT set up patient CPAP on auto 6min and 20max. Patient states she doesn't need any assistance in putting on mask. NO O2 bleed in needed.  

## 2016-05-13 LAB — GLUCOSE, CAPILLARY
GLUCOSE-CAPILLARY: 346 mg/dL — AB (ref 65–99)
Glucose-Capillary: 267 mg/dL — ABNORMAL HIGH (ref 65–99)
Glucose-Capillary: 286 mg/dL — ABNORMAL HIGH (ref 65–99)
Glucose-Capillary: 298 mg/dL — ABNORMAL HIGH (ref 65–99)

## 2016-05-13 LAB — CBC
HEMATOCRIT: 30.4 % — AB (ref 36.0–46.0)
Hemoglobin: 9.8 g/dL — ABNORMAL LOW (ref 12.0–15.0)
MCH: 28.7 pg (ref 26.0–34.0)
MCHC: 32.2 g/dL (ref 30.0–36.0)
MCV: 88.9 fL (ref 78.0–100.0)
Platelets: 301 10*3/uL (ref 150–400)
RBC: 3.42 MIL/uL — ABNORMAL LOW (ref 3.87–5.11)
RDW: 13.7 % (ref 11.5–15.5)
WBC: 9.4 10*3/uL (ref 4.0–10.5)

## 2016-05-13 LAB — BASIC METABOLIC PANEL
ANION GAP: 8 (ref 5–15)
BUN: 6 mg/dL (ref 6–20)
CALCIUM: 7.3 mg/dL — AB (ref 8.9–10.3)
CO2: 23 mmol/L (ref 22–32)
CREATININE: 0.69 mg/dL (ref 0.44–1.00)
Chloride: 104 mmol/L (ref 101–111)
Glucose, Bld: 300 mg/dL — ABNORMAL HIGH (ref 65–99)
Potassium: 3.2 mmol/L — ABNORMAL LOW (ref 3.5–5.1)
SODIUM: 135 mmol/L (ref 135–145)

## 2016-05-13 MED ORDER — INSULIN ASPART 100 UNIT/ML ~~LOC~~ SOLN
8.0000 [IU] | Freq: Three times a day (TID) | SUBCUTANEOUS | Status: DC
  Administered 2016-05-13 – 2016-05-14 (×3): 8 [IU] via SUBCUTANEOUS

## 2016-05-13 MED ORDER — INSULIN DETEMIR 100 UNIT/ML ~~LOC~~ SOLN
26.0000 [IU] | Freq: Two times a day (BID) | SUBCUTANEOUS | Status: DC
  Administered 2016-05-13 – 2016-05-14 (×2): 26 [IU] via SUBCUTANEOUS
  Filled 2016-05-13 (×3): qty 0.26

## 2016-05-13 MED ORDER — POTASSIUM CHLORIDE CRYS ER 20 MEQ PO TBCR
40.0000 meq | EXTENDED_RELEASE_TABLET | Freq: Once | ORAL | Status: AC
  Administered 2016-05-13: 40 meq via ORAL
  Filled 2016-05-13: qty 2

## 2016-05-13 MED ORDER — HYDROCORTISONE NA SUCCINATE PF 100 MG IJ SOLR
25.0000 mg | Freq: Two times a day (BID) | INTRAMUSCULAR | Status: AC
  Administered 2016-05-13 – 2016-05-14 (×2): 25 mg via INTRAVENOUS
  Filled 2016-05-13 (×2): qty 2

## 2016-05-13 MED ORDER — INSULIN ASPART 100 UNIT/ML ~~LOC~~ SOLN: 8.0000 [IU] | Freq: Three times a day (TID) | SUBCUTANEOUS | Status: DC

## 2016-05-13 MED ORDER — FUROSEMIDE 10 MG/ML IJ SOLN
20.0000 mg | Freq: Once | INTRAMUSCULAR | Status: AC
  Administered 2016-05-13: 20 mg via INTRAVENOUS
  Filled 2016-05-13: qty 2

## 2016-05-13 NOTE — Hospital Discharge Follow-Up (Signed)
Transitional Care Clinic at Center:  Patient known to [facility] and [facility]. During previous admission, patient agreeable to follow-up with the [facility] after discharge. Appointment scheduled for 05/12/16; however, patient readmitted on 05/09/16. This Case Manager met with patient at bedside to determine plans for post-discharge follow-up after discharge. Patient agreeable to follow-up with the [facility] after discharge. Appointment scheduled for 05/21/16 at 1115 with Dr. Jarold Song. AVS update. Reiterated services available at [place] [facility] and reminded patient she would benefit from meeting with Development worker, community at [facility] and [facility] after discharge to determine if eligible for the [employer] or [employer]. Patient verbalized understanding and appreciative of information. Whitman Hero, RN CM updated. Will continue to follow patient's clinical progress closely.

## 2016-05-13 NOTE — Progress Notes (Signed)
[facility]                                                                              Patient Demographics  Carla Hicks, is a 45 y.o. female, DOB - 05/08/1971, ZOX:[id number]  Admit date - 05/09/2016   Admitting Physician Therisa Doyne, MD  Outpatient Primary MD for the patient is Lora Paula, MD  Outpatient specialists:   LOS - 4  days    Chief Complaint  Patient presents with  . Chest Pain  . Shortness of Breath       Brief summary   45 year old female with history of asthma, alcoholic pancreatitis, DKA admissions, diabetes, presented to the emergency room with complaints of right lower extremity pain and swelling. Patient treated for cellulitis. The CCM as well as general surgery consulted.   Assessment & Plan   Severe Sepsis secondary to right lower extremity cellulitis-Upon admission, patient was febrile with tachycardia, leukocytosis, tachypnea - Improving, she was mildly hypotensive with elevated lactic acid. Lactic acid has normalized, leukocytosis trending down.  -Lactic acid level had normalized, leukocytosis trending downward. Hypertension was treated with IV fluids and Solu-Cortef. CCM was consulted.  - I's and O's with 8.4 L positive. Will give Lasix 20 mg IV 1  -Patient did have hypotension which is responding to IV fluids and solucortef, wean off Solu-Cortef  -initially placed on vancomycin, cefepime, flagyl- transitioned to Ancef -Blood cultures show no growth to date -Gen. surgery consulted and appreciated, recommended continuing antibiotics, if cellulitis extends beyond the marked area, obtain CT or MRI.  CT showed soft tissue inflammation, no abcess - Obtain venous Doppler to rule out DVT in right leg  Acute kidney injury -Resolved, likely secondary to sepsis  Diabetes mellitus, type II uncontrolled with neuropathy  - Hemoglobin A1c 10.0 on 5/21  -Patient was also placed on insulin drip and then transitioned back to  subcutaneous insulin - Increase Levemir to 26 units twice a day with NovoLog 8 units 3 times a day with meal coverage, sliding scale insulin  History of asthma -Supposedly quit smoking 4 days ago (prior to admission), does not appear to be in exacerbation at this time -Continue supplemental oxygen to maintain saturations above 90% -Continue nebulizers as needed  Essential hypertension -Home medications held, BP improving   Tobacco abuse -Patient counseled regarding smoking cessation  History of alcoholism and alcoholic induced pancreatitis -Alcohol level currently unremarkable -Continue to monitor closely  QT prolongation -Avoid medications with QT prolonging effects -Continue to monitor telemetry  Code Status:  Full CODE STATUS  DVT Prophylaxis:  Lovenox    Family Communication: Discussed in detail with the patient, all imaging results, lab results explained to the patient    Disposition Plan:  Time Spent in minutes   25  minutes  Procedures:  None  Consultants:   General surgery CCM  Antimicrobials:   Cefazolin 5/23   Medications  Scheduled Meds: . atorvastatin  20 mg Oral q1800  . budesonide  0.5 mg Nebulization BID  .  ceFAZolin (ANCEF) IV  1 g Intravenous Q8H  . enoxaparin (LOVENOX) injection  50 mg Subcutaneous Q24H  .  gabapentin  300 mg Oral BID  . guaiFENesin  600 mg Oral BID  . hydrocortisone sod succinate (SOLU-CORTEF) inj  50 mg Intravenous Q12H  . insulin aspart  0-15 Units Subcutaneous TID WC  . insulin aspart  0-5 Units Subcutaneous QHS  . insulin aspart  4 Units Subcutaneous TID WC  . insulin detemir  22 Units Subcutaneous BID  . ipratropium-albuterol  3 mL Nebulization BID  . pantoprazole  40 mg Oral Daily  . potassium chloride SA  20 mEq Oral Daily  . sodium chloride flush  3 mL Intravenous Q12H   Continuous Infusions: . sodium chloride Stopped (05/11/16 1459)  . sodium chloride 75 mL/hr (05/13/16 0559)  . dextrose 5 % and 0.45%  NaCl Stopped (05/12/16 1005)   PRN Meds:.acetaminophen **OR** acetaminophen, dextrose, HYDROcodone-acetaminophen, levalbuterol, morphine injection, oxyCODONE   Antibiotics   Anti-infectives    Start     Dose/Rate Route Frequency Ordered Stop   05/12/16 1400  ceFAZolin (ANCEF) IVPB 1 g/50 mL premix     1 g 100 mL/hr over 30 Minutes Intravenous Every 8 hours 05/12/16 1308     05/11/16 1600  cefTAZidime (FORTAZ) 1 g in dextrose 5 % 50 mL IVPB  Status:  Discontinued     1 g 100 mL/hr over 30 Minutes Intravenous Every 8 hours 05/11/16 1432 05/12/16 1308   05/10/16 1800  ceFEPIme (MAXIPIME) 2 g in dextrose 5 % 50 mL IVPB  Status:  Discontinued     2 g 100 mL/hr over 30 Minutes Intravenous Every 12 hours 05/10/16 0736 05/11/16 1427   05/10/16 1100  vancomycin (VANCOCIN) IVPB 750 mg/150 ml premix  Status:  Discontinued     750 mg 150 mL/hr over 60 Minutes Intravenous Every 12 hours 05/09/16 2208 05/11/16 1427   05/10/16 0800  ceFEPIme (MAXIPIME) 1 g in dextrose 5 % 50 mL IVPB  Status:  Discontinued     1 g 100 mL/hr over 30 Minutes Intravenous Every 8 hours 05/09/16 2208 05/10/16 0736   05/10/16 0200  metroNIDAZOLE (FLAGYL) IVPB 500 mg  Status:  Discontinued     500 mg 100 mL/hr over 60 Minutes Intravenous Every 8 hours 05/10/16 0101 05/12/16 1308   05/09/16 2130  ceFEPIme (MAXIPIME) 2 g in dextrose 5 % 50 mL IVPB     2 g 100 mL/hr over 30 Minutes Intravenous  Once 05/09/16 2127 05/09/16 2253   05/09/16 2130  vancomycin (VANCOCIN) 2,000 mg in sodium chloride 0.9 % 500 mL IVPB     2,000 mg 250 mL/hr over 120 Minutes Intravenous  Once 05/09/16 2127 05/10/16 0157        Subjective:   Chelan Heringer was seen and examined today. Upset over not getting her food yesterday.Patient denies dizziness, chest pain, shortness of breath, abdominal pain, N/V/D/C, new weakness, numbess, tingling. No acute events overnight.  Right lower extremity pain controlled  Objective:   Filed Vitals:    05/12/16 2254 05/13/16 0531 05/13/16 1026 05/13/16 1031  BP:  138/69    Pulse: 76 85    Temp:  98.7 F (37.1 C)    TempSrc:  Oral    Resp: 18 20    Height:      Weight:      SpO2: 98% 97% 98% 98%    Intake/Output Summary (Last 24 hours) at 05/13/16 1231 Last data filed at 05/13/16 0900  Gross per 24 hour  Intake 1993.75 ml  Output      0 ml  Net 1993.75  ml     Wt Readings from Last 3 Encounters:  05/10/16 102 kg (224 lb 13.9 oz)  04/30/16 106 kg (233 lb 11 oz)  12/27/15 116.937 kg (257 lb 12.8 oz)     Exam  General: Alert and oriented x 3, NAD  HEENT:  PERRLA, EOMI, Anicteric Sclera, mucous membranes moist.   Neck: Supple, no JVD, no masses  Cardiovascular: S1 S2 auscultated, no rubs, murmurs or gallops. Regular rate and rhythm.  Respiratory: Clear to auscultation bilaterally, no wheezing, rales or rhonchi  Gastrointestinal: Soft, nontender, nondistended, + bowel sounds  Ext: no cyanosis clubbing. Right lower extremity with edema, blistering and erythema   Neuro: AAOx3, Cr N's II- XII. Strength 5/5 upper and lower extremities bilaterally  Skin: No rashes  Psych: Normal affect and demeanor, alert and oriented x3    Data Reviewed:  I have personally reviewed following labs and imaging studies  Micro Results Recent Results (from the past 240 hour(s))  Blood culture (routine x 2)     Status: None (Preliminary result)   Collection Time: 05/09/16  8:45 PM  Result Value Ref Range Status   Specimen Description BLOOD LEFT ANTECUBITAL  Final   Special Requests BOTTLES DRAWN AEROBIC AND ANAEROBIC 5CC   Final   Culture NO GROWTH 3 DAYS  Final   Report Status PENDING  Incomplete  Blood culture (routine x 2)     Status: None (Preliminary result)   Collection Time: 05/10/16  2:10 AM  Result Value Ref Range Status   Specimen Description BLOOD RIGHT ANTECUBITAL  Final   Special Requests BOTTLES DRAWN AEROBIC AND ANAEROBIC 5CC  Final   Culture NO GROWTH 2 DAYS  Final     Report Status PENDING  Incomplete    Radiology Reports Dg Chest 2 View  05/12/2016  CLINICAL DATA:  Wheezing and cough. EXAM: CHEST  2 VIEW COMPARISON:  05/09/2016 FINDINGS: Cardiomediastinal silhouette is normal. Mediastinal contours appear intact. There is no evidence of pleural effusion or pneumothorax. Lung volumes are low with streaky bibasilar airspace opacities, and generalized increased interstitial markings. Osseous structures are without acute abnormality. Soft tissues are grossly normal. IMPRESSION: Low lung volumes with increased interstitial markings. An element of interstitial pulmonary edema cannot be excluded with this appearance. Streaky bibasilar airspace opacities, most consistent with atelectasis. Electronically Signed   By: Ted Mcalpine M.D.   On: 05/12/2016 09:04   Dg Chest 2 View  05/09/2016  CLINICAL DATA:  Pain. EXAM: CHEST  2 VIEW COMPARISON:  May 07, 2016 FINDINGS: Mild bibasilar atelectasis. Linear opacity in the lingula is probably atelectatic as well. No pneumothorax. No other interval changes or acute abnormalities. A rounded density in the left hilum is consistent with a vessel on end. No nodule was seen in this location on the recent CT scan. IMPRESSION: Increasing atelectasis in the lingula with persistent atelectasis in the bases. Electronically Signed   By: Gerome Sam III M.D   On: 05/09/2016 22:07   Dg Chest 2 View  05/07/2016  CLINICAL DATA:  Productive cough 3 days EXAM: CHEST  2 VIEW COMPARISON:  Radiograph 04/29/2016 FINDINGS: Normal cardiac silhouette. Mild basilar atelectasis. No effusion infiltrate or pneumothorax. IMPRESSION: Mild basilar atelectasis.  No edema or infiltrate. Electronically Signed   By: Genevive Bi M.D.   On: 05/07/2016 12:44   Dg Chest 2 View  04/29/2016  CLINICAL DATA:  Epigastric pain, shortness of breath, cough for 2 days, diabetes mellitus, asthma, former smoker EXAM: CHEST  2 VIEW  COMPARISON:  12/27/2015 FINDINGS:  Upper normal heart size. Mediastinal contours and pulmonary vascularity normal. Minimal RIGHT basilar atelectasis. Lungs otherwise clear. No pleural effusion or pneumothorax. Bones unremarkable. IMPRESSION: Minimal RIGHT basilar atelectasis. Electronically Signed   By: [name] M.D.   On: 04/29/2016 17:26   Dg Tibia/fibula Right  05/09/2016  CLINICAL DATA:  Pain without reported trauma. EXAM: RIGHT TIBIA AND FIBULA - 2 VIEW COMPARISON:  None. FINDINGS: Soft tissue edema. Vascular calcifications are seen. No fracture or dislocation. No bony erosion or evidence of osteomyelitis IMPRESSION: Soft tissue edema.  No underlying bony abnormality identified. Electronically Signed   By: Gerome Sam III M.D   On: 05/09/2016 22:09   Dg Abd 1 View  04/19/2016  CLINICAL DATA:  Abdominal distention EXAM: ABDOMEN - 1 VIEW COMPARISON:  CT abdomen pelvis dated 04/16/2016 FINDINGS: Nonobstructive bowel gas pattern. Mildly prominent but nondilated loops of small bowel in the left lower abdomen, nonspecific. Mild degenerative changes the lower lumbar spine. IMPRESSION: Unremarkable abdominal radiograph. Electronically Signed   By: Charline Bills M.D.   On: 04/19/2016 16:03   Ct Angio Chest Pe W/cm &/or Wo Cm  05/07/2016  CLINICAL DATA:  LEFT-sided pleuritic chest pain EXAM: CT ANGIOGRAPHY CHEST WITH CONTRAST TECHNIQUE: Multidetector CT imaging of the chest was performed using the standard protocol during bolus administration of intravenous contrast. Multiplanar CT image reconstructions and MIPs were obtained to evaluate the vascular anatomy. CONTRAST:  100 mL Isovue COMPARISON:  CT chest 08/30/2014 FINDINGS: Mediastinum/Nodes: Motion degradation of the exam results in poor evaluation of the smaller pulmonary arteries. There is no filling defects within the main pulmonary arteries or interlobar arteries to suggest acute pulmonary embolism. No axillary or supraclavicular adenopathy. No mediastinal hilar adenopathy.  Esophagus normal. Lungs/Pleura: Mild ground-glass opacities in the lungs. No pulmonary infarction. No linear atelectasis in the RIGHT middle lobe. Upper abdomen: The pancreas is enlarged the suggesting underlying pancreatic edema. Cannot exclude pancreatic mass but inflammation is favored. Entirety pancreas is not imaged. Imaged vessels are normal Musculoskeletal: No aggressive osseous lesion Review of the MIP images confirms the above findings. IMPRESSION: Enlarged pancreas suggests pancreatic edema / inflammation. Recommend clinical correlation for acute pancreatitis. No evidence of acute pulmonary embolism. Examination of the smaller arteries is degraded by respiratory motion. Electronically Signed   By: Genevive Bi M.D.   On: 05/07/2016 14:46   US Abdomen Complete  04/29/2016  CLINICAL DATA:  Abdominal pain for 3 weeks. Patient with history of pancreatitis as seen on the CT scan 04/16/2016. Initial encounter. EXAM: ABDOMEN ULTRASOUND COMPLETE COMPARISON:  CT abdomen and pelvis and abdominal ultrasound 04/16/2016 FINDINGS: Gallbladder: Sludge is seen in the gallbladder. No stones, wall thickening or pericholecystic fluid. Sonographer reports negative Murphy's sign. Common bile duct: Diameter: 0.4 cm Liver: Increased echogenicity is consistent with fatty infiltration. No focal lesion. IVC: No abnormality visualized. Pancreas: Appears somewhat edematous with decreased echogenicity seen. No fluid collection is identified. Spleen: Size and appearance within normal limits. Right Kidney: Length: 11.7 cm. Echogenicity within normal limits. No hydronephrosis visualized. 1 cm simple cyst incidentally noted. Left Kidney: Length: 12.3 cm. Echogenicity within normal limits. No mass or hydronephrosis visualized. Abdominal aorta: No aneurysm visualized. Other findings: None. IMPRESSION: The pancreas appears somewhat edematous suggestive of pancreatitis. No focal lesion or peripancreatic fluid collection is identified.  Diffuse fatty infiltration of the liver. Small volume of sludge in the gallbladder. Negative for stones or cholecystitis. Electronically Signed   By: Drusilla Kanner M.D.   On:  04/29/2016 21:53   Ct Abdomen Pelvis W Contrast  04/16/2016  CLINICAL DATA:  Patient with right upper quadrant pain radiating to the back. EXAM: CT ABDOMEN AND PELVIS WITH CONTRAST TECHNIQUE: Multidetector CT imaging of the abdomen and pelvis was performed using the standard protocol following bolus administration of intravenous contrast. CONTRAST:  ISOVUE-300 IOPAMIDOL (ISOVUE-300) INJECTION 61% COMPARISON:  None. FINDINGS: Lower chest: Normal heart size. Dependent atelectasis and/or scarring within the bilateral lower lobes. No pleural effusion. Hepatobiliary: The liver is normal in size and contour. No focal hepatic lesions identified. Fatty deposition adjacent to the falciform ligament. Gallbladder is unremarkable. Pancreas: The pancreas is edematous. There is a large amount of surrounding fat stranding and fluid. The pancreatic parenchyma enhances without evidence for necrosis. Spleen: Unremarkable Adrenals/Urinary Tract: The adrenal glands are normal. Kidneys enhance symmetrically with contrast. No hydronephrosis. Urinary bladder is unremarkable. Focal atrophy inferior pole right kidney. Too small to characterize low-attenuation lesion interpolar region right kidney. Stomach/Bowel: Normal appendix. No evidence for bowel obstruction. Normal morphology to the stomach. No free intraperitoneal air. Vascular/Lymphatic: Normal caliber abdominal aorta. Circumaortic left renal vein. No retroperitoneal adenopathy. The splenic and portal veins are patent. Other: Uterus and adnexal structures are unremarkable. Musculoskeletal: Small fat containing ventral abdominal wall hernia (image 78; series 2). No aggressive or acute appearing osseous lesions. IMPRESSION: Findings compatible with acute pancreatitis. Electronically Signed   By: Annia Belt M.D.   On: 04/16/2016 17:34   Ct Femur Right W Contrast  05/11/2016  CLINICAL DATA:  Acute onset of worsening right lower extremity cellulitis, with swelling and erythema. Initial encounter. EXAM: CT OF THE LOWER RIGHT EXTREMITY WITH CONTRAST TECHNIQUE: Multidetector CT imaging of the right lower extremity was performed according to the standard protocol following intravenous contrast administration. COMPARISON:  Right tibia/fibula radiographs performed 05/09/2016 CONTRAST:  ISOVUE-300 IOPAMIDOL (ISOVUE-300) INJECTION 61% FINDINGS: Soft tissue inflammation is noted tracking along the distal right lower extremity, from the level of the mid calf inferiorly about the ankle. Associated skin thickening is seen. There is no evidence of abscess at this time. A small knee joint effusion is noted. The right thigh is grossly unremarkable in appearance. Right inguinal nodes remain borderline normal in size. The visualized musculature is grossly unremarkable. Scattered calcification is noted along the superficial femoral artery, profunda femoris artery, popliteal artery and proximal branches of the popliteal artery. No acute osseous abnormalities are seen. The bladder is significantly distended. There is mild herniation of fat into the patient's pannus, anterior to the bladder. IMPRESSION: 1. Soft tissue inflammation tracking along the distal right lower extremity, from the level of the mid calf inferiorly about the ankle. Associated skin thickening seen. No evidence of abscess. 2. Small knee joint effusion noted. 3. Scattered vascular calcifications seen. 4. Mild herniation of fat at the patient's pannus, anterior to the bladder. Electronically Signed   By: Roanna Raider M.D.   On: 05/11/2016 02:17   Ct Tibia Fibula Right W Contrast  05/11/2016  CLINICAL DATA:  Acute onset of worsening right lower extremity cellulitis, with swelling and erythema. Initial encounter. EXAM: CT OF THE LOWER RIGHT EXTREMITY WITH  CONTRAST TECHNIQUE: Multidetector CT imaging of the right lower extremity was performed according to the standard protocol following intravenous contrast administration. COMPARISON:  Right tibia/fibula radiographs performed 05/09/2016 CONTRAST:  ISOVUE-300 IOPAMIDOL (ISOVUE-300) INJECTION 61% FINDINGS: Soft tissue inflammation is noted tracking along the distal right lower extremity, from the level of the mid calf inferiorly about the ankle. Associated  skin thickening is seen. There is no evidence of abscess at this time. A small knee joint effusion is noted. The right thigh is grossly unremarkable in appearance. Right inguinal nodes remain borderline normal in size. The visualized musculature is grossly unremarkable. Scattered calcification is noted along the superficial femoral artery, profunda femoris artery, popliteal artery and proximal branches of the popliteal artery. No acute osseous abnormalities are seen. The bladder is significantly distended. There is mild herniation of fat into the patient's pannus, anterior to the bladder. IMPRESSION: 1. Soft tissue inflammation tracking along the distal right lower extremity, from the level of the mid calf inferiorly about the ankle. Associated skin thickening seen. No evidence of abscess. 2. Small knee joint effusion noted. 3. Scattered vascular calcifications seen. 4. Mild herniation of fat at the patient's pannus, anterior to the bladder. Electronically Signed   By: Roanna Raider M.D.   On: 05/11/2016 02:17   Dg Foot Complete Right  05/09/2016  CLINICAL DATA:  45 year old presenting with 2 day history of edema and erythema involving the right lower leg and right foot. Current history of diabetes. EXAM: RIGHT FOOT COMPLETE - 3+ VIEW COMPARISON:  04/29/2016. FINDINGS: No evidence of acute, subacute or healed fractures. Well preserved joint spaces. Well preserved bone mineral density. Small plantar calcaneal spur. Calcification involving the dorsalis pedis  artery and the anterior and posterior tibial arteries. IMPRESSION: No acute or significant abnormalities. Electronically Signed   By: Hulan Saas M.D.   On: 05/09/2016 22:12   Dg Foot Complete Right  04/29/2016  CLINICAL DATA:  Stepped on nail with RIGHT foot 3 weeks ago, RIGHT foot pain, diabetes mellitus, initial encounter EXAM: RIGHT FOOT COMPLETE - 3+ VIEW COMPARISON:  None FINDINGS: Osseous mineralization normal. Joint spaces preserved. Soft tissue swelling at medial RIGHT foot. Tiny plantar calcaneal spur. No acute fracture, dislocation or bone destruction. No definite soft tissue gas. Scattered small vessel vascular calcifications. IMPRESSION: No definite acute bony abnormalities. Electronically Signed   By: [name] M.D.   On: 04/29/2016 17:27   US Abdomen Limited Ruq  04/16/2016  CLINICAL DATA:  Right upper quadrant pain for 2 days EXAM: US ABDOMEN LIMITED - RIGHT UPPER QUADRANT COMPARISON:  12/11/2014 FINDINGS: Gallbladder: No gallstones or wall thickening visualized. No sonographic Murphy sign noted by sonographer. Common bile duct: Diameter: 3.4 mm. Liver: Mildly increased in echogenicity consistent with fatty infiltration. IMPRESSION: Fatty liver.  No acute abnormality noted. Electronically Signed   By: Alcide Clever M.D.   On: 04/16/2016 15:17    Lab Data:  CBC:  Recent Labs Lab 05/07/16 1205 05/09/16 2045 05/10/16 0558 05/11/16 0435 05/12/16 0358 05/13/16 0612  WBC 9.4 13.6* 9.7 14.6* 12.4* 9.4  NEUTROABS 6.7 8.9*  --   --   --   --   HGB 11.5* 13.0 10.7* 10.2* 9.5* 9.8*  HCT 34.8* 39.6 33.9* 31.4* 29.4* 30.4*  MCV 87.9 89.4 88.3 88.7 86.7 88.9  PLT 299 295 233 277 297 301   Basic Metabolic Panel:  Recent Labs Lab 05/10/16 0558 05/10/16 1750 05/11/16 0435 05/12/16 0358 05/13/16 0612  NA 132* 126* 136 136 135  K 4.2 4.3 4.0 3.7 3.2*  CL 97* 97* 104 105 104  CO2 25 20* 25 21* 23  GLUCOSE 260* 530* 91 220* 300*  BUN 8 13 10 6 6   CREATININE 0.85 1.04* 0.71  0.53 0.69  CALCIUM 7.2* 6.9* 7.5* 7.4* 7.3*  MG 1.3*  --   --   --   --  PHOS 2.6  --   --   --   --    GFR: Estimated Creatinine Clearance: 104.3 mL/min (by C-G formula based on Cr of 0.69). Liver Function Tests:  Recent Labs Lab 05/09/16 2045 05/10/16 0558  AST 27 13*  ALT 13* 10*  ALKPHOS 59 47  BILITOT 1.1 1.1  PROT 7.4 5.8*  ALBUMIN 2.4* 1.8*    Recent Labs Lab 05/09/16 2045  LIPASE 13   No results for input(s): AMMONIA in the last 168 hours. Coagulation Profile:  Recent Labs Lab 05/10/16 0620  INR 1.32   Cardiac Enzymes: No results for input(s): CKTOTAL, CKMB, CKMBINDEX, TROPONINI in the last 168 hours. BNP (last 3 results) No results for input(s): PROBNP in the last 8760 hours. HbA1C: No results for input(s): HGBA1C in the last 72 hours. CBG:  Recent Labs Lab 05/12/16 1245 05/12/16 1646 05/12/16 2155 05/13/16 0758 05/13/16 1204  GLUCAP 284* 170* 151* 267* 346*   Lipid Profile: No results for input(s): CHOL, HDL, LDLCALC, TRIG, CHOLHDL, LDLDIRECT in the last 72 hours. Thyroid Function Tests: No results for input(s): TSH, T4TOTAL, FREET4, T3FREE, THYROIDAB in the last 72 hours. Anemia Panel:  Recent Labs  05/12/16 0716  VITAMINB12 808  FOLATE 10.5  FERRITIN 263  TIBC 197*  IRON 59  RETICCTPCT 1.0   Urine analysis:    Component Value Date/Time   COLORURINE YELLOW 05/10/2016 0639   APPEARANCEUR CLOUDY* 05/10/2016 0639   LABSPEC 1.010 05/10/2016 0639   PHURINE 6.0 05/10/2016 0639   GLUCOSEU 100* 05/10/2016 0639   HGBUR TRACE* 05/10/2016 0639   BILIRUBINUR NEGATIVE 05/10/2016 0639   BILIRUBINUR neg 09/18/2015 1131   KETONESUR 15* 05/10/2016 0639   PROTEINUR NEGATIVE 05/10/2016 0639   PROTEINUR 30 09/18/2015 1131   UROBILINOGEN 0.2 09/18/2015 1131   UROBILINOGEN 0.2 12/11/2014 1522   NITRITE NEGATIVE 05/10/2016 0639   NITRITE neg 09/18/2015 1131   LEUKOCYTESUR NEGATIVE 05/10/2016 0639     RAI,RIPUDEEP M.D. [facility] 05/13/2016, 12:31 PM  Pager: [phone] Between 7am to 7pm - call Pager - [phone]  After 7pm go to [email or web address] - password [email or web address]  Call night coverage person covering after 7pm

## 2016-05-13 NOTE — Consult Note (Addendum)
WOC wound consult note Reason for Consult: Consult requested for right leg.  Pt developed generalized edema and erythema this week and has been on antibiotics for cellulitis. Wound type: Right anterior and posterior calf with generalized erythremia from foot to below knee; previously marked erythremia is receeding and has evolved into yellow, fluid filled blisters. Measurement: 4X4 and 6X6cm, blisters with intact skin, but expect they will rupture and evolve into partial thickness skin loss soon and have large amt yellow drainage. Because of the amount of expected drainage, I do not recommend Una boots at this time, which would trap the moisture against the skin for a prolonged period of time until the dressings were changed once or twice a week.   Dressing procedure/placement/frequency: Applied xeroform gauze to promote drying, then ABD pads to absorb drainage, and ace wrap for light compression. Instructions provided for bedside nurses to change dressings Q day until locations demonstrate drying and healing. Pt could benefit from home health assistance for dressing changes until wounds improve; please order if desired.  Discussed plan of care with patient and she denies further questions. Please re-consult if further assistance is needed.  Thank-you,  Cammie Mcgee MSN, RN, CWOCN, [place], CNS [phone]

## 2016-05-13 NOTE — Progress Notes (Signed)
Pt's ACE wrap bandage is changed per request of patient (according to there it was messed up getting up to the bathroom), but pt wants to wrap all her toes also, educated the patient that she needs to wiggle her toes for circulation, will follow up with the wound nurse tomorrow.

## 2016-05-13 NOTE — Progress Notes (Signed)
RT set up patient CPAP on auto and . Patient states she doesn't need any assistance in putting on mask. NO O2 bleed in needed.

## 2016-05-14 ENCOUNTER — Inpatient Hospital Stay (HOSPITAL_COMMUNITY): Payer: MEDICAID

## 2016-05-14 ENCOUNTER — Encounter (HOSPITAL_COMMUNITY): Payer: Self-pay

## 2016-05-14 LAB — CULTURE, BLOOD (ROUTINE X 2): CULTURE: NO GROWTH

## 2016-05-14 LAB — GLUCOSE, CAPILLARY: Glucose-Capillary: 281 mg/dL — ABNORMAL HIGH (ref 65–99)

## 2016-05-14 MED ORDER — FUROSEMIDE 10 MG/ML IJ SOLN: 20.0000 mg | Freq: Once | INTRAMUSCULAR | Status: DC

## 2016-05-14 MED ORDER — HYDROCODONE-ACETAMINOPHEN 5-325 MG PO TABS: 1.0000 | ORAL_TABLET | ORAL | Status: DC | PRN

## 2016-05-14 MED ORDER — INSULIN ASPART 100 UNIT/ML ~~LOC~~ SOLN: 0.0000 [IU] | Freq: Three times a day (TID) | SUBCUTANEOUS | Status: DC

## 2016-05-14 MED ORDER — INSULIN DETEMIR 100 UNIT/ML ~~LOC~~ SOLN: 25.0000 [IU] | Freq: Two times a day (BID) | SUBCUTANEOUS | Status: DC

## 2016-05-14 MED ORDER — DOXYCYCLINE HYCLATE 100 MG PO TABS: 100.0000 mg | ORAL_TABLET | Freq: Two times a day (BID) | ORAL | Status: DC

## 2016-05-14 NOTE — Progress Notes (Signed)
Inpatient Diabetes Program Recommendations  AACE/ADA: New Consensus Statement on Inpatient Glycemic Control (2015)  Target Ranges:  Prepandial:   less than 140 mg/dL      Peak postprandial:   less than 180 mg/dL (1-2 hours)      Critically ill patients:  140 - 180 mg/dL   Results for BRYANT, LIPPS (MRN [id number]) as of 05/14/2016 11:04  Ref. Range 05/13/2016 07:58 05/13/2016 12:04 05/13/2016 16:30 05/13/2016 22:20 05/14/2016 08:07  Glucose-Capillary Latest Ref Range: 65-99 mg/dL 409 (H) 811 (H) 914 (H) 298 (H) 281 (H)   Review of Glycemic Control  Diabetes history: DM 2 Outpatient Diabetes medications: Levemir 22 units BID Current orders for Inpatient glycemic control: Levemir 26 units BID, Novolog Moderate + HS scale + Novolog 8 units TID meal coverage  Inpatient Diabetes Program Recommendations: Insulin - Basal: Glucose still elevated in the 200's. Please consider increasing Levemir to 30 units BID.   Thanks,  Christena Deem RN, MSN, [facility] Inpatient Diabetes Coordinator Team Pager [phone] (8a-5p)

## 2016-05-14 NOTE — Progress Notes (Signed)
Pt is refusing venous doppler, pt is aware that a DVT can not be ruled out at this time. Pt wants to go home ASAP. Md is made aware of the refusal.

## 2016-05-14 NOTE — Discharge Summary (Signed)
Physician Discharge Summary   Patient ID: Carla Hicks MRN: [id number] DOB/AGE: October 06, 1971 45 y.o.  Admit date: 05/09/2016 Discharge date: 05/14/2016  Primary Care Physician:  Minerva Ends, MD  Discharge Diagnoses:    Sepsis secondary to right lower activity cellulitis  Acute kidney injury  Diabetes mellitus uncontrolled with neuropathy  . Alcohol-induced chronic pancreatitis ([place]) . Chronic diastolic CHF (congestive heart failure) ([place]) . Essential hypertension, benign . Hypokalemia . Cellulitis . Asthma . Alcohol abuse, in remission  Consults:  Gen. Surgery  pulmonology   Outpatient Follow-up:  1.  patient was treatment with dressing changes 2. Please repeat CBC/BMET at next visit  DI, modified   Allergies:   Allergies  Allergen Reactions  . Penicillins Hives    Has patient had a PCN reaction causing immediate rash, facial/tongue/throat swelling, SOB or lightheadedness with hypotension: yes Has patient had a PCN reaction causing severe rash involving mucus membranes or skin necrosis: no Has patient had a PCN reaction that required hospitalization no Has patient had a PCN reaction occurring within the last 10 years: no If all of the above answers are "NO", then may proceed with Cephalosporin use.   . Robitussin Liquid Center [Menthol] Hives     DISCHARGE MEDICATIONS: Current Discharge Medication List    START taking these medications   Details  doxycycline (VIBRA-TABS) 100 MG tablet Take 1 tablet (100 mg total) by mouth 2 (two) times daily. Qty: 20 tablet, Refills: 0    HYDROcodone-acetaminophen (NORCO/VICODIN) 5-325 MG tablet Take 1-2 tablets by mouth every 4 (four) hours as needed for moderate pain. Qty: 30 tablet, Refills: 0    insulin aspart (NOVOLOG) 100 UNIT/ML injection Inject 0-15 Units into the skin 3 (three) times daily with meals. Sliding scale  CBG 70 - 120: 0 units: CBG 121 - 150: 2 units; CBG 151 - 200: 3 units; CBG 201 - 250: 5  units; CBG 251 - 300: 8 units;CBG 301 - 350: 11 units; CBG 351 - 400: 15 units; CBG > 400 : 15 units and notify MD Qty: 10 mL, Refills: 4      CONTINUE these medications which have CHANGED   Details  insulin detemir (LEVEMIR) 100 UNIT/ML injection Inject 0.25 mLs (25 Units total) into the skin 2 (two) times daily. Qty: 10 mL, Refills: 11      CONTINUE these medications which have NOT CHANGED   Details  albuterol (PROVENTIL HFA;VENTOLIN HFA) 108 (90 Base) MCG/ACT inhaler Inhale 2 puffs into the lungs every 4 (four) hours as needed for wheezing or shortness of breath.    albuterol (PROVENTIL) (5 MG/ML) 0.5% nebulizer solution Take 0.5 mLs (2.5 mg total) by nebulization every 4 (four) hours as needed for wheezing or shortness of breath. Sob Qty: 20 mL, Refills: 5    atorvastatin (LIPITOR) 20 MG tablet Take 1 tablet (20 mg total) by mouth daily at 6 PM. Qty: 30 tablet, Refills: 0    blood glucose meter kit and supplies KIT Dispense based on patient and insurance preference. Use up to four times daily as directed. (FOR ICD-9 250.00, 250.01). Qty: 1 each, Refills: 0    budesonide (PULMICORT) 0.5 MG/2ML nebulizer solution Take 0.5 mg by nebulization every 4 (four) hours.    budesonide-formoterol (SYMBICORT) 80-4.5 MCG/ACT inhaler Take 2 puffs first thing in am and then another 2 puffs about 12 hours later. Qty: 1 Inhaler, Refills: 11    cloNIDine (CATAPRES) 0.1 MG tablet Take 1 tablet (0.1 mg total) by mouth 2 (two)  times daily. Qty: 60 tablet, Refills: 11    diclofenac sodium (VOLTAREN) 1 % GEL Apply 1 application topically 4 (four) times daily. Qty: 100 g, Refills: 0    gabapentin (NEURONTIN) 300 MG capsule Take 1 capsule (300 mg total) by mouth 2 (two) times daily. Qty: 60 capsule, Refills: 5    gemfibrozil (LOPID) 600 MG tablet Take 1 tablet (600 mg total) by mouth 2 (two) times daily before a meal. Qty: 60 tablet, Refills: 12    glucose blood test strip Use as instructed Qty:  120 each, Refills: 0    ibuprofen (ADVIL) 200 MG tablet Take 1-2 tablets (200-400 mg total) by mouth every 6 (six) hours as needed for mild pain or moderate pain. Qty: 30 tablet, Refills: 0    Insulin Pen Needle 29G X 13MM MISC 1 Device by Does not apply route 4 (four) times daily - after meals and at bedtime. Qty: 90 each, Refills: 12    INSULIN SYRINGE 1CC/29G 29G X 1/2" 1 ML MISC 1 Syringe by Does not apply route 4 (four) times daily - after meals and at bedtime. Qty: 90 each, Refills: 0    Lancets MISC 1 Stick by Does not apply route 4 (four) times daily -  with meals and at bedtime. Qty: 120 each, Refills: 0    Multiple Vitamin (MULTIVITAMIN WITH MINERALS) TABS tablet Take 1 tablet by mouth daily.    pantoprazole (PROTONIX) 40 MG tablet Take 1 tablet (40 mg total) by mouth daily. Take 30-60 min before first meal of the day Qty: 30 tablet, Refills: 2    potassium chloride SA (K-DUR,KLOR-CON) 20 MEQ tablet Take 1 tablet (20 mEq total) by mouth daily. Qty: 5 tablet, Refills: 0    Respiratory Therapy Supplies (FLUTTER) DEVI Use as directed Qty: 1 each, Refills: 0      STOP taking these medications     oxyCODONE (ROXICODONE) 5 MG immediate release tablet      predniSONE (DELTASONE) 20 MG tablet          Brief H and P: For complete details please refer to admission H and P, but in brief 45 year old female with history of asthma, alcoholic pancreatitis, DKA admissions, diabetes, presented to the emergency room with complaints of right lower extremity pain and swelling. The patient was febrile with tachycardia, leukocytosis, to keep meal and was admitted for sepsis secondary to right lower extremity cellulitis.  Hospital Course:    Severe Sepsis secondary to right lower extremity cellulitis-Upon admission, patient was febrile with tachycardia, leukocytosis, tachypnea. -Lactic acid level had normalized, leukocytosis trending downward. Hypotension was treated with IV fluids and  Solu-Cortef. CCM was consulted. Solu-Cortef was weaned off   patient was initially placed on vancomycin, cefepime, flagyl- transitioned to Ancef IV, blood cultures remained negative. She was transitioned to oral doxycycline for discharge.  -Gen. surgery was consulted. CT showed soft tissue inflammation, no abcess in the right lower extremity  -  venous Doppler was ordered however patient refused to have the Doppler done and requested to be discharged home. She is aware that the DVT has not been ruled out  Acute kidney injury -Resolved, likely secondary to sepsis  Diabetes mellitus, type II uncontrolled with neuropathy  - Hemoglobin A1c 10.0 on 5/21  -Patient was also placed on insulin drip and then transitioned back to subcutaneous insulin. Continue Levemir, sliding scale insulin at home. Patient will need further adjustment in her insulin regimen outpatient   History of asthma -Supposedly quit smoking 4  days ago (prior to admission), does not appear to be in exacerbation at this time -Continue supplemental oxygen to maintain saturations above 90% -Continue nebulizers as needed  Essential hypertension currently stable   Tobacco abuse -Patient counseled regarding smoking cessation  History of alcoholism and alcoholic induced pancreatitis -Alcohol level currently unremarkable   QT prolongation -Avoid medications with QT prolonging effects  Day of Discharge BP 169/74 mmHg  Pulse 76  Temp(Src) 97.9 F (36.6 C) (Oral)  Resp 18  Ht 5' 4"  (1.626 m)  Wt 102 kg (224 lb 13.9 oz)  BMI 38.58 kg/m2  SpO2 96%  LMP 04/19/2016  Physical Exam: General: Alert and awake oriented x3 not in any acute distress. HEENT: anicteric sclera, pupils reactive to light and accommodation CVS: S1-S2 clear no murmur rubs or gallops Chest: clear to auscultation bilaterally, no wheezing rales or rhonchi Abdomen: soft nontender, nondistended, normal bowel sounds Extremities: no cyanosis, clubbi.  Erythema, edema improving, right lower extremity dressing intact  Neuro: Cranial nerves II-XII intact, no focal neurological deficits   The results of significant diagnostics from this hospitalization (including imaging, microbiology, ancillary and laboratory) are listed below for reference.    LAB RESULTS: Basic Metabolic Panel:  Recent Labs Lab 05/10/16 0558  05/12/16 0358 05/13/16 0612  NA 132*  < > 136 135  K 4.2  < > 3.7 3.2*  CL 97*  < > 105 104  CO2 25  < > 21* 23  GLUCOSE 260*  < > 220* 300*  BUN 8  < > 6 6  CREATININE 0.85  < > 0.53 0.69  CALCIUM 7.2*  < > 7.4* 7.3*  MG 1.3*  --   --   --   PHOS 2.6  --   --   --   < > = values in this interval not displayed. Liver Function Tests:  Recent Labs Lab 05/09/16 2045 05/10/16 0558  AST 27 13*  ALT 13* 10*  ALKPHOS 59 47  BILITOT 1.1 1.1  PROT 7.4 5.8*  ALBUMIN 2.4* 1.8*    Recent Labs Lab 05/09/16 2045  LIPASE 13   No results for input(s): AMMONIA in the last 168 hours. CBC:  Recent Labs Lab 05/09/16 2045  05/12/16 0358 05/13/16 0612  WBC 13.6*  < > 12.4* 9.4  NEUTROABS 8.9*  --   --   --   HGB 13.0  < > 9.5* 9.8*  HCT 39.6  < > 29.4* 30.4*  MCV 89.4  < > 86.7 88.9  PLT 295  < > 297 301  < > = values in this interval not displayed. Cardiac Enzymes: No results for input(s): CKTOTAL, CKMB, CKMBINDEX, TROPONINI in the last 168 hours. BNP: Invalid input(s): POCBNP CBG:  Recent Labs Lab 05/13/16 2220 05/14/16 0807  GLUCAP 298* 281*    Significant Diagnostic Studies:  Dg Chest 2 View  05/09/2016  CLINICAL DATA:  Pain. EXAM: CHEST  2 VIEW COMPARISON:  May 07, 2016 FINDINGS: Mild bibasilar atelectasis. Linear opacity in the lingula is probably atelectatic as well. No pneumothorax. No other interval changes or acute abnormalities. A rounded density in the left hilum is consistent with a vessel on end. No nodule was seen in this location on the recent CT scan. IMPRESSION: Increasing atelectasis in  the lingula with persistent atelectasis in the bases. Electronically Signed   By: Dorise Bullion III M.D   On: 05/09/2016 22:07   Dg Tibia/fibula Right  05/09/2016  CLINICAL DATA:  Pain without reported trauma.  EXAM: RIGHT TIBIA AND FIBULA - 2 VIEW COMPARISON:  None. FINDINGS: Soft tissue edema. Vascular calcifications are seen. No fracture or dislocation. No bony erosion or evidence of osteomyelitis IMPRESSION: Soft tissue edema.  No underlying bony abnormality identified. Electronically Signed   By: Dorise Bullion III M.D   On: 05/09/2016 22:09   Dg Foot Complete Right  05/09/2016  CLINICAL DATA:  45 year old presenting with 2 day history of edema and erythema involving the right lower leg and right foot. Current history of diabetes. EXAM: RIGHT FOOT COMPLETE - 3+ VIEW COMPARISON:  04/29/2016. FINDINGS: No evidence of acute, subacute or healed fractures. Well preserved joint spaces. Well preserved bone mineral density. Small plantar calcaneal spur. Calcification involving the dorsalis pedis artery and the anterior and posterior tibial arteries. IMPRESSION: No acute or significant abnormalities. Electronically Signed   By: Evangeline Dakin M.D.   On: 05/09/2016 22:12    2D ECHO:   Disposition and Follow-up: Discharge Instructions    Diet Carb Modified    Complete by:  As directed      Increase activity slowly    Complete by:  As directed             DISPOSITION: home    DISCHARGE FOLLOW-UP Follow-up Information    Follow up with [facility] On 05/21/2016.   Why:  [facility] appointment on 05/21/16 at 11:15 am with Dr. Jarold Song.   Contact information:   [address] [place] [address] [phone]       Time spent on Discharge: 35 mins   Signed:   RAI,RIPUDEEP M.D. [facility] 05/14/2016, 1:22 PM Pager: [phone]

## 2016-05-15 ENCOUNTER — Telehealth: Payer: Self-pay

## 2016-05-15 LAB — CULTURE, BLOOD (ROUTINE X 2): Culture: NO GROWTH

## 2016-05-15 MED FILL — GABAPENTIN 300 MG CAPSULE: 300 | 30 days supply | Qty: 60 | Fill #3

## 2016-05-15 MED FILL — !LEVEMIR FLEXPEN 100UNITS/M: 100U/ML (3) | 16 days supply | Qty: 6 | Fill #1

## 2016-05-15 MED FILL — !NOVOLOG 100UNITS/ML VIAL: 100/ML | 30 days supply | Qty: 10 | Fill #0

## 2016-05-15 MED FILL — ?DOXYCYCLINE MONO 100 MG TA: 100 | 10 days supply | Qty: 20 | Fill #0

## 2016-05-15 NOTE — Telephone Encounter (Signed)
[facility]itional [facility] Clinic Post-discharge Follow-Up Phone Call:  Date of Discharge: 05/14/16 Principal Discharge Diagnosis(es): Sepsis secondary to right lower extremity cellulitis, acute kidney injury, uncontrolled type II diabetes mellitus Post-discharge Communication: Attempt #1 to reach patient and complete post-discharge follow-up phone call. Call placed to #[phone]; unable to reach patient. HIPPA compliant voicemail left requesting return call. Call completed: No

## 2016-05-19 ENCOUNTER — Ambulatory Visit: Payer: Self-pay | Attending: Family Medicine | Admitting: Physician Assistant

## 2016-05-19 ENCOUNTER — Telehealth: Payer: Self-pay

## 2016-05-19 ENCOUNTER — Emergency Department (HOSPITAL_COMMUNITY)
Admission: EM | Admit: 2016-05-19 | Discharge: 2016-05-19 | Disposition: A | Discharge status: Home / Self Care | Payer: Self-pay | Attending: Emergency Medicine | Admitting: Emergency Medicine

## 2016-05-19 ENCOUNTER — Emergency Department (HOSPITAL_COMMUNITY): Payer: Self-pay

## 2016-05-19 ENCOUNTER — Encounter (HOSPITAL_COMMUNITY): Payer: Self-pay | Admitting: Emergency Medicine

## 2016-05-19 DIAGNOSIS — Z791 Long term (current) use of non-steroidal anti-inflammatories (NSAID): Secondary | ICD-10-CM | POA: Insufficient documentation

## 2016-05-19 DIAGNOSIS — E1165 Type 2 diabetes mellitus with hyperglycemia: Secondary | ICD-10-CM

## 2016-05-19 DIAGNOSIS — Z79899 Other long term (current) drug therapy: Secondary | ICD-10-CM | POA: Insufficient documentation

## 2016-05-19 DIAGNOSIS — R740 Nonspecific elevation of levels of transaminase and lactic acid dehydrogenase [LDH]: Secondary | ICD-10-CM | POA: Insufficient documentation

## 2016-05-19 DIAGNOSIS — Z880 Allergy status to penicillin: Secondary | ICD-10-CM | POA: Insufficient documentation

## 2016-05-19 DIAGNOSIS — J45909 Unspecified asthma, uncomplicated: Secondary | ICD-10-CM | POA: Insufficient documentation

## 2016-05-19 DIAGNOSIS — M79605 Pain in left leg: Secondary | ICD-10-CM | POA: Insufficient documentation

## 2016-05-19 DIAGNOSIS — M79606 Pain in leg, unspecified: Secondary | ICD-10-CM

## 2016-05-19 DIAGNOSIS — Z872 Personal history of diseases of the skin and subcutaneous tissue: Secondary | ICD-10-CM | POA: Insufficient documentation

## 2016-05-19 DIAGNOSIS — F17210 Nicotine dependence, cigarettes, uncomplicated: Secondary | ICD-10-CM | POA: Insufficient documentation

## 2016-05-19 DIAGNOSIS — E118 Type 2 diabetes mellitus with unspecified complications: Secondary | ICD-10-CM | POA: Insufficient documentation

## 2016-05-19 DIAGNOSIS — R7989 Other specified abnormal findings of blood chemistry: Secondary | ICD-10-CM

## 2016-05-19 DIAGNOSIS — L03115 Cellulitis of right lower limb: Secondary | ICD-10-CM | POA: Insufficient documentation

## 2016-05-19 DIAGNOSIS — Z794 Long term (current) use of insulin: Secondary | ICD-10-CM | POA: Insufficient documentation

## 2016-05-19 LAB — COMPREHENSIVE METABOLIC PANEL
ALBUMIN: 2.6 g/dL — AB (ref 3.5–5.0)
ALT: 18 U/L (ref 14–54)
ANION GAP: 6 (ref 5–15)
AST: 22 U/L (ref 15–41)
Alkaline Phosphatase: 55 U/L (ref 38–126)
BUN: <5 mg/dL — ABNORMAL LOW (ref 6–20)
CO2: 27 mmol/L (ref 22–32)
Calcium: 9.2 mg/dL (ref 8.9–10.3)
Chloride: 99 mmol/L — ABNORMAL LOW (ref 101–111)
Creatinine, Ser: <0.3 mg/dL — ABNORMAL LOW (ref 0.44–1.00)
GLUCOSE: 288 mg/dL — AB (ref 65–99)
POTASSIUM: 4.9 mmol/L (ref 3.5–5.1)
SODIUM: 132 mmol/L — AB (ref 135–145)
Total Bilirubin: 0.5 mg/dL (ref 0.3–1.2)
Total Protein: 6.6 g/dL (ref 6.5–8.1)

## 2016-05-19 LAB — CBC WITH DIFFERENTIAL/PLATELET
BASOS ABS: 0 10*3/uL (ref 0.0–0.1)
BASOS PCT: 0 %
EOS ABS: 0 10*3/uL (ref 0.0–0.7)
Eosinophils Relative: 0 %
HCT: 36.2 % (ref 36.0–46.0)
HEMOGLOBIN: 11.8 g/dL — AB (ref 12.0–15.0)
Lymphocytes Relative: 30 %
Lymphs Abs: 2.3 10*3/uL (ref 0.7–4.0)
MCH: 28.6 pg (ref 26.0–34.0)
MCHC: 32.6 g/dL (ref 30.0–36.0)
MCV: 87.9 fL (ref 78.0–100.0)
MONOS PCT: 4 %
Monocytes Absolute: 0.3 10*3/uL (ref 0.1–1.0)
NEUTROS PCT: 66 %
Neutro Abs: 5 10*3/uL (ref 1.7–7.7)
Platelets: 329 10*3/uL (ref 150–400)
RBC: 4.12 MIL/uL (ref 3.87–5.11)
RDW: 14.5 % (ref 11.5–15.5)
WBC: 7.6 10*3/uL (ref 4.0–10.5)

## 2016-05-19 LAB — URINALYSIS, ROUTINE W REFLEX MICROSCOPIC
BILIRUBIN URINE: NEGATIVE
Glucose, UA: >1000 mg/dL — AB
Hgb urine dipstick: NEGATIVE
KETONES UR: NEGATIVE mg/dL
NITRITE: NEGATIVE
Protein, ur: NEGATIVE mg/dL
SPECIFIC GRAVITY, URINE: 1.02 (ref 1.005–1.030)
pH: 7.5 (ref 5.0–8.0)

## 2016-05-19 LAB — URINE MICROSCOPIC-ADD ON
Bacteria, UA: NONE SEEN
RBC / HPF: NONE SEEN RBC/hpf (ref 0–5)
WBC, UA: NONE SEEN WBC/hpf (ref 0–5)

## 2016-05-19 LAB — I-STAT BETA HCG BLOOD, ED (MC, WL, AP ONLY): I-stat hCG, quantitative: <5 m[IU]/mL (ref <5)

## 2016-05-19 LAB — GLUCOSE, POCT (MANUAL RESULT ENTRY): POC GLUCOSE: 430 mg/dL — AB (ref 70–99)

## 2016-05-19 LAB — I-STAT CG4 LACTIC ACID, ED
LACTIC ACID, VENOUS: 2.29 mmol/L — AB (ref 0.5–2.0)
LACTIC ACID, VENOUS: 1.41 mmol/L (ref 0.5–2.0)

## 2016-05-19 LAB — CBG MONITORING, ED: GLUCOSE-CAPILLARY: 277 mg/dL — AB (ref 65–99)

## 2016-05-19 MED ORDER — SODIUM CHLORIDE 0.9 % IV BOLUS (SEPSIS)
1000.0000 mL | Freq: Once | INTRAVENOUS | Status: AC
  Administered 2016-05-19: 1000 mL via INTRAVENOUS

## 2016-05-19 MED ORDER — INSULIN ASPART 100 UNIT/ML ~~LOC~~ SOLN
20.0000 [IU] | Freq: Once | SUBCUTANEOUS | Status: AC
  Administered 2016-05-19: 20 [IU] via SUBCUTANEOUS

## 2016-05-19 MED ORDER — OXYCODONE-ACETAMINOPHEN 5-325 MG PO TABS
1.0000 | ORAL_TABLET | Freq: Once | ORAL | Status: AC
  Administered 2016-05-19: 1 via ORAL
  Filled 2016-05-19: qty 1

## 2016-05-19 MED ORDER — KETOROLAC TROMETHAMINE 30 MG/ML IJ SOLN
30.0000 mg | Freq: Once | INTRAMUSCULAR | Status: AC
  Administered 2016-05-19: 30 mg via INTRAVENOUS
  Filled 2016-05-19 (×2): qty 1

## 2016-05-19 NOTE — ED Notes (Signed)
Pt requesting pain medication. Dr. Ranae Palms aware.

## 2016-05-19 NOTE — Telephone Encounter (Signed)
[facility] Clinic Post-discharge Follow-Up Phone Call:  Date of Discharge: 05/14/2016 Principal Discharge Diagnosis(es): sepsis secondary to RLE cellulitis Post-discharge Communication: (Clearly document all attempts clearly and date contact made) Call placed to the patient Call Completed: yes                   With Whom: Patient Interpreter Needed: No     Please check all that apply:  X  Patient is knowledgeable of his/her condition(s) and/or treatment. The patient stated that she is doing " pretty good."   X  Patient is caring for self at home.  ? Patient is receiving assist at home from family and/or caregiver. Family and/or caregiver is knowledgeable of patient's condition(s) and/or treatment. ? Patient is receiving home health services. If so, name of agency.     Medication Reconciliation:  ? Medication list reviewed with patient. X Patient obtained all discharge medications. If not, why? She said that she now has all of her medications except for the norco which she stated that she could not afford. She said that it is $62 and she will have to wait to get it.  This CM offered to review her medication list with her and she said that she did not need to review it because she knows all of her medications and is taking them as ordered. She said that she understands her sliding scale and has a glucometer and has been checking her blood sugars. She noted that her blood sugar this morning was 312 and she took her novolog as her the sliding scale. She said that she is taking the levemir 25 units twice a day. Instructed her that as her her discharge summary, she is to stop taking the prednisone. She said that she understands that the prednisone is not on her discharge medication list; but she believes that she still needs to take it. Message sent to Dr Venetia Night  Informing her that the patient believes that she should continue to take her prednisone.    Activities of Daily Living:  X   Independent ? Needs assist (describe; ? home DME used) ? [facility] (describe, ? home DME used)   Community resources in place for patient:  X  None  - She said that home health care was not ordered.  ? Home Health/Home DME ? Assisted Living ? Support Group          Patient Education: The patient said that she has food; but not necessarily the food that she is " supposed to have."  She said that she knows that she needs to go to DSS to inquire about medicaid and food stamps eligibility.  She noted that her son has been able to provide transportation for her.  She stated the home health care was not ordered and she was not instructed how to do the wound care for her right lower extremity and she is "afraid" to touch it. She said that there is now a foul odor from the dressing. She did not report any fever and confirmed that she is taking her antibiotic.  This CM spoke to Dr Venetia Night to provide an update on the wound status and then to Charlett Lango, [facility] Practice Manager regarding scheduling an appointment for the patient to be seen in the clinic to have the wound assessed. An appointment was scheduled for today, 05/19/16 @ 1430 with Georgian Co, PA.  The patient said that she has transportation to the clinic and will be at her  appointment. She is also aware that she has an appointment at the [facility] at [facility] on 05/21/16 @ 1115 with Dr Venetia Night.         Questions/Concerns discussed: No other questions/concerns reported at this time.

## 2016-05-19 NOTE — ED Notes (Addendum)
Pt leaving before this RN could go over her discharge instructions with her or obtain discharge vital signs but pt was instructed to return tomorrow morning to have doppler study to rule out DVT. Pt stated she understood and would come back in the morning.

## 2016-05-19 NOTE — Discharge Instructions (Signed)
RETURN TO ED FOR DVT SCAN OF RIGHT LEG (RULE OUT CLOT)

## 2016-05-19 NOTE — ED Provider Notes (Signed)
CSN: [id number]     Arrival date & time 05/19/16  1553 History   First MD Initiated Contact with Patient 05/19/16 1855     Chief Complaint  Patient presents with  . Fever  . Hyperglycemia     Patient is a 45 y.o. female presenting with fever and hyperglycemia.  Fever Associated symptoms: chills   Associated symptoms: no chest pain, no cough, no nausea, no rash and no vomiting   Hyperglycemia Associated symptoms: fever   Associated symptoms: no abdominal pain, no chest pain, no nausea, no shortness of breath, no vomiting and no weakness    Tmax 104 at home in past 48 hours.  Associated with fc and right leg pain.  BG 430, given 20 units novolog.  BP was 88/65 per pt, notes say lowest 99/74/    Recent admission for cellulitis, hx of MRSA.  On doxycycline.  Dc 5 days ago.  Hasnt looked at wounds since dishcarge, says it is worse when they looked at it at PCP.  Sent by PCP.  More swollen, no hx of DVT or PE.  Was supposed to get DVT study but did not happened bc she wanted to leave.  Had CT leg without superficial inflammatory changes, no abscess or free air.  Fever earlier today.  Did not take any Tylenol or antipyretics.   No dysuria. No CP or SOB.   Compliant with insulin.  No recent changes.  UOP less than normal.  No polydispsia.     Past Medical History  Diagnosis Date  . Diabetes mellitus without complication ([place])   . Asthma    Past Surgical History  Procedure Laterality Date  . Cesarean section     Family History  Problem Relation Age of Onset  . Emphysema Mother     smoked  . Allergies Mother   . Asthma Mother   . Breast cancer Mother   . Uterine cancer Mother   . Heart disease Maternal Grandmother   . Diabetes Father    Social History  Substance Use Topics  . Smoking status: Current Every Day Smoker -- 0.50 packs/day for 25 years    Types: Cigarettes    Last Attempt to Quit: 12/22/2011  . Smokeless tobacco: Never Used  . Alcohol Use: No     Comment: states  quit in May 2017   OB History    Gravida Para Term Preterm AB TAB SAB Ectopic Multiple Living   5 4 4       4      Review of Systems  Constitutional: Positive for fever and chills. Negative for activity change.  Respiratory: Negative for cough, shortness of breath and wheezing.   Cardiovascular: Negative for chest pain.  Gastrointestinal: Negative for nausea, vomiting and abdominal pain.  Musculoskeletal: Negative for back pain.  Skin: Positive for wound. Negative for rash. Color change: ?  Neurological: Negative for weakness and numbness.  All other systems reviewed and are negative.     Allergies  Penicillins and Robitussin liquid center  Home Medications   Prior to Admission medications   Medication Sig Start Date End Date Taking? Authorizing Provider  albuterol (PROVENTIL HFA;VENTOLIN HFA) 108 (90 Base) MCG/ACT inhaler Inhale 2 puffs into the lungs every 4 (four) hours as needed for wheezing or shortness of breath.   Yes Historical Provider, MD  albuterol (PROVENTIL) (5 MG/ML) 0.5% nebulizer solution Take 0.5 mLs (2.5 mg total) by nebulization every 4 (four) hours as needed for wheezing or shortness of breath. Sob  Patient taking differently: Take 2.5 mg by nebulization every 4 (four) hours. Sob 04/24/15  Yes Micheline Chapman, NP  atorvastatin (LIPITOR) 20 MG tablet Take 1 tablet (20 mg total) by mouth daily at 6 PM. 04/21/16  Yes Nita Sells, MD  blood glucose meter kit and supplies KIT Dispense based on patient and insurance preference. Use up to four times daily as directed. (FOR ICD-9 250.00, 250.01). 05/01/16  Yes Cherene Altes, MD  budesonide (PULMICORT) 0.5 MG/2ML nebulizer solution Take 0.5 mg by nebulization every 4 (four) hours.   Yes Historical Provider, MD  budesonide-formoterol (SYMBICORT) 80-4.5 MCG/ACT inhaler Take 2 puffs first thing in am and then another 2 puffs about 12 hours later. 04/21/16  Yes Nita Sells, MD  cloNIDine (CATAPRES) 0.1 MG tablet  Take 1 tablet (0.1 mg total) by mouth 2 (two) times daily. 04/21/16  Yes Nita Sells, MD  diclofenac sodium (VOLTAREN) 1 % GEL Apply 1 application topically 4 (four) times daily. Patient taking differently: Apply 1 application topically 4 (four) times daily as needed (pain.).  05/30/15  Yes Janne Napoleon, NP  doxycycline (VIBRA-TABS) 100 MG tablet Take 1 tablet (100 mg total) by mouth 2 (two) times daily. 05/14/16  Yes Ripudeep Krystal Eaton, MD  gabapentin (NEURONTIN) 300 MG capsule Take 1 capsule (300 mg total) by mouth 2 (two) times daily. 09/18/15  Yes Tiffany Daneil Dan, PA-C  gemfibrozil (LOPID) 600 MG tablet Take 1 tablet (600 mg total) by mouth 2 (two) times daily before a meal. 04/21/16  Yes Nita Sells, MD  glucose blood test strip Use as instructed 05/01/16  Yes Cherene Altes, MD  glucose blood test strip As directed 05/01/16  Yes Historical Provider, MD  HYDROcodone-acetaminophen (NORCO/VICODIN) 5-325 MG tablet Take 1-2 tablets by mouth every 4 (four) hours as needed for moderate pain. 05/14/16  Yes Ripudeep Krystal Eaton, MD  ibuprofen (ADVIL) 200 MG tablet Take 1-2 tablets (200-400 mg total) by mouth every 6 (six) hours as needed for mild pain or moderate pain. 05/01/16  Yes Cherene Altes, MD  insulin aspart (NOVOLOG) 100 UNIT/ML injection Inject 0-15 Units into the skin 3 (three) times daily with meals. Sliding scale  CBG 70 - 120: 0 units: CBG 121 - 150: 2 units; CBG 151 - 200: 3 units; CBG 201 - 250: 5 units; CBG 251 - 300: 8 units;CBG 301 - 350: 11 units; CBG 351 - 400: 15 units; CBG > 400 : 15 units and notify MD 05/14/16  Yes Ripudeep K Rai, MD  insulin detemir (LEVEMIR) 100 UNIT/ML injection Inject 0.25 mLs (25 Units total) into the skin 2 (two) times daily. 05/14/16  Yes Ripudeep Krystal Eaton, MD  Insulin Pen Needle 29G X 13MM MISC 1 Device by Does not apply route 4 (four) times daily - after meals and at bedtime. 04/21/16  Yes Nita Sells, MD  INSULIN SYRINGE 1CC/29G 29G X 1/2" 1 ML MISC 1  Syringe by Does not apply route 4 (four) times daily - after meals and at bedtime. 04/21/16  Yes Nita Sells, MD  Lancets MISC 1 Stick by Does not apply route 4 (four) times daily -  with meals and at bedtime. 05/01/16  Yes Cherene Altes, MD  Multiple Vitamin (MULTIVITAMIN WITH MINERALS) TABS tablet Take 1 tablet by mouth daily.   Yes Historical Provider, MD  oxyCODONE (OXY IR/ROXICODONE) 5 MG immediate release tablet Take 5 mg by mouth every 6 (six) hours as needed for moderate pain or severe pain.  Yes Historical Provider, MD  pantoprazole (PROTONIX) 40 MG tablet Take 1 tablet (40 mg total) by mouth daily. Take 30-60 min before first meal of the day 09/18/15  Yes Tiffany S Noel, PA-C  potassium chloride SA (K-DUR,KLOR-CON) 20 MEQ tablet Take 1 tablet (20 mEq total) by mouth daily. 05/07/16  Yes Deno Etienne, DO  predniSONE (DELTASONE) 20 MG tablet Take 20 mg by mouth daily. 04/21/16  Yes Historical Provider, MD  Respiratory Therapy Supplies (FLUTTER) DEVI Use as directed 12/27/15  Yes Tanda Rockers, MD   BP 122/88 mmHg  Pulse 96  Temp(Src) 98.3 F (36.8 C) (Oral)  Resp 20  SpO2 97%  LMP 04/19/2016 Physical Exam  Constitutional: She is oriented to person, place, and time. She appears well-developed and well-nourished. No distress.  Obese,  Ordering onion rings and a large sweet tea on the phone, "Im hungry"  HENT:  Head: Normocephalic and atraumatic.  Nose: Nose normal.  Mouth/Throat: No oropharyngeal exudate.  Eyes: Conjunctivae are normal.  Neck: Normal range of motion. Neck supple. No tracheal deviation present.  Cardiovascular: Normal rate, regular rhythm and normal heart sounds.   No murmur heard. Strong DPs.   Pulmonary/Chest: Effort normal and breath sounds normal. No respiratory distress. She has no rales.  Abdominal: Soft. Bowel sounds are normal. She exhibits no distension and no mass. There is no tenderness.  Neg cvat  Musculoskeletal: Normal range of motion. She exhibits  edema (right lower pitting to knee. ) and tenderness (hypersensitive to right leg).  Neurological: She is alert and oriented to person, place, and time.  SENSORY:  sensation is intact to light touch in:  superficial peroneal nerve distribution (over dorsum of foot) deep peroneal nerve distribution (over first dorsal web space) sural nerve distribution (over lateral aspect 5th metatarsal) saphenous nerve distribution (over medial instep)  MOTOR:  + motor EHL (great toe dorsiflexion) + FHL (great toe plantar flexion)  + TA (ankle dorsiflexion)  + GSC (ankle plantar flexion)   Normal gait without limp  Skin: Skin is warm and dry. No rash noted.  Scattered bruising to right lower leg.  Desquamation of most superficial skin noted at medial aspect.  No purulence. No fluctuance. No spreading erythema (almost resolved), no spreading outside previous wound markings.  No warmth.   Psychiatric: She has a normal mood and affect.  Nursing note and vitals reviewed.   ED Course  Procedures (including critical care time) Labs Review Labs Reviewed  COMPREHENSIVE METABOLIC PANEL - Abnormal; Notable for the following:    Sodium 132 (*)    Chloride 99 (*)    Glucose, Bld 288 (*)    BUN <5 (*)    Creatinine, Ser <0.30 (*)    Albumin 2.6 (*)    All other components within normal limits  CBC WITH DIFFERENTIAL/PLATELET - Abnormal; Notable for the following:    Hemoglobin 11.8 (*)    All other components within normal limits  URINALYSIS, ROUTINE W REFLEX MICROSCOPIC (NOT AT [facility]) - Abnormal; Notable for the following:    APPearance CLOUDY (*)    Glucose, UA >1000 (*)    Leukocytes, UA SMALL (*)    All other components within normal limits  URINE MICROSCOPIC-ADD ON - Abnormal; Notable for the following:    Squamous Epithelial / LPF 6-30 (*)    All other components within normal limits  I-STAT CG4 LACTIC ACID, ED - Abnormal; Notable for the following:    Lactic Acid, Venous 2.29 (*)    All  other  components within normal limits  CBG MONITORING, ED - Abnormal; Notable for the following:    Glucose-Capillary 277 (*)    All other components within normal limits  CULTURE, BLOOD (ROUTINE X 2)  CULTURE, BLOOD (ROUTINE X 2)  URINE CULTURE  I-STAT BETA HCG BLOOD, ED ([facility], [facility], [facility] ONLY)  I-STAT CG4 LACTIC ACID, ED  I-STAT CG4 LACTIC ACID, ED  I-STAT CG4 LACTIC ACID, ED    Imaging Review Dg Chest 2 View  05/19/2016  CLINICAL DATA:  Fever, hyperglycemia and hypertension. Recent history of sepsis. EXAM: CHEST  2 VIEW COMPARISON:  03/09/2016 FINDINGS: Borderline cardiomegaly without CHF or pneumonia. Improvement in the bibasilar atelectasis. No focal pneumonia, collapse or consolidation. No significant effusion or pneumothorax. Trachea is midline. IMPRESSION: Mild cardiomegaly without CHF or pneumonia Resolving bibasilar atelectasis. Electronically Signed   By: Jerilynn Mages.  Shick M.D.   On: 05/19/2016 17:12   I have personally reviewed and evaluated these images and lab results as part of my medical decision-making.   EKG Interpretation None      MDM   Final diagnoses:  Leg pain  Elevated lactic acid level  History of cellulitis    Patient presents for further evaluation of right lower leg pain. I reviewed recent hospital course demonstrating treatment for cellulitis of the right lower extremity. An ultrasound was not performed time and was ordered, however we're unable to perform at this hour and she will return in AM for DVT ultrasound.  No hx of PE/DVT.  Vitals stable, no hypoxia, fever, tachycardia, normotensive during entire ED stay.  Right lower extremity appears in various stages of healing. Marked edges from recent course still present and erythema all but resolved. Edema still persists with pain.  No purulence. No fluctuance or focal swelling to indicate abscess.  Vascular intact. LA mildly elevated, improved with IVF.  WBC normal.  Counseled on wound care, gave resources to assist in wound  management and discussed importance of glucose control.  Not in DKA here.  Pt remained very well appearing during my care, ordered onion rings, sweet tea and [employer].  Wounds were re-dressed and cleaned.    Patient requested narcotic medication mobile times throughout ED course despite 1 dose of Percocet provided. Offered Toradol to help with her inflammatory pain. Patient was quite consulted by the suggestion of anti-inflammatories. She demanded a refill of her narcotic medication. She has a current prescription for narcotics at this time I do not feel comfortable refilling his prescription and would prefer she have PCP follow-up for her pain management needs. Patient even stated that she had taken multiple doses for narcotics during her ED course without notified staff because she was only one that would treat her pain appropriately. Patient became verbally aggressive with this provider claiming that I was being racist, I was incompetent and should not be a doctor and will file a complaint.  I repeatedly explained my concerns for her safety, risk of dependence and hesitations in refilling her medications for which she already has a bottle of. I discussed multiple alternative analgesic therapies but she claimed my options did not meet her needs. I also noted I was being videotaped without my knowledge by a relative during the encounter and explained hospital policy that prohibits such.  Pt was discharged in stable condition with plan to return in AM. Ambulated on affected extremity without issue.    Tammy Sours, MD 05/19/16 2703  Julianne Rice, MD 05/20/16 [phone]

## 2016-05-19 NOTE — ED Notes (Signed)
Pt requesting prescription for pain medication. Dr. Edman Circle at bedside speaking to patient.

## 2016-05-19 NOTE — Patient Instructions (Signed)
Recurrent high fever (102-104) 48 hrs after hospital discharge Uncontrolled diabetes.  20 units Novolog given 3:15pm Hypotension Recent admission  TO ED to eval and treat-R/O sepsis

## 2016-05-19 NOTE — ED Notes (Signed)
Pt here from PCP for eval for fever, hyperglycemia and hypotension; pt with hx of recent sepsis

## 2016-05-19 NOTE — ED Notes (Signed)
Pt very upset that Dr. Ranae Palms and Edman Circle will be prescribe her any pain medication.pt A&OX4, NAD at discharge. This RN apologized to patient and visitors.

## 2016-05-19 NOTE — Progress Notes (Signed)
Patient ID: Carla Hicks, female   DOB: Jan 31, 1971, 45 y.o.   MRN: [id number]   Carla Hicks, is a 45 y.o. female  DGU:[id number]  ZDG:[id number]  DOB - 11-23-1971  No chief complaint on file.       Subjective:  Chief Complaint and HPI: Carla Hicks is a 45 y.o. female here today for cellulitis recheck.  She was hospitalized 05/09/2016-05/14/2016 for sepsis and RLL cellulitis.  48hours after discharge, she started having fevers to 102.  Yesterday she had a fever of 104.  She is compliant on the Doxycycline she was sent home on.  She says she is compliant on her diabetes medications.  She is feeling worse and worse again since hospital discharge.  She has an appointment to see Dr. Jarold Song 2 days from now. She says her RLL is painful and numb sometimes.   She was instructed not to change the dressing.  She thinks home health was supposed to be ordered but no one ever called her.   ED/Hospital notes reviewed.    ROS:   Constitutional:  + f/c, No night sweats, No unexplained weight loss. EENT:  No vision changes, No blurry vision, No hearing changes. No mouth, throat, or ear problems.  Respiratory: No cough, No SOB Cardiac: No CP, no palpitations GI:  No abd pain, No N/V/D. GU: No Urinary s/sx Musculoskeletal: RLL pain and all over body aches Neuro: No headache, no dizziness, no motor weakness.  Skin: No rash Endocrine:  No polydipsia. No polyuria.  Psych: Denies SI/HI  No problems updated.  ALLERGIES: Allergies  Allergen Reactions  . Penicillins Hives    Has patient had a PCN reaction causing immediate rash, facial/tongue/throat swelling, SOB or lightheadedness with hypotension: yes Has patient had a PCN reaction causing severe rash involving mucus membranes or skin necrosis: no Has patient had a PCN reaction that required hospitalization no Has patient had a PCN reaction occurring within the last 10 years: no If all of the above answers are "NO", then may proceed with  Cephalosporin use.   . [place] [Menthol] Hives    PAST MEDICAL HISTORY: Past Medical History  Diagnosis Date  . Diabetes mellitus without complication ([place])   . Asthma     MEDICATIONS AT HOME: Prior to Admission medications   Medication Sig Start Date End Date Taking? Authorizing Provider  albuterol (PROVENTIL HFA;VENTOLIN HFA) 108 (90 Base) MCG/ACT inhaler Inhale 2 puffs into the lungs every 4 (four) hours as needed for wheezing or shortness of breath.   Yes Historical Provider, MD  albuterol (PROVENTIL) (5 MG/ML) 0.5% nebulizer solution Take 0.5 mLs (2.5 mg total) by nebulization every 4 (four) hours as needed for wheezing or shortness of breath. Sob Patient taking differently: Take 2.5 mg by nebulization every 4 (four) hours. Sob 04/24/15  Yes Micheline Chapman, NP  atorvastatin (LIPITOR) 20 MG tablet Take 1 tablet (20 mg total) by mouth daily at 6 PM. 04/21/16  Yes Nita Sells, MD  blood glucose meter kit and supplies KIT Dispense based on patient and insurance preference. Use up to four times daily as directed. (FOR ICD-9 250.00, 250.01). 05/01/16  Yes Cherene Altes, MD  budesonide (PULMICORT) 0.5 MG/2ML nebulizer solution Take 0.5 mg by nebulization every 4 (four) hours.   Yes Historical Provider, MD  budesonide-formoterol (SYMBICORT) 80-4.5 MCG/ACT inhaler Take 2 puffs first thing in am and then another 2 puffs about 12 hours later. 04/21/16  Yes Nita Sells, MD  cloNIDine (CATAPRES) 0.1 MG  tablet Take 1 tablet (0.1 mg total) by mouth 2 (two) times daily. 04/21/16  Yes Nita Sells, MD  diclofenac sodium (VOLTAREN) 1 % GEL Apply 1 application topically 4 (four) times daily. Patient taking differently: Apply 1 application topically 4 (four) times daily as needed (pain.).  05/30/15  Yes Janne Napoleon, NP  doxycycline (VIBRA-TABS) 100 MG tablet Take 1 tablet (100 mg total) by mouth 2 (two) times daily. 05/14/16  Yes Ripudeep Krystal Eaton, MD  gabapentin (NEURONTIN)  300 MG capsule Take 1 capsule (300 mg total) by mouth 2 (two) times daily. 09/18/15  Yes Tiffany Daneil Dan, PA-C  gemfibrozil (LOPID) 600 MG tablet Take 1 tablet (600 mg total) by mouth 2 (two) times daily before a meal. 04/21/16  Yes Nita Sells, MD  glucose blood test strip Use as instructed 05/01/16  Yes Cherene Altes, MD  HYDROcodone-acetaminophen (NORCO/VICODIN) 5-325 MG tablet Take 1-2 tablets by mouth every 4 (four) hours as needed for moderate pain. 05/14/16  Yes Ripudeep Krystal Eaton, MD  ibuprofen (ADVIL) 200 MG tablet Take 1-2 tablets (200-400 mg total) by mouth every 6 (six) hours as needed for mild pain or moderate pain. 05/01/16  Yes Cherene Altes, MD  insulin aspart (NOVOLOG) 100 UNIT/ML injection Inject 0-15 Units into the skin 3 (three) times daily with meals. Sliding scale  CBG 70 - 120: 0 units: CBG 121 - 150: 2 units; CBG 151 - 200: 3 units; CBG 201 - 250: 5 units; CBG 251 - 300: 8 units;CBG 301 - 350: 11 units; CBG 351 - 400: 15 units; CBG > 400 : 15 units and notify MD 05/14/16  Yes Ripudeep K Rai, MD  insulin detemir (LEVEMIR) 100 UNIT/ML injection Inject 0.25 mLs (25 Units total) into the skin 2 (two) times daily. 05/14/16  Yes Ripudeep Krystal Eaton, MD  Insulin Pen Needle 29G X 13MM MISC 1 Device by Does not apply route 4 (four) times daily - after meals and at bedtime. 04/21/16  Yes Nita Sells, MD  INSULIN SYRINGE 1CC/29G 29G X 1/2" 1 ML MISC 1 Syringe by Does not apply route 4 (four) times daily - after meals and at bedtime. 04/21/16  Yes Nita Sells, MD  Lancets MISC 1 Stick by Does not apply route 4 (four) times daily -  with meals and at bedtime. 05/01/16  Yes Cherene Altes, MD  Multiple Vitamin (MULTIVITAMIN WITH MINERALS) TABS tablet Take 1 tablet by mouth daily.   Yes Historical Provider, MD  pantoprazole (PROTONIX) 40 MG tablet Take 1 tablet (40 mg total) by mouth daily. Take 30-60 min before first meal of the day 09/18/15  Yes Tiffany S Noel, PA-C  potassium  chloride SA (K-DUR,KLOR-CON) 20 MEQ tablet Take 1 tablet (20 mEq total) by mouth daily. 05/07/16  Yes Deno Etienne, DO  Respiratory Therapy Supplies (FLUTTER) DEVI Use as directed Patient not taking: Reported on 05/19/2016 12/27/15   Tanda Rockers, MD     Objective:  Jasmine DecemberDanley Danker Vitals:   05/19/16 1441 05/19/16 1442  BP:  99/74  Pulse:  82  Temp:  98.1 F (36.7 C)  TempSrc:  Oral  Height: _0  (1.626 m)   Weight: 233 lb 6.4 oz (105.87 kg)   SpO2:  95%    General appearance : A&OX3. NAD. Non-toxic-appearing HEENT: Atraumatic and Normocephalic.  PERRLA. EOM intact.  TM clear B. Mouth-MMM, post pharynx WNL w/o erythema, No PND. Neck: supple, no JVD. No cervical lymphadenopathy. No thyromegaly Chest/Lungs:  Breathing-non-labored,  Good air entry bilaterally, breath sounds normal without rales, rhonchi, or wheezing  CVS: S1 S2 regular, no murmurs, gallops, rubs  RLL with desquamation of skin from about 4cm below the knee. Ace araps, xeroform and curlex removed.  There is erythema contained within the marking area that was marked in the hospital.  The erythema does not extend beyond the marks.  There is no induration or area of fluctuance.  DP pulse=B. New dressings applied.  Neurology:  CN II-XII grossly intact, Non focal.   Psych:  TP linear. J/I WNL. Normal speech. Appropriate eye contact and affect.  Skin:  No Rash  Data Review Lab Results  Component Value Date   HGBA1C 10.0* 05/10/2016   HGBA1C 9.7* 04/29/2016   HGBA1C 7.0* 12/16/2015     Assessment & Plan   1. Cellulitis of right lower extremity And likely sepsis. Recurrent fever started 48hrs after discharge.  She is feeling progressively worse.  Blood sugar is uncontrolled.  She is hypotensive but currently stable enough for her daughter to drive her across the street to the ED.  She is likely going to need more IV antibiotics and inpatient care.   2. Uncontrolled type 2 diabetes mellitus with complication, unspecified  long term insulin use status (HCC) - insulin aspart (novoLOG) injection 20 Units; Inject 0.2 mLs (20 Units total) into the skin once. - Glucose (CBG) She was unable to get a urine specimen while here. To ED. Assess for ketones there. Discussed case with Dr. Jarold Song.   Patient have been counseled extensively about nutrition and exercise  Keep f/up appt in 48hrs with Dr. Jarold Song; if she is admitted this can be rescheduled appropriately.   The patient was given clear instructions to go to ER or return to medical center if symptoms don't improve, worsen or new problems develop. The patient verbalized understanding. The patient was told to call to get lab results if they haven't heard anything in the next week.     Freeman Caldron, PA-C [facility] and [place], [place]   05/19/2016, 3:39 PM

## 2016-05-19 NOTE — Progress Notes (Signed)
Hospital f/u on cellulitits

## 2016-05-20 ENCOUNTER — Telehealth: Payer: Self-pay

## 2016-05-20 NOTE — Telephone Encounter (Signed)
Call placed to the patient to check on her status and to confirm her appointment at the [facility] at [facility] tomorrow, 05/21/16 2 1115. She stated that she would be at her appointment and she has transportation to the clinic.  This CM inquired if she followed up at the ED this morning for an ultrasound of her RLE as instructed on her AVS last night. She said that she went to the ED this morning and was told that she was too early. She stated that she is keeping her appointment at [facility] tomorrow and will discuss the need for the ultrasound with her doctor at the appointment. She does not plan on returning to the ED later today.  She reported that overall she is feeling " pretty good," no fever, only aching of her right leg.  She stated that the dressing on her RLE was changed when she was in the ED and she was instructed how to change the dressing every day and was given enough supplies for 2 days. She said that she has vaseline gauze , packages of gauze and a " wrap."  Instructed her to bring the wound care supplies and blood sugar log to her appointment and she said that she would. She reported that her blood sugar fasting this morning was 318 and then before lunch it was 181. She noted that she is taking her medications/insulin as ordered.   She inquired about the food pantry at [facility] and is interested in obtaining food if she is eligible.  She said that she will discuss in more detail at her appointment tomorrow.   The patient reported no other problems/questions at this time.   Charlett Lango, [facility] Practice Manager notified of the patient's interest in the food pantry program as well as the patient's need for wound care supplies.

## 2016-05-21 ENCOUNTER — Encounter: Payer: Self-pay | Admitting: Family Medicine

## 2016-05-21 ENCOUNTER — Ambulatory Visit: Payer: Self-pay | Attending: Family Medicine | Admitting: Family Medicine

## 2016-05-21 DIAGNOSIS — Z6841 Body Mass Index (BMI) 40.0 and over, adult: Secondary | ICD-10-CM | POA: Insufficient documentation

## 2016-05-21 DIAGNOSIS — Z79899 Other long term (current) drug therapy: Secondary | ICD-10-CM | POA: Insufficient documentation

## 2016-05-21 DIAGNOSIS — Z794 Long term (current) use of insulin: Secondary | ICD-10-CM | POA: Insufficient documentation

## 2016-05-21 DIAGNOSIS — E1165 Type 2 diabetes mellitus with hyperglycemia: Secondary | ICD-10-CM | POA: Insufficient documentation

## 2016-05-21 DIAGNOSIS — E118 Type 2 diabetes mellitus with unspecified complications: Secondary | ICD-10-CM

## 2016-05-21 DIAGNOSIS — L03115 Cellulitis of right lower limb: Secondary | ICD-10-CM | POA: Insufficient documentation

## 2016-05-21 DIAGNOSIS — I10 Essential (primary) hypertension: Secondary | ICD-10-CM | POA: Insufficient documentation

## 2016-05-21 LAB — GLUCOSE, POCT (MANUAL RESULT ENTRY): POC GLUCOSE: 197 mg/dL — AB (ref 70–99)

## 2016-05-21 LAB — URINE CULTURE

## 2016-05-21 MED ORDER — INSULIN DETEMIR 100 UNIT/ML ~~LOC~~ SOLN: 30.0000 [IU] | Freq: Two times a day (BID) | SUBCUTANEOUS | Status: DC

## 2016-05-21 NOTE — Patient Instructions (Signed)

## 2016-05-21 NOTE — Progress Notes (Signed)
Subjective:  Patient ID: Carla Hicks, female    DOB: 02-06-1971  Age: 45 y.o. MRN: [id number]  CC: Hospital follow-up  HPI Carla Hicks is a 45 year old female with a history of Obesity, uncontrolled Diabetes Mellitus (A1c 10.0), Hypertension, Alpha-1 antitrypsin deficiency who was hospitalized at [facility] for right leg cellulitis a week ago, discharged on doxycycline, did not have wound dressing changes at home until she presented to the clinic again 2 days ago with fever (Tmax 104), purulent discharge from her right leg, hyperglycemia with CBG over 400 which she was referred back to the ED. She was ruled out for sepsis but had an elevated lactate which trended down with IV fluids and she was subsequently discharged.  On walking into the exam room and saying hello to the patient she did not respond but wanted to know where I was from. I politely explained to her I would be addressing her medical issues and doing my best to optimize control and where I was from would be irrelevant to her care. She raised her voice and said she had a right to ask.  I proceeded with the encounter and went on to discuss her medical issues informing her of her CBG reading of 197 recorded in her chart to which she replied that was wrong - "it was 187" and I again replied that was the reading obtained by the nurse and documented. She had a defensive attitude through out the whole encounter.  She had a dressing change performed by her daughter yesterday and states she has had a fever of 100F. With regards to her diabetes she states she has been compliant with her Levemir and NovoLog and blood sugars have ranged from 150 to 400.  Outpatient Prescriptions Prior to Visit  Medication Sig Dispense Refill  . albuterol (PROVENTIL HFA;VENTOLIN HFA) 108 (90 Base) MCG/ACT inhaler Inhale 2 puffs into the lungs every 4 (four) hours as needed for wheezing or shortness of breath.    Marland Kitchen albuterol (PROVENTIL) (5 MG/ML)  0.5% nebulizer solution Take 0.5 mLs (2.5 mg total) by nebulization every 4 (four) hours as needed for wheezing or shortness of breath. Sob (Patient taking differently: Take 2.5 mg by nebulization every 4 (four) hours. Sob) 20 mL 5  . atorvastatin (LIPITOR) 20 MG tablet Take 1 tablet (20 mg total) by mouth daily at 6 PM. 30 tablet 0  . blood glucose meter kit and supplies KIT Dispense based on patient and insurance preference. Use up to four times daily as directed. (FOR ICD-9 250.00, 250.01). 1 each 0  . budesonide (PULMICORT) 0.5 MG/2ML nebulizer solution Take 0.5 mg by nebulization every 4 (four) hours.    . budesonide-formoterol (SYMBICORT) 80-4.5 MCG/ACT inhaler Take 2 puffs first thing in am and then another 2 puffs about 12 hours later. 1 Inhaler 11  . diclofenac sodium (VOLTAREN) 1 % GEL Apply 1 application topically 4 (four) times daily. (Patient taking differently: Apply 1 application topically 4 (four) times daily as needed (pain.). ) 100 g 0  . doxycycline (VIBRA-TABS) 100 MG tablet Take 1 tablet (100 mg total) by mouth 2 (two) times daily. 20 tablet 0  . gabapentin (NEURONTIN) 300 MG capsule Take 1 capsule (300 mg total) by mouth 2 (two) times daily. 60 capsule 5  . gemfibrozil (LOPID) 600 MG tablet Take 1 tablet (600 mg total) by mouth 2 (two) times daily before a meal. 60 tablet 12  . glucose blood test strip Use as instructed  120 each 0  . HYDROcodone-acetaminophen (NORCO/VICODIN) 5-325 MG tablet Take 1-2 tablets by mouth every 4 (four) hours as needed for moderate pain. 30 tablet 0  . ibuprofen (ADVIL) 200 MG tablet Take 1-2 tablets (200-400 mg total) by mouth every 6 (six) hours as needed for mild pain or moderate pain. 30 tablet 0  . insulin aspart (NOVOLOG) 100 UNIT/ML injection Inject 0-15 Units into the skin 3 (three) times daily with meals. Sliding scale  CBG 70 - 120: 0 units: CBG 121 - 150: 2 units; CBG 151 - 200: 3 units; CBG 201 - 250: 5 units; CBG 251 - 300: 8 units;CBG 301 -  350: 11 units; CBG 351 - 400: 15 units; CBG > 400 : 15 units and notify MD 10 mL 4  . Insulin Pen Needle 29G X 13MM MISC 1 Device by Does not apply route 4 (four) times daily - after meals and at bedtime. 90 each 12  . INSULIN SYRINGE 1CC/29G 29G X 1/2" 1 ML MISC 1 Syringe by Does not apply route 4 (four) times daily - after meals and at bedtime. 90 each 0  . Lancets MISC 1 Stick by Does not apply route 4 (four) times daily -  with meals and at bedtime. 120 each 0  . Multiple Vitamin (MULTIVITAMIN WITH MINERALS) TABS tablet Take 1 tablet by mouth daily.    Marland Kitchen oxyCODONE (OXY IR/ROXICODONE) 5 MG immediate release tablet Take 5 mg by mouth every 6 (six) hours as needed for moderate pain or severe pain.    . pantoprazole (PROTONIX) 40 MG tablet Take 1 tablet (40 mg total) by mouth daily. Take 30-60 min before first meal of the day 30 tablet 2  . potassium chloride SA (K-DUR,KLOR-CON) 20 MEQ tablet Take 1 tablet (20 mEq total) by mouth daily. 5 tablet 0  . predniSONE (DELTASONE) 20 MG tablet Take 20 mg by mouth daily.  3  . insulin detemir (LEVEMIR) 100 UNIT/ML injection Inject 0.25 mLs (25 Units total) into the skin 2 (two) times daily. 10 mL 11  . cloNIDine (CATAPRES) 0.1 MG tablet Take 1 tablet (0.1 mg total) by mouth 2 (two) times daily. (Patient not taking: Reported on 05/21/2016) 60 tablet 11  . glucose blood test strip As directed  0  . Respiratory Therapy Supplies (FLUTTER) DEVI Use as directed (Patient not taking: Reported on 05/21/2016) 1 each 0   No facility-administered medications prior to visit.    ROS Review of Systems  Constitutional: Negative for activity change, appetite change and fatigue.  HENT: Negative for congestion, sinus pressure and sore throat.   Eyes: Negative for visual disturbance.  Respiratory: Negative for cough, chest tightness, shortness of breath and wheezing.   Cardiovascular: Negative for chest pain and palpitations.  Gastrointestinal: Negative for abdominal pain,  constipation and abdominal distention.  Endocrine: Negative for polydipsia.  Genitourinary: Negative for dysuria and frequency.  Musculoskeletal:       See hpi  Skin: Negative for rash.  Neurological: Negative for tremors, light-headedness and numbness.  Hematological: Does not bruise/bleed easily.  Psychiatric/Behavioral: Negative for behavioral problems and agitation.    Objective:  BP 115/80 mmHg  Pulse 93  Temp(Src) 98.9 F (37.2 C) (Oral)  Ht 5' 4"  (1.626 m)  Wt 235 lb 3.2 oz (106.686 kg)  BMI 40.35 kg/m2  SpO2 97%  LMP 04/19/2016  BP/Weight 05/21/2016 05/19/2016 7/32/2025  Systolic BP 427 062 99  Diastolic BP 80 88 74  Wt. (Lbs) 235.2 - 233.4  BMI  40.35 - 40.04      Physical Exam  Constitutional: She is oriented to person, place, and time. She appears well-developed and well-nourished.  Cardiovascular: Normal rate, normal heart sounds and intact distal pulses.   No murmur heard. Pulmonary/Chest: Effort normal and breath sounds normal. She has no wheezes. She has no rales. She exhibits no tenderness.  Abdominal: Soft. Bowel sounds are normal. She exhibits no distension and no mass. There is no tenderness.  Musculoskeletal: She exhibits edema (edema of right leg with associated erythema, extensive peeling of overlying skin, no purulent discharge, no foul odor. Tender to palpation).  Left leg is normal  Neurological: She is alert and oriented to person, place, and time.    CMP Latest Ref Rng 05/19/2016 05/13/2016 05/12/2016  Glucose 65 - 99 mg/dL 288(H) 300(H) 220(H)  BUN 6 - 20 mg/dL <5(L) 6 6  Creatinine 0.44 - 1.00 mg/dL <0.30(L) 0.69 0.53  Sodium 135 - 145 mmol/L 132(L) 135 136  Potassium 3.5 - 5.1 mmol/L 4.9 3.2(L) 3.7  Chloride 101 - 111 mmol/L 99(L) 104 105  CO2 22 - 32 mmol/L 27 23 21(L)  Calcium 8.9 - 10.3 mg/dL 9.2 7.3(L) 7.4(L)  Total Protein 6.5 - 8.1 g/dL 6.6 - -  Total Bilirubin 0.3 - 1.2 mg/dL 0.5 - -  Alkaline Phos 38 - 126 U/L 55 - -  AST 15 - 41  U/L 22 - -  ALT 14 - 54 U/L 18 - -     CBC    Component Value Date/Time   WBC 7.6 05/19/2016 1939   RBC 4.12 05/19/2016 1939   RBC 3.40* 05/12/2016 0716   HGB 11.8* 05/19/2016 1939   HCT 36.2 05/19/2016 1939   PLT 329 05/19/2016 1939   MCV 87.9 05/19/2016 1939   MCH 28.6 05/19/2016 1939   MCHC 32.6 05/19/2016 1939   RDW 14.5 05/19/2016 1939   LYMPHSABS 2.3 05/19/2016 1939   MONOABS 0.3 05/19/2016 1939   EOSABS 0.0 05/19/2016 1939   BASOSABS 0.0 05/19/2016 1939     Assessment & Plan:   1. Essential hypertension, benign Controlled ,continue antihypertensives  2. Cellulitis of right lower extremity Wound dressing change performed in the clinic. She never got discharged with home nursing services I have explained to her that given she has no medical coverage referral to wound care center will be challenging but we will do our best to obtain an appointment. Meanwhile continue wound dressing changes at home and doxycycline. - AMB referral to wound care center  3. Morbid obesity, unspecified obesity type ([place]) Increase physical activity Reduce portion sizes  4. Uncontrolled type 2 diabetes mellitus with complication, unspecified long term insulin use status ([place]) Uncontrolled with A1c of 10.0 from 04/2016 Increase Levemir to 30 units twice daily and continue NovoLog sliding scale Blood sugar log reviewed at next visit by PCP. - Glucose (CBG) - insulin detemir (LEVEMIR) 100 UNIT/ML injection; Inject 0.3 mLs (30 Units total) into the skin 2 (two) times daily.  Dispense: 10 mL; Refill: 3   On my exiting the room she insisted on my answering the question on where I was from and I again poliitely informed her I felt that was a bit personal and unrelated to her care and she got annoyed and made some additional utterances. Her care going forward  will be transferred back to her PCP.  Meds ordered this encounter  Medications  . insulin detemir (LEVEMIR) 100 UNIT/ML injection     Sig: Inject 0.3 mLs (30  Units total) into the skin 2 (two) times daily.    Dispense:  10 mL    Refill:  3    Discontinue previous dose    Follow-up: 2 weeks with PCP   Arnoldo Morale MD

## 2016-05-21 NOTE — Progress Notes (Signed)
Hospital follow up, took all medications, not fasting. FBS this morning 127, cellulitis right leg

## 2016-05-24 LAB — CULTURE, BLOOD (ROUTINE X 2)
Culture: NO GROWTH
Culture: NO GROWTH

## 2016-05-25 ENCOUNTER — Other Ambulatory Visit: Payer: Self-pay | Admitting: Family Medicine

## 2016-05-25 MED ORDER — ALBUTEROL SULFATE HFA 108 (90 BASE) MCG/ACT IN AERS: 2.0000 | INHALATION_SPRAY | RESPIRATORY_TRACT | Status: DC | PRN

## 2016-05-26 ENCOUNTER — Other Ambulatory Visit: Payer: Self-pay | Admitting: *Deleted

## 2016-05-26 DIAGNOSIS — E118 Type 2 diabetes mellitus with unspecified complications: Principal | ICD-10-CM

## 2016-05-26 DIAGNOSIS — E1165 Type 2 diabetes mellitus with hyperglycemia: Secondary | ICD-10-CM

## 2016-05-26 MED ORDER — INSULIN DETEMIR 100 UNIT/ML ~~LOC~~ SOLN: 30.0000 [IU] | Freq: Two times a day (BID) | SUBCUTANEOUS | Status: DC

## 2016-05-26 MED ORDER — ALBUTEROL SULFATE HFA 108 (90 BASE) MCG/ACT IN AERS: 2.0000 | INHALATION_SPRAY | RESPIRATORY_TRACT | Status: DC | PRN

## 2016-05-26 MED ORDER — BUDESONIDE-FORMOTEROL FUMARATE 80-4.5 MCG/ACT IN AERO: INHALATION_SPRAY | RESPIRATORY_TRACT | Status: DC

## 2016-05-29 ENCOUNTER — Other Ambulatory Visit: Payer: Self-pay | Admitting: *Deleted

## 2016-05-29 DIAGNOSIS — E1165 Type 2 diabetes mellitus with hyperglycemia: Secondary | ICD-10-CM

## 2016-05-29 DIAGNOSIS — E118 Type 2 diabetes mellitus with unspecified complications: Principal | ICD-10-CM

## 2016-05-29 MED ORDER — INSULIN DETEMIR 100 UNIT/ML ~~LOC~~ SOLN: 30.0000 [IU] | Freq: Two times a day (BID) | SUBCUTANEOUS | Status: DC

## 2016-06-02 ENCOUNTER — Other Ambulatory Visit: Payer: Self-pay | Admitting: *Deleted

## 2016-06-02 MED ORDER — INSULIN ASPART 100 UNIT/ML ~~LOC~~ SOLN: 0.0000 [IU] | Freq: Three times a day (TID) | SUBCUTANEOUS | Status: DC

## 2016-06-02 MED FILL — !LEVEMIR FLEXPEN 100UNITS/M: 100U/ML (3) | 16 days supply | Qty: 6 | Fill #2

## 2016-06-04 ENCOUNTER — Other Ambulatory Visit: Payer: Self-pay | Admitting: *Deleted

## 2016-06-04 MED ORDER — ATORVASTATIN CALCIUM 20 MG PO TABS: 20.0000 mg | ORAL_TABLET | Freq: Every day | ORAL | Status: DC

## 2016-06-04 MED FILL — ?ATORVASTATIN 20 MG TABLET: 20 | 30 days supply | Qty: 30 | Fill #0

## 2016-06-08 ENCOUNTER — Encounter (HOSPITAL_BASED_OUTPATIENT_CLINIC_OR_DEPARTMENT_OTHER): Payer: Self-pay

## 2016-06-08 MED FILL — GEMFIBROZIL 600 MG TABLET: 600 | 30 days supply | Qty: 60 | Fill #1

## 2016-06-08 MED FILL — glipiZIDE 5 MG TABS: 5 | 30 days supply | Qty: 60 | Fill #1

## 2016-06-08 MED FILL — !VENTOLIN HFA INHALER: 108 (90 BAS | 28 days supply | Qty: 18 | Fill #0

## 2016-06-08 MED FILL — !NOVOLOG 100UNITS/ML VIAL: 100/ML | 30 days supply | Qty: 10 | Fill #1

## 2016-06-08 MED FILL — metFORMIN HCL 500 MG TABS: 500 | 30 days supply | Qty: 60 | Fill #1

## 2016-06-08 MED FILL — GABAPENTIN 300 MG CAPSULE: 300 | 30 days supply | Qty: 60 | Fill #4

## 2016-06-12 ENCOUNTER — Ambulatory Visit: Payer: Self-pay | Admitting: Family Medicine

## 2016-06-25 ENCOUNTER — Ambulatory Visit: Payer: Self-pay | Admitting: Family Medicine

## 2016-07-10 ENCOUNTER — Telehealth: Payer: Self-pay | Admitting: Family Medicine

## 2016-07-10 ENCOUNTER — Ambulatory Visit (HOSPITAL_BASED_OUTPATIENT_CLINIC_OR_DEPARTMENT_OTHER): Payer: Self-pay | Admitting: Family Medicine

## 2016-07-10 DIAGNOSIS — E1165 Type 2 diabetes mellitus with hyperglycemia: Secondary | ICD-10-CM

## 2016-07-10 DIAGNOSIS — Z794 Long term (current) use of insulin: Secondary | ICD-10-CM

## 2016-07-10 DIAGNOSIS — E118 Type 2 diabetes mellitus with unspecified complications: Secondary | ICD-10-CM

## 2016-07-10 MED FILL — ?METFORMIN HCL 500MG TABLET: 500 | 30 days supply | Qty: 60 | Fill #2

## 2016-07-10 MED FILL — !LEVEMIR FLEXPEN 100UNITS/M: 100U/ML (3) | 16 days supply | Qty: 6 | Fill #3

## 2016-07-10 MED FILL — !NOVOLOG 100UNITS/ML VIAL: 100/ML | 30 days supply | Qty: 10 | Fill #2

## 2016-07-10 MED FILL — !VENTOLIN HFA INHALER: 108 (90 BAS | 30 days supply | Qty: 18 | Fill #0

## 2016-07-10 MED FILL — GABAPENTIN 300 MG CAPSULE: 300 | 30 days supply | Qty: 60 | Fill #5

## 2016-07-10 MED FILL — predniSONE 20 MG TABS: 20 | 30 days supply | Qty: 30 | Fill #1

## 2016-07-10 MED FILL — cloNIDine HCL 0.1 MG TABS: 0.1 | 30 days supply | Qty: 60 | Fill #1

## 2016-07-10 MED FILL — glipiZIDE 5 MG TABS: 5 | 30 days supply | Qty: 60 | Fill #2

## 2016-07-10 MED FILL — GEMFIBROZIL 600 MG TABLET: 600 | 30 days supply | Qty: 60 | Fill #2

## 2016-07-10 NOTE — Telephone Encounter (Signed)
Patient would like hydrochlorothiazide. Please follow up.

## 2016-07-10 NOTE — Telephone Encounter (Signed)
She will have to wait until she has a BP check to determine if HCTZ needs to be restarted She should re-scheduled missed appt at her earliest convenience with myself or first available provider

## 2016-07-10 NOTE — Telephone Encounter (Signed)
She missed her appt and to reschedule Was not seen today. Chart opened in error while prepping for visit

## 2016-07-10 NOTE — Telephone Encounter (Signed)
Patient wants a refill on current medications. Patient did not specify.

## 2016-07-10 NOTE — Telephone Encounter (Signed)
This medication was discontinued in the hospital in May 2017 (04/21/16).

## 2016-07-10 NOTE — Telephone Encounter (Signed)
Patient seen by Dr. Armen Pickup today, I will forward request to her.

## 2016-07-10 NOTE — Progress Notes (Signed)
Patient missed appt and had to re-schedule Encounter opened in error

## 2016-07-13 ENCOUNTER — Telehealth: Payer: Self-pay

## 2016-07-13 NOTE — Telephone Encounter (Signed)
Spoke with pt and per pt she had an appt schedule Friday July 10, 2016  pt stated when she made the appt she informed the receptionist she would be here at 3pm. Pt then stated the receptionist stated that would be fine. Pt then stated she got to her appt at then and she was informed that Dr. Armen Pickup wouldn't see her. Pt states Chadell informed her that it would be okay if got her at 3pm. I spoke with Chadell and she informed me pt got to her appt 45 mins late. Spoke with Dr. Izetta Dakin and Harvin Hazel. Per Dr. Rosalie Doctor and Harvin Hazel schedule pt for August 7 at 10am and see if Misty Stanley can send her hctz for 30 days. Schedule pt appt and stacey will be sending in rx into pharmacy. Pt is aware of appt and rx being sent into pharmacy.

## 2016-07-13 NOTE — Telephone Encounter (Signed)
Per Dr. Armen Pickup - no HCTZ refill until she has her BP checked.

## 2016-07-13 NOTE — Telephone Encounter (Signed)
Could you please fill hctz for 30 days thanks

## 2016-07-17 ENCOUNTER — Ambulatory Visit: Payer: Self-pay | Admitting: Family Medicine

## 2016-07-27 ENCOUNTER — Ambulatory Visit: Payer: Self-pay | Admitting: Family Medicine

## 2016-08-12 ENCOUNTER — Other Ambulatory Visit: Payer: Self-pay | Admitting: Family Medicine

## 2016-08-12 ENCOUNTER — Other Ambulatory Visit: Payer: Self-pay | Admitting: Physician Assistant

## 2016-08-12 MED FILL — !NOVOLOG 100UNITS/ML VIAL: 100/ML | 30 days supply | Qty: 10 | Fill #3

## 2016-08-12 MED FILL — glipiZIDE 5 MG TABS: 5 | 30 days supply | Qty: 60 | Fill #3

## 2016-08-12 MED FILL — predniSONE 20 MG TABS: 20 | 30 days supply | Qty: 30 | Fill #2

## 2016-08-12 MED FILL — ?METFORMIN HCL 500MG TABLET: 500 | 30 days supply | Qty: 60 | Fill #3

## 2016-08-12 MED FILL — ?ATORVASTATIN 20 MG TABLET: 20 | 30 days supply | Qty: 30 | Fill #0

## 2016-08-12 MED FILL — GEMFIBROZIL 600 MG TABLET: 600 | 30 days supply | Qty: 60 | Fill #3

## 2016-08-12 MED FILL — GABAPENTIN 300 MG CAPSULE: 300 | 30 days supply | Qty: 60 | Fill #0

## 2016-08-12 MED FILL — cloNIDine HCL 0.1 MG TABS: 0.1 | 30 days supply | Qty: 60 | Fill #2

## 2016-08-12 MED FILL — !LEVEMIR FLEXPEN 100UNITS/M: 100U/ML (3) | 16 days supply | Qty: 6 | Fill #4

## 2016-08-21 ENCOUNTER — Encounter: Payer: Self-pay | Admitting: Family Medicine

## 2016-08-21 ENCOUNTER — Ambulatory Visit: Payer: Self-pay | Attending: Family Medicine | Admitting: Family Medicine

## 2016-08-21 VITALS — BP 123/80 | HR 96 | Temp 98.2°F | Ht 64.0 in | Wt 229.6 lb

## 2016-08-21 DIAGNOSIS — I10 Essential (primary) hypertension: Secondary | ICD-10-CM | POA: Insufficient documentation

## 2016-08-21 DIAGNOSIS — Z794 Long term (current) use of insulin: Secondary | ICD-10-CM | POA: Insufficient documentation

## 2016-08-21 DIAGNOSIS — R05 Cough: Secondary | ICD-10-CM | POA: Insufficient documentation

## 2016-08-21 DIAGNOSIS — IMO0002 Reserved for concepts with insufficient information to code with codable children: Secondary | ICD-10-CM

## 2016-08-21 DIAGNOSIS — R300 Dysuria: Secondary | ICD-10-CM | POA: Insufficient documentation

## 2016-08-21 DIAGNOSIS — E1165 Type 2 diabetes mellitus with hyperglycemia: Secondary | ICD-10-CM | POA: Insufficient documentation

## 2016-08-21 DIAGNOSIS — Z23 Encounter for immunization: Secondary | ICD-10-CM

## 2016-08-21 DIAGNOSIS — L905 Scar conditions and fibrosis of skin: Secondary | ICD-10-CM

## 2016-08-21 DIAGNOSIS — E118 Type 2 diabetes mellitus with unspecified complications: Secondary | ICD-10-CM

## 2016-08-21 DIAGNOSIS — M79662 Pain in left lower leg: Secondary | ICD-10-CM | POA: Insufficient documentation

## 2016-08-21 DIAGNOSIS — J45991 Cough variant asthma: Secondary | ICD-10-CM

## 2016-08-21 LAB — BASIC METABOLIC PANEL WITH GFR
BUN: 6 mg/dL — AB (ref 7–25)
CALCIUM: 8.3 mg/dL — AB (ref 8.6–10.2)
CO2: 23 mmol/L (ref 20–31)
CREATININE: 0.9 mg/dL (ref 0.50–1.10)
Chloride: 103 mmol/L (ref 98–110)
GFR, EST AFRICAN AMERICAN: 89 mL/min (ref >=60)
GFR, Est Non African American: 77 mL/min (ref >=60)
Glucose, Bld: 490 mg/dL — ABNORMAL HIGH (ref 65–99)
POTASSIUM: 3.6 mmol/L (ref 3.5–5.3)
Sodium: 136 mmol/L (ref 135–146)

## 2016-08-21 LAB — POCT URINALYSIS DIPSTICK
BILIRUBIN UA: NEGATIVE
GLUCOSE UA: 1000
KETONES UA: NEGATIVE
Nitrite, UA: POSITIVE
Urobilinogen, UA: 0.2
pH, UA: 5.5

## 2016-08-21 LAB — GLUCOSE, POCT (MANUAL RESULT ENTRY): POC Glucose: 456 mg/dl — AB (ref 70–99)

## 2016-08-21 LAB — POCT GLYCOSYLATED HEMOGLOBIN (HGB A1C): Hemoglobin A1C: 11.1

## 2016-08-21 MED ORDER — TRIAMCINOLONE ACETONIDE 0.5 % EX OINT: 1.0000 "application " | TOPICAL_OINTMENT | Freq: Two times a day (BID) | CUTANEOUS | 0 refills | Status: DC

## 2016-08-21 MED ORDER — GUAIFENESIN-CODEINE 100-10 MG/5ML PO SYRP: 5.0000 mL | ORAL_SOLUTION | Freq: Every evening | ORAL | 0 refills | Status: DC | PRN

## 2016-08-21 MED ORDER — INSULIN ASPART 100 UNIT/ML ~~LOC~~ SOLN: 10.0000 [IU] | Freq: Three times a day (TID) | SUBCUTANEOUS | 4 refills | Status: DC

## 2016-08-21 MED ORDER — NITROFURANTOIN MONOHYD MACRO 100 MG PO CAPS: 100.0000 mg | ORAL_CAPSULE | Freq: Two times a day (BID) | ORAL | 0 refills | Status: DC

## 2016-08-21 MED ORDER — INSULIN ASPART 100 UNIT/ML ~~LOC~~ SOLN
20.0000 [IU] | Freq: Once | SUBCUTANEOUS | Status: AC
  Administered 2016-08-21: 20 [IU] via SUBCUTANEOUS

## 2016-08-21 MED FILL — GUAIFENESIN AC COUGH SYRUP: 100-10 | 24 days supply | Qty: 120 | Fill #0

## 2016-08-21 MED FILL — ?NITROFURANTOIN-MACRO 100 M: 100 | 5 days supply | Qty: 10 | Fill #0

## 2016-08-21 MED FILL — TRIAMCINOLONE 0.5% OINTMENT: 0.5 | 20 days supply | Qty: 30 | Fill #0

## 2016-08-21 NOTE — Progress Notes (Signed)
Subjective:  Patient ID: Carla Hicks, female    DOB: 1971/06/08  Age: 45 y.o. MRN: [id number]  CC: Medication Refill   HPI Carla Hicks has Hx of HTN, DM2, alcohol abuse, chronic cough, smoking she  presents for   1. HTN: no medication currently. Complains of legs swelling. She would like thiazide diuretic to be restarted. This was stopped due to low sodium and potassium in 04/2016.   2. Diabetes: taking levemir and sliding scale novolog. Has hyperglycemia. She denies ETOH. She is not exercising. She admits to poyluria and dysuria.   3. Scar of R calf: scab on calf for past 2-3 months. Non tender and no drainage. She is concerned that it is taking a long time to heal.   3. Chronic cough: she is followed by pulmonology. She request refill of cough medicine with codeine for nighttime use. She reports tessalon perles do not control her cough. No fever, chills, CP or SOB.  4. Dysuria: for past 2-3 days. No fever, chills, flank pain, nausea or emesis. Dysuria associated with polyuria.   5. L calf pain: x 1 month. With swelling. No travel. Last hospitalization 05/09/16-05/14/16. No skin changes. She is concerned about having a clot in her leg.   Social History  Substance Use Topics  . Smoking status: Current Every Day Smoker    Packs/day: 0.50    Years: 25.00    Types: Cigarettes    Last attempt to quit: 12/22/2011  . Smokeless tobacco: Never Used  . Alcohol use No     Comment: states quit in May 2017    Outpatient Medications Prior to Visit  Medication Sig Dispense Refill  . albuterol (PROVENTIL HFA;VENTOLIN HFA) 108 (90 Base) MCG/ACT inhaler Inhale 2 puffs into the lungs every 4 (four) hours as needed for wheezing or shortness of breath. 54 g 3  . albuterol (PROVENTIL) (5 MG/ML) 0.5% nebulizer solution Take 0.5 mLs (2.5 mg total) by nebulization every 4 (four) hours as needed for wheezing or shortness of breath. Sob (Patient taking differently: Take 2.5 mg by nebulization every  4 (four) hours. Sob) 20 mL 5  . atorvastatin (LIPITOR) 20 MG tablet TAKE 1 TABLET BY MOUTH DAILY AT 6 PM 30 tablet 5  . blood glucose meter kit and supplies KIT Dispense based on patient and insurance preference. Use up to four times daily as directed. (FOR ICD-9 250.00, 250.01). 1 each 0  . budesonide (PULMICORT) 0.5 MG/2ML nebulizer solution Take 0.5 mg by nebulization every 4 (four) hours.    . budesonide-formoterol (SYMBICORT) 80-4.5 MCG/ACT inhaler Take 2 puffs first thing in am and then another 2 puffs about 12 hours later. 3 Inhaler 3  . cloNIDine (CATAPRES) 0.1 MG tablet Take 1 tablet (0.1 mg total) by mouth 2 (two) times daily. (Patient not taking: Reported on 05/21/2016) 60 tablet 11  . diclofenac sodium (VOLTAREN) 1 % GEL Apply 1 application topically 4 (four) times daily. (Patient taking differently: Apply 1 application topically 4 (four) times daily as needed (pain.). ) 100 g 0  . gabapentin (NEURONTIN) 300 MG capsule TAKE 1 CAPSULE BY MOUTH 2 TIMES DAILY 60 capsule 2  . gemfibrozil (LOPID) 600 MG tablet Take 1 tablet (600 mg total) by mouth 2 (two) times daily before a meal. 60 tablet 12  . glucose blood test strip Use as instructed 120 each 0  . HYDROcodone-acetaminophen (NORCO/VICODIN) 5-325 MG tablet Take 1-2 tablets by mouth every 4 (four) hours as needed for moderate pain. [place]  tablet 0  . ibuprofen (ADVIL) 200 MG tablet Take 1-2 tablets (200-400 mg total) by mouth every 6 (six) hours as needed for mild pain or moderate pain. 30 tablet 0  . insulin aspart (NOVOLOG) 100 UNIT/ML injection Inject 0-15 Units into the skin 3 (three) times daily with meals. Sliding scale  CBG 70 - 120: 0 units: CBG 121 - 150: 2 units; CBG 151 - 200: 3 units; CBG 201 - 250: 5 units; CBG 251 - 300: 8 units;CBG 301 - 350: 11 units; CBG 351 - 400: 15 units; CBG > 400 : 15 units and notify MD 10 mL 4  . insulin detemir (LEVEMIR) 100 UNIT/ML injection Inject 0.3 mLs (30 Units total) into the skin 2 (two) times daily.  60 mL 3  . INSULIN SYRINGE 1CC/29G 29G X 1/2" 1 ML MISC 1 Syringe by Does not apply route 4 (four) times daily - after meals and at bedtime. 90 each 0  . Lancets MISC 1 Stick by Does not apply route 4 (four) times daily -  with meals and at bedtime. 120 each 0  . Multiple Vitamin (MULTIVITAMIN WITH MINERALS) TABS tablet Take 1 tablet by mouth daily.    Marland Kitchen oxyCODONE (OXY IR/ROXICODONE) 5 MG immediate release tablet Take 5 mg by mouth every 6 (six) hours as needed for moderate pain or severe pain.    . pantoprazole (PROTONIX) 40 MG tablet Take 1 tablet (40 mg total) by mouth daily. Take 30-60 min before first meal of the day 30 tablet 2  . predniSONE (DELTASONE) 20 MG tablet Take 20 mg by mouth daily.  3   No facility-administered medications prior to visit.     ROS Review of Systems  Constitutional: Negative for chills and fever.  Eyes: Negative for visual disturbance.  Respiratory: Positive for cough. Negative for shortness of breath.   Cardiovascular: Positive for leg swelling. Negative for chest pain.  Gastrointestinal: Negative for abdominal pain and blood in stool.  Endocrine: Positive for polyuria.  Genitourinary: Positive for dysuria.  Musculoskeletal: Positive for myalgias. Negative for arthralgias and back pain.  Skin: Positive for wound. Negative for rash.  Allergic/Immunologic: Negative for immunocompromised state.  Hematological: Negative for adenopathy. Does not bruise/bleed easily.  Psychiatric/Behavioral: Negative for dysphoric mood and suicidal ideas.    Objective:  BP 123/80 (BP Location: Right Arm, Patient Position: Sitting, Cuff Size: Large)   Pulse 96   Temp 98.2 F (36.8 C) (Oral)   Ht 5' 4"  (1.626 m)   Wt 229 lb 9.6 oz (104.1 kg)   LMP 06/20/2016   SpO2 98%   BMI 39.41 kg/m   BP/Weight 08/21/2016 05/21/2016 0/96/2836  Systolic BP 629 476 546  Diastolic BP 80 80 88  Wt. (Lbs) 229.6 235.2 -  BMI 39.41 40.35 -   Physical Exam  Constitutional: She is oriented  to person, place, and time. She appears well-developed and well-nourished. No distress.  Obese   HENT:  Head: Normocephalic and atraumatic.  Cardiovascular: Normal rate, regular rhythm, normal heart sounds and intact distal pulses.   Pulmonary/Chest: Effort normal and breath sounds normal.  Musculoskeletal: She exhibits edema (trace b/l LE ).       Left lower leg: She exhibits tenderness (no palpable cords ).       Legs: Neurological: She is alert and oriented to person, place, and time.  Skin: Skin is warm and dry. No rash noted.  Psychiatric: She has a normal mood and affect.    Lab Results  Component Value Date   HGBA1C 11.1 08/21/2016   CBG 456   Treated with 20 U of novolog  UA 100 glucose, moderate LE, small RBC, positive nitrite, negative ketones  Assessment & Plan:  Tenika was seen today for medication refill.  Diagnoses and all orders for this visit:  Uncontrolled type 2 diabetes mellitus with complication, with long-term current use of insulin (HCC) -     HgB A1c -     Glucose (CBG) -     Cancel: POCT urinalysis dipstick -     Discontinue: insulin aspart (NOVOLOG) 100 UNIT/ML injection; Inject 10 Units into the skin 3 (three) times daily with meals. -     POCT urinalysis dipstick -     Microalbumin/Creatinine Ratio, Urine -     BASIC METABOLIC PANEL WITH GFR -     insulin aspart (NOVOLOG) 100 UNIT/ML injection; Inject 10 Units into the skin 3 (three) times daily with meals. -     insulin aspart (novoLOG) injection 20 Units; Inject 0.2 mLs (20 Units total) into the skin once.  Essential hypertension, benign -     BASIC METABOLIC PANEL WITH GFR  Cough variant asthma vs UACS  -     guaiFENesin-codeine (ROBITUSSIN AC) 100-10 MG/5ML syrup; Take 5 mLs by mouth at bedtime as needed for cough.  Scar of lower leg -     triamcinolone ointment (KENALOG) 0.5 %; Apply 1 application topically 2 (two) times daily.  Calf pain, left -     D-dimer, quantitative (not at  [facility])  Dysuria -     Urine culture -     nitrofurantoin, macrocrystal-monohydrate, (MACROBID) 100 MG capsule; Take 1 capsule (100 mg total) by mouth 2 (two) times daily.  Encounter for immunization -     Flu Vaccine QUAD 36+ mos IM   No orders of the defined types were placed in this encounter.   Follow-up: Return in about 8 weeks (around 10/16/2016) for pap smear .   Boykin Nearing MD

## 2016-08-21 NOTE — Patient Instructions (Addendum)
Carla Hicks was seen today for medication refill.  Diagnoses and all orders for this visit:  Uncontrolled type 2 diabetes mellitus with complication, with long-term current use of insulin (HCC) -     HgB A1c -     Glucose (CBG) -     Cancel: POCT urinalysis dipstick -     Discontinue: insulin aspart (NOVOLOG) 100 UNIT/ML injection; Inject 10 Units into the skin 3 (three) times daily with meals. -     POCT urinalysis dipstick -     Microalbumin/Creatinine Ratio, Urine -     BASIC METABOLIC PANEL WITH GFR -     insulin aspart (NOVOLOG) 100 UNIT/ML injection; Inject 10 Units into the skin 3 (three) times daily with meals. -     insulin aspart (novoLOG) injection 20 Units; Inject 0.2 mLs (20 Units total) into the skin once.  Essential hypertension, benign -     BASIC METABOLIC PANEL WITH GFR  Cough variant asthma vs UACS  -     guaiFENesin-codeine (ROBITUSSIN AC) 100-10 MG/5ML syrup; Take 5 mLs by mouth at bedtime as needed for cough.  Scar of lower leg -     triamcinolone ointment (KENALOG) 0.5 %; Apply 1 application topically 2 (two) times daily.  Calf pain, left -     D-dimer, quantitative (not at [facility])  Dysuria -     Urine culture -     nitrofurantoin, macrocrystal-monohydrate, (MACROBID) 100 MG capsule; Take 1 capsule (100 mg total) by mouth 2 (two) times daily.  Encounter for immunization -     Flu Vaccine QUAD 36+ mos IM    You will be called with lab results, if sodium is normal thiazide diuretic can be restarted. Continue not to smoke and drink alcohol. Keep in mind that too much alcohol also lowers sodium  F/u in 6-8 weeks for pap smear   Dr. Armen Pickup

## 2016-08-22 LAB — MICROALBUMIN / CREATININE URINE RATIO
Creatinine, Urine: 57 mg/dL (ref 20–320)
MICROALB UR: 3.6 mg/dL
MICROALB/CREAT RATIO: 63 ug/mg{creat} — AB (ref <30)

## 2016-08-22 LAB — D-DIMER, QUANTITATIVE: D-Dimer, Quant: 0.39 mcg/mL FEU (ref <0.50)

## 2016-08-23 LAB — URINE CULTURE

## 2016-08-25 ENCOUNTER — Telehealth: Payer: Self-pay | Admitting: Family Medicine

## 2016-08-25 DIAGNOSIS — R300 Dysuria: Secondary | ICD-10-CM | POA: Insufficient documentation

## 2016-08-25 DIAGNOSIS — L905 Scar conditions and fibrosis of skin: Secondary | ICD-10-CM | POA: Insufficient documentation

## 2016-08-25 DIAGNOSIS — M79669 Pain in unspecified lower leg: Secondary | ICD-10-CM | POA: Insufficient documentation

## 2016-08-25 NOTE — Assessment & Plan Note (Signed)
Ordered robitussin [facility] Advised patient f/u with pulmonology Flu shot given today

## 2016-08-25 NOTE — Assessment & Plan Note (Signed)
Abnormal UA suspect UTI macrobid prescribed U Cx positive for E coli sensitive to macrobid

## 2016-08-25 NOTE — Assessment & Plan Note (Signed)
Uncontrolled diabetes Hyperglycemia treated in office  Continue levemir  Change novolog from SSI to 10 U TID

## 2016-08-25 NOTE — Assessment & Plan Note (Signed)
L sided calf pain Exam is not concerning for DVT No risk factors  D dimer obtained and normal

## 2016-08-25 NOTE — Telephone Encounter (Signed)
[facility] called in to report CBG of 490 on BMP. This was treated in office with novolog 20 U for POCT CBG of 456.

## 2016-08-25 NOTE — Assessment & Plan Note (Signed)
Normotensive with slight leg swelling  Plan: Elevation, compression, Rx for compression stocking written, low salt diet  Checking sodium level on BMP Low dose HCTZ 12.5 mg of sodium wnl

## 2016-08-27 ENCOUNTER — Telehealth: Payer: Self-pay

## 2016-08-27 NOTE — Telephone Encounter (Signed)
Pt was called and informed of results on 9/7.

## 2016-09-21 ENCOUNTER — Telehealth: Payer: Self-pay

## 2016-09-21 ENCOUNTER — Other Ambulatory Visit: Payer: Self-pay

## 2016-09-21 ENCOUNTER — Telehealth: Payer: Self-pay | Admitting: Family Medicine

## 2016-09-21 DIAGNOSIS — E118 Type 2 diabetes mellitus with unspecified complications: Principal | ICD-10-CM

## 2016-09-21 DIAGNOSIS — Z794 Long term (current) use of insulin: Principal | ICD-10-CM

## 2016-09-21 DIAGNOSIS — IMO0002 Reserved for concepts with insufficient information to code with codable children: Secondary | ICD-10-CM

## 2016-09-21 DIAGNOSIS — E1165 Type 2 diabetes mellitus with hyperglycemia: Secondary | ICD-10-CM

## 2016-09-21 MED ORDER — INSULIN DETEMIR 100 UNIT/ML ~~LOC~~ SOLN
30.0000 [IU] | Freq: Two times a day (BID) | SUBCUTANEOUS | 3 refills | Status: DC
Start: 2016-09-21 — End: 2016-10-01

## 2016-09-21 MED FILL — GABAPENTIN 300 MG CAPSULE: 300 | 30 days supply | Qty: 60 | Fill #1

## 2016-09-21 MED FILL — ?PREDNISONE 20 MG TABLET: 20 | 30 days supply | Qty: 30 | Fill #3

## 2016-09-21 MED FILL — ATORVASTATIN 20 MG TABLET: 20 | 30 days supply | Qty: 30 | Fill #1

## 2016-09-21 MED FILL — !LEVEMIR FLEXPEN 100UNITS/M: 100U/ML (3) | 16 days supply | Qty: 6 | Fill #5

## 2016-09-21 MED FILL — cloNIDine HCL 0.1 MG TABS: 0.1 | 30 days supply | Qty: 60 | Fill #3

## 2016-09-21 MED FILL — ULTICARE PEN NDL 4MM 32G: 32G X 4 MM | 25 days supply | Qty: 100 | Fill #0

## 2016-09-21 MED FILL — ULTICARE SYR 0.5 ML 29GX1/2: 29G X 1/2" | 25 days supply | Qty: 100 | Fill #0

## 2016-09-21 MED FILL — VENTOLIN HFA 90 MCG INHALER: 108 (90 BAS | 30 days supply | Qty: 18 | Fill #1

## 2016-09-21 MED FILL — !NOVOLOG 100UNITS/ML VIAL: 100/ML | 30 days supply | Qty: 10 | Fill #4

## 2016-09-21 NOTE — Telephone Encounter (Signed)
Patient presents c/o Right foot callus pain. Tried/failed gabapentin one tab twice a day.  History of diabetic neuropathy.    C/o Chest tightness x 4 days.  Has been using inhaler/breathing machine more frequently.

## 2016-09-21 NOTE — Telephone Encounter (Signed)
Patient called to get an appointment, Patient was offered an appointment on the 16th, patient hung up the phone.

## 2016-09-21 NOTE — Telephone Encounter (Signed)
Patient presented to office with above complaints Triaged by RN BP 124/84, HR 84, RR 20, O2sat 98% on RA , Temp 98.2  Plan: Podiatry referral placed for foot pain Patient stable to schedule f/u appt

## 2016-09-21 NOTE — Progress Notes (Unsigned)
Initially patient educated on office process and walk-in clinic.  After discussing patient symptoms with Dr. Armen Pickup, Patient advised per Dr. Armen Pickup schedule f/u appt; patient states she will just go to the hospital

## 2016-09-23 ENCOUNTER — Telehealth: Payer: Self-pay | Admitting: Family Medicine

## 2016-09-23 ENCOUNTER — Other Ambulatory Visit: Payer: Self-pay

## 2016-09-23 ENCOUNTER — Other Ambulatory Visit: Payer: Self-pay | Admitting: Family Medicine

## 2016-09-23 DIAGNOSIS — J45991 Cough variant asthma: Secondary | ICD-10-CM

## 2016-09-23 MED ORDER — GUAIFENESIN-CODEINE 100-10 MG/5ML PO SYRP: 5.0000 mL | ORAL_SOLUTION | Freq: Every evening | ORAL | 0 refills | Status: DC | PRN

## 2016-09-23 MED ORDER — GLUCOSE BLOOD VI STRP: ORAL_STRIP | 12 refills | Status: DC

## 2016-09-23 MED FILL — GUAIFENESIN AC COUGH SYRUP: 100-10 | 24 days supply | Qty: 120 | Fill #0

## 2016-09-23 NOTE — Telephone Encounter (Signed)
Pt. Came into facility requesting a refill on guaiFENesin-codeine (ROBITUSSIN AC) 100-10 MG/5ML syrup Please f/u

## 2016-09-23 NOTE — Telephone Encounter (Signed)
Patient presented to office requesting cough syrup (controlled substance) for SOB/Cough. Review of patient chart shows last filled 91/17 by PCP. Rn advised patient if this is something she will need on a regular bases she will need to f/u with Dr. Armen PickupFunches.  And may be required to sign controlled substance contract.   Patient also requested refill for Metformin/glipizide, but chart indicates meds were d/c in hospital. Advised to f/u with Dr. Armen PickupFunches.

## 2016-10-01 ENCOUNTER — Ambulatory Visit: Payer: Self-pay | Attending: Family Medicine | Admitting: Family Medicine

## 2016-10-01 ENCOUNTER — Encounter: Payer: Self-pay | Admitting: Family Medicine

## 2016-10-01 VITALS — BP 156/94 | HR 74 | Temp 98.9°F | Resp 20 | Ht 64.0 in | Wt 221.0 lb

## 2016-10-01 DIAGNOSIS — E669 Obesity, unspecified: Secondary | ICD-10-CM | POA: Insufficient documentation

## 2016-10-01 DIAGNOSIS — Z794 Long term (current) use of insulin: Secondary | ICD-10-CM | POA: Insufficient documentation

## 2016-10-01 DIAGNOSIS — Z87891 Personal history of nicotine dependence: Secondary | ICD-10-CM | POA: Insufficient documentation

## 2016-10-01 DIAGNOSIS — IMO0002 Reserved for concepts with insufficient information to code with codable children: Secondary | ICD-10-CM

## 2016-10-01 DIAGNOSIS — L905 Scar conditions and fibrosis of skin: Secondary | ICD-10-CM | POA: Insufficient documentation

## 2016-10-01 DIAGNOSIS — E1142 Type 2 diabetes mellitus with diabetic polyneuropathy: Secondary | ICD-10-CM | POA: Insufficient documentation

## 2016-10-01 DIAGNOSIS — J45991 Cough variant asthma: Secondary | ICD-10-CM

## 2016-10-01 DIAGNOSIS — Z79899 Other long term (current) drug therapy: Secondary | ICD-10-CM | POA: Insufficient documentation

## 2016-10-01 DIAGNOSIS — Z6837 Body mass index (BMI) 37.0-37.9, adult: Secondary | ICD-10-CM | POA: Insufficient documentation

## 2016-10-01 DIAGNOSIS — E1140 Type 2 diabetes mellitus with diabetic neuropathy, unspecified: Secondary | ICD-10-CM | POA: Insufficient documentation

## 2016-10-01 DIAGNOSIS — E118 Type 2 diabetes mellitus with unspecified complications: Secondary | ICD-10-CM | POA: Insufficient documentation

## 2016-10-01 DIAGNOSIS — E1165 Type 2 diabetes mellitus with hyperglycemia: Secondary | ICD-10-CM

## 2016-10-01 LAB — GLUCOSE, POCT (MANUAL RESULT ENTRY): POC GLUCOSE: 295 mg/dL — AB (ref 70–99)

## 2016-10-01 MED ORDER — AMITRIPTYLINE HCL 25 MG PO TABS: ORAL_TABLET | ORAL | 1 refills | Status: DC

## 2016-10-01 MED ORDER — GABAPENTIN 300 MG PO CAPS: 600.0000 mg | ORAL_CAPSULE | Freq: Three times a day (TID) | ORAL | 5 refills | Status: DC

## 2016-10-01 MED ORDER — MEDICAL COMPRESSION STOCKINGS MISC
0 refills | Status: DC
Start: 2016-10-01 — End: 2016-12-30

## 2016-10-01 MED ORDER — INSULIN GLARGINE 100 UNIT/ML ~~LOC~~ SOLN: 60.0000 [IU] | Freq: Every day | SUBCUTANEOUS | 11 refills | Status: DC

## 2016-10-01 MED ORDER — INSULIN ASPART 100 UNIT/ML ~~LOC~~ SOLN: SUBCUTANEOUS | 4 refills | Status: DC

## 2016-10-01 MED ORDER — GUAIFENESIN ER 600 MG PO TB12: 600.0000 mg | ORAL_TABLET | Freq: Two times a day (BID) | ORAL | 5 refills | Status: DC | PRN

## 2016-10-01 MED ORDER — ACETAMINOPHEN-CODEINE #4 300-60 MG PO TABS: 1.0000 | ORAL_TABLET | Freq: Three times a day (TID) | ORAL | 0 refills | Status: DC | PRN

## 2016-10-01 MED ORDER — METFORMIN HCL 1000 MG PO TABS: 1000.0000 mg | ORAL_TABLET | Freq: Two times a day (BID) | ORAL | 3 refills | Status: DC

## 2016-10-01 NOTE — Patient Instructions (Addendum)
Carla Hicks was seen today for diabetes.  Diagnoses and all orders for this visit:  Uncontrolled type 2 diabetes mellitus with complication, with long-term current use of insulin (HCC) -     Glucose (CBG) -     metFORMIN (GLUCOPHAGE) 1000 MG tablet; Take 1 tablet (1,000 mg total) by mouth 2 (two) times daily with a meal. -     insulin aspart (NOVOLOG) 100 UNIT/ML injection; Inject 10 U with breakfast and  lunch, 15 U with dinner -     insulin glargine (LANTUS) 100 UNIT/ML injection; Inject 0.6 mLs (60 Units total) into the skin at bedtime.  Cough variant asthma vs UACS  -     guaiFENesin (MUCINEX) 600 MG 12 hr tablet; Take 1 tablet (600 mg total) by mouth 2 (two) times daily as needed for cough or to loosen phlegm.  Scar of lower leg -     Ambulatory referral to Dermatology  Diabetic polyneuropathy associated with type 2 diabetes mellitus (HCC) -     gabapentin (NEURONTIN) 300 MG capsule; Take 2 capsules (600 mg total) by mouth 3 (three) times daily. -     acetaminophen-codeine (TYLENOL #4) 300-60 MG tablet; Take 1 tablet by mouth every 8 (eight) hours as needed for moderate pain. -     amitriptyline (ELAVIL) 25 MG tablet; Take by mouth 25 mg nightly for first week, then 50 mg nightly for second week, then 75 mg nightly  Other orders -     Cancel: POCT glucose (manual entry)   Stop levemir Change to once daily lantus  Tylenol #4 for cough and pain, this has codeine in it so longer need cough medicine with codeine Instead take mucinex by itself, prescribed  For diabetic neuropathy: Tylenol #4 Gabapentin Elavil nightly, taper up weekly   Diabetes blood sugar goals  Fasting (in AM before breakfast, 8 hrs of no eating or drinking (except water or unsweetened coffee or tea): 90-110 2 hrs after meals: < 160,   No low sugars: nothing < 70   F/u in 2-3  weeks for diabetes and foot pain   Dr. Armen PickupFunches

## 2016-10-01 NOTE — Progress Notes (Signed)
F/u diabetes.  Back pain x 1 week, thinks r/t back pain. Bilateral foot pain, achy, numbness x since may. Rt foot edema since may. Tried/failed 800mg  ibuprofen, tylenol, gabapentin.  Needs to know if she should re-start metformin/glipizide.  Also would like refill of cough syrup she has been taking more than routine; advised may need a controlled  Substance contract.

## 2016-10-01 NOTE — Progress Notes (Signed)
Subjective:  Patient ID: Carla Hicks, female    DOB: May 06, 1971  Age: 45 y.o. MRN: [id number]  CC: Diabetes   HPI Carla Hicks presents for   1. CHRONIC DIABETES  Disease Monitoring  Blood Sugar Ranges: 53- Hi   Polyuria: no   Visual problems: no   Medication Compliance: yes  Medication Side Effects  Hypoglycemia: yes, sugar was 53, 4 days ago around 11 AM. She became dizzy and fell. She hit the back of her head.  No loss of consciousness.   Preventitive Health Care  Eye Exam: due   Foot Exam: done today   Diet pattern: eat boiled egg and slice toast in morning.  lunch tossed salad, pot pie, egg roll. Dinner rotisserie chicken, spinach and  cornbread.   Exercise: none  2. Foot pain: severe since May 2017. Has swelling sensation in R foot and tingling in her foot. She has numbness in both feet.   Social History  Substance Use Topics  . Smoking status: Current Every Day Smoker    Packs/day: 0.50    Years: 25.00    Types: Cigarettes    Last attempt to quit: 12/22/2011  . Smokeless tobacco: Never Used  . Alcohol use No     Comment: states quit in May 2017    Outpatient Medications Prior to Visit  Medication Sig Dispense Refill  . albuterol (PROVENTIL HFA;VENTOLIN HFA) 108 (90 Base) MCG/ACT inhaler Inhale 2 puffs into the lungs every 4 (four) hours as needed for wheezing or shortness of breath. 54 g 3  . atorvastatin (LIPITOR) 20 MG tablet TAKE 1 TABLET BY MOUTH DAILY AT 6 PM 30 tablet 5  . gemfibrozil (LOPID) 600 MG tablet Take 1 tablet (600 mg total) by mouth 2 (two) times daily before a meal. 60 tablet 12  . guaiFENesin-codeine (ROBITUSSIN AC) 100-10 MG/5ML syrup Take 5 mLs by mouth at bedtime as needed for cough. 120 mL 0  . ibuprofen (ADVIL) 200 MG tablet Take 1-2 tablets (200-400 mg total) by mouth every 6 (six) hours as needed for mild pain or moderate pain. 30 tablet 0  . insulin aspart (NOVOLOG) 100 UNIT/ML injection Inject 10 Units into the skin 3 (three)  times daily with meals. 10 mL 4  . insulin detemir (LEVEMIR) 100 UNIT/ML injection Inject 0.3 mLs (30 Units total) into the skin 2 (two) times daily. 60 mL 3  . predniSONE (DELTASONE) 20 MG tablet Take 20 mg by mouth daily.  3  . triamcinolone ointment (KENALOG) 0.5 % Apply 1 application topically 2 (two) times daily. 30 g 0  . albuterol (PROVENTIL) (5 MG/ML) 0.5% nebulizer solution Take 0.5 mLs (2.5 mg total) by nebulization every 4 (four) hours as needed for wheezing or shortness of breath. Sob (Patient taking differently: Take 2.5 mg by nebulization every 4 (four) hours. Sob) 20 mL 5  . blood glucose meter kit and supplies KIT Dispense based on patient and insurance preference. Use up to four times daily as directed. (FOR ICD-9 250.00, 250.01). 1 each 0  . budesonide (PULMICORT) 0.5 MG/2ML nebulizer solution Take 0.5 mg by nebulization every 4 (four) hours.    . budesonide-formoterol (SYMBICORT) 80-4.5 MCG/ACT inhaler Take 2 puffs first thing in am and then another 2 puffs about 12 hours later. (Patient not taking: Reported on 08/21/2016) 3 Inhaler 3  . cloNIDine (CATAPRES) 0.1 MG tablet Take 1 tablet (0.1 mg total) by mouth 2 (two) times daily. 60 tablet 11  . diclofenac sodium (VOLTAREN)  1 % GEL Apply 1 application topically 4 (four) times daily. (Patient taking differently: Apply 1 application topically 4 (four) times daily as needed (pain.). ) 100 g 0  . gabapentin (NEURONTIN) 300 MG capsule TAKE 1 CAPSULE BY MOUTH 2 TIMES DAILY 60 capsule 2  . glucose blood test strip Use as instructed 100 each 12  . INSULIN SYRINGE 1CC/29G 29G X 1/2" 1 ML MISC 1 Syringe by Does not apply route 4 (four) times daily - after meals and at bedtime. 90 each 0  . Lancets MISC 1 Stick by Does not apply route 4 (four) times daily -  with meals and at bedtime. 120 each 0  . Multiple Vitamin (MULTIVITAMIN WITH MINERALS) TABS tablet Take 1 tablet by mouth daily.    . nitrofurantoin, macrocrystal-monohydrate, (MACROBID) 100  MG capsule Take 1 capsule (100 mg total) by mouth 2 (two) times daily. 10 capsule 0  . oxyCODONE (OXY IR/ROXICODONE) 5 MG immediate release tablet Take 5 mg by mouth every 6 (six) hours as needed for moderate pain or severe pain.    . pantoprazole (PROTONIX) 40 MG tablet Take 1 tablet (40 mg total) by mouth daily. Take 30-60 min before first meal of the day 30 tablet 2   No facility-administered medications prior to visit.     ROS Review of Systems  Constitutional: Negative for chills and fever.  Eyes: Negative for visual disturbance.  Respiratory: Negative for cough and shortness of breath.   Cardiovascular: Positive for leg swelling. Negative for chest pain.  Gastrointestinal: Negative for abdominal pain and blood in stool.  Endocrine: Positive for polyuria.  Genitourinary: Negative for dysuria.  Musculoskeletal: Positive for myalgias. Negative for arthralgias and back pain.  Skin: Positive for wound. Negative for rash.  Allergic/Immunologic: Negative for immunocompromised state.  Hematological: Negative for adenopathy. Does not bruise/bleed easily.  Psychiatric/Behavioral: Negative for dysphoric mood and suicidal ideas.    Objective:  BP (!) 156/94   Pulse 74   Temp 98.9 F (37.2 C) (Oral)   Resp 20   Ht 5' 4"  (1.626 m)   Wt 221 lb (100.2 kg)   LMP 09/22/2016   BMI 37.93 kg/m   BP/Weight 10/01/2016 12/25/7260 0/02/5596  Systolic BP 416 384 536  Diastolic BP 94 80 80  Wt. (Lbs) 221 229.6 235.2  BMI 37.93 39.41 40.35    Physical Exam  Constitutional: She is oriented to person, place, and time. She appears well-developed and well-nourished. No distress.  Obese   HENT:  Head: Normocephalic and atraumatic.  Cardiovascular: Normal rate, regular rhythm, normal heart sounds and intact distal pulses.   Pulmonary/Chest: Effort normal and breath sounds normal.  Musculoskeletal: She exhibits edema (trace b/l LE ).       Left lower leg: She exhibits tenderness (no palpable cords  ).       Legs: Neurological: She is alert and oriented to person, place, and time.  Skin: Skin is warm and dry. No rash noted.  Psychiatric: She has a normal mood and affect.    Lab Results  Component Value Date   HGBA1C 11.1 08/21/2016   CBG 295  Assessment & Plan:   Charlene was seen today for diabetes.  Diagnoses and all orders for this visit:  Uncontrolled type 2 diabetes mellitus with complication, with long-term current use of insulin (HCC) -     Glucose (CBG) -     metFORMIN (GLUCOPHAGE) 1000 MG tablet; Take 1 tablet (1,000 mg total) by mouth 2 (two) times daily  with a meal. -     insulin aspart (NOVOLOG) 100 UNIT/ML injection; Inject 10 U with breakfast and  lunch, 15 U with dinner -     insulin glargine (LANTUS) 100 UNIT/ML injection; Inject 0.6 mLs (60 Units total) into the skin at bedtime.  Cough variant asthma vs UACS  -     guaiFENesin (MUCINEX) 600 MG 12 hr tablet; Take 1 tablet (600 mg total) by mouth 2 (two) times daily as needed for cough or to loosen phlegm.  Scar of lower leg -     Ambulatory referral to Dermatology  Diabetic polyneuropathy associated with type 2 diabetes mellitus ([place]) -     gabapentin (NEURONTIN) 300 MG capsule; Take 2 capsules (600 mg total) by mouth 3 (three) times daily. -     acetaminophen-codeine (TYLENOL #4) 300-60 MG tablet; Take 1 tablet by mouth every 8 (eight) hours as needed for moderate pain. -     amitriptyline (ELAVIL) 25 MG tablet; Take by mouth 25 mg nightly for first week, then 50 mg nightly for second week, then 75 mg nightly -     Elastic Bandages & Supports (MEDICAL COMPRESSION STOCKINGS) MISC; 25-30 mmHg pressure to be worn daily, knee high stockings  Other orders -     Cancel: POCT glucose (manual entry)    No orders of the defined types were placed in this encounter.   Follow-up: Return in about 3 weeks (around 10/22/2016) for diabetes and diabetic neuropathy .   Boykin Nearing MD

## 2016-10-02 MED FILL — ACETAMINOPHEN/COD #4 TABLET: 300-60 | 30 days supply | Qty: 90 | Fill #0

## 2016-10-06 NOTE — Assessment & Plan Note (Signed)
Diabetic neuropathy with pain and swelling in feet Treat per orders Control CBGs

## 2016-10-06 NOTE — Assessment & Plan Note (Signed)
Stop cough medicine with codeine since patient taking tylenol#4 mucinex ordered

## 2016-10-06 NOTE — Assessment & Plan Note (Signed)
Still uncontrolled with labile CBGs Increase dinner novolog to 15 U, 10 with breakfast and lunch Change levemir to lantus 60 U once daily

## 2016-10-06 NOTE — Assessment & Plan Note (Signed)
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

## 2016-10-12 MED FILL — AMITRIPTYLINE HCL 25 MG TAB: 25 | 37 days supply | Qty: 90 | Fill #0

## 2016-10-12 MED FILL — GABAPENTIN 300 MG CAPSULE: 300 | 30 days supply | Qty: 180 | Fill #0

## 2016-10-12 MED FILL — !LANTUS 100 UNITS/ML VIAL: 100 | 33 days supply | Qty: 20 | Fill #0

## 2016-10-12 MED FILL — !NOVOLOG 100UNITS/ML VIAL: 100/ML | 28 days supply | Qty: 10 | Fill #0

## 2016-10-12 MED FILL — metFORMIN HCL 1000 MG TABS: 1000 | 30 days supply | Qty: 60 | Fill #0

## 2016-11-05 ENCOUNTER — Encounter (HOSPITAL_COMMUNITY): Payer: Self-pay | Admitting: Emergency Medicine

## 2016-11-05 ENCOUNTER — Emergency Department (HOSPITAL_COMMUNITY): Payer: Self-pay

## 2016-11-05 ENCOUNTER — Emergency Department (HOSPITAL_COMMUNITY)
Admission: EM | Admit: 2016-11-05 | Discharge: 2016-11-06 | Disposition: A | Discharge status: Home / Self Care | Payer: Self-pay | Attending: Emergency Medicine | Admitting: Emergency Medicine

## 2016-11-05 DIAGNOSIS — I110 Hypertensive heart disease with heart failure: Secondary | ICD-10-CM | POA: Insufficient documentation

## 2016-11-05 DIAGNOSIS — Z794 Long term (current) use of insulin: Secondary | ICD-10-CM | POA: Insufficient documentation

## 2016-11-05 DIAGNOSIS — I5032 Chronic diastolic (congestive) heart failure: Secondary | ICD-10-CM | POA: Insufficient documentation

## 2016-11-05 DIAGNOSIS — F17210 Nicotine dependence, cigarettes, uncomplicated: Secondary | ICD-10-CM | POA: Insufficient documentation

## 2016-11-05 DIAGNOSIS — J4541 Moderate persistent asthma with (acute) exacerbation: Secondary | ICD-10-CM

## 2016-11-05 DIAGNOSIS — R0902 Hypoxemia: Secondary | ICD-10-CM

## 2016-11-05 DIAGNOSIS — E1140 Type 2 diabetes mellitus with diabetic neuropathy, unspecified: Secondary | ICD-10-CM | POA: Insufficient documentation

## 2016-11-05 DIAGNOSIS — R0602 Shortness of breath: Secondary | ICD-10-CM

## 2016-11-05 LAB — BASIC METABOLIC PANEL
ANION GAP: 10 (ref 5–15)
BUN: 6 mg/dL (ref 6–20)
CHLORIDE: 101 mmol/L (ref 101–111)
CO2: 22 mmol/L (ref 22–32)
CREATININE: 0.74 mg/dL (ref 0.44–1.00)
Calcium: 8.7 mg/dL — ABNORMAL LOW (ref 8.9–10.3)
GFR calc non Af Amer: >60 mL/min (ref >60)
Glucose, Bld: 278 mg/dL — ABNORMAL HIGH (ref 65–99)
Potassium: 4 mmol/L (ref 3.5–5.1)
Sodium: 133 mmol/L — ABNORMAL LOW (ref 135–145)

## 2016-11-05 LAB — CBC WITH DIFFERENTIAL/PLATELET
BASOS PCT: 0 %
Basophils Absolute: 0 10*3/uL (ref 0.0–0.1)
Eosinophils Absolute: 0.2 10*3/uL (ref 0.0–0.7)
Eosinophils Relative: 2 %
HEMATOCRIT: 34.1 % — AB (ref 36.0–46.0)
HEMOGLOBIN: 11.7 g/dL — AB (ref 12.0–15.0)
Lymphocytes Relative: 12 %
Lymphs Abs: 1.5 10*3/uL (ref 0.7–4.0)
MCH: 29.9 pg (ref 26.0–34.0)
MCHC: 34.3 g/dL (ref 30.0–36.0)
MCV: 87.2 fL (ref 78.0–100.0)
MONOS PCT: 2 %
Monocytes Absolute: 0.3 10*3/uL (ref 0.1–1.0)
NEUTROS ABS: 10.1 10*3/uL — AB (ref 1.7–7.7)
NEUTROS PCT: 84 %
Platelets: 295 10*3/uL (ref 150–400)
RBC: 3.91 MIL/uL (ref 3.87–5.11)
RDW: 13.1 % (ref 11.5–15.5)
WBC: 12.1 10*3/uL — ABNORMAL HIGH (ref 4.0–10.5)

## 2016-11-05 LAB — I-STAT TROPONIN, ED: Troponin i, poc: 0 ng/mL (ref 0.00–0.08)

## 2016-11-05 LAB — BRAIN NATRIURETIC PEPTIDE: B NATRIURETIC PEPTIDE 5: 73.9 pg/mL (ref 0.0–100.0)

## 2016-11-05 MED ORDER — ALBUTEROL SULFATE (2.5 MG/3ML) 0.083% IN NEBU
5.0000 mg | INHALATION_SOLUTION | Freq: Once | RESPIRATORY_TRACT | Status: AC
  Administered 2016-11-05: 5 mg via RESPIRATORY_TRACT
  Filled 2016-11-05: qty 6

## 2016-11-05 MED ORDER — ALBUTEROL (5 MG/ML) CONTINUOUS INHALATION SOLN
INHALATION_SOLUTION | RESPIRATORY_TRACT | Status: AC
  Filled 2016-11-05: qty 20

## 2016-11-05 MED ORDER — ALBUTEROL (5 MG/ML) CONTINUOUS INHALATION SOLN
10.0000 mg/h | INHALATION_SOLUTION | RESPIRATORY_TRACT | Status: AC
  Administered 2016-11-05: 10 mg/h via RESPIRATORY_TRACT

## 2016-11-05 MED ORDER — MAGNESIUM SULFATE 2 GM/50ML IV SOLN
2.0000 g | Freq: Once | INTRAVENOUS | Status: AC
  Administered 2016-11-05: 2 g via INTRAVENOUS
  Filled 2016-11-05: qty 50

## 2016-11-05 NOTE — ED Notes (Signed)
Patient ambulatory to restroom.  Gave patient specimen cup for urine.

## 2016-11-05 NOTE — ED Triage Notes (Signed)
Per EMS from home c/o SOB/ wheezing in all fields, diminished lung sounds lower right side. Diagnosed with pneumonia a few months ago. 15 albuterol 1 Atrovent 125 solumedrol given en route.

## 2016-11-05 NOTE — ED Provider Notes (Signed)
[name] DEPT Provider Note   CSN: [id number] Arrival date & time: 11/05/16  2120     History   Chief Complaint Chief Complaint  Patient presents with  . Shortness of Breath    HPI Carla Hicks is a 45 y.o. female.  The history is provided by the patient and medical records.  Shortness of Breath  Associated symptoms include wheezing.    45 y.o. F with hx of asthma and DM, presenting to the ED for SOB.  Patient states she's been feeling short of breath for the past few days, worse today. States she has been using her home albuterol nebulizers as well as inhalers without any noted relief. She denies any recent illness, fever, chills, or cough. She denies any chest pain. States she did have pneumonia a few months ago. Patient received no neurologic treatments and Solu-Medrol with EMS without significant improvement.  She did have desaturations to 89% on RA here, improved with 2L supplemental O2.  She states she was previously on home oxygen, however when she moved to [facility] the doctor she saw here did not represcribed that for her. States she has been admitted for her asthma in the past. No prior intubations.  Past Medical History:  Diagnosis Date  . Asthma   . Diabetes mellitus without complication [facility])     Patient Active Problem List   Diagnosis Date Noted  . Diabetic neuropathy associated with type 2 diabetes mellitus ([place]) 10/01/2016  . Scar of lower leg 08/25/2016  . Calf pain 08/25/2016  . Dysuria 08/25/2016  . Alcohol abuse, in remission 05/09/2016  . Chronic diastolic CHF (congestive heart failure) ([place]) 04/30/2016  . Alcohol-induced chronic pancreatitis ([place])   . Uncontrolled type 2 diabetes mellitus with complication ([place])   . Pulmonary hypertension   . Severe obesity (BMI >= 40) ([place]) 12/28/2015  . Dyslipidemia associated with type 2 diabetes mellitus ([place]) 12/16/2015  . Cough variant asthma vs UACS  07/18/2015  . Essential hypertension, benign  10/24/2014  . Alpha-1-antitrypsin deficiency reported by pt but ruled out 08/01/15 with MM phenotype 10/24/2014    Past Surgical History:  Procedure Laterality Date  . CESAREAN SECTION      OB History    Gravida Para Term Preterm AB Living   _0 SAB TAB Ectopic Multiple Live Births                   Home Medications    Prior to Admission medications   Medication Sig Start Date End Date Taking? Authorizing Provider  acetaminophen-codeine (TYLENOL #4) 300-60 MG tablet Take 1 tablet by mouth every 8 (eight) hours as needed for moderate pain. 10/01/16   Josalyn Funches, MD  albuterol (PROVENTIL HFA;VENTOLIN HFA) 108 (90 Base) MCG/ACT inhaler Inhale 2 puffs into the lungs every 4 (four) hours as needed for wheezing or shortness of breath. 05/26/16   Josalyn Funches, MD  albuterol (PROVENTIL) (5 MG/ML) 0.5% nebulizer solution Take 0.5 mLs (2.5 mg total) by nebulization every 4 (four) hours as needed for wheezing or shortness of breath. Sob Patient taking differently: Take 2.5 mg by nebulization every 4 (four) hours. Sob 04/24/15   Micheline Chapman, NP  amitriptyline (ELAVIL) 25 MG tablet Take by mouth 25 mg nightly for first week, then 50 mg nightly for second week, then 75 mg nightly 10/01/16   Josalyn Funches, MD  atorvastatin (LIPITOR) 20 MG tablet TAKE 1 TABLET BY MOUTH DAILY AT 6  PM 08/12/16   Boykin Nearing, MD  blood glucose meter kit and supplies KIT Dispense based on patient and insurance preference. Use up to four times daily as directed. (FOR ICD-9 250.00, 250.01). 05/01/16   Cherene Altes, MD  budesonide (PULMICORT) 0.5 MG/2ML nebulizer solution Take 0.5 mg by nebulization every 4 (four) hours.    Historical Provider, MD  budesonide-formoterol (SYMBICORT) 80-4.5 MCG/ACT inhaler Take 2 puffs first thing in am and then another 2 puffs about 12 hours later. Patient not taking: Reported on 08/21/2016 05/26/16   Boykin Nearing, MD  cloNIDine (CATAPRES) 0.1 MG tablet Take 1  tablet (0.1 mg total) by mouth 2 (two) times daily. 04/21/16   Nita Sells, MD  diclofenac sodium (VOLTAREN) 1 % GEL Apply 1 application topically 4 (four) times daily. Patient taking differently: Apply 1 application topically 4 (four) times daily as needed (pain.).  05/30/15   Janne Napoleon, NP  Elastic Bandages & Supports (MEDICAL COMPRESSION STOCKINGS) MISC 25-30 mmHg pressure to be worn daily, knee high stockings 10/01/16   Boykin Nearing, MD  gabapentin (NEURONTIN) 300 MG capsule Take 2 capsules (600 mg total) by mouth 3 (three) times daily. 10/01/16   Josalyn Funches, MD  gemfibrozil (LOPID) 600 MG tablet Take 1 tablet (600 mg total) by mouth 2 (two) times daily before a meal. 04/21/16   Nita Sells, MD  glucose blood test strip Use as instructed 09/23/16   Boykin Nearing, MD  guaiFENesin (MUCINEX) 600 MG 12 hr tablet Take 1 tablet (600 mg total) by mouth 2 (two) times daily as needed for cough or to loosen phlegm. 10/01/16   Josalyn Funches, MD  ibuprofen (ADVIL) 200 MG tablet Take 1-2 tablets (200-400 mg total) by mouth every 6 (six) hours as needed for mild pain or moderate pain. 05/01/16   Cherene Altes, MD  insulin aspart (NOVOLOG) 100 UNIT/ML injection Inject 10 U with breakfast and  lunch, 15 U with dinner 10/01/16   Josalyn Funches, MD  insulin glargine (LANTUS) 100 UNIT/ML injection Inject 0.6 mLs (60 Units total) into the skin at bedtime. 10/01/16   Josalyn Funches, MD  INSULIN SYRINGE 1CC/29G 29G X 1/2" 1 ML MISC 1 Syringe by Does not apply route 4 (four) times daily - after meals and at bedtime. 04/21/16   Nita Sells, MD  Lancets MISC 1 Stick by Does not apply route 4 (four) times daily -  with meals and at bedtime. 05/01/16   Cherene Altes, MD  metFORMIN (GLUCOPHAGE) 1000 MG tablet Take 1 tablet (1,000 mg total) by mouth 2 (two) times daily with a meal. 10/01/16   Boykin Nearing, MD  Multiple Vitamin (MULTIVITAMIN WITH MINERALS) TABS tablet Take 1 tablet by  mouth daily.    Historical Provider, MD  pantoprazole (PROTONIX) 40 MG tablet Take 1 tablet (40 mg total) by mouth daily. Take 30-60 min before first meal of the day 09/18/15   Brayton Caves, PA-C  predniSONE (DELTASONE) 20 MG tablet Take 20 mg by mouth daily. 04/21/16   Historical Provider, MD  triamcinolone ointment (KENALOG) 0.5 % Apply 1 application topically 2 (two) times daily. 08/21/16   Boykin Nearing, MD    Family History Family History  Problem Relation Age of Onset  . Emphysema Mother     smoked  . Allergies Mother   . Asthma Mother   . Breast cancer Mother   . Uterine cancer Mother   . Diabetes Father   . Heart disease Maternal Grandmother  Social History Social History  Substance Use Topics  . Smoking status: Current Every Day Smoker    Packs/day: 0.50    Years: 25.00    Types: Cigarettes    Last attempt to quit: 12/22/2011  . Smokeless tobacco: Never Used  . Alcohol use No     Comment: states quit in May 2017     Allergies   Penicillins and Robitussin liquid center [menthol]   Review of Systems Review of Systems  Respiratory: Positive for shortness of breath and wheezing.   All other systems reviewed and are negative.    Physical Exam Updated Vital Signs BP 123/70 (BP Location: Left Arm)   Pulse 113   Temp 98.9 F (37.2 C) (Oral)   Resp 20   Ht _0  (1.626 m)   Wt 103 kg   LMP  (LMP Unknown)   SpO2 94%   BMI 38.96 kg/m   Physical Exam  Constitutional: She is oriented to person, place, and time. She appears well-developed and well-nourished.  Obese  HENT:  Head: Normocephalic and atraumatic.  Mouth/Throat: Oropharynx is clear and moist.  Eyes: Conjunctivae and EOM are normal. Pupils are equal, round, and reactive to light.  Neck: Normal range of motion.  Cardiovascular: Normal rate, regular rhythm and normal heart sounds.   Pulmonary/Chest: Effort normal. She has wheezes.  Inspiratory and expiratory wheezes throughout, speaking in short,  truncated sentences  Abdominal: Soft. Bowel sounds are normal.  Musculoskeletal: Normal range of motion.  No pitting edema No calf tenderness or swelling, no palpable cords  Neurological: She is alert and oriented to person, place, and time.  Skin: Skin is warm and dry.  Psychiatric: She has a normal mood and affect.  Nursing note and vitals reviewed.    ED Treatments / Results  Labs (all labs ordered are listed, but only abnormal results are displayed) Labs Reviewed  CBC WITH DIFFERENTIAL/PLATELET - Abnormal; Notable for the following:       Result Value   WBC 12.1 (*)    Hemoglobin 11.7 (*)    HCT 34.1 (*)    Neutro Abs 10.1 (*)    All other components within normal limits  BASIC METABOLIC PANEL - Abnormal; Notable for the following:    Sodium 133 (*)    Glucose, Bld 278 (*)    Calcium 8.7 (*)    All other components within normal limits  BRAIN NATRIURETIC PEPTIDE  I-STAT TROPOININ, ED    EKG  EKG Interpretation  Date/Time:  Thursday November 05 2016 22:37:53 EST Ventricular Rate:  117 PR Interval:    QRS Duration: 140 QT Interval:  356 QTC Calculation: 497 R Axis:   -69 Text Interpretation:  Sinus tachycardia RBBB and LAFB Probable left ventricular hypertrophy Confirmed by Hazle Coca [phone]) on 11/05/2016 10:40:51 PM       Radiology Dg Chest 2 View  Result Date: 11/05/2016 CLINICAL DATA:  Acute onset of shortness of breath. Initial encounter. EXAM: CHEST  2 VIEW COMPARISON:  Chest radiograph performed 05/19/2016 FINDINGS: The lungs are well-aerated. Vascular congestion is noted. Increased interstitial markings may reflect mild interstitial edema. There is no evidence of pleural effusion or pneumothorax. The heart is mildly enlarged. No acute osseous abnormalities are seen. IMPRESSION: Vascular congestion and mild cardiomegaly. Increased interstitial markings may reflect mild interstitial edema. Electronically Signed   By: Garald Balding M.D.   On: 11/05/2016 21:56     Procedures Procedures (including critical care time)  CRITICAL CARE Performed by: Quincy Carnes  M   Total critical care time: 45 minutes  Critical care time was exclusive of separately billable procedures and treating other patients.  Critical care was necessary to treat or prevent imminent or life-threatening deterioration.  Critical care was time spent personally by me on the following activities: development of treatment plan with patient and/or surrogate as well as nursing, discussions with consultants, evaluation of patient's response to treatment, examination of patient, obtaining history from patient or surrogate, ordering and performing treatments and interventions, ordering and review of laboratory studies, ordering and review of radiographic studies, pulse oximetry and re-evaluation of patient's condition.   Medications Ordered in ED Medications  albuterol (PROVENTIL,VENTOLIN) solution continuous neb (0 mg/hr Nebulization Stopped 11/05/16 2337)  magnesium sulfate IVPB 2 g 50 mL (2 g Intravenous New Bag/Given 11/05/16 2334)  albuterol (PROVENTIL, VENTOLIN) (5 MG/ML) 0.5% continuous inhalation solution (  Not Given 11/05/16 2216)  albuterol (PROVENTIL) (2.5 MG/3ML) 0.083% nebulizer solution 5 mg (5 mg Nebulization Given 11/05/16 2203)     Initial Impression / Assessment and Plan / ED Course  I have reviewed the triage vital signs and the nursing notes.  Pertinent labs & imaging results that were available during my care of the patient were reviewed by me and considered in my medical decision making (see chart for details).  Clinical Course    45 y.o. F here with SOB.  Hx of asthma.  She is afebrile, non-toxic.  She does have increased work of breathing, speaking in short, truncated sentences.  Wheezes throughout.  Patient given nebs and solu-medrol with EMS, minimal improvement.  Will start hour long neb.  Labs and CXR pending.  IV Mg+ also given.  Work-up overall  reassuring.  CXR with questionable vascular congestion, however BNP normal.  No clinical signs of fluid overload on exam.  EKG with sinus tach which I suspect is from large amounts of albuterol.   Hour long neb finishing.  Will reassess  11:50 PM  Patient reassessed.  Lung sounds have greatly improved.  Patient states she is feeling better.  She was removed from supplemental O2 and continues desaturating down to 88-89% on RA while sitting.  Denies SOB when this happens.  Corrects quickly when O2 reapplied.  Discussed admission for asthma exacerbation, however patient states she absolutely cannot stay as she has to babysit her grandchildren in the morning.  She reports that she has a PCP appt tomorrow at 4pm and can get re-checked there.  Patient states she understands the risks of leaving with hypoxia including worsening hypoxia, decompensation, respiratory failure, or even death.  She acknowledged these risks and still wishes to leave.  Patient is AAOx3, capable of making her own medical decisions at this time.  Patient will sign out AMA.  Will send her with prednisone taper.  Strongly encouraged her to see her primary care doctor tomorrow as scheduled.  She was given strict return precautions for any new/worsening symptoms.  Final Clinical Impressions(s) / ED Diagnoses   Final diagnoses:  Shortness of breath  Moderate persistent asthma with acute exacerbation  Hypoxia    New Prescriptions New Prescriptions   PREDNISONE (DELTASONE) 20 MG TABLET    Take 40 mg by mouth daily for 3 days, then 65m by mouth daily for 3 days, then 119mdaily for 3 days     LiLarene PickettPA-C 11/06/16 0029    ElQuintella ReichertMD 11/06/16 [phone]

## 2016-11-05 NOTE — ED Notes (Signed)
Blood collected and sent to lab.

## 2016-11-05 NOTE — ED Notes (Signed)
Bed: YN18 Expected date:  Expected time:  Means of arrival:  Comments: Wheezing, 45 yr old

## 2016-11-06 ENCOUNTER — Telehealth: Payer: Self-pay | Admitting: Family Medicine

## 2016-11-06 ENCOUNTER — Ambulatory Visit: Payer: Self-pay | Attending: Internal Medicine | Admitting: Physician Assistant

## 2016-11-06 ENCOUNTER — Encounter: Payer: Self-pay | Admitting: Physician Assistant

## 2016-11-06 VITALS — BP 158/104 | HR 93 | Temp 97.6°F | Resp 14 | Wt 226.2 lb

## 2016-11-06 DIAGNOSIS — Z9119 Patient's noncompliance with other medical treatment and regimen: Secondary | ICD-10-CM | POA: Insufficient documentation

## 2016-11-06 DIAGNOSIS — I10 Essential (primary) hypertension: Secondary | ICD-10-CM | POA: Insufficient documentation

## 2016-11-06 DIAGNOSIS — Z794 Long term (current) use of insulin: Secondary | ICD-10-CM | POA: Insufficient documentation

## 2016-11-06 DIAGNOSIS — E1165 Type 2 diabetes mellitus with hyperglycemia: Secondary | ICD-10-CM

## 2016-11-06 DIAGNOSIS — J4541 Moderate persistent asthma with (acute) exacerbation: Secondary | ICD-10-CM

## 2016-11-06 DIAGNOSIS — E118 Type 2 diabetes mellitus with unspecified complications: Secondary | ICD-10-CM

## 2016-11-06 LAB — GLUCOSE, POCT (MANUAL RESULT ENTRY)

## 2016-11-06 LAB — POCT URINALYSIS DIPSTICK
Bilirubin, UA: NEGATIVE
GLUCOSE UA: 500
Ketones, UA: 15
NITRITE UA: NEGATIVE
Protein, UA: NEGATIVE
Spec Grav, UA: <=1.005
UROBILINOGEN UA: 0.2
pH, UA: 5.5

## 2016-11-06 MED ORDER — AZITHROMYCIN 250 MG PO TABS: ORAL_TABLET | ORAL | 0 refills | Status: DC

## 2016-11-06 MED ORDER — GUAIFENESIN-CODEINE 300-10 MG/5ML PO LIQD: 10.0000 mL | Freq: Three times a day (TID) | ORAL | 1 refills | Status: DC

## 2016-11-06 MED ORDER — PREDNISONE 20 MG PO TABS: ORAL_TABLET | ORAL | 0 refills | Status: DC

## 2016-11-06 MED ORDER — INSULIN ASPART 100 UNIT/ML ~~LOC~~ SOLN
20.0000 [IU] | Freq: Once | SUBCUTANEOUS | Status: AC
  Administered 2016-11-06: 20 [IU] via SUBCUTANEOUS

## 2016-11-06 MED FILL — ?AZITHROMYCIN 250 MG TABLET: 250 | 5 days supply | Qty: 6 | Fill #0

## 2016-11-06 MED FILL — predniSONE 20 MG TABS: 20 | 30 days supply | Qty: 30 | Fill #0

## 2016-11-06 NOTE — Progress Notes (Signed)
Carla Hicks, is a 45 y.o. female  BLT:[id number]  AQT:[id number]  DOB - 01/01/1971  Subjective:  Chief Complaint and HPI: Carla Hicks is a 45 y.o. female here today for ED f/up from acute asthma exacerbation.  See yesterday's ED note.  They advised hospital admission and the patient refused.  Her breathing is much better today.  She was given solumedrol last night.  She does not check her sugars regularly and she is not always compliant with her medications.  Today, she feels much improved and is wheezing much less.   She has only had to use her inhaler a few times.  She did not pick up the prescription for prednisone taper because she says she is supposed to be on 51m prednisone daily for control of asthma and ran out about 2 weeks ago.  She cares for her 182year old grandchild.  Her blood sugar is too high to read in the office with 15 ketones in her urine.  She adamantly refuses going back to the hospital today.  Dr FAdrian Blackwaterand I both saw the patient and could not convince her to go back to the hospital.  We explained the risks even to including death with her.  She expresses that she understands that.  ED/Hospital notes reviewed.     ROS:   Constitutional:  No f/c, No night sweats, No unexplained weight loss. EENT:  No vision changes, No blurry vision, No hearing changes. No mouth, throat, or ear problems.  Respiratory: +cough, + SOB Cardiac: No CP, no palpitations GI:  No abd pain, No N/V/D. GU: No Urinary s/sx Musculoskeletal: No joint pain Neuro: No headache, no dizziness, no motor weakness.  Skin: No rash Endocrine:  No polydipsia. No polyuria.  Psych: Denies SI/HI  No problems updated.  ALLERGIES: Allergies  Allergen Reactions  . Penicillins Hives    Has patient had a PCN reaction causing immediate rash, facial/tongue/throat swelling, SOB or lightheadedness with hypotension: yes Has patient had a PCN reaction causing severe rash involving mucus membranes or skin  necrosis: no Has patient had a PCN reaction that required hospitalization no Has patient had a PCN reaction occurring within the last 10 years: no If all of the above answers are "NO", then may proceed with Cephalosporin use.   . RClyde[Menthol] Hives    PAST MEDICAL HISTORY: Past Medical History:  Diagnosis Date  . Asthma   . Diabetes mellitus without complication ([place]     MEDICATIONS AT HOME: Prior to Admission medications   Medication Sig Start Date End Date Taking? Authorizing Provider  acetaminophen-codeine (TYLENOL #4) 300-60 MG tablet Take 1 tablet by mouth every 8 (eight) hours as needed for moderate pain. Patient not taking: Reported on 11/05/2016 10/01/16   JBoykin Nearing MD  albuterol (PROVENTIL HFA;VENTOLIN HFA) 108 (90 Base) MCG/ACT inhaler Inhale 2 puffs into the lungs every 4 (four) hours as needed for wheezing or shortness of breath. 05/26/16   Josalyn Funches, MD  albuterol (PROVENTIL) (5 MG/ML) 0.5% nebulizer solution Take 0.5 mLs (2.5 mg total) by nebulization every 4 (four) hours as needed for wheezing or shortness of breath. Sob Patient taking differently: Take 2.5 mg by nebulization every 4 (four) hours. Sob 04/24/15   LMicheline Chapman NP  amitriptyline (ELAVIL) 25 MG tablet Take by mouth 25 mg nightly for first week, then 50 mg nightly for second week, then 75 mg nightly Patient taking differently: Take 75 mg by mouth at bedtime.  10/01/16  Josalyn Funches, MD  atorvastatin (LIPITOR) 20 MG tablet TAKE 1 TABLET BY MOUTH DAILY AT 6 PM 08/12/16   Boykin Nearing, MD  azithromycin (ZITHROMAX) 250 MG tablet Take 2 tabs today then 1 tab daily each day thereafter 11/06/16   Argentina Donovan, PA-C  blood glucose meter kit and supplies KIT Dispense based on patient and insurance preference. Use up to four times daily as directed. (FOR ICD-9 250.00, 250.01). 05/01/16   Cherene Altes, MD  budesonide (PULMICORT) 0.5 MG/2ML nebulizer solution Take 0.5 mg  by nebulization every 4 (four) hours.    Historical Provider, MD  budesonide-formoterol (SYMBICORT) 80-4.5 MCG/ACT inhaler Take 2 puffs first thing in am and then another 2 puffs about 12 hours later. Patient not taking: Reported on 08/21/2016 05/26/16   Boykin Nearing, MD  cloNIDine (CATAPRES) 0.1 MG tablet Take 1 tablet (0.1 mg total) by mouth 2 (two) times daily. 04/21/16   Nita Sells, MD  diclofenac sodium (VOLTAREN) 1 % GEL Apply 1 application topically 4 (four) times daily. Patient taking differently: Apply 1 application topically 4 (four) times daily as needed (pain.).  05/30/15   Janne Napoleon, NP  Elastic Bandages & Supports (MEDICAL COMPRESSION STOCKINGS) MISC 25-30 mmHg pressure to be worn daily, knee high stockings 10/01/16   Boykin Nearing, MD  gabapentin (NEURONTIN) 300 MG capsule Take 2 capsules (600 mg total) by mouth 3 (three) times daily. 10/01/16   Josalyn Funches, MD  gemfibrozil (LOPID) 600 MG tablet Take 1 tablet (600 mg total) by mouth 2 (two) times daily before a meal. Patient not taking: Reported on 11/05/2016 04/21/16   Nita Sells, MD  glucose blood test strip Use as instructed 09/23/16   Boykin Nearing, MD  guaiFENesin (MUCINEX) 600 MG 12 hr tablet Take 1 tablet (600 mg total) by mouth 2 (two) times daily as needed for cough or to loosen phlegm. Patient not taking: Reported on 11/05/2016 10/01/16   Boykin Nearing, MD  ibuprofen (ADVIL) 200 MG tablet Take 1-2 tablets (200-400 mg total) by mouth every 6 (six) hours as needed for mild pain or moderate pain. Patient not taking: Reported on 11/05/2016 05/01/16   Cherene Altes, MD  insulin aspart (NOVOLOG) 100 UNIT/ML injection Inject 10 U with breakfast and  lunch, 15 U with dinner 10/01/16   Boykin Nearing, MD  insulin glargine (LANTUS) 100 UNIT/ML injection Inject 0.6 mLs (60 Units total) into the skin at bedtime. 10/01/16   Josalyn Funches, MD  INSULIN SYRINGE 1CC/29G 29G X 1/2" 1 ML MISC 1 Syringe by Does not  apply route 4 (four) times daily - after meals and at bedtime. 04/21/16   Nita Sells, MD  Lancets MISC 1 Stick by Does not apply route 4 (four) times daily -  with meals and at bedtime. 05/01/16   Cherene Altes, MD  metFORMIN (GLUCOPHAGE) 1000 MG tablet Take 1 tablet (1,000 mg total) by mouth 2 (two) times daily with a meal. 10/01/16   Boykin Nearing, MD  Multiple Vitamin (MULTIVITAMIN WITH MINERALS) TABS tablet Take 1 tablet by mouth daily.    Historical Provider, MD  oxyCODONE-acetaminophen (PERCOCET) 10-325 MG tablet Take 1 tablet by mouth 2 (two) times daily as needed for pain.    Historical Provider, MD  pantoprazole (PROTONIX) 40 MG tablet Take 1 tablet (40 mg total) by mouth daily. Take 30-60 min before first meal of the day Patient not taking: Reported on 11/05/2016 09/18/15   Brayton Caves, PA-C  predniSONE (DELTASONE) 20 MG tablet  Take 47m daily 11/06/16   AArgentina Donovan PA-C  triamcinolone ointment (KENALOG) 0.5 % Apply 1 application topically 2 (two) times daily. 08/21/16   Josalyn Funches, MD     Objective:  EXAM:   Vitals:   11/06/16 1657  BP: (!) 158/104  Pulse: 93  Resp: 14  Temp: 97.6 F (36.4 C)  TempSrc: Oral  SpO2: 95%  Weight: 226 lb 3.2 oz (102.6 kg)    General appearance : A&OX3. NAD. Non-toxic-appearing.  NAD HEENT: Atraumatic and Normocephalic.  PERRLA. EOM intact.  TM clear B. Mouth-MMM, post pharynx WNL w/o erythema, No PND. Neck: supple, no JVD. No cervical lymphadenopathy. No thyromegaly Chest/Lungs:  Breathing-non-labored, there is mild wheezing throughout with good air movement.  No rales or rhonchi CVS: S1 S2 regular, no murmurs, gallops, rubs  Extremities: Bilateral Lower Ext shows no edema, both legs are warm to touch with = pulse throughout Neurology:  CN II-XII grossly intact, Non focal.   Psych:  TP semi-linear. J/I poor. Normal speech. Appropriate eye contact and affect.  Skin:  No Rash  Data Review Lab Results  Component  Value Date   HGBA1C 11.1 08/21/2016   HGBA1C 10.0 (H) 05/10/2016   HGBA1C 9.7 (H) 04/29/2016     Assessment & Plan   1. Uncontrolled type 2 diabetes mellitus with complication, unspecified long term insulin use status (HCC) Uncontrolled.  She really should go to the hospital.  Dr FAdrian Blackwaterand I both saw the patient and advised her returning to the ED because of the extremely high blood sugars, ketonuria, and concomitant illness that risks without adequate improvement could lead to death.  She expresses that she understands and will go to the hospital if she worsens from this point. - POCT glucose (manual entry) - POCT urinalysis dipstick - insulin aspart (novoLOG) injection 20 Units; Inject 0.2 mLs (20 Units total) into the skin once. Check sugars fasting and tid and record. Non-compliance has been an issue.  Take meds as directed.  Drink lots of water.  2. Moderate persistent asthma with acute exacerbation Continue nebs/albuterol as directed as needed.  Dr. FAdrian BlackwaterRF cough med today.  Start Zpack and resume prednisone at 226mdaily.  3.Htn-uncontrolled Compliance with meds imperative.  Patient have been counseled extensively about nutrition and exercise  Return in about 2 weeks (around 11/20/2016) for f/up with Dr FuAdrian Blackwateror diabetes and med management.  The patient was given clear instructions to go to ER or return to medical center if symptoms don't improve, worsen or new problems develop. The patient verbalized understanding. The patient was told to call to get lab results if they haven't heard anything in the next week.     AnFreeman CaldronPA-C [facility] [place] 11/06/2016, 5:18 PM

## 2016-11-06 NOTE — Discharge Instructions (Signed)
I have recommended that you be admitted for continued hypoxia (low oxygen levels) however you have chosen to leave. Recommend to continue home neb treatments, start prednisone. Follow-up with your primary care doctor tomorrow as scheduled. Should you change your mind or symptoms worsen, please return to the ED.

## 2016-11-06 NOTE — Telephone Encounter (Signed)
Patient had f/u visit today Chronic pain Not controlled with tylenol #4 Restart guaifenesin with codeine

## 2016-11-06 NOTE — Progress Notes (Signed)
Pt is in the office today for a ED follow up

## 2016-11-09 ENCOUNTER — Other Ambulatory Visit: Payer: Self-pay | Admitting: Family Medicine

## 2016-11-09 MED ORDER — GUAIFENESIN-CODEINE 100-10 MG/5ML PO SOLN: 10.0000 mL | Freq: Three times a day (TID) | ORAL | 1 refills | Status: DC | PRN

## 2016-11-27 MED FILL — GABAPENTIN 300 MG CAPSULE: 300 | 30 days supply | Qty: 180 | Fill #1

## 2016-11-27 MED FILL — AMITRIPTYLINE HCL 25 MG TAB: 25 | 37 days supply | Qty: 90 | Fill #1

## 2016-11-27 MED FILL — metFORMIN HCL 1000 MG TABS: 1000 | 30 days supply | Qty: 60 | Fill #1

## 2016-11-27 MED FILL — GUAIFENESIN AC COUGH SYRUP: 100-10 | 15 days supply | Qty: 473 | Fill #0

## 2016-12-02 ENCOUNTER — Ambulatory Visit: Payer: Self-pay | Attending: Internal Medicine | Admitting: Internal Medicine

## 2016-12-02 ENCOUNTER — Other Ambulatory Visit: Payer: Self-pay

## 2016-12-02 VITALS — BP 149/100 | HR 108 | Temp 98.1°F

## 2016-12-02 DIAGNOSIS — J45901 Unspecified asthma with (acute) exacerbation: Secondary | ICD-10-CM

## 2016-12-02 DIAGNOSIS — E119 Type 2 diabetes mellitus without complications: Secondary | ICD-10-CM | POA: Insufficient documentation

## 2016-12-02 DIAGNOSIS — R05 Cough: Secondary | ICD-10-CM | POA: Insufficient documentation

## 2016-12-02 DIAGNOSIS — J441 Chronic obstructive pulmonary disease with (acute) exacerbation: Secondary | ICD-10-CM

## 2016-12-02 DIAGNOSIS — Z794 Long term (current) use of insulin: Secondary | ICD-10-CM | POA: Insufficient documentation

## 2016-12-02 DIAGNOSIS — Z720 Tobacco use: Secondary | ICD-10-CM

## 2016-12-02 DIAGNOSIS — I10 Essential (primary) hypertension: Secondary | ICD-10-CM | POA: Insufficient documentation

## 2016-12-02 DIAGNOSIS — Z880 Allergy status to penicillin: Secondary | ICD-10-CM | POA: Insufficient documentation

## 2016-12-02 DIAGNOSIS — F17210 Nicotine dependence, cigarettes, uncomplicated: Secondary | ICD-10-CM | POA: Insufficient documentation

## 2016-12-02 DIAGNOSIS — J449 Chronic obstructive pulmonary disease, unspecified: Secondary | ICD-10-CM | POA: Insufficient documentation

## 2016-12-02 DIAGNOSIS — E8801 Alpha-1-antitrypsin deficiency: Secondary | ICD-10-CM

## 2016-12-02 MED ORDER — PREDNISONE 20 MG PO TABS: 60.0000 mg | ORAL_TABLET | Freq: Every day | ORAL | 0 refills | Status: DC

## 2016-12-02 MED ORDER — IPRATROPIUM-ALBUTEROL 0.5-2.5 (3) MG/3ML IN SOLN
3.0000 mL | Freq: Four times a day (QID) | RESPIRATORY_TRACT | Status: DC
  Administered 2016-12-02: 3 mL via RESPIRATORY_TRACT

## 2016-12-02 MED ORDER — IPRATROPIUM-ALBUTEROL 0.5-2.5 (3) MG/3ML IN SOLN: 3.0000 mL | Freq: Four times a day (QID) | RESPIRATORY_TRACT | 3 refills | Status: AC | PRN

## 2016-12-02 MED ORDER — AZITHROMYCIN 250 MG PO TABS: ORAL_TABLET | ORAL | 0 refills | Status: DC

## 2016-12-02 MED ORDER — BUDESONIDE-FORMOTEROL FUMARATE 80-4.5 MCG/ACT IN AERO
INHALATION_SPRAY | RESPIRATORY_TRACT | 3 refills | Status: DC
Start: 2016-12-02 — End: 2017-03-30

## 2016-12-02 MED FILL — ATORVASTATIN 20 MG TABLET: 20 | 30 days supply | Qty: 30 | Fill #2

## 2016-12-02 MED FILL — predniSONE 20 MG TABS: 20 | 10 days supply | Qty: 20 | Fill #0

## 2016-12-02 MED FILL — cloNIDine HCL 0.1 MG TABS: 0.1 | 30 days supply | Qty: 60 | Fill #4

## 2016-12-02 MED FILL — ?AZITHROMYCIN 250 MG TABLET: 250 | 5 days supply | Qty: 6 | Fill #0

## 2016-12-02 MED FILL — SYMBICORT 80-4.5 MCG INH: 80-4.5 | 20 days supply | Qty: 10 | Fill #0

## 2016-12-02 MED FILL — IPRAT-ALBUT 0.5-3(2.5) MG/3: 0.5-2.5 (3) | 30 days supply | Qty: 360 | Fill #0

## 2016-12-02 NOTE — Patient Instructions (Signed)
Chronic Obstructive Pulmonary Disease Exacerbation Chronic obstructive pulmonary disease (COPD) is a common lung problem. In COPD, the flow of air from the lungs is limited. COPD exacerbations are times that breathing gets worse and you need extra treatment. Without treatment they can be life threatening. If they happen often, your lungs can become more damaged. If your COPD gets worse, your doctor may treat you with:  Medicines.  Oxygen.  Different ways to clear your airway, such as using a mask. Follow these instructions at home:  Do not smoke.  Avoid tobacco smoke and other things that bother your lungs.  If given, take your antibiotic medicine as told. Finish the medicine even if you start to feel better.  Only take medicines as told by your doctor.  Drink enough fluids to keep your pee (urine) clear or pale yellow (unless your doctor has told you not to).  Use a cool mist machine (vaporizer).  If you use oxygen or a machine that turns liquid medicine into a mist (nebulizer), continue to use them as told.  Keep up with shots (vaccinations) as told by your doctor.  Exercise regularly.  Eat healthy foods.  Keep all doctor visits as told. Get help right away if:  You are very short of breath and it gets worse.  You have trouble talking.  You have bad chest pain.  You have blood in your spit (sputum).  You have a fever.  You keep throwing up (vomiting).  You feel weak, or you pass out (faint).  You feel confused.  You keep getting worse. This information is not intended to replace advice given to you by your health care provider. Make sure you discuss any questions you have with your health care provider. Document Released: 11/26/2011 Document Revised: 05/14/2016 Document Reviewed: 08/11/2013 Elsevier Interactive Patient Education  2017 [employer]   -  Steps to Quit Smoking Smoking tobacco can be bad for your health. It can also affect almost every organ  in your body. Smoking puts you and people around you at risk for many serious long-lasting (chronic) diseases. Quitting smoking is hard, but it is one of the best things that you can do for your health. It is never too late to quit. What are the benefits of quitting smoking? When you quit smoking, you lower your risk for getting serious diseases and conditions. They can include:  Lung cancer or lung disease.  Heart disease.  Stroke.  Heart attack.  Not being able to have children (infertility).  Weak bones (osteoporosis) and broken bones (fractures). If you have coughing, wheezing, and shortness of breath, those symptoms may get better when you quit. You may also get sick less often. If you are pregnant, quitting smoking can help to lower your chances of having a baby of low birth weight. What can I do to help me quit smoking? Talk with your doctor about what can help you quit smoking. Some things you can do (strategies) include:  Quitting smoking totally, instead of slowly cutting back how much you smoke over a period of time.  Going to in-person counseling. You are more likely to quit if you go to many counseling sessions.  Using resources and support systems, such as:  Online chats with a Veterinary surgeoncounselor.  Phone quitlines.  Printed Materials engineerself-help materials.  Support groups or group counseling.  Text messaging programs.  Mobile phone apps or applications.  Taking medicines. Some of these medicines may have nicotine in them. If you are pregnant or breastfeeding,  do not take any medicines to quit smoking unless your doctor says it is okay. Talk with your doctor about counseling or other things that can help you. Talk with your doctor about using more than one strategy at the same time, such as taking medicines while you are also going to in-person counseling. This can help make quitting easier. What things can I do to make it easier to quit? Quitting smoking might feel very hard at  first, but there is a lot that you can do to make it easier. Take these steps:  Talk to your family and friends. Ask them to support and encourage you.  Call phone quitlines, reach out to support groups, or work with a Veterinary surgeon.  Ask people who smoke to not smoke around you.  Avoid places that make you want (trigger) to smoke, such as:  Bars.  Parties.  Smoke-break areas at work.  Spend time with people who do not smoke.  Lower the stress in your life. Stress can make you want to smoke. Try these things to help your stress:  Getting regular exercise.  Deep-breathing exercises.  Yoga.  Meditating.  Doing a body scan. To do this, close your eyes, focus on one area of your body at a time from head to toe, and notice which parts of your body are tense. Try to relax the muscles in those areas.  Download or buy apps on your mobile phone or tablet that can help you stick to your quit plan. There are many free apps, such as QuitGuide from the [employer] developer for Disease Control and Prevention). You can find more support from smokefree.gov and other websites. This information is not intended to replace advice given to you by your health care provider. Make sure you discuss any questions you have with your health care provider. Document Released: 10/03/2009 Document Revised: 08/04/2016 Document Reviewed: 04/23/2015 Elsevier Interactive Patient Education  2017 [employer].

## 2016-12-02 NOTE — Progress Notes (Signed)
Carla Hicks, is a 45 y.o. female  AES:[id number]  MYT:[id number]  DOB - 17-Nov-1971  No chief complaint on file.       Subjective:   Carla Hicks is a 45 y.o. female here today trying to pick up her "prednisone" but was unable to due to 1 time rx. She was not feeling well, so I saw her as urgent care visit.  Hx of OOC dm and htn.  Pt has hx of "asthma and COPD", still smoking 1/2 - 1 ppd. Has been having increasing cough/sputum production x 1 wk, w/ tmax 102 yesterday. Denies myalgias. No one else sick at home.  Still smoking, but none recently due to fact could not breath.  She has not been using symbicort, which is prescribed, recd she starts. Has been using albuterol nebs at home w/o relief.  Here w/ her family members.  Patient has No headache, No chest pain, No abdominal pain - No Nausea, No new weakness tingling or numbness.   No problems updated.  ALLERGIES: Allergies  Allergen Reactions  . Penicillins Hives    Has patient had a PCN reaction causing immediate rash, facial/tongue/throat swelling, SOB or lightheadedness with hypotension: yes Has patient had a PCN reaction causing severe rash involving mucus membranes or skin necrosis: no Has patient had a PCN reaction that required hospitalization no Has patient had a PCN reaction occurring within the last 10 years: no If all of the above answers are "NO", then may proceed with Cephalosporin use.   . [place] [Menthol] Hives    PAST MEDICAL HISTORY: Past Medical History:  Diagnosis Date  . Asthma   . Diabetes mellitus without complication ([place])     MEDICATIONS AT HOME: Prior to Admission medications   Medication Sig Start Date End Date Taking? Authorizing Provider  albuterol (PROVENTIL HFA;VENTOLIN HFA) 108 (90 Base) MCG/ACT inhaler Inhale 2 puffs into the lungs every 4 (four) hours as needed for wheezing or shortness of breath. 05/26/16   Boykin Nearing, MD  amitriptyline (ELAVIL) 25 MG  tablet Take by mouth 25 mg nightly for first week, then 50 mg nightly for second week, then 75 mg nightly Patient taking differently: Take 75 mg by mouth at bedtime.  10/01/16   Josalyn Funches, MD  atorvastatin (LIPITOR) 20 MG tablet TAKE 1 TABLET BY MOUTH DAILY AT 6 PM 08/12/16   Josalyn Funches, MD  blood glucose meter kit and supplies KIT Dispense based on patient and insurance preference. Use up to four times daily as directed. (FOR ICD-9 250.00, 250.01). 05/01/16   Cherene Altes, MD  budesonide-formoterol [facility]) 80-4.5 MCG/ACT inhaler Take 2 puffs first thing in am and then another 2 puffs about 12 hours later. Patient not taking: Reported on 08/21/2016 05/26/16   Boykin Nearing, MD  cloNIDine (CATAPRES) 0.1 MG tablet Take 1 tablet (0.1 mg total) by mouth 2 (two) times daily. 04/21/16   Nita Sells, MD  diclofenac sodium (VOLTAREN) 1 % GEL Apply 1 application topically 4 (four) times daily. Patient taking differently: Apply 1 application topically 4 (four) times daily as needed (pain.).  05/30/15   Janne Napoleon, NP  Elastic Bandages & Supports (MEDICAL COMPRESSION STOCKINGS) MISC 25-30 mmHg pressure to be worn daily, knee high stockings 10/01/16   Boykin Nearing, MD  gabapentin (NEURONTIN) 300 MG capsule Take 2 capsules (600 mg total) by mouth 3 (three) times daily. 10/01/16   Josalyn Funches, MD  gemfibrozil (LOPID) 600 MG tablet Take 1 tablet (600 mg total) by  mouth 2 (two) times daily before a meal. Patient not taking: Reported on 11/05/2016 04/21/16   Nita Sells, MD  glucose blood test strip Use as instructed 09/23/16   Boykin Nearing, MD  guaiFENesin (MUCINEX) 600 MG 12 hr tablet Take 1 tablet (600 mg total) by mouth 2 (two) times daily as needed for cough or to loosen phlegm. Patient not taking: Reported on 11/05/2016 10/01/16   Boykin Nearing, MD  guaiFENesin-codeine 100-10 MG/5ML syrup Take 10 mLs by mouth 3 (three) times daily as needed for cough. 11/09/16   Josalyn  Funches, MD  ibuprofen (ADVIL) 200 MG tablet Take 1-2 tablets (200-400 mg total) by mouth every 6 (six) hours as needed for mild pain or moderate pain. Patient not taking: Reported on 11/05/2016 05/01/16   Cherene Altes, MD  insulin aspart (NOVOLOG) 100 UNIT/ML injection Inject 10 U with breakfast and  lunch, 15 U with dinner 10/01/16   Boykin Nearing, MD  insulin glargine (LANTUS) 100 UNIT/ML injection Inject 0.6 mLs (60 Units total) into the skin at bedtime. 10/01/16   Josalyn Funches, MD  INSULIN SYRINGE 1CC/29G 29G X 1/2" 1 ML MISC 1 Syringe by Does not apply route 4 (four) times daily - after meals and at bedtime. 04/21/16   Nita Sells, MD  Lancets MISC 1 Stick by Does not apply route 4 (four) times daily -  with meals and at bedtime. 05/01/16   Cherene Altes, MD  metFORMIN (GLUCOPHAGE) 1000 MG tablet Take 1 tablet (1,000 mg total) by mouth 2 (two) times daily with a meal. 10/01/16   Boykin Nearing, MD  Multiple Vitamin (MULTIVITAMIN WITH MINERALS) TABS tablet Take 1 tablet by mouth daily.    Historical Provider, MD  pantoprazole (PROTONIX) 40 MG tablet Take 1 tablet (40 mg total) by mouth daily. Take 30-60 min before first meal of the day Patient not taking: Reported on 11/05/2016 09/18/15   Brayton Caves, PA-C  triamcinolone ointment (KENALOG) 0.5 % Apply 1 application topically 2 (two) times daily. 08/21/16   Josalyn Funches, MD     Objective:   Vitals:   12/02/16 1255  BP: (!) 149/100  Pulse: (!) 108  Temp: 98.1 F (36.7 C)  TempSrc: Oral  SpO2: 94%    Exam General appearance : Awake, alert, not in any distress. Speech Clear. Not toxic looking, morbid obese,  Speaking in full sentences. HEENT: Atraumatic and Normocephalic, pupils equally reactive to light. Neck: supple, no stridor Chest: prior to nebs/ diminished throughout w/ exp wheezing, tight. No c. Post nebs// much better aeration, less wheezing, less tight. Pt overall feels better as well CVS: S1 S2  regular, no murmurs/gallups or rubs. Abdomen: Bowel sounds active, obese, Non tender and not distended with no gaurding, rigidity or rebound.  Neurology: Awake alert, and oriented X 3, CN II-XII grossly intact, Non focal Skin:No Rash  Data Review Lab Results  Component Value Date   HGBA1C 11.1 08/21/2016   HGBA1C 10.0 (H) 05/10/2016   HGBA1C 9.7 (H) 04/29/2016    Depression screen PHQ 2/9 11/06/2016 10/01/2016 08/21/2016 05/21/2016 05/19/2016  Decreased Interest 2 3 0 0 0  Down, Depressed, Hopeless 2 3 0 2 1  PHQ - 2 Score 4 6 0 2 1  Altered sleeping 3 3 - 3 -  Tired, decreased energy 3 3 - 3 -  Change in appetite 3 3 - 3 -  Feeling bad or failure about yourself  1 - - 0 -  Trouble concentrating 3 1 -  0 -  Moving slowly or fidgety/restless 2 0 - 0 -  Suicidal thoughts 0 0 - 0 -  PHQ-9 Score 19 16 - 11 -      Assessment & Plan   1. Asthma vs copd exacerbation, w/ hx Alpha-1-antitrypsin deficiency reported by pt but ruled out 08/01/15 with MM phenotype - pf now 125 x 2 - duonebs now. - chg albuterol nebs to duonebs, she was not using the pulmicort nebs w/ the albuterol nebs from what I can tell. This may make it easier. - zpack - short course steroids - close f/u in 2 wks - feels much better post duonebs, and lung exam much improved.  - post PF,  300,290,300  2. Tobacco abuse Total cessation advised.     Patient have been counseled extensively about nutrition and exercise  Return in about 2 weeks (around 12/16/2016) for add on for asthma exacerbation/anyone..  The patient was given clear instructions to go to ER or return to medical center if symptoms don't improve, worsen or new problems develop. The patient verbalized understanding. The patient was told to call to get lab results if they haven't heard anything in the next week.   This note has been created with Surveyor, quantity. Any transcriptional errors are unintentional.    Maren Reamer, MD, [facility] [place], [place]   12/02/2016, 1:01 PM

## 2016-12-30 ENCOUNTER — Encounter (HOSPITAL_COMMUNITY): Payer: Self-pay | Admitting: Emergency Medicine

## 2016-12-30 ENCOUNTER — Emergency Department (HOSPITAL_COMMUNITY): Payer: Self-pay

## 2016-12-30 ENCOUNTER — Emergency Department (HOSPITAL_COMMUNITY)
Admission: EM | Admit: 2016-12-30 | Discharge: 2016-12-30 | Disposition: A | Discharge status: Home / Self Care | Payer: Self-pay | Attending: Emergency Medicine | Admitting: Emergency Medicine

## 2016-12-30 DIAGNOSIS — E1165 Type 2 diabetes mellitus with hyperglycemia: Secondary | ICD-10-CM | POA: Insufficient documentation

## 2016-12-30 DIAGNOSIS — J40 Bronchitis, not specified as acute or chronic: Secondary | ICD-10-CM | POA: Insufficient documentation

## 2016-12-30 DIAGNOSIS — R739 Hyperglycemia, unspecified: Secondary | ICD-10-CM

## 2016-12-30 DIAGNOSIS — I5032 Chronic diastolic (congestive) heart failure: Secondary | ICD-10-CM | POA: Insufficient documentation

## 2016-12-30 DIAGNOSIS — I110 Hypertensive heart disease with heart failure: Secondary | ICD-10-CM | POA: Insufficient documentation

## 2016-12-30 DIAGNOSIS — R079 Chest pain, unspecified: Secondary | ICD-10-CM

## 2016-12-30 DIAGNOSIS — F17210 Nicotine dependence, cigarettes, uncomplicated: Secondary | ICD-10-CM | POA: Insufficient documentation

## 2016-12-30 DIAGNOSIS — Z794 Long term (current) use of insulin: Secondary | ICD-10-CM | POA: Insufficient documentation

## 2016-12-30 LAB — BASIC METABOLIC PANEL
ANION GAP: 10 (ref 5–15)
BUN: 8 mg/dL (ref 6–20)
CALCIUM: 9.1 mg/dL (ref 8.9–10.3)
CO2: 22 mmol/L (ref 22–32)
Chloride: 99 mmol/L — ABNORMAL LOW (ref 101–111)
Creatinine, Ser: 0.75 mg/dL (ref 0.44–1.00)
GFR calc Af Amer: >60 mL/min (ref >60)
GLUCOSE: 399 mg/dL — AB (ref 65–99)
POTASSIUM: 4.5 mmol/L (ref 3.5–5.1)
SODIUM: 131 mmol/L — AB (ref 135–145)

## 2016-12-30 LAB — CBG MONITORING, ED: GLUCOSE-CAPILLARY: 503 mg/dL — AB (ref 65–99)

## 2016-12-30 LAB — CBC
HCT: 35.8 % — ABNORMAL LOW (ref 36.0–46.0)
HEMOGLOBIN: 12.6 g/dL (ref 12.0–15.0)
MCH: 30.4 pg (ref 26.0–34.0)
MCHC: 35.2 g/dL (ref 30.0–36.0)
MCV: 86.3 fL (ref 78.0–100.0)
Platelets: 263 10*3/uL (ref 150–400)
RBC: 4.15 MIL/uL (ref 3.87–5.11)
RDW: 13.3 % (ref 11.5–15.5)
WBC: 7.1 10*3/uL (ref 4.0–10.5)

## 2016-12-30 LAB — I-STAT TROPONIN, ED: TROPONIN I, POC: 0 ng/mL (ref 0.00–0.08)

## 2016-12-30 MED ORDER — HYDROCODONE-ACETAMINOPHEN 5-325 MG PO TABS: 1.0000 | ORAL_TABLET | Freq: Four times a day (QID) | ORAL | 0 refills | Status: DC | PRN

## 2016-12-30 MED ORDER — DOXYCYCLINE HYCLATE 100 MG PO CAPS: 100.0000 mg | ORAL_CAPSULE | Freq: Two times a day (BID) | ORAL | 0 refills | Status: DC

## 2016-12-30 NOTE — ED Provider Notes (Signed)
[facility] DEPT Provider Note   CSN: [id number] Arrival date & time: 12/30/16  1221     History   Chief Complaint Chief Complaint  Patient presents with  . Chest Pain    HPI Carla Hicks is a 46 y.o. female.  HPI Carla Hicks is a 46 y.o. female with hx of asthma, emphysema, DM, neuropathy, presents to ED  With complaint of cough, productive sputum, left sided chest pain. Patient reports cough started a week and half ago with green and yellow productive sputum. She reports chest pain started 3 days ago, and is only present when coughing or moving. She denies any shortness of breath. Denies any chest pain at rest or with exertion. Denies any rashes to the chest wall. Reports chronic bilateral leg pain, states diagnosed with neuropathy and takes gabapentin for it. She states she has tried taking guaifenesin with codeine with no relief. Also tried tylenol #3 with no relief of her pain in her chest or legs. Denies swelling to the legs. Denies fever or chills. Denies any sore throat. Reports some nasal congestion.   Past Medical History:  Diagnosis Date  . Asthma   . Diabetes mellitus without complication [facility])     Patient Active Problem List   Diagnosis Date Noted  . Diabetic neuropathy associated with type 2 diabetes mellitus ([place]) 10/01/2016  . Scar of lower leg 08/25/2016  . Calf pain 08/25/2016  . Dysuria 08/25/2016  . Alcohol abuse, in remission 05/09/2016  . Chronic diastolic CHF (congestive heart failure) ([place]) 04/30/2016  . Alcohol-induced chronic pancreatitis ([place])   . Uncontrolled type 2 diabetes mellitus with complication ([place])   . Pulmonary hypertension   . Severe obesity (BMI >= 40) ([place]) 12/28/2015  . Dyslipidemia associated with type 2 diabetes mellitus ([place]) 12/16/2015  . Cough variant asthma vs UACS  07/18/2015  . Essential hypertension, benign 10/24/2014  . Alpha-1-antitrypsin deficiency reported by pt but ruled out 08/01/15 with MM phenotype  10/24/2014    Past Surgical History:  Procedure Laterality Date  . CESAREAN SECTION      OB History    Gravida Para Term Preterm AB Living   _0 SAB TAB Ectopic Multiple Live Births                   Home Medications    Prior to Admission medications   Medication Sig Start Date End Date Taking? Authorizing Provider  albuterol (PROVENTIL HFA;VENTOLIN HFA) 108 (90 Base) MCG/ACT inhaler Inhale 2 puffs into the lungs every 4 (four) hours as needed for wheezing or shortness of breath. 05/26/16   Boykin Nearing, MD  amitriptyline (ELAVIL) 25 MG tablet Take by mouth 25 mg nightly for first week, then 50 mg nightly for second week, then 75 mg nightly Patient taking differently: Take 75 mg by mouth at bedtime.  10/01/16   Josalyn Funches, MD  atorvastatin (LIPITOR) 20 MG tablet TAKE 1 TABLET BY MOUTH DAILY AT 6 PM 08/12/16   Josalyn Funches, MD  azithromycin (ZITHROMAX) 250 MG tablet Day 1 take 2 tabs = 557m, than day 2-5 take 1 tab daily. 12/02/16   DMaren Reamer MD  blood glucose meter kit and supplies KIT Dispense based on patient and insurance preference. Use up to four times daily as directed. (FOR ICD-9 250.00, 250.01). 05/01/16   JCherene Altes MD  budesonide-formoterol ([facility] 80-4.5 MCG/ACT inhaler Take 2 puffs first thing in am and then another  2 puffs about 12 hours later. 12/02/16   Maren Reamer, MD  cloNIDine (CATAPRES) 0.1 MG tablet Take 1 tablet (0.1 mg total) by mouth 2 (two) times daily. 04/21/16   Nita Sells, MD  diclofenac sodium (VOLTAREN) 1 % GEL Apply 1 application topically 4 (four) times daily. Patient taking differently: Apply 1 application topically 4 (four) times daily as needed (pain.).  05/30/15   Janne Napoleon, NP  Elastic Bandages & Supports (MEDICAL COMPRESSION STOCKINGS) MISC 25-30 mmHg pressure to be worn daily, knee high stockings 10/01/16   Boykin Nearing, MD  gabapentin (NEURONTIN) 300 MG capsule Take 2 capsules (600 mg total) by  mouth 3 (three) times daily. 10/01/16   Josalyn Funches, MD  gemfibrozil (LOPID) 600 MG tablet Take 1 tablet (600 mg total) by mouth 2 (two) times daily before a meal. Patient not taking: Reported on 11/05/2016 04/21/16   Nita Sells, MD  glucose blood test strip Use as instructed 09/23/16   Boykin Nearing, MD  guaiFENesin (MUCINEX) 600 MG 12 hr tablet Take 1 tablet (600 mg total) by mouth 2 (two) times daily as needed for cough or to loosen phlegm. Patient not taking: Reported on 11/05/2016 10/01/16   Boykin Nearing, MD  guaiFENesin-codeine 100-10 MG/5ML syrup Take 10 mLs by mouth 3 (three) times daily as needed for cough. 11/09/16   Josalyn Funches, MD  ibuprofen (ADVIL) 200 MG tablet Take 1-2 tablets (200-400 mg total) by mouth every 6 (six) hours as needed for mild pain or moderate pain. Patient not taking: Reported on 11/05/2016 05/01/16   Cherene Altes, MD  insulin aspart (NOVOLOG) 100 UNIT/ML injection Inject 10 U with breakfast and  lunch, 15 U with dinner 10/01/16   Boykin Nearing, MD  insulin glargine (LANTUS) 100 UNIT/ML injection Inject 0.6 mLs (60 Units total) into the skin at bedtime. 10/01/16   Josalyn Funches, MD  INSULIN SYRINGE 1CC/29G 29G X 1/2" 1 ML MISC 1 Syringe by Does not apply route 4 (four) times daily - after meals and at bedtime. 04/21/16   Nita Sells, MD  ipratropium-albuterol (DUONEB) 0.5-2.5 (3) MG/3ML SOLN Take 3 mLs by nebulization every 6 (six) hours as needed (sob/doe). 12/02/16   Maren Reamer, MD  Lancets MISC 1 Stick by Does not apply route 4 (four) times daily -  with meals and at bedtime. 05/01/16   Cherene Altes, MD  metFORMIN (GLUCOPHAGE) 1000 MG tablet Take 1 tablet (1,000 mg total) by mouth 2 (two) times daily with a meal. 10/01/16   Boykin Nearing, MD  Multiple Vitamin (MULTIVITAMIN WITH MINERALS) TABS tablet Take 1 tablet by mouth daily.    Historical Provider, MD  pantoprazole (PROTONIX) 40 MG tablet Take 1 tablet (40 mg total)  by mouth daily. Take 30-60 min before first meal of the day Patient not taking: Reported on 11/05/2016 09/18/15   Brayton Caves, PA-C  predniSONE (DELTASONE) 20 MG tablet Take 3 tablets (60 mg total) by mouth daily with breakfast. Day 1-3, take 20m (=3 tabs) qam, day 4-7 take 427mqam (=2 tabs), day 8-10 take 1 tab daily, than stop 12/02/16   DaMaren ReamerMD  triamcinolone ointment (KENALOG) 0.5 % Apply 1 application topically 2 (two) times daily. 08/21/16   JoBoykin NearingMD    Family History Family History  Problem Relation Age of Onset  . Emphysema Mother     smoked  . Allergies Mother   . Asthma Mother   . Breast cancer Mother   .  Uterine cancer Mother   . Diabetes Father   . Heart disease Maternal Grandmother     Social History Social History  Substance Use Topics  . Smoking status: Current Every Day Smoker    Packs/day: 0.50    Years: 25.00    Types: Cigarettes    Last attempt to quit: 12/22/2011  . Smokeless tobacco: Never Used  . Alcohol use No     Comment: states quit in May 2017     Allergies   Penicillins and Robitussin liquid center [menthol]   Review of Systems Review of Systems  Constitutional: Negative for chills and fever.  HENT: Positive for congestion. Negative for sore throat.   Respiratory: Positive for cough and chest tightness. Negative for shortness of breath.   Cardiovascular: Positive for chest pain. Negative for palpitations and leg swelling.  Gastrointestinal: Negative for abdominal pain, diarrhea, nausea and vomiting.  Genitourinary: Negative for dysuria, flank pain, pelvic pain, vaginal bleeding, vaginal discharge and vaginal pain.  Musculoskeletal: Positive for arthralgias and myalgias. Negative for neck pain and neck stiffness.  Skin: Negative for rash.  Neurological: Negative for dizziness, weakness and headaches.  All other systems reviewed and are negative.    Physical Exam Updated Vital Signs BP 125/82 (BP Location: Left  Arm)   Pulse 101   Temp 98.7 F (37.1 C) (Oral)   Resp 18   Ht _0  (1.626 m)   Wt 103 kg   LMP 11/15/2016   SpO2 99%   BMI 38.96 kg/m   Physical Exam  Constitutional: She appears well-developed and well-nourished. No distress.  HENT:  Head: Normocephalic and atraumatic.  Eyes: Conjunctivae are normal.  Neck: Neck supple.  Cardiovascular: Normal rate, regular rhythm and normal heart sounds.   Pulmonary/Chest: Effort normal. No respiratory distress. She has wheezes. She has no rales. She exhibits tenderness.  Left sided chest tenderness tot he lower ribs under the breast with no rashes.   Abdominal: Soft. Bowel sounds are normal. She exhibits no distension. There is no tenderness. There is no rebound.  Musculoskeletal: She exhibits no edema.  No LE edema. There is a tender calluce to the right foot with no signs of infection. DP pulses intact and equal bilaterally  Neurological: She is alert.  Skin: Skin is warm and dry.  Psychiatric: She has a normal mood and affect. Her behavior is normal.  Nursing note and vitals reviewed.    ED Treatments / Results  Labs (all labs ordered are listed, but only abnormal results are displayed) Labs Reviewed  BASIC METABOLIC PANEL - Abnormal; Notable for the following:       Result Value   Sodium 131 (*)    Chloride 99 (*)    Glucose, Bld 399 (*)    All other components within normal limits  CBC - Abnormal; Notable for the following:    HCT 35.8 (*)    All other components within normal limits  CBG MONITORING, ED - Abnormal; Notable for the following:    Glucose-Capillary 503 (*)    All other components within normal limits  I-STAT TROPOININ, ED    EKG  EKG Interpretation  Date/Time:  Wednesday December 30 2016 12:34:54 EST Ventricular Rate:  105 PR Interval:    QRS Duration: 136 QT Interval:  368 QTC Calculation: 487 R Axis:   -67 Text Interpretation:  Sinus tachycardia RBBB and LAFB No significant change since last tracing  Confirmed by FLOYD MD, DANIEL ([id number]) on 12/30/2016 6:03:07 PM  Radiology Dg Chest 2 View  Result Date: 12/30/2016 CLINICAL DATA:  Left lower chest pain with some shortness of breath and cough over the last 3 days, former smoking history EXAM: CHEST  2 VIEW COMPARISON:  Chest x-ray of 11/05/2016, 05/12/2016, and CT chest of 05/07/2016 FINDINGS: No definite pneumonia or effusion is seen. However it deep at the right lung base medially there is a triangular opacity present. This opacity is not seen on the lateral view. This could represent scarring when compared to prior CT, but attention this area on followup chest x-ray or CT of the chest is recommended. Mediastinal hilar contours are unremarkable. The heart is within normal limits in size. No bony abnormality is seen. IMPRESSION: 1. Vague opacity medially deep in the right lung base may represent scarring but attention to this area on followup chest x-ray or CT the chest is recommended. 2. No definite active infiltrate or effusion. Electronically Signed   By: Ivar Drape M.D.   On: 12/30/2016 13:24    Procedures Procedures (including critical care time)  Medications Ordered in ED Medications - No data to display   Initial Impression / Assessment and Plan / ED Course  I have reviewed the triage vital signs and the nursing notes.  Pertinent labs & imaging results that were available during my care of the patient were reviewed by me and considered in my medical decision making (see chart for details).  Clinical Course     Patient seen and examined. Patient is coming in with cough for a week and a half with productive yellow and green sputum, left-sided chest pain, and pain in her legs. She was already triaged and had blood work and chest x-ray resulted. Her chest x-ray is negative for acute infiltrate or effusion, however there was an area seen on the right lung base, not aware patient's chest pain is, but will need close outpatient  follow-up. This was discussed with the patient. Her labs unremarkable except for her elevated blood sugar of 399. Patient is sitting in the room and eating a foot long [employer] sandwich as she is talking to me. She states she hasn't taken her insulin today yet. I have had a long discussion with her regarding having her blood sugars under good control. Her legs and feet look normal today's exam other than small callus to the right base of her great toe with no signs of infection. I suspect her pain in her legs is most likely from her neuropathy. She did have an infection in the leg in the past. Her chest pain is reproducible with palpation over the left lower ribs and with coughing. It is very atypical and she has a negative troponin that resulted here already. I do not think patient has a PE, she has had prior workup in the past with CT angiogram was negative. She denies any history of PEs. I suspect her pain is most likely from coughing, and with colored sputum, history of COPD and asthma, we will put her on antibiotic, will start doxycycline. She will do her breathing treatments and inhalers at home every 3-4 hours. I will give her 6 tablets of Norco for her pain. We'll have her follow with primary care doctor. She is nontoxic appearing, no acute distress, she is stable for discharge home. Return precautions discussed.   Final Clinical Impressions(s) / ED Diagnoses   Final diagnoses:  Bronchitis  Chest pain, unspecified type  Hyperglycemia    New Prescriptions New Prescriptions   DOXYCYCLINE (  VIBRAMYCIN) 100 MG CAPSULE    Take 1 capsule (100 mg total) by mouth 2 (two) times daily.   HYDROCODONE-ACETAMINOPHEN (NORCO) 5-325 MG TABLET    Take 1 tablet by mouth every 6 (six) hours as needed for moderate pain.     Jeannett Senior, PA-C 12/30/16 [place], DO 12/30/16 1851

## 2016-12-30 NOTE — Discharge Instructions (Signed)
Take inhaler or breathing treatment every 3-4 hrs. Take your regular medications including your insulins. Your blood sugar was very high today. Make sure to watch your diet carefully avoiding excess in carbs/sugars.  Doxycycline for infection as prescribed until all gone. Please follow up with family doctor regarding leg pain and scarring area seen on your chest xray. . Return if worsening symptoms.

## 2016-12-30 NOTE — ED Triage Notes (Signed)
Patient reports sharp left sided chest pain with SOB x3 days. Denies radiation, abdominal pain, N/V/D. Patient also c/o wound to left foot x 10 months. Hx diabetes.

## 2017-01-04 ENCOUNTER — Ambulatory Visit: Payer: Self-pay | Attending: Family Medicine | Admitting: Physician Assistant

## 2017-01-04 ENCOUNTER — Other Ambulatory Visit: Payer: Self-pay | Admitting: Internal Medicine

## 2017-01-04 ENCOUNTER — Encounter: Payer: Self-pay | Admitting: Physician Assistant

## 2017-01-04 ENCOUNTER — Other Ambulatory Visit: Payer: Self-pay | Admitting: Family Medicine

## 2017-01-04 VITALS — BP 113/81 | HR 107 | Temp 98.9°F | Resp 18 | Ht 64.0 in | Wt 215.2 lb

## 2017-01-04 DIAGNOSIS — J209 Acute bronchitis, unspecified: Secondary | ICD-10-CM

## 2017-01-04 DIAGNOSIS — E119 Type 2 diabetes mellitus without complications: Secondary | ICD-10-CM | POA: Insufficient documentation

## 2017-01-04 DIAGNOSIS — N926 Irregular menstruation, unspecified: Secondary | ICD-10-CM

## 2017-01-04 DIAGNOSIS — Z794 Long term (current) use of insulin: Secondary | ICD-10-CM | POA: Insufficient documentation

## 2017-01-04 DIAGNOSIS — Z880 Allergy status to penicillin: Secondary | ICD-10-CM | POA: Insufficient documentation

## 2017-01-04 DIAGNOSIS — J4540 Moderate persistent asthma, uncomplicated: Secondary | ICD-10-CM

## 2017-01-04 DIAGNOSIS — J45909 Unspecified asthma, uncomplicated: Secondary | ICD-10-CM | POA: Insufficient documentation

## 2017-01-04 DIAGNOSIS — E1142 Type 2 diabetes mellitus with diabetic polyneuropathy: Secondary | ICD-10-CM

## 2017-01-04 LAB — POCT URINE PREGNANCY: Preg Test, Ur: NEGATIVE

## 2017-01-04 MED ORDER — GUAIFENESIN-CODEINE 100-10 MG/5ML PO SOLN: 10.0000 mL | Freq: Three times a day (TID) | ORAL | 1 refills | Status: DC | PRN

## 2017-01-04 MED FILL — ATORVASTATIN 20 MG TABLET: 20 | 30 days supply | Qty: 30 | Fill #3

## 2017-01-04 MED FILL — !LANTUS 100 UNITS/ML VIAL: 100 | 33 days supply | Qty: 20 | Fill #1

## 2017-01-04 MED FILL — HYDROCODON-APAP 5-325: 5-325 | 2 days supply | Qty: 6 | Fill #0

## 2017-01-04 MED FILL — TRUE METRIX TEST STRIP: 25 days supply | Qty: 100 | Fill #0

## 2017-01-04 MED FILL — cloNIDine HCL 0.1 MG TABS: 0.1 | 30 days supply | Qty: 60 | Fill #5

## 2017-01-04 MED FILL — metFORMIN HCL 1000 MG TABS: 1000 | 30 days supply | Qty: 60 | Fill #2

## 2017-01-04 MED FILL — DOXYCYCLINE 100 MG TABLET: 100 | 7 days supply | Qty: 14 | Fill #0

## 2017-01-04 MED FILL — GABAPENTIN 300 MG CAPSULE: 300 | 30 days supply | Qty: 180 | Fill #2

## 2017-01-04 NOTE — Progress Notes (Signed)
Patient is here for FU   

## 2017-01-04 NOTE — Progress Notes (Signed)
Carla Hicks  ZOX:[id number]  [id number]  DOB - 10/19/1971  Chief Complaint  Patient presents with  . Bronchitis       Subjective:   Carla Hicks is a 46 y.o. female here today for Emergency Department follow-up. Her last visit was on 12/30/2016. She presented for cough, sputum production, left chest pain or shortness of breath. Her sputum was greenish yellow. Chest x-ray showed no acute process but there was some type of vague opacity medially deep in the right lung base that needs follow-up. Her glucose was 319. She states that her blood sugars chronically run high. Otherwise her labs looked okay. She was diagnosed with bronchitis and given a prescription for doxycycline. She was to continue her inhalers and nebulization treatments. She also was given some when necessary pain medications.  She did not get the doxycycline filled and she is stating that she still doesn't feel well.  ROS: GEN: denies fever or chills, denies change in weight Skin: denies lesions or rashes HEENT: denies headache, earache, epistaxis, sore throat, or neck pain LUNGS: denies SHOB, dyspnea, PND, orthopnea CV: +CP no palpitations ABD: denies abd pain, N or V EXT: denies muscle spasms or swelling; no pain in lower ext, no weakness NEURO: denies numbness or tingling, denies sz, stroke or TIA  ALLERGIES: Allergies  Allergen Reactions  . Penicillins Hives    Has patient had a PCN reaction causing immediate rash, facial/tongue/throat swelling, SOB or lightheadedness with hypotension: yes Has patient had a PCN reaction causing severe rash involving mucus membranes or skin necrosis: no Has patient had a PCN reaction that required hospitalization: yes Has patient had a PCN reaction occurring within the last 10 years: no  If all of the above answers are "NO", then may proceed with Cephalosporin use.   . Robitussin Liquid Center [Menthol] Hives    PAST MEDICAL HISTORY: Past Medical History:    Diagnosis Date  . Asthma   . Diabetes mellitus without complication ([facility])     PAST SURGICAL HISTORY: Past Surgical History:  Procedure Laterality Date  . CESAREAN SECTION      MEDICATIONS AT HOME: Prior to Admission medications   Medication Sig Start Date End Date Taking? Authorizing Provider  albuterol (PROVENTIL HFA;VENTOLIN HFA) 108 (90 Base) MCG/ACT inhaler Inhale 2 puffs into the lungs every 4 (four) hours as needed for wheezing or shortness of breath. 05/26/16  Yes Dessa Phi, MD  amitriptyline (ELAVIL) 25 MG tablet Take by mouth 25 mg nightly for first week, then 50 mg nightly for second week, then 75 mg nightly Patient taking differently: Take 75 mg by mouth at bedtime.  10/01/16  Yes Josalyn Funches, MD  atorvastatin (LIPITOR) 20 MG tablet TAKE 1 TABLET BY MOUTH DAILY AT 6 PM 08/12/16  Yes Josalyn Funches, MD  budesonide-formoterol (SYMBICORT) 80-4.5 MCG/ACT inhaler Take 2 puffs first thing in am and then another 2 puffs about 12 hours later. 12/02/16  Yes Pete Glatter, MD  cloNIDine (CATAPRES) 0.1 MG tablet Take 1 tablet (0.1 mg total) by mouth 2 (two) times daily. 04/21/16  Yes Rhetta Mura, MD  diclofenac sodium (VOLTAREN) 1 % GEL Apply 1 application topically 4 (four) times daily. Patient taking differently: Apply 1 application topically 4 (four) times daily as needed (pain.).  05/30/15  Yes Hayden Rasmussen, NP  esomeprazole (NEXIUM) 20 MG capsule Take 20 mg by mouth daily before breakfast.    Yes Historical Provider, MD  gabapentin (NEURONTIN) 300 MG capsule Take 2 capsules (600 mg total)  by mouth 3 (three) times daily. Patient taking differently: Take 300 mg by mouth 2 (two) times daily.  10/01/16  Yes Josalyn Funches, MD  gemfibrozil (LOPID) 600 MG tablet Take 1 tablet (600 mg total) by mouth 2 (two) times daily before a meal. 04/21/16  Yes Rhetta Mura, MD  guaiFENesin (MUCINEX) 600 MG 12 hr tablet Take 1 tablet (600 mg total) by mouth 2 (two) times daily as  needed for cough or to loosen phlegm. 10/01/16  Yes Josalyn Funches, MD  guaiFENesin-codeine 100-10 MG/5ML syrup Take 10 mLs by mouth 3 (three) times daily as needed for cough. 11/09/16  Yes Josalyn Funches, MD  HYDROcodone-acetaminophen (NORCO) 5-325 MG tablet Take 1 tablet by mouth every 6 (six) hours as needed for moderate pain. 12/30/16  Yes Tatyana Kirichenko, PA-C  insulin aspart (NOVOLOG) 100 UNIT/ML injection Inject 10 U with breakfast and  lunch, 15 U with dinner 10/01/16  Yes Josalyn Funches, MD  insulin aspart (NOVOLOG) 100 UNIT/ML injection Inject 2-15 Units into the skin as needed for high blood sugar. 0-120 2 units,  121-150 3 units, 151-160 4 units, 161- 170 7 units, 171-190 10 units, 191- 200 12 units, 201- 300 15 units   Yes Historical Provider, MD  insulin glargine (LANTUS) 100 UNIT/ML injection Inject 0.6 mLs (60 Units total) into the skin at bedtime. 10/01/16  Yes Josalyn Funches, MD  ipratropium-albuterol (DUONEB) 0.5-2.5 (3) MG/3ML SOLN Take 3 mLs by nebulization every 6 (six) hours as needed (sob/doe). 12/02/16  Yes Pete Glatter, MD  metFORMIN (GLUCOPHAGE) 1000 MG tablet Take 1 tablet (1,000 mg total) by mouth 2 (two) times daily with a meal. 10/01/16  Yes Josalyn Funches, MD  Multiple Vitamin (MULTIVITAMIN WITH MINERALS) TABS tablet Take 1 tablet by mouth daily.   Yes Historical Provider, MD  pantoprazole (PROTONIX) 40 MG tablet Take 1 tablet (40 mg total) by mouth daily. Take 30-60 min before first meal of the day 09/18/15  Yes Ariyan Sinnett Netta Cedars, PA-C  triamcinolone ointment (KENALOG) 0.5 % Apply 1 application topically 2 (two) times daily. 08/21/16  Yes Josalyn Funches, MD  azithromycin (ZITHROMAX) 250 MG tablet Day 1 take 2 tabs = 500mg , than day 2-5 take 1 tab daily. Patient not taking: Reported on 01/04/2017 12/02/16   Pete Glatter, MD  doxycycline (VIBRAMYCIN) 100 MG capsule Take 1 capsule (100 mg total) by mouth 2 (two) times daily. Patient not taking: Reported on  01/04/2017 12/30/16   Tatyana Kirichenko, PA-C  ibuprofen (ADVIL) 200 MG tablet Take 1-2 tablets (200-400 mg total) by mouth every 6 (six) hours as needed for mild pain or moderate pain. Patient not taking: Reported on 01/04/2017 05/01/16   Lonia Blood, MD  predniSONE (DELTASONE) 20 MG tablet Take 3 tablets (60 mg total) by mouth daily with breakfast. Day 1-3, take 60mg  (=3 tabs) qam, day 4-7 take 40mg  qam (=2 tabs), day 8-10 take 1 tab daily, than stop Patient not taking: Reported on 01/04/2017 12/02/16   Pete Glatter, MD     Objective:   There were no vitals filed for this visit.  Exam General appearance : Awake, alert, not in any distress. Speech Clear. Not toxic looking HEENT: Atraumatic and Normocephalic, pupils equally reactive to light and accomodation Neck: supple, no JVD. No cervical lymphadenopathy.  Chest:scant exp wheeze on left  CVS: S1 S2 regular, no murmurs.  Abdomen: Bowel sounds present, Non tender and not distended with no gaurding, rigidity or rebound. Extremities: B/L Lower Ext shows no edema,  both legs are warm to touch Neurology: Awake alert, and oriented X 3, CN II-XII intact, Non focal Skin:No Rash Wounds:N/A  Data Review Lab Results  Component Value Date   HGBA1C 11.1 08/21/2016   HGBA1C 10.0 (H) 05/10/2016   HGBA1C 9.7 (H) 04/29/2016     Assessment & Plan  1. Acute Bronchitis  -pickup Doxycyline today  -cont nebs/inhalers 2. Asthma  -cont maintenance regimen  3. Abn chest xray  -repeat xray or CT chest in future   Return in about 2 weeks (around 01/18/2017).  The patient was given clear instructions to go to ER or return to medical center if symptoms don't improve, worsen or new problems develop. The patient verbalized understanding. The patient was told to call to get lab results if they haven't heard anything in the next week.   This note has been created with Education officer, environmental. Any  transcriptional errors are unintentional.    Scot Jun, PA-C [facility] [place], [place] [phone]   01/04/2017, 4:02 PM

## 2017-01-04 NOTE — Addendum Note (Signed)
Addended byVivianne Master on: 01/04/2017 04:16 PM   Modules accepted: Orders

## 2017-01-04 NOTE — Addendum Note (Signed)
Addended by: Margaretmary Lombard on: 01/04/2017 04:41 PM   Modules accepted: Orders

## 2017-01-05 MED FILL — AMITRIPTYLINE HCL 25 MG TAB: 25 | 30 days supply | Qty: 90 | Fill #0

## 2017-01-18 ENCOUNTER — Ambulatory Visit: Payer: Self-pay | Admitting: Family Medicine

## 2017-02-03 ENCOUNTER — Encounter (HOSPITAL_COMMUNITY): Payer: Self-pay | Admitting: Emergency Medicine

## 2017-02-03 ENCOUNTER — Emergency Department (HOSPITAL_COMMUNITY)
Admission: EM | Admit: 2017-02-03 | Discharge: 2017-02-03 | Disposition: A | Discharge status: Home / Self Care | Payer: Self-pay | Attending: Emergency Medicine | Admitting: Emergency Medicine

## 2017-02-03 ENCOUNTER — Emergency Department (HOSPITAL_COMMUNITY): Payer: Self-pay

## 2017-02-03 DIAGNOSIS — I5032 Chronic diastolic (congestive) heart failure: Secondary | ICD-10-CM | POA: Insufficient documentation

## 2017-02-03 DIAGNOSIS — J441 Chronic obstructive pulmonary disease with (acute) exacerbation: Secondary | ICD-10-CM | POA: Insufficient documentation

## 2017-02-03 DIAGNOSIS — I110 Hypertensive heart disease with heart failure: Secondary | ICD-10-CM | POA: Insufficient documentation

## 2017-02-03 DIAGNOSIS — Z79899 Other long term (current) drug therapy: Secondary | ICD-10-CM | POA: Insufficient documentation

## 2017-02-03 DIAGNOSIS — J4541 Moderate persistent asthma with (acute) exacerbation: Secondary | ICD-10-CM

## 2017-02-03 DIAGNOSIS — E119 Type 2 diabetes mellitus without complications: Secondary | ICD-10-CM | POA: Insufficient documentation

## 2017-02-03 DIAGNOSIS — Z794 Long term (current) use of insulin: Secondary | ICD-10-CM | POA: Insufficient documentation

## 2017-02-03 DIAGNOSIS — F17210 Nicotine dependence, cigarettes, uncomplicated: Secondary | ICD-10-CM | POA: Insufficient documentation

## 2017-02-03 MED ORDER — IPRATROPIUM BROMIDE 0.02 % IN SOLN
0.5000 mg | Freq: Once | RESPIRATORY_TRACT | Status: AC
  Administered 2017-02-03: 0.5 mg via RESPIRATORY_TRACT
  Filled 2017-02-03: qty 2.5

## 2017-02-03 MED ORDER — METHYLPREDNISOLONE SODIUM SUCC 125 MG IJ SOLR
125.0000 mg | Freq: Once | INTRAMUSCULAR | Status: AC
  Administered 2017-02-03: 125 mg via INTRAVENOUS
  Filled 2017-02-03: qty 2

## 2017-02-03 MED ORDER — ALBUTEROL SULFATE (2.5 MG/3ML) 0.083% IN NEBU
5.0000 mg | INHALATION_SOLUTION | Freq: Once | RESPIRATORY_TRACT | Status: AC
  Administered 2017-02-03: 5 mg via RESPIRATORY_TRACT
  Filled 2017-02-03: qty 6

## 2017-02-03 MED ORDER — ALBUTEROL SULFATE HFA 108 (90 BASE) MCG/ACT IN AERS: 2.0000 | INHALATION_SPRAY | RESPIRATORY_TRACT | 1 refills | Status: DC | PRN

## 2017-02-03 MED ORDER — PREDNISONE 20 MG PO TABS: 60.0000 mg | ORAL_TABLET | Freq: Every day | ORAL | 0 refills | Status: DC

## 2017-02-03 NOTE — ED Provider Notes (Signed)
[facility]-EMERGENCY DEPT Provider Note   CSN: [id number] Arrival date & time: 02/03/17  2021     History   Chief Complaint Chief Complaint  Patient presents with  . Shortness of Breath    HPI Carla Hicks is a 46 y.o. female.  Patient with hx asthma presents with increased wheezing, sob, non prod cough, in the past few days. Symptoms moderate-severe. +smoker. States had been on chronic steroids, 60 mg a day, and her pcp abruptly stopped them 2 weeks ago.  Pt denies chest pain. Denies leg pain or swelling. No orthopnea or pnd.    The history is provided by the patient.  Shortness of Breath  Associated symptoms include cough and wheezing. Pertinent negatives include no fever, no headaches, no sore throat, no neck pain, no chest pain, no vomiting, no abdominal pain, no rash and no leg swelling.    Past Medical History:  Diagnosis Date  . Asthma   . Diabetes mellitus without complication [facility])     Patient Active Problem List   Diagnosis Date Noted  . Diabetic neuropathy associated with type 2 diabetes mellitus (HCC) 10/01/2016  . Scar of lower leg 08/25/2016  . Calf pain 08/25/2016  . Dysuria 08/25/2016  . Alcohol abuse, in remission 05/09/2016  . Chronic diastolic CHF (congestive heart failure) (HCC) 04/30/2016  . Alcohol-induced chronic pancreatitis (HCC)   . Uncontrolled type 2 diabetes mellitus with complication (HCC)   . Pulmonary hypertension   . Severe obesity (BMI >= 40) (HCC) 12/28/2015  . Dyslipidemia associated with type 2 diabetes mellitus (HCC) 12/16/2015  . Cough variant asthma vs UACS  07/18/2015  . Essential hypertension, benign 10/24/2014  . Alpha-1-antitrypsin deficiency reported by pt but ruled out 08/01/15 with MM phenotype 10/24/2014    Past Surgical History:  Procedure Laterality Date  . CESAREAN SECTION      OB History    Gravida Para Term Preterm AB Living   5 4 4     4    SAB TAB Ectopic Multiple Live Births                   Home  Medications    Prior to Admission medications   Medication Sig Start Date End Date Taking? Authorizing Provider  albuterol (PROVENTIL HFA;VENTOLIN HFA) 108 (90 Base) MCG/ACT inhaler Inhale 2 puffs into the lungs every 4 (four) hours as needed for wheezing or shortness of breath. 05/26/16   Josalyn Funches, MD  amitriptyline (ELAVIL) 25 MG tablet Take 3 tablets (75 mg total) by mouth at bedtime. 01/05/17   Josalyn Funches, MD  atorvastatin (LIPITOR) 20 MG tablet TAKE 1 TABLET BY MOUTH DAILY AT 6 PM 08/12/16   Josalyn Funches, MD  budesonide-formoterol (SYMBICORT) 80-4.5 MCG/ACT inhaler Take 2 puffs first thing in am and then another 2 puffs about 12 hours later. 12/02/16   Pete Glatter, MD  cloNIDine (CATAPRES) 0.1 MG tablet Take 1 tablet (0.1 mg total) by mouth 2 (two) times daily. 04/21/16   Rhetta Mura, MD  diclofenac sodium (VOLTAREN) 1 % GEL Apply 1 application topically 4 (four) times daily. Patient taking differently: Apply 1 application topically 4 (four) times daily as needed (pain.).  05/30/15   Hayden Rasmussen, NP  doxycycline (VIBRAMYCIN) 100 MG capsule Take 1 capsule (100 mg total) by mouth 2 (two) times daily. Patient not taking: Reported on 01/04/2017 12/30/16   Lemont Fillers Kirichenko, PA-C  esomeprazole (NEXIUM) 20 MG capsule Take 20 mg by mouth daily before breakfast.  Historical Provider, MD  gabapentin (NEURONTIN) 300 MG capsule Take 2 capsules (600 mg total) by mouth 3 (three) times daily. Patient taking differently: Take 300 mg by mouth 2 (two) times daily.  10/01/16   Josalyn Funches, MD  gemfibrozil (LOPID) 600 MG tablet Take 1 tablet (600 mg total) by mouth 2 (two) times daily before a meal. 04/21/16   Rhetta Mura, MD  guaiFENesin (MUCINEX) 600 MG 12 hr tablet Take 1 tablet (600 mg total) by mouth 2 (two) times daily as needed for cough or to loosen phlegm. 10/01/16   Josalyn Funches, MD  guaiFENesin-codeine 100-10 MG/5ML syrup Take 10 mLs by mouth 3 (three) times daily as  needed for cough. 01/04/17   Tiffany Netta Cedars, PA-C  HYDROcodone-acetaminophen (NORCO) 5-325 MG tablet Take 1 tablet by mouth every 6 (six) hours as needed for moderate pain. 12/30/16   Tatyana Kirichenko, PA-C  ibuprofen (ADVIL) 200 MG tablet Take 1-2 tablets (200-400 mg total) by mouth every 6 (six) hours as needed for mild pain or moderate pain. Patient not taking: Reported on 01/04/2017 05/01/16   Lonia Blood, MD  insulin aspart (NOVOLOG) 100 UNIT/ML injection Inject 10 U with breakfast and  lunch, 15 U with dinner 10/01/16   Dessa Phi, MD  insulin aspart (NOVOLOG) 100 UNIT/ML injection Inject 2-15 Units into the skin as needed for high blood sugar. 0-120 2 units,  121-150 3 units, 151-160 4 units, 161- 170 7 units, 171-190 10 units, 191- 200 12 units, 201- 300 15 units    Historical Provider, MD  insulin glargine (LANTUS) 100 UNIT/ML injection Inject 0.6 mLs (60 Units total) into the skin at bedtime. 10/01/16   Josalyn Funches, MD  ipratropium-albuterol (DUONEB) 0.5-2.5 (3) MG/3ML SOLN Take 3 mLs by nebulization every 6 (six) hours as needed (sob/doe). 12/02/16   Pete Glatter, MD  metFORMIN (GLUCOPHAGE) 1000 MG tablet Take 1 tablet (1,000 mg total) by mouth 2 (two) times daily with a meal. 10/01/16   Dessa Phi, MD  Multiple Vitamin (MULTIVITAMIN WITH MINERALS) TABS tablet Take 1 tablet by mouth daily.    Historical Provider, MD  pantoprazole (PROTONIX) 40 MG tablet Take 1 tablet (40 mg total) by mouth daily. Take 30-60 min before first meal of the day 09/18/15   Vivianne Master, PA-C  predniSONE (DELTASONE) 20 MG tablet Take 3 tablets (60 mg total) by mouth daily with breakfast. Day 1-3, take 60mg  (=3 tabs) qam, day 4-7 take 40mg  qam (=2 tabs), day 8-10 take 1 tab daily, than stop Patient not taking: Reported on 01/04/2017 12/02/16   Pete Glatter, MD  triamcinolone ointment (KENALOG) 0.5 % Apply 1 application topically 2 (two) times daily. 08/21/16   Dessa Phi, MD    Family  History Family History  Problem Relation Age of Onset  . Emphysema Mother     smoked  . Allergies Mother   . Asthma Mother   . Breast cancer Mother   . Uterine cancer Mother   . Diabetes Father   . Heart disease Maternal Grandmother     Social History Social History  Substance Use Topics  . Smoking status: Current Every Day Smoker    Packs/day: 0.50    Years: 25.00    Types: Cigarettes    Last attempt to quit: 12/22/2011  . Smokeless tobacco: Never Used  . Alcohol use No     Comment: states quit in May 2017     Allergies   Penicillins and Robitussin liquid center [menthol]  Review of Systems Review of Systems  Constitutional: Negative for chills and fever.  HENT: Negative for sore throat.   Eyes: Negative for redness.  Respiratory: Positive for cough, shortness of breath and wheezing.   Cardiovascular: Negative for chest pain and leg swelling.  Gastrointestinal: Negative for abdominal pain, diarrhea and vomiting.  Genitourinary: Negative for flank pain.  Musculoskeletal: Negative for back pain and neck pain.  Skin: Negative for rash.  Neurological: Negative for headaches.  Hematological: Does not bruise/bleed easily.  Psychiatric/Behavioral: Negative for confusion.     Physical Exam Updated Vital Signs BP 123/88 (BP Location: Right Arm)   Pulse 103   Temp 98.6 F (37 C) (Oral)   Resp 15   Ht 5\' 4"  (1.626 m)   Wt 98.4 kg   SpO2 92%   BMI 37.25 kg/m   Physical Exam  Constitutional: She appears well-developed and well-nourished. No distress.  HENT:  Mouth/Throat: Oropharynx is clear and moist.  Eyes: Conjunctivae are normal. No scleral icterus.  Neck: Neck supple. No tracheal deviation present.  Cardiovascular: Normal rate, regular rhythm, normal heart sounds and intact distal pulses.   Pulmonary/Chest: Effort normal. No respiratory distress. She has wheezes.  Abdominal: Soft. Normal appearance. She exhibits no distension. There is no tenderness.    Musculoskeletal: She exhibits no edema or tenderness.  Neurological: She is alert.  Skin: Skin is warm and dry. No rash noted. She is not diaphoretic.  Psychiatric: She has a normal mood and affect.  Nursing note and vitals reviewed.    ED Treatments / Results  Labs (all labs ordered are listed, but only abnormal results are displayed) Labs Reviewed - No data to display  EKG  EKG Interpretation  Date/Time:  Wednesday February 03 2017 20:31:46 EST Ventricular Rate:  103 PR Interval:    QRS Duration: 138 QT Interval:  386 QTC Calculation: 506 R Axis:   -69 Text Interpretation:  Sinus tachycardia RBBB and LAFB Confirmed by Denton Lank  MD, Caryn Bee ([id number]) on 02/03/2017 8:37:34 PM       Radiology Dg Chest 2 View  Result Date: 02/03/2017 CLINICAL DATA:  Acute onset of shortness of breath and productive cough. Initial encounter. EXAM: CHEST  2 VIEW COMPARISON:  Chest radiograph performed 12/30/2016 FINDINGS: The lungs are well-aerated. Pulmonary vascularity is at the upper limits of normal. There is no evidence of focal opacification, pleural effusion or pneumothorax. The heart is borderline normal in size. No acute osseous abnormalities are seen. IMPRESSION: No acute cardiopulmonary process seen. Electronically Signed   By: Roanna Raider M.D.   On: 02/03/2017 21:54    Procedures Procedures (including critical care time)  Medications Ordered in ED Medications  albuterol (PROVENTIL) (2.5 MG/3ML) 0.083% nebulizer solution 5 mg (not administered)  ipratropium (ATROVENT) nebulizer solution 0.5 mg (not administered)     Initial Impression / Assessment and Plan / ED Course  I have reviewed the triage vital signs and the nursing notes.  Pertinent labs & imaging results that were available during my care of the patient were reviewed by me and considered in my medical decision making (see chart for details).  Iv ns. Monitor. Cxr.  Patient states is feeling much improved.   Albuterol  and atrovent neb. Solumedrol iv.   Recheck, breathing comfortably.  Patient indicates feels breathing at baseline, and requests d/c to home.   V sl wheeze. Good air exchange.   Pt continues to indicate feels ready for d/c.     Final Clinical Impressions(s) / ED Diagnoses  Final diagnoses:  None    New Prescriptions New Prescriptions   No medications on file     Cathren Laine, MD 02/03/17 2200

## 2017-02-03 NOTE — Discharge Instructions (Signed)
It was our pleasure to provide your ER care today - we hope that you feel better.  Use albuterol inhaler or neb treatment every 3-4 hours as need.  Take prednisone as prescribed.  Avoid any smoking, or smoky environments.   Follow up with your primary care doctor in the next 1-2 days for recheck.  Return to ER right away if worse, trouble breathing, chest pain, other concern.

## 2017-02-03 NOTE — ED Notes (Signed)
Pt O2 87% RA, placed on 4L [place], sats 94%

## 2017-02-03 NOTE — ED Triage Notes (Signed)
BIB EMS from home, SOB past 3-4 days with productive cough. Has been take prednisone along with albuterol inhaler at home with no relief. Denies CP, fever/chills. Lung sounds initially absent with EMS, given 10 albuterol and 1 atrovent and pt now has wheezing in all lobes. VSS. Ambulatory.

## 2017-02-04 MED FILL — VENTOLIN HFA 90 MCG INHALER: 108 (90 BAS | 16 days supply | Qty: 18 | Fill #0

## 2017-02-04 MED FILL — predniSONE 20 MG TABS: 20 | 5 days supply | Qty: 15 | Fill #0

## 2017-02-09 ENCOUNTER — Emergency Department (HOSPITAL_COMMUNITY)
Admission: EM | Admit: 2017-02-09 | Discharge: 2017-02-09 | Disposition: A | Discharge status: Home / Self Care | Payer: Self-pay | Attending: Emergency Medicine | Admitting: Emergency Medicine

## 2017-02-09 ENCOUNTER — Ambulatory Visit: Payer: Self-pay | Admitting: Family Medicine

## 2017-02-09 ENCOUNTER — Encounter (HOSPITAL_COMMUNITY): Payer: Self-pay | Admitting: Emergency Medicine

## 2017-02-09 DIAGNOSIS — F17210 Nicotine dependence, cigarettes, uncomplicated: Secondary | ICD-10-CM | POA: Insufficient documentation

## 2017-02-09 DIAGNOSIS — E1140 Type 2 diabetes mellitus with diabetic neuropathy, unspecified: Secondary | ICD-10-CM | POA: Insufficient documentation

## 2017-02-09 DIAGNOSIS — I110 Hypertensive heart disease with heart failure: Secondary | ICD-10-CM | POA: Insufficient documentation

## 2017-02-09 DIAGNOSIS — L729 Follicular cyst of the skin and subcutaneous tissue, unspecified: Secondary | ICD-10-CM

## 2017-02-09 DIAGNOSIS — M79661 Pain in right lower leg: Secondary | ICD-10-CM

## 2017-02-09 DIAGNOSIS — I5032 Chronic diastolic (congestive) heart failure: Secondary | ICD-10-CM | POA: Insufficient documentation

## 2017-02-09 DIAGNOSIS — L728 Other follicular cysts of the skin and subcutaneous tissue: Secondary | ICD-10-CM | POA: Insufficient documentation

## 2017-02-09 DIAGNOSIS — Z794 Long term (current) use of insulin: Secondary | ICD-10-CM | POA: Insufficient documentation

## 2017-02-09 DIAGNOSIS — J45909 Unspecified asthma, uncomplicated: Secondary | ICD-10-CM | POA: Insufficient documentation

## 2017-02-09 DIAGNOSIS — G629 Polyneuropathy, unspecified: Secondary | ICD-10-CM

## 2017-02-09 MED ORDER — BUPIVACAINE HCL (PF) 0.5 % IJ SOLN
10.0000 mL | Freq: Once | INTRAMUSCULAR | Status: AC
  Administered 2017-02-09: 10 mL
  Filled 2017-02-09: qty 30

## 2017-02-09 NOTE — ED Notes (Signed)
ED Provider at bedside. 

## 2017-02-09 NOTE — ED Notes (Signed)
Bupivacaine at bedside with syringes and needles

## 2017-02-09 NOTE — ED Notes (Signed)
EDP made aware that medication is ready for use.

## 2017-02-09 NOTE — ED Triage Notes (Signed)
Patient c/o bilat leg pain x several days. Patient c/o knot like that have developed last night in right calf area. Patient states that she doesn't have feeling in feet but pain in from knees down to feet.

## 2017-02-09 NOTE — ED Provider Notes (Signed)
[facility]-EMERGENCY DEPT Provider Note   CSN: [id number] Arrival date & time: 02/09/17  1215     History   Chief Complaint Chief Complaint  Patient presents with  . Leg Pain  . knots in right calf    HPI Carla Hicks is a 46 y.o. female.  The history is provided by the patient.  Extremity Pain  This is a chronic problem. The current episode started more than 1 week ago. The problem occurs constantly. The problem has not changed since onset.Associated symptoms comments: Small area of induration at right medial calf. Nothing aggravates the symptoms. Nothing relieves the symptoms. She has tried nothing for the symptoms.    Past Medical History:  Diagnosis Date  . Asthma   . Diabetes mellitus without complication [facility])     Patient Active Problem List   Diagnosis Date Noted  . Diabetic neuropathy associated with type 2 diabetes mellitus (HCC) 10/01/2016  . Scar of lower leg 08/25/2016  . Calf pain 08/25/2016  . Dysuria 08/25/2016  . Alcohol abuse, in remission 05/09/2016  . Chronic diastolic CHF (congestive heart failure) (HCC) 04/30/2016  . Alcohol-induced chronic pancreatitis (HCC)   . Uncontrolled type 2 diabetes mellitus with complication (HCC)   . Pulmonary hypertension   . Severe obesity (BMI >= 40) (HCC) 12/28/2015  . Dyslipidemia associated with type 2 diabetes mellitus (HCC) 12/16/2015  . Cough variant asthma vs UACS  07/18/2015  . Essential hypertension, benign 10/24/2014  . Alpha-1-antitrypsin deficiency reported by pt but ruled out 08/01/15 with MM phenotype 10/24/2014    Past Surgical History:  Procedure Laterality Date  . CESAREAN SECTION      OB History    Gravida Para Term Preterm AB Living   5 4 4     4    SAB TAB Ectopic Multiple Live Births                   Home Medications    Prior to Admission medications   Medication Sig Start Date End Date Taking? Authorizing Provider  albuterol (PROVENTIL HFA;VENTOLIN HFA) 108 (90 Base) MCG/ACT  inhaler Inhale 2 puffs into the lungs every 4 (four) hours as needed for wheezing or shortness of breath. 05/26/16  Yes Josalyn Funches, MD  albuterol (PROVENTIL HFA;VENTOLIN HFA) 108 (90 Base) MCG/ACT inhaler Inhale 2 puffs into the lungs every 4 (four) hours as needed for wheezing or shortness of breath. 02/03/17  Yes Cathren Laine, MD  amitriptyline (ELAVIL) 25 MG tablet Take 3 tablets (75 mg total) by mouth at bedtime. 01/05/17  Yes Josalyn Funches, MD  atorvastatin (LIPITOR) 20 MG tablet TAKE 1 TABLET BY MOUTH DAILY AT 6 PM 08/12/16  Yes Josalyn Funches, MD  budesonide-formoterol (SYMBICORT) 80-4.5 MCG/ACT inhaler Take 2 puffs first thing in am and then another 2 puffs about 12 hours later. 12/02/16  Yes Pete Glatter, MD  cloNIDine (CATAPRES) 0.1 MG tablet Take 1 tablet (0.1 mg total) by mouth 2 (two) times daily. 04/21/16  Yes Rhetta Mura, MD  diclofenac sodium (VOLTAREN) 1 % GEL Apply 1 application topically 4 (four) times daily. Patient taking differently: Apply 1 application topically 4 (four) times daily as needed (pain.).  05/30/15   Hayden Rasmussen, NP  esomeprazole (NEXIUM) 20 MG capsule Take 20 mg by mouth daily before breakfast.     Historical Provider, MD  gabapentin (NEURONTIN) 300 MG capsule Take 2 capsules (600 mg total) by mouth 3 (three) times daily. Patient taking differently: Take 300 mg by mouth 2 (  two) times daily.  10/01/16   Josalyn Funches, MD  gemfibrozil (LOPID) 600 MG tablet Take 1 tablet (600 mg total) by mouth 2 (two) times daily before a meal. 04/21/16   Rhetta Mura, MD  guaiFENesin (MUCINEX) 600 MG 12 hr tablet Take 1 tablet (600 mg total) by mouth 2 (two) times daily as needed for cough or to loosen phlegm. 10/01/16   Josalyn Funches, MD  guaiFENesin-codeine 100-10 MG/5ML syrup Take 10 mLs by mouth 3 (three) times daily as needed for cough. 01/04/17   Tiffany Netta Cedars, PA-C  HYDROcodone-acetaminophen (NORCO) 5-325 MG tablet Take 1 tablet by mouth every 6 (six) hours  as needed for moderate pain. 12/30/16   Tatyana Kirichenko, PA-C  ibuprofen (ADVIL) 200 MG tablet Take 1-2 tablets (200-400 mg total) by mouth every 6 (six) hours as needed for mild pain or moderate pain. 05/01/16   Lonia Blood, MD  insulin aspart (NOVOLOG) 100 UNIT/ML injection Inject 10 U with breakfast and  lunch, 15 U with dinner 10/01/16   Josalyn Funches, MD  insulin aspart (NOVOLOG) 100 UNIT/ML injection Inject 2-15 Units into the skin as needed for high blood sugar. 0-120 2 units,  121-150 3 units, 151-160 4 units, 161- 170 7 units, 171-190 10 units, 191- 200 12 units, 201- 300 15 units    Historical Provider, MD  insulin glargine (LANTUS) 100 UNIT/ML injection Inject 0.6 mLs (60 Units total) into the skin at bedtime. 10/01/16   Josalyn Funches, MD  ipratropium-albuterol (DUONEB) 0.5-2.5 (3) MG/3ML SOLN Take 3 mLs by nebulization every 6 (six) hours as needed (sob/doe). 12/02/16   Pete Glatter, MD  metFORMIN (GLUCOPHAGE) 1000 MG tablet Take 1 tablet (1,000 mg total) by mouth 2 (two) times daily with a meal. 10/01/16   Dessa Phi, MD  Multiple Vitamin (MULTIVITAMIN WITH MINERALS) TABS tablet Take 1 tablet by mouth daily.    Historical Provider, MD  pantoprazole (PROTONIX) 40 MG tablet Take 1 tablet (40 mg total) by mouth daily. Take 30-60 min before first meal of the day 09/18/15   Vivianne Master, PA-C  predniSONE (DELTASONE) 20 MG tablet Take 3 tablets (60 mg total) by mouth daily. 02/03/17   Cathren Laine, MD  triamcinolone ointment (KENALOG) 0.5 % Apply 1 application topically 2 (two) times daily. 08/21/16   Dessa Phi, MD    Family History Family History  Problem Relation Age of Onset  . Emphysema Mother     smoked  . Allergies Mother   . Asthma Mother   . Breast cancer Mother   . Uterine cancer Mother   . Diabetes Father   . Heart disease Maternal Grandmother     Social History Social History  Substance Use Topics  . Smoking status: Current Every Day Smoker     Packs/day: 0.50    Years: 25.00    Types: Cigarettes    Last attempt to quit: 12/22/2011  . Smokeless tobacco: Never Used  . Alcohol use No     Comment: states quit in May 2017     Allergies   Penicillins and Robitussin liquid center [menthol]   Review of Systems Review of Systems  All other systems reviewed and are negative.    Physical Exam Updated Vital Signs BP (!) 138/101 (BP Location: Left Arm)   Pulse 106   Temp 97.4 F (36.3 C) (Oral)   Resp 16   SpO2 98%   Physical Exam  Constitutional: She is oriented to person, place, and time. She appears  well-developed and well-nourished. No distress.  HENT:  Head: Normocephalic.  Nose: Nose normal.  Eyes: Conjunctivae are normal.  Neck: Neck supple. No tracheal deviation present.  Cardiovascular: Normal rate and regular rhythm.   Pulmonary/Chest: Effort normal. No respiratory distress.  Abdominal: Soft. She exhibits no distension.  Musculoskeletal:  Small indurated area 2 cm over right medial calf c/w subcutaneous cyst. No leg swelling or erythema or warmth  Neurological: She is alert and oriented to person, place, and time.  Skin: Skin is warm and dry.  Psychiatric: She has a normal mood and affect.     ED Treatments / Results  Labs (all labs ordered are listed, but only abnormal results are displayed) Labs Reviewed - No data to display  EKG  EKG Interpretation None       Radiology No results found.  Procedures Procedures (including critical care time)  Procedure Note: Trigger Point Injection for Myofascial pain  Performed by Dr. Clydene Pugh Indication: muscle/myofascial pain Muscle body and tendon sheath of the right calf muscle(s) were injected with 0.5% bupivacaine under sterile technique for release of muscle spasm/pain. Patient tolerated well with immediate improvement of symptoms and no immediate complications following procedure.  CPT Code:   1 or 2 muscle bodies: 20552  Medications Ordered in  ED Medications  bupivacaine (MARCAINE) 0.5 % injection 10 mL (not administered)     Initial Impression / Assessment and Plan / ED Course  I have reviewed the triage vital signs and the nursing notes.  Pertinent labs & imaging results that were available during my care of the patient were reviewed by me and considered in my medical decision making (see chart for details).     46 y.o. female presents with pain of right calf and small superficial cyst. States pain goes down her whole leg. No signs of DVT. Offered local anaesthesia. Suspect neuropathy and difficulty with chronic pain control. Plan to follow up with PCP as needed and return precautions discussed for worsening or new concerning symptoms.   Final Clinical Impressions(s) / ED Diagnoses   Final diagnoses:  Benign cyst of skin  Neuropathy (HCC)  Right calf pain    New Prescriptions Discharge Medication List as of 02/09/2017  5:59 PM       Lyndal Pulley, MD 02/11/17 [id number]

## 2017-02-10 ENCOUNTER — Inpatient Hospital Stay (HOSPITAL_COMMUNITY)
Admission: EM | Admit: 2017-02-10 | Discharge: 2017-02-12 | DRG: 439 | Disposition: A | Discharge status: Home / Self Care | Payer: Medicaid Other | Attending: Internal Medicine | Admitting: Internal Medicine

## 2017-02-10 ENCOUNTER — Encounter (HOSPITAL_COMMUNITY): Payer: Self-pay

## 2017-02-10 ENCOUNTER — Emergency Department (HOSPITAL_COMMUNITY): Payer: Medicaid Other

## 2017-02-10 ENCOUNTER — Other Ambulatory Visit: Payer: Self-pay | Admitting: Family Medicine

## 2017-02-10 ENCOUNTER — Other Ambulatory Visit: Payer: Self-pay

## 2017-02-10 ENCOUNTER — Encounter: Payer: Self-pay | Admitting: Physician Assistant

## 2017-02-10 ENCOUNTER — Ambulatory Visit: Payer: Self-pay | Attending: Physician Assistant | Admitting: Physician Assistant

## 2017-02-10 VITALS — BP 172/131 | HR 99 | Temp 98.0°F | Resp 16

## 2017-02-10 DIAGNOSIS — M79661 Pain in right lower leg: Secondary | ICD-10-CM | POA: Insufficient documentation

## 2017-02-10 DIAGNOSIS — I272 Pulmonary hypertension, unspecified: Secondary | ICD-10-CM | POA: Diagnosis present

## 2017-02-10 DIAGNOSIS — E785 Hyperlipidemia, unspecified: Secondary | ICD-10-CM | POA: Diagnosis present

## 2017-02-10 DIAGNOSIS — I10 Essential (primary) hypertension: Secondary | ICD-10-CM

## 2017-02-10 DIAGNOSIS — F17201 Nicotine dependence, unspecified, in remission: Secondary | ICD-10-CM | POA: Diagnosis present

## 2017-02-10 DIAGNOSIS — J441 Chronic obstructive pulmonary disease with (acute) exacerbation: Secondary | ICD-10-CM | POA: Diagnosis present

## 2017-02-10 DIAGNOSIS — R1013 Epigastric pain: Secondary | ICD-10-CM | POA: Insufficient documentation

## 2017-02-10 DIAGNOSIS — T380X5A Adverse effect of glucocorticoids and synthetic analogues, initial encounter: Secondary | ICD-10-CM | POA: Diagnosis present

## 2017-02-10 DIAGNOSIS — Z66 Do not resuscitate: Secondary | ICD-10-CM | POA: Diagnosis present

## 2017-02-10 DIAGNOSIS — K860 Alcohol-induced chronic pancreatitis: Secondary | ICD-10-CM | POA: Diagnosis present

## 2017-02-10 DIAGNOSIS — Z880 Allergy status to penicillin: Secondary | ICD-10-CM

## 2017-02-10 DIAGNOSIS — Z825 Family history of asthma and other chronic lower respiratory diseases: Secondary | ICD-10-CM

## 2017-02-10 DIAGNOSIS — Z7289 Other problems related to lifestyle: Secondary | ICD-10-CM

## 2017-02-10 DIAGNOSIS — I5032 Chronic diastolic (congestive) heart failure: Secondary | ICD-10-CM | POA: Diagnosis present

## 2017-02-10 DIAGNOSIS — F1010 Alcohol abuse, uncomplicated: Secondary | ICD-10-CM | POA: Diagnosis present

## 2017-02-10 DIAGNOSIS — E1149 Type 2 diabetes mellitus with other diabetic neurological complication: Secondary | ICD-10-CM

## 2017-02-10 DIAGNOSIS — J449 Chronic obstructive pulmonary disease, unspecified: Secondary | ICD-10-CM | POA: Diagnosis present

## 2017-02-10 DIAGNOSIS — Z720 Tobacco use: Secondary | ICD-10-CM

## 2017-02-10 DIAGNOSIS — E1110 Type 2 diabetes mellitus with ketoacidosis without coma: Secondary | ICD-10-CM | POA: Diagnosis present

## 2017-02-10 DIAGNOSIS — Z833 Family history of diabetes mellitus: Secondary | ICD-10-CM | POA: Diagnosis not present

## 2017-02-10 DIAGNOSIS — I110 Hypertensive heart disease with heart failure: Secondary | ICD-10-CM | POA: Diagnosis present

## 2017-02-10 DIAGNOSIS — E1165 Type 2 diabetes mellitus with hyperglycemia: Secondary | ICD-10-CM | POA: Diagnosis present

## 2017-02-10 DIAGNOSIS — N179 Acute kidney failure, unspecified: Secondary | ICD-10-CM | POA: Diagnosis present

## 2017-02-10 DIAGNOSIS — E1140 Type 2 diabetes mellitus with diabetic neuropathy, unspecified: Secondary | ICD-10-CM | POA: Insufficient documentation

## 2017-02-10 DIAGNOSIS — E1142 Type 2 diabetes mellitus with diabetic polyneuropathy: Secondary | ICD-10-CM

## 2017-02-10 DIAGNOSIS — Z888 Allergy status to other drugs, medicaments and biological substances status: Secondary | ICD-10-CM

## 2017-02-10 DIAGNOSIS — R112 Nausea with vomiting, unspecified: Secondary | ICD-10-CM

## 2017-02-10 DIAGNOSIS — K859 Acute pancreatitis without necrosis or infection, unspecified: Secondary | ICD-10-CM

## 2017-02-10 DIAGNOSIS — Z803 Family history of malignant neoplasm of breast: Secondary | ICD-10-CM

## 2017-02-10 DIAGNOSIS — K852 Alcohol induced acute pancreatitis without necrosis or infection: Principal | ICD-10-CM | POA: Diagnosis present

## 2017-02-10 DIAGNOSIS — F17210 Nicotine dependence, cigarettes, uncomplicated: Secondary | ICD-10-CM | POA: Diagnosis present

## 2017-02-10 DIAGNOSIS — Z8049 Family history of malignant neoplasm of other genital organs: Secondary | ICD-10-CM | POA: Diagnosis not present

## 2017-02-10 DIAGNOSIS — IMO0002 Reserved for concepts with insufficient information to code with codable children: Secondary | ICD-10-CM | POA: Diagnosis present

## 2017-02-10 DIAGNOSIS — Z8249 Family history of ischemic heart disease and other diseases of the circulatory system: Secondary | ICD-10-CM | POA: Diagnosis not present

## 2017-02-10 DIAGNOSIS — R110 Nausea: Secondary | ICD-10-CM | POA: Insufficient documentation

## 2017-02-10 DIAGNOSIS — Z86718 Personal history of other venous thrombosis and embolism: Secondary | ICD-10-CM | POA: Diagnosis not present

## 2017-02-10 DIAGNOSIS — J45901 Unspecified asthma with (acute) exacerbation: Secondary | ICD-10-CM

## 2017-02-10 DIAGNOSIS — E1169 Type 2 diabetes mellitus with other specified complication: Secondary | ICD-10-CM

## 2017-02-10 DIAGNOSIS — Z794 Long term (current) use of insulin: Secondary | ICD-10-CM

## 2017-02-10 DIAGNOSIS — E118 Type 2 diabetes mellitus with unspecified complications: Secondary | ICD-10-CM

## 2017-02-10 DIAGNOSIS — Z9119 Patient's noncompliance with other medical treatment and regimen: Secondary | ICD-10-CM | POA: Diagnosis not present

## 2017-02-10 DIAGNOSIS — Z789 Other specified health status: Secondary | ICD-10-CM

## 2017-02-10 HISTORY — DX: Emphysema, unspecified: J43.9

## 2017-02-10 HISTORY — DX: Chronic obstructive pulmonary disease, unspecified: J44.9

## 2017-02-10 LAB — CBC
HCT: 41.2 % (ref 36.0–46.0)
Hemoglobin: 14.3 g/dL (ref 12.0–15.0)
MCH: 30.7 pg (ref 26.0–34.0)
MCHC: 34.7 g/dL (ref 30.0–36.0)
MCV: 88.4 fL (ref 78.0–100.0)
PLATELETS: 382 10*3/uL (ref 150–400)
RBC: 4.66 MIL/uL (ref 3.87–5.11)
RDW: 12.6 % (ref 11.5–15.5)
WBC: 6.3 10*3/uL (ref 4.0–10.5)

## 2017-02-10 LAB — COMPREHENSIVE METABOLIC PANEL
ALK PHOS: 76 U/L (ref 38–126)
ALT: 25 U/L (ref 14–54)
ANION GAP: 12 (ref 5–15)
AST: 46 U/L — AB (ref 15–41)
Albumin: 3.4 g/dL — ABNORMAL LOW (ref 3.5–5.0)
BILIRUBIN TOTAL: 0.5 mg/dL (ref 0.3–1.2)
BUN: 14 mg/dL (ref 6–20)
CALCIUM: 9.4 mg/dL (ref 8.9–10.3)
CO2: 25 mmol/L (ref 22–32)
Chloride: 96 mmol/L — ABNORMAL LOW (ref 101–111)
Creatinine, Ser: 1.11 mg/dL — ABNORMAL HIGH (ref 0.44–1.00)
GFR calc Af Amer: >60 mL/min (ref >60)
GFR, EST NON AFRICAN AMERICAN: 59 mL/min — AB (ref >60)
Glucose, Bld: 243 mg/dL — ABNORMAL HIGH (ref 65–99)
POTASSIUM: 3.9 mmol/L (ref 3.5–5.1)
Sodium: 133 mmol/L — ABNORMAL LOW (ref 135–145)
TOTAL PROTEIN: 7.4 g/dL (ref 6.5–8.1)

## 2017-02-10 LAB — POCT GLYCOSYLATED HEMOGLOBIN (HGB A1C): Hemoglobin A1C: 8.1

## 2017-02-10 LAB — CBG MONITORING, ED: GLUCOSE-CAPILLARY: 137 mg/dL — AB (ref 65–99)

## 2017-02-10 LAB — LIPASE, BLOOD: Lipase: 191 U/L — ABNORMAL HIGH (ref 11–51)

## 2017-02-10 LAB — GLUCOSE, POCT (MANUAL RESULT ENTRY): POC Glucose: 430 mg/dl — AB (ref 70–99)

## 2017-02-10 LAB — POCT CBG (FASTING - GLUCOSE)-MANUAL ENTRY: GLUCOSE FASTING, POC: 434 mg/dL — AB (ref 70–99)

## 2017-02-10 LAB — HCG, QUANTITATIVE, PREGNANCY: HCG, BETA CHAIN, QUANT, S: 3 m[IU]/mL (ref <5)

## 2017-02-10 MED ORDER — ALBUTEROL SULFATE (2.5 MG/3ML) 0.083% IN NEBU
5.0000 mg | INHALATION_SOLUTION | Freq: Once | RESPIRATORY_TRACT | Status: AC
Start: 2017-02-10 — End: 2017-02-10
  Administered 2017-02-10: 5 mg via RESPIRATORY_TRACT
  Filled 2017-02-10: qty 6

## 2017-02-10 MED ORDER — INSULIN ASPART 100 UNIT/ML ~~LOC~~ SOLN: 10.0000 [IU] | Freq: Once | SUBCUTANEOUS | Status: DC

## 2017-02-10 MED ORDER — CLONIDINE HCL 0.1 MG PO TABS
0.1000 mg | ORAL_TABLET | Freq: Once | ORAL | Status: AC
  Administered 2017-02-10: 0.1 mg via ORAL

## 2017-02-10 MED ORDER — SODIUM CHLORIDE 0.9 % IV BOLUS (SEPSIS)
1000.0000 mL | Freq: Once | INTRAVENOUS | Status: AC
  Administered 2017-02-10: 1000 mL via INTRAVENOUS

## 2017-02-10 MED ORDER — MORPHINE SULFATE (PF) 4 MG/ML IV SOLN
8.0000 mg | Freq: Once | INTRAVENOUS | Status: AC
  Administered 2017-02-10: 8 mg via INTRAVENOUS
  Filled 2017-02-10: qty 2

## 2017-02-10 MED ORDER — KETOROLAC TROMETHAMINE 30 MG/ML IJ SOLN
30.0000 mg | Freq: Once | INTRAMUSCULAR | Status: AC
  Administered 2017-02-10: 30 mg via INTRAMUSCULAR

## 2017-02-10 MED ORDER — AMITRIPTYLINE HCL 25 MG PO TABS: 75.0000 mg | ORAL_TABLET | Freq: Every day | ORAL | 0 refills | Status: DC

## 2017-02-10 MED ORDER — ONDANSETRON HCL 4 MG/2ML IJ SOLN
4.0000 mg | Freq: Once | INTRAMUSCULAR | Status: AC
  Administered 2017-02-10: 4 mg via INTRAVENOUS
  Filled 2017-02-10: qty 2

## 2017-02-10 MED ORDER — MORPHINE SULFATE (PF) 4 MG/ML IV SOLN
4.0000 mg | Freq: Once | INTRAVENOUS | Status: AC
  Administered 2017-02-10: 4 mg via INTRAVENOUS
  Filled 2017-02-10: qty 1

## 2017-02-10 MED ORDER — INSULIN ASPART 100 UNIT/ML ~~LOC~~ SOLN
10.0000 [IU] | Freq: Once | SUBCUTANEOUS | Status: AC
  Administered 2017-02-10: 10 [IU] via SUBCUTANEOUS

## 2017-02-10 MED ORDER — IPRATROPIUM BROMIDE 0.02 % IN SOLN
0.5000 mg | Freq: Once | RESPIRATORY_TRACT | Status: AC
  Administered 2017-02-10: 0.5 mg via RESPIRATORY_TRACT
  Filled 2017-02-10: qty 2.5

## 2017-02-10 MED ORDER — INSULIN ASPART 100 UNIT/ML ~~LOC~~ SOLN: 20.0000 [IU] | Freq: Once | SUBCUTANEOUS | Status: DC

## 2017-02-10 MED FILL — metFORMIN HCL 1000 MG TABS: 1000 | 30 days supply | Qty: 60 | Fill #3

## 2017-02-10 MED FILL — !LANTUS 100 UNITS/ML VIAL: 100 | 33 days supply | Qty: 20 | Fill #2

## 2017-02-10 MED FILL — !NOVOLOG 100UNITS/ML VIAL: 100/ML | 28 days supply | Qty: 10 | Fill #1

## 2017-02-10 MED FILL — cloNIDine HCL 0.1 MG TABS: 0.1 | 30 days supply | Qty: 60 | Fill #6

## 2017-02-10 MED FILL — ATORVASTATIN 20 MG TABLET: 20 | 30 days supply | Qty: 30 | Fill #4

## 2017-02-10 MED FILL — GUAIFENESIN AC COUGH SYRUP: 100-10 | 30 days supply | Qty: 900 | Fill #0

## 2017-02-10 MED FILL — AMITRIPTYLINE HCL 25 MG TAB: 25 | 30 days supply | Qty: 90 | Fill #0

## 2017-02-10 NOTE — ED Provider Notes (Signed)
[facility]-EMERGENCY DEPT Provider Note   CSN: [id number] Arrival date & time: 02/10/17  1737     History   Chief Complaint Chief Complaint  Patient presents with  . Abdominal Pain    HPI Carla Hicks is a 46 y.o. female with a PMHx of pancreatitis, DM2, COPD/asthma, alcohol abuse, and HLD, with a PSHx of C/S x4 and R oophorectomy, who presents to the ED with complaints of Epigastric abdominal pain that began around 2:30 this morning. Chart review reveals she was seen at [facility] today and was sent here to r/o pancreatitis; she was given 20U novolog and 30mg  Toradol prior to being sent here. Patient states that this feels very similar to her prior pancreatitis episodes. She describes the pain as 10/10 constant sharp and achy epigastric abdominal pain radiating to her mid back, worse with movement, and unrelieved with Tylenol, ibuprofen, Mucinex, and Toradol. Associated symptoms include nausea, 6 episodes of nonbloody nonbilious emesis, and poor appetite. She states that she's had a poor appetite for several months. She has not eaten anything today or yesterday. She also reports that over the last 1 week she's had a cough with green sputum production and wheezing. She was seen on 02/03/17 in the ED, had a negative chest x-ray and was given DuoNeb and Solu-Medrol, sent home with prednisone. She admits to regular NSAID use. She also admits that she consumed a 40 ounce beer yesterday. Additionally she reports that she continues to smoke cigarettes. She denies recent travel, sick contacts, or suspicious food intake.   She also denies fevers, chills, ear pain/drainage, rhinorrhea, sore throat, CP, SOB, diarrhea, constipation, obstipation, melena, hematochezia, hematemesis, hematuria, dysuria, vaginal bleeding/discharge, myalgias, arthralgias, numbness, tingling, focal weakness, or any other complaints at this time.    The history is provided by the patient and medical records. No language interpreter was  used.  Abdominal Pain   This is a recurrent problem. The current episode started 12 to 24 hours ago. The problem occurs constantly. The problem has been gradually worsening. The pain is associated with alcohol use. The pain is located in the epigastric region. The quality of the pain is sharp and aching. The pain is at a severity of 10/10. The pain is severe. Associated symptoms include anorexia, nausea and vomiting. Pertinent negatives include fever, diarrhea, flatus, hematochezia, melena, constipation, dysuria, hematuria, arthralgias and myalgias. The symptoms are aggravated by activity. Nothing relieves the symptoms. Past medical history comments: pancreatitis.    Past Medical History:  Diagnosis Date  . Asthma   . COPD (chronic obstructive pulmonary disease) (HCC)   . Diabetes mellitus without complication (HCC)   . Emphysema lung [facility])     Patient Active Problem List   Diagnosis Date Noted  . Diabetic neuropathy associated with type 2 diabetes mellitus (HCC) 10/01/2016  . Scar of lower leg 08/25/2016  . Calf pain 08/25/2016  . Dysuria 08/25/2016  . Alcohol abuse, in remission 05/09/2016  . Chronic diastolic CHF (congestive heart failure) (HCC) 04/30/2016  . Alcohol-induced chronic pancreatitis (HCC)   . Uncontrolled type 2 diabetes mellitus with complication (HCC)   . Pulmonary hypertension   . Severe obesity (BMI >= 40) (HCC) 12/28/2015  . Dyslipidemia associated with type 2 diabetes mellitus (HCC) 12/16/2015  . Cough variant asthma vs UACS  07/18/2015  . Essential hypertension, benign 10/24/2014  . Alpha-1-antitrypsin deficiency reported by pt but ruled out 08/01/15 with MM phenotype 10/24/2014    Past Surgical History:  Procedure Laterality Date  . CESAREAN SECTION  OB History    Gravida Para Term Preterm AB Living   5 4 4     4    SAB TAB Ectopic Multiple Live Births                   Home Medications    Prior to Admission medications   Medication Sig Start  Date End Date Taking? Authorizing Provider  albuterol (PROVENTIL HFA;VENTOLIN HFA) 108 (90 Base) MCG/ACT inhaler Inhale 2 puffs into the lungs every 4 (four) hours as needed for wheezing or shortness of breath. 05/26/16   Josalyn Funches, MD  albuterol (PROVENTIL HFA;VENTOLIN HFA) 108 (90 Base) MCG/ACT inhaler Inhale 2 puffs into the lungs every 4 (four) hours as needed for wheezing or shortness of breath. 02/03/17   Cathren Laine, MD  amitriptyline (ELAVIL) 25 MG tablet Take 3 tablets (75 mg total) by mouth at bedtime. 02/10/17   Josalyn Funches, MD  atorvastatin (LIPITOR) 20 MG tablet TAKE 1 TABLET BY MOUTH DAILY AT 6 PM 08/12/16   Josalyn Funches, MD  budesonide-formoterol (SYMBICORT) 80-4.5 MCG/ACT inhaler Take 2 puffs first thing in am and then another 2 puffs about 12 hours later. 12/02/16   Pete Glatter, MD  cloNIDine (CATAPRES) 0.1 MG tablet Take 1 tablet (0.1 mg total) by mouth 2 (two) times daily. 04/21/16   Rhetta Mura, MD  diclofenac sodium (VOLTAREN) 1 % GEL Apply 1 application topically 4 (four) times daily. Patient taking differently: Apply 1 application topically 4 (four) times daily as needed (pain.).  05/30/15   Hayden Rasmussen, NP  esomeprazole (NEXIUM) 20 MG capsule Take 20 mg by mouth daily before breakfast.     Historical Provider, MD  gabapentin (NEURONTIN) 300 MG capsule Take 2 capsules (600 mg total) by mouth 3 (three) times daily. Patient taking differently: Take 300 mg by mouth 2 (two) times daily.  10/01/16   Josalyn Funches, MD  gemfibrozil (LOPID) 600 MG tablet Take 1 tablet (600 mg total) by mouth 2 (two) times daily before a meal. 04/21/16   Rhetta Mura, MD  guaiFENesin (MUCINEX) 600 MG 12 hr tablet Take 1 tablet (600 mg total) by mouth 2 (two) times daily as needed for cough or to loosen phlegm. 10/01/16   Josalyn Funches, MD  guaiFENesin-codeine 100-10 MG/5ML syrup Take 10 mLs by mouth 3 (three) times daily as needed for cough. 01/04/17   Tiffany Netta Cedars, PA-C    HYDROcodone-acetaminophen (NORCO) 5-325 MG tablet Take 1 tablet by mouth every 6 (six) hours as needed for moderate pain. 12/30/16   Tatyana Kirichenko, PA-C  ibuprofen (ADVIL) 200 MG tablet Take 1-2 tablets (200-400 mg total) by mouth every 6 (six) hours as needed for mild pain or moderate pain. 05/01/16   Lonia Blood, MD  insulin aspart (NOVOLOG) 100 UNIT/ML injection Inject 10 U with breakfast and  lunch, 15 U with dinner 10/01/16   Josalyn Funches, MD  insulin aspart (NOVOLOG) 100 UNIT/ML injection Inject 2-15 Units into the skin as needed for high blood sugar. 0-120 2 units,  121-150 3 units, 151-160 4 units, 161- 170 7 units, 171-190 10 units, 191- 200 12 units, 201- 300 15 units    Historical Provider, MD  insulin glargine (LANTUS) 100 UNIT/ML injection Inject 0.6 mLs (60 Units total) into the skin at bedtime. 10/01/16   Josalyn Funches, MD  ipratropium-albuterol (DUONEB) 0.5-2.5 (3) MG/3ML SOLN Take 3 mLs by nebulization every 6 (six) hours as needed (sob/doe). 12/02/16   Pete Glatter, MD  metFORMIN (  GLUCOPHAGE) 1000 MG tablet Take 1 tablet (1,000 mg total) by mouth 2 (two) times daily with a meal. 10/01/16   Dessa Phi, MD  Multiple Vitamin (MULTIVITAMIN WITH MINERALS) TABS tablet Take 1 tablet by mouth daily.    Historical Provider, MD  pantoprazole (PROTONIX) 40 MG tablet Take 1 tablet (40 mg total) by mouth daily. Take 30-60 min before first meal of the day 09/18/15   Vivianne Master, PA-C  predniSONE (DELTASONE) 20 MG tablet Take 3 tablets (60 mg total) by mouth daily. 02/03/17   Cathren Laine, MD  triamcinolone ointment (KENALOG) 0.5 % Apply 1 application topically 2 (two) times daily. 08/21/16   Dessa Phi, MD    Family History Family History  Problem Relation Age of Onset  . Emphysema Mother     smoked  . Allergies Mother   . Asthma Mother   . Breast cancer Mother   . Uterine cancer Mother   . Diabetes Father   . Heart disease Maternal Grandmother     Social  History Social History  Substance Use Topics  . Smoking status: Current Every Day Smoker    Packs/day: 0.50    Years: 25.00    Types: Cigarettes  . Smokeless tobacco: Never Used  . Alcohol use 0.0 oz/week     Comment: a 40oz or a 12 pack of beer per day 02/10/17     Allergies   Penicillins and Robitussin liquid center [menthol]   Review of Systems Review of Systems  Constitutional: Positive for appetite change. Negative for chills and fever.  HENT: Negative for ear discharge, ear pain, rhinorrhea and sore throat.   Respiratory: Positive for cough and wheezing. Negative for shortness of breath.   Cardiovascular: Negative for chest pain.  Gastrointestinal: Positive for abdominal pain, anorexia, nausea and vomiting. Negative for blood in stool, constipation, diarrhea, flatus, hematochezia and melena.  Genitourinary: Negative for dysuria, hematuria, vaginal bleeding and vaginal discharge.  Musculoskeletal: Negative for arthralgias and myalgias.  Skin: Negative for color change.  Allergic/Immunologic: Positive for immunocompromised state (DM2).  Neurological: Negative for weakness and numbness.  Psychiatric/Behavioral: Negative for confusion.   10 Systems reviewed and are negative for acute change except as noted in the HPI.   Physical Exam Updated Vital Signs BP 125/89   Pulse 88   Temp 97.8 F (36.6 C) (Oral)   Resp (!) 27   Ht 5\' 4"  (1.626 m)   Wt 98.4 kg   LMP 12/10/2016 (Approximate)   SpO2 100%   BMI 37.25 kg/m   Physical Exam  Constitutional: She is oriented to person, place, and time. Vital signs are normal. She appears well-developed and well-nourished.  Non-toxic appearance. No distress.  Afebrile, nontoxic, NAD  HENT:  Head: Normocephalic and atraumatic.  Mouth/Throat: Oropharynx is clear and moist and mucous membranes are normal.  Eyes: Conjunctivae and EOM are normal. Right eye exhibits no discharge. Left eye exhibits no discharge.  Neck: Normal range of  motion. Neck supple.  Cardiovascular: Normal rate, regular rhythm, normal heart sounds and intact distal pulses.  Exam reveals no gallop and no friction rub.   No murmur heard. Pulmonary/Chest: Effort normal. No respiratory distress. She has no decreased breath sounds. She has wheezes. She has rhonchi. She has no rales.  Scattered rhonchi and wheezing throughout all lung fields, no rales, no hypoxia or increased WOB, speaking in full sentences, SpO2 100% on RA   Abdominal: Soft. Normal appearance and bowel sounds are normal. She exhibits no distension. There is  tenderness in the right upper quadrant, epigastric area and left upper quadrant. There is positive Murphy's sign. There is no rigidity, no rebound, no guarding, no CVA tenderness and no tenderness at McBurney's point.  Soft, obese but nondistended, +BS throughout, with moderate RUQ, epigastric, and LUQ TTP, no r/g/r, +murphy's, neg mcburney's, no CVA TTP   Musculoskeletal: Normal range of motion.  Neurological: She is alert and oriented to person, place, and time. She has normal strength. No sensory deficit.  Skin: Skin is warm, dry and intact. No rash noted.  Psychiatric: She has a normal mood and affect.  Nursing note and vitals reviewed.    ED Treatments / Results  Labs (all labs ordered are listed, but only abnormal results are displayed) Labs Reviewed  LIPASE, BLOOD - Abnormal; Notable for the following:       Result Value   Lipase 191 (*)    All other components within normal limits  COMPREHENSIVE METABOLIC PANEL - Abnormal; Notable for the following:    Sodium 133 (*)    Chloride 96 (*)    Glucose, Bld 243 (*)    Creatinine, Ser 1.11 (*)    Albumin 3.4 (*)    AST 46 (*)    GFR calc non Af Amer 59 (*)    All other components within normal limits  CBG MONITORING, ED - Abnormal; Notable for the following:    Glucose-Capillary 137 (*)    All other components within normal limits  CBC  HCG, QUANTITATIVE, PREGNANCY    URINALYSIS, ROUTINE W REFLEX MICROSCOPIC    EKG  EKG Interpretation  Date/Time:  Wednesday February 10 2017 17:42:29 EST Ventricular Rate:  93 PR Interval:  140 QRS Duration: 132 QT Interval:  398 QTC Calculation: 494 R Axis:   -73 Text Interpretation:  Normal sinus rhythm Right bundle branch block Left anterior fascicular block  Bifascicular block  Minimal voltage criteria for LVH, may be normal variant Abnormal ECG No significant change since last tracing Confirmed by FLOYD MD, DANIEL [phone]) on 02/10/2017 8:43:29 PM       Radiology Dg Chest 2 View  Result Date: 02/10/2017 CLINICAL DATA:  Epigastric pain EXAM: CHEST  2 VIEW COMPARISON:  02/03/2017 FINDINGS: The heart size and mediastinal contours are within normal limits. Both lungs are clear. The visualized skeletal structures are unremarkable. IMPRESSION: No active cardiopulmonary disease. Electronically Signed   By: Signa Kell M.D.   On: 02/10/2017 21:23   US Abdomen Complete  Result Date: 02/10/2017 CLINICAL DATA:  46 y/o  F; epigastric pain.  Elevated lipase. EXAM: ABDOMEN ULTRASOUND COMPLETE COMPARISON:  04/29/2016 abdominal ultrasound. FINDINGS: Gallbladder: No gallstones or wall thickening visualized. No sonographic Murphy sign noted by sonographer. Common bile duct: Diameter: 4.3 mm Liver: No focal lesion identified. Within normal limits in parenchymal echogenicity. IVC: No abnormality visualized. Pancreas: The pancreas is thickened with heterogeneous echotexture probably representing underlying pancreatitis. Spleen: Size and appearance within normal limits. Right Kidney: Length: 11.0 cm. Simple 1.3 cm cyst. No hydronephrosis. Left Kidney: Length: 11.8 cm. Echogenicity within normal limits. No mass or hydronephrosis visualized. Abdominal aorta: No aneurysm visualized. Bifurcation not visualized. Other findings: Abdominal pain with compression over tail of pancreas. IMPRESSION: Heterogeneous and thickened pancreas with  abdominal pain with ultrasound compression over tail of pancreas, likely acute pancreatitis. Otherwise unremarkable abdominal ultrasound. Electronically Signed   By: Mitzi Hansen M.D.   On: 02/10/2017 22:01    Procedures Procedures (including critical care time)  Medications Ordered in ED Medications  sodium chloride 0.9 % bolus 1,000 mL (1,000 mLs Intravenous New Bag/Given 02/10/17 2206)  albuterol (PROVENTIL) (2.5 MG/3ML) 0.083% nebulizer solution 5 mg (5 mg Nebulization Given 02/10/17 2206)  ipratropium (ATROVENT) nebulizer solution 0.5 mg (0.5 mg Nebulization Given 02/10/17 2206)  ondansetron (ZOFRAN) injection 4 mg (4 mg Intravenous Given 02/10/17 2207)  morphine 4 MG/ML injection 4 mg (4 mg Intravenous Given 02/10/17 2207)  morphine 4 MG/ML injection 8 mg (8 mg Intravenous Given 02/10/17 2244)     Initial Impression / Assessment and Plan / ED Course  I have reviewed the triage vital signs and the nursing notes.  Pertinent labs & imaging results that were available during my care of the patient were reviewed by me and considered in my medical decision making (see chart for details).     46 y.o. female here with multiple issues, primarily epigastric abd pain/n/v onset this morning, admits to drinking beer yesterday; also with cough/wheezing x1wk. Recently seen in ED for COPD exacerbation 1wk ago (discharged with prednisone, CXR that visit negative). On exam, RUQ/epigastric/LUQ TTP, +murphy's, nonperitoneal abdomen; lung sounds rhonchorous and wheezing throughout. Labs reveal: Lipase 191. CMP with gluc 243 however no anion gap or abnormal bicarb, and CBG at 7:30pm showed glucose 137; Cr 1.11 which is mildly elevated from prior, AST 46 just marginally high, remainder of CMP WNL. CBC WNL. BetaHCG neg. EKG unchanged from prior without acute ischemic findings. Likely pancreatitis due to recent EtOH consumption however due to +murphy's on exam will obtain Abd U/S to evaluate this further;  also likely COPD exacerbation, will get CXR and give duoneb but hold off on prednisone/steroids since she just got this recently and she's a diabetic so want to avoid this if possible. Awaiting U/A. Will give morphine and zofran, fluids, and reassess shortly  10:37 PM U/S with acute pancreatitis but no gallbladder pathology seen. CXR negative. Pt feeling slightly better with regards to nausea, however pain still ongoing; will give more morphine. Lung sounds greatly improved after duoneb. Smoking cessation encouraged, alcohol cessation advised. Will proceed with admission for acute pancreatitis, and COPD exacerbation. Advised that we still need her U/A. Will monitor and reassess shortly  10:59 PM Dr. Adela Glimpse of [facility] returning page and will admit. Holding orders to be placed by admitting team. Please see their notes for further documentation of care. I appreciate their help with this pleasant pt's care. Pt stable at time of admission.    Final Clinical Impressions(s) / ED Diagnoses   Final diagnoses:  Epigastric abdominal pain  Nausea and vomiting in adult patient  Alcohol-induced acute pancreatitis, unspecified complication status  Tobacco user  Alcohol use  COPD exacerbation (HCC)  AKI (acute kidney injury) [facility])    New Prescriptions New Prescriptions   No medications on file     [address] Jay Kempe, PA-C 02/10/17 2259    Melene Plan, DO 02/10/17 2303

## 2017-02-10 NOTE — ED Notes (Signed)
Pt back in room. No distress observed. 

## 2017-02-10 NOTE — ED Triage Notes (Signed)
Pt endorses epigastric pain that began today with 3 episodes of vomiting. Pt seen at [facility] yesterday for leg pain and had local anesthesia completed. Pt seen to the health and wellness today and sent here for pancreatitis rule out.

## 2017-02-10 NOTE — Patient Instructions (Addendum)
I strongly advise that you go to the emergency department. You will need a laboratory workup to include a check for pancreatitis. You may have abdominal imaging done also. Please do not delay in going to the emergency department.     You have received twenty units of Novolog and 30mg  of Toradol here in clinic today 02/10/2017.    Acute Pancreatitis Acute pancreatitis is a condition in which the pancreas suddenly becomes irritated and swollen (has inflammation). The pancreas is a gland that is located behind the stomach. It produces enzymes that help to digest food. The pancreas also releases the hormones glucagon and insulin, which help to regulate blood sugar. Damage to the pancreas occurs when the digestive enzymes from the pancreas are activated before they are released into the intestine. Most acute attacks last a couple of days and can cause serious problems. Some people become dehydrated and develop low blood pressure. In severe cases, bleeding into the pancreas can lead to shock and can be life-threatening. The lungs, heart, and kidneys may fail. What are the causes? The most common causes of this condition are:  Alcohol abuse.  Gallstones. Other causes include:  Certain medicines.  Exposure to certain chemicals.  Infection.  Damage caused by an accident (trauma).  Abdominal surgery. In some cases, the cause may not be known. What are the signs or symptoms? Symptoms of this condition include:  Pain in the upper abdomen that may radiate to the back.  Tenderness and swelling of the abdomen.  Nausea and vomiting. How is this diagnosed? This condition may be diagnosed based on:  A physical exam.  Blood tests.  Imaging tests, such as X-rays, CT scans, or an ultrasound of the abdomen. How is this treated? Treatment for this condition usually requires a stay in the hospital. Treatment may include:  Pain medicine.  Fluid replacement through an IV tube.  Placing a tube  in the stomach to remove stomach contents and to control vomiting (NG tube, or nasogastric tube).  Not eating for 3-4 days. This gives the pancreas a rest, because enzymes are not being produced that can cause further damage.  Antibiotic medicines, if your condition is caused by an infection.  Surgery on the pancreas or gallbladder. Follow these instructions at home: Eating and drinking  Follow instructions from your health care provider about diet. This may involve avoiding alcohol and decreasing the amount of fat in your diet.  Eat smaller, more frequent meals. This reduces the amount of digestive fluids that the pancreas produces.  Drink enough fluid to keep your urine clear or pale yellow.  Do not drink alcohol if it caused your condition. General instructions  Take over-the-counter and prescription medicines only as told by your health care provider.  Do not use any tobacco products, such as cigarettes, chewing tobacco, and e-cigarettes. If you need help quitting, ask your health care provider.  Get plenty of rest.  If directed, check your blood sugar at home as told by your health care provider.  Keep all follow-up visits as told by your health care provider. This is important. Contact a health care provider if:  You do not recover as quickly as expected.  You develop new or worsening symptoms.  You have persistent pain, weakness, or nausea.  You recover and then have another episode of pain.  You have a fever. Get help right away if:  You cannot eat or keep fluids down.  Your pain becomes severe.  Your skin or the white  part of your eyes turns yellow (jaundice).  You vomit.  You feel dizzy or you faint.  Your blood sugar is high (over 300 mg/dL). This information is not intended to replace advice given to you by your health care provider. Make sure you discuss any questions you have with your health care provider. Document Released: 12/07/2005 Document  Revised: 04/15/2016 Document Reviewed: 09/10/2015 Elsevier Interactive Patient Education  2017 [employer].

## 2017-02-10 NOTE — ED Notes (Signed)
Patient transported to Ultrasound 

## 2017-02-10 NOTE — Progress Notes (Signed)
Pt is in the office for medication refill Pt states she was seen in the ED for COPD and she is needing her predisone refilled Pt states her pain level is a 10 Pt states her pain is coming from her right leg and abdomen

## 2017-02-10 NOTE — H&P (Signed)
Carla Hicks ZOX:[id number] DOB: [birth year] DOA: 02/10/2017     PCP: Lora Paula, MD   Outpatient Specialists: [facility] Patient coming from: home Lives alone,        Chief Complaint: cough, abdominal pain  HPI: Carla Hicks is a 46 y.o. female with medical history significant of asthma/COPD, tobacco abuse, alcohol abuse, chronic pancreatitis chronic diastolic CHF, uncontrolled type 2 diabetes with neuropathy, pulmonary hypertension, HTN    Presented with multiple complaints including nausea vomiting abdominal epigastric pain after drinking 40 ounces by mouth yesterday. Patient is reminiscent of her pancreatitis.  Persistent cough and wheezing patient has been seen for the same on February 14 in emergency department he was given nebulizer treatment and Solu-Medrol IV with great improvement discharged home in prednisone. patient continues to smoke. She denies any chest pain no fevers. Today again patient was seen in the emergency department for leg swelling and knots in her legs she was told they cysts and had them drain.  Today patient presented to the health and wellness requesting medication reveals an particularly prednisone reporting right leg pain and abdominal pain she have had a total of 6 episodes of non- bloody vomiting.  Of note patient has been on chronic steroids which her PCP has stopped   He reports that she has been out of her metformin and clonidine but has been taking her insulin and inhalers she states that she has chronic COPD and requires to be on 60 mg of steroids chronically and every time her dose decreases she develops worsening symptoms she used to go to Dr. Sherene Sires in the past  IN ER:  Temp (24hrs), Avg:97.9 F (36.6 C), Min:97.8 F (36.6 C), Max:98 F (36.7 C) RR 15 7000% HR 100 BP 118/92 Please 191 which is up from baseline Sodium 133 potassium 3.9 glucose 243 creatinine 1.11 which is up from baseline albumin 3.4 WBC 6.3 hemoglobin  14.3 [place]: was worrisome for acute pancreatitis chest x-ray unremarkable After administration of nebulizer shortness of breath and coughing rapidly improved  Following Medications were ordered in ER: Medications  sodium chloride 0.9 % bolus 1,000 mL (1,000 mLs Intravenous New Bag/Given 02/10/17 2206)  albuterol (PROVENTIL) (2.5 MG/3ML) 0.083% nebulizer solution 5 mg (5 mg Nebulization Given 02/10/17 2206)  ipratropium (ATROVENT) nebulizer solution 0.5 mg (0.5 mg Nebulization Given 02/10/17 2206)  ondansetron (ZOFRAN) injection 4 mg (4 mg Intravenous Given 02/10/17 2207)  morphine 4 MG/ML injection 4 mg (4 mg Intravenous Given 02/10/17 2207)  morphine 4 MG/ML injection 8 mg (8 mg Intravenous Given 02/10/17 2244)      Hospitalist was called for admission for Pancreatitis  Review of Systems:    Pertinent positives include:  excess mucus, productive cough,  Constitutional:  No weight loss, night sweats, Fevers, chills, fatigue, weight loss  HEENT:  No headaches, Difficulty swallowing,Tooth/dental problems,Sore throat,  No sneezing, itching, ear ache, nasal congestion, post nasal drip,  Cardio-vascular:  No chest pain, Orthopnea, PND, anasarca, dizziness, palpitations.no Bilateral lower extremity swelling  GI:  No heartburn, indigestion, abdominal pain, nausea, vomiting, diarrhea, change in bowel habits, loss of appetite, melena, blood in stool, hematemesis Resp:  no shortness of breath at rest. No dyspnea on exertion, No  no  No non-productive cough, No coughing up of blood.No change in color of mucus.No wheezing. Skin:  no rash or lesions. No jaundice GU:  no dysuria, change in color of urine, no urgency or frequency. No straining to urinate.  No flank pain.  Musculoskeletal:  No joint pain or no joint swelling. No decreased range of motion. No back pain.  Psych:  No change in mood or affect. No depression or anxiety. No memory loss.  Neuro: no localizing neurological complaints, no  tingling, no weakness, no double vision, no gait abnormality, no slurred speech, no confusion  As per HPI otherwise 10 point review of systems negative.   Past Medical History: Past Medical History:  Diagnosis Date  . Asthma   . COPD (chronic obstructive pulmonary disease) (HCC)   . Diabetes mellitus without complication (HCC)   . Emphysema lung (HCC)    Past Surgical History:  Procedure Laterality Date  . CESAREAN SECTION       Social History:  Ambulatory   independently    reports that she has been smoking Cigarettes.  She has a 12.50 pack-year smoking history. She has never used smokeless tobacco. She reports that she drinks alcohol. She reports that she does not use drugs.  Allergies:   Allergies  Allergen Reactions  . Penicillins Hives    Has patient had a PCN reaction causing immediate rash, facial/tongue/throat swelling, SOB or lightheadedness with hypotension: Yes Has patient had a PCN reaction causing severe rash involving mucus membranes or skin necrosis: No Has patient had a PCN reaction that required hospitalization: Yes Has patient had a PCN reaction occurring within the last 10 years: No If all of the above answers are "NO", then may proceed with Cephalosporin use.   . Robitussin Liquid Center [Menthol] Hives       Family History:   Family History  Problem Relation Age of Onset  . Emphysema Mother     smoked  . Allergies Mother   . Asthma Mother   . Breast cancer Mother   . Uterine cancer Mother   . Diabetes Father   . Heart disease Maternal Grandmother     Medications: Prior to Admission medications   Medication Sig Start Date End Date Taking? Authorizing Provider  acetaminophen-codeine (TYLENOL #4) 300-60 MG tablet Take 1 tablet by mouth every 6 (six) hours as needed for pain.   Yes Historical Provider, MD  albuterol (PROVENTIL HFA;VENTOLIN HFA) 108 (90 Base) MCG/ACT inhaler Inhale 2 puffs into the lungs every 4 (four) hours as needed for  wheezing or shortness of breath. 02/03/17  Yes Cathren Laine, MD  amitriptyline (ELAVIL) 25 MG tablet Take 3 tablets (75 mg total) by mouth at bedtime. 02/10/17  Yes Josalyn Funches, MD  atorvastatin (LIPITOR) 20 MG tablet TAKE 1 TABLET BY MOUTH DAILY AT 6 PM 08/12/16  Yes Josalyn Funches, MD  budesonide-formoterol (SYMBICORT) 80-4.5 MCG/ACT inhaler Take 2 puffs first thing in am and then another 2 puffs about 12 hours later. 12/02/16  Yes Pete Glatter, MD  cloNIDine (CATAPRES) 0.1 MG tablet Take 1 tablet (0.1 mg total) by mouth 2 (two) times daily. 04/21/16  Yes Rhetta Mura, MD  diclofenac sodium (VOLTAREN) 1 % GEL Apply 1 application topically 4 (four) times daily. Patient taking differently: Apply 1 application topically 4 (four) times daily as needed (pain.).  05/30/15  Yes Hayden Rasmussen, NP  esomeprazole (NEXIUM 24HR) 20 MG capsule Take 20 mg by mouth daily.   Yes Historical Provider, MD  gabapentin (NEURONTIN) 300 MG capsule Take 2 capsules (600 mg total) by mouth 3 (three) times daily. Patient taking differently: Take 900 mg by mouth 3 (three) times daily.  10/01/16  Yes Josalyn Funches, MD  gemfibrozil (LOPID) 600 MG tablet Take 1 tablet (600  mg total) by mouth 2 (two) times daily before a meal. 04/21/16  Yes Rhetta Mura, MD  guaiFENesin-codeine 100-10 MG/5ML syrup Take 10 mLs by mouth 3 (three) times daily as needed for cough. 01/04/17  Yes Tiffany Netta Cedars, PA-C  ibuprofen (ADVIL) 200 MG tablet Take 1-2 tablets (200-400 mg total) by mouth every 6 (six) hours as needed for mild pain or moderate pain. Patient taking differently: Take 800 mg by mouth every 6 (six) hours as needed for mild pain or moderate pain.  05/01/16  Yes Lonia Blood, MD  insulin aspart (NOVOLOG) 100 UNIT/ML injection Inject 10 U with breakfast and  lunch, 15 U with dinner Patient taking differently: Inject 10-15 Units into the skin 3 (three) times daily with meals. 10 units with breakfast then 10 units with lunch  then 15 units with dinner (evening meal) 10/01/16  Yes Josalyn Funches, MD  insulin glargine (LANTUS) 100 UNIT/ML injection Inject 0.6 mLs (60 Units total) into the skin at bedtime. 10/01/16  Yes Josalyn Funches, MD  ipratropium-albuterol (DUONEB) 0.5-2.5 (3) MG/3ML SOLN Take 3 mLs by nebulization every 6 (six) hours as needed (sob/doe). 12/02/16  Yes Pete Glatter, MD  metFORMIN (GLUCOPHAGE) 1000 MG tablet Take 1 tablet (1,000 mg total) by mouth 2 (two) times daily with a meal. 10/01/16  Yes Josalyn Funches, MD  Multiple Vitamin (MULTIVITAMIN WITH MINERALS) TABS tablet Take 1 tablet by mouth daily.   Yes Historical Provider, MD  pantoprazole (PROTONIX) 40 MG tablet Take 1 tablet (40 mg total) by mouth daily. Take 30-60 min before first meal of the day 09/18/15  Yes Tiffany S Noel, PA-C  predniSONE (DELTASONE) 20 MG tablet Take 3 tablets (60 mg total) by mouth daily. 02/03/17  Yes Cathren Laine, MD  triamcinolone ointment (KENALOG) 0.5 % Apply 1 application topically 2 (two) times daily. 08/21/16  Yes Josalyn Funches, MD  albuterol (PROVENTIL HFA;VENTOLIN HFA) 108 (90 Base) MCG/ACT inhaler Inhale 2 puffs into the lungs every 4 (four) hours as needed for wheezing or shortness of breath. Patient not taking: Reported on 02/10/2017 05/26/16   Dessa Phi, MD  guaiFENesin (MUCINEX) 600 MG 12 hr tablet Take 1 tablet (600 mg total) by mouth 2 (two) times daily as needed for cough or to loosen phlegm. Patient not taking: Reported on 02/10/2017 10/01/16   Dessa Phi, MD  HYDROcodone-acetaminophen (NORCO) 5-325 MG tablet Take 1 tablet by mouth every 6 (six) hours as needed for moderate pain. Patient not taking: Reported on 02/10/2017 12/30/16   Tatyana Kirichenko, PA-C  insulin aspart (NOVOLOG) 100 UNIT/ML injection Inject 2-15 Units into the skin as needed for high blood sugar. 0-120 2 units,  121-150 3 units, 151-160 4 units, 161- 170 7 units, 171-190 10 units, 191- 200 12 units, 201- 300 15 units     Historical Provider, MD    Physical Exam: Patient Vitals for the past 24 hrs:  BP Temp Temp src Pulse Resp SpO2 Height Weight  02/10/17 2245 118/92 - - 100 15 100 % - -  02/10/17 2215 134/95 - - 89 - 100 % - -  02/10/17 2000 125/89 - - 88 (!) 27 100 % - -  02/10/17 1933 114/89 - - 98 22 100 % - -  02/10/17 1752 - - - - - - 5\' 4"  (1.626 m) 98.4 kg (217 lb)  02/10/17 1751 123/94 97.8 F (36.6 C) Oral 95 18 100 % - -    1. General:  in No Acute distress 2. Psychological:  Alert and   Oriented 3. Head/ENT:    Dry Mucous Membranes                          Head Non traumatic, neck supple                            Poor Dentition 4. SKIN: decreased Skin turgor,  Skin clean Dry and intact no rash, Small superficial nodules noted over right calf 5. Heart: Regular rate and rhythm no  Murmur, Rub or gallop 6. Lungs:   no wheezes or crackles   7. Abdomen: Soft, epigastric tender, Non distended 8. Lower extremities: no clubbing, cyanosis, or edema 9. Neurologically Grossly intact, moving all 4 extremities equally   10. MSK: Normal range of motion   body mass index is 37.25 kg/m.  Labs on Admission:   Labs on Admission: I have personally reviewed following labs and imaging studies  CBC:  Recent Labs Lab 02/10/17 1755  WBC 6.3  HGB 14.3  HCT 41.2  MCV 88.4  PLT 382   Basic Metabolic Panel:  Recent Labs Lab 02/10/17 1755  NA 133*  K 3.9  CL 96*  CO2 25  GLUCOSE 243*  BUN 14  CREATININE 1.11*  CALCIUM 9.4   GFR: Estimated Creatinine Clearance: 72.9 mL/min (by C-G formula based on SCr of 1.11 mg/dL (H)). Liver Function Tests:  Recent Labs Lab 02/10/17 1755  AST 46*  ALT 25  ALKPHOS 76  BILITOT 0.5  PROT 7.4  ALBUMIN 3.4*    Recent Labs Lab 02/10/17 1755  LIPASE 191*   No results for input(s): AMMONIA in the last 168 hours. Coagulation Profile: No results for input(s): INR, PROTIME in the last 168 hours. Cardiac Enzymes: No results for input(s):  CKTOTAL, CKMB, CKMBINDEX, TROPONINI in the last 168 hours. BNP (last 3 results) No results for input(s): PROBNP in the last 8760 hours. HbA1C:  Recent Labs  02/10/17 1632  HGBA1C 8.1   CBG:  Recent Labs Lab 02/10/17 1932  GLUCAP 137*   Lipid Profile: No results for input(s): CHOL, HDL, LDLCALC, TRIG, CHOLHDL, LDLDIRECT in the last 72 hours. Thyroid Function Tests: No results for input(s): TSH, T4TOTAL, FREET4, T3FREE, THYROIDAB in the last 72 hours. Anemia Panel: No results for input(s): VITAMINB12, FOLATE, FERRITIN, TIBC, IRON, RETICCTPCT in the last 72 hours.  Sepsis Labs: @LABRCNTIP (procalcitonin:4,lacticidven:4) )No results found for this or any previous visit (from the past 240 hour(s)).    UA  ordered  Lab Results  Component Value Date   HGBA1C 8.1 02/10/2017    Estimated Creatinine Clearance: 72.9 mL/min (by C-G formula based on SCr of 1.11 mg/dL (H)).  BNP (last 3 results) No results for input(s): PROBNP in the last 8760 hours.   ECG REPORT  Independently reviewed Rate:93  Rhythm: SR RBBB LVH ST&T Change: No acute ischemic changes  QTC 494  Filed Weights   02/10/17 1752  Weight: 98.4 kg (217 lb)     Cultures:    Component Value Date/Time   SDES BLOOD RIGHT ANTECUBITAL 05/19/2016 2007   SPECREQUEST BOTTLES DRAWN AEROBIC AND ANAEROBIC 5CC 05/19/2016 2007   CULT NO GROWTH 5 DAYS 05/19/2016 2007   REPTSTATUS 05/24/2016 FINAL 05/19/2016 2007     Radiological Exams on Admission: Dg Chest 2 View  Result Date: 02/10/2017 CLINICAL DATA:  Epigastric pain EXAM: CHEST  2 VIEW COMPARISON:  02/03/2017 FINDINGS: The heart size and  mediastinal contours are within normal limits. Both lungs are clear. The visualized skeletal structures are unremarkable. IMPRESSION: No active cardiopulmonary disease. Electronically Signed   By: Signa Kell M.D.   On: 02/10/2017 21:23   US Abdomen Complete  Result Date: 02/10/2017 CLINICAL DATA:  46 y/o  F; epigastric pain.   Elevated lipase. EXAM: ABDOMEN ULTRASOUND COMPLETE COMPARISON:  04/29/2016 abdominal ultrasound. FINDINGS: Gallbladder: No gallstones or wall thickening visualized. No sonographic Murphy sign noted by sonographer. Common bile duct: Diameter: 4.3 mm Liver: No focal lesion identified. Within normal limits in parenchymal echogenicity. IVC: No abnormality visualized. Pancreas: The pancreas is thickened with heterogeneous echotexture probably representing underlying pancreatitis. Spleen: Size and appearance within normal limits. Right Kidney: Length: 11.0 cm. Simple 1.3 cm cyst. No hydronephrosis. Left Kidney: Length: 11.8 cm. Echogenicity within normal limits. No mass or hydronephrosis visualized. Abdominal aorta: No aneurysm visualized. Bifurcation not visualized. Other findings: Abdominal pain with compression over tail of pancreas. IMPRESSION: Heterogeneous and thickened pancreas with abdominal pain with ultrasound compression over tail of pancreas, likely acute pancreatitis. Otherwise unremarkable abdominal ultrasound. Electronically Signed   By: Mitzi Hansen M.D.   On: 02/10/2017 22:01    Chart has been reviewed    Assessment/Plan   46 y.o. female with medical history significant of asthma/COPD, tobacco abuse, alcohol abuse, chronic pancreatitis chronic diastolic CHF, uncontrolled type 2 diabetes with neuropathy, pulmonary hypertension, HTN admit for  pancreatitis Present on Admission:  . Alcohol-induced chronic pancreatitis (HCC) for tonight keep nothing by mouth supportive management with IV fluids and pain management spoke about importance of discontinuing alcohol abuse . Chronic diastolic CHF (congestive heart failure) (HCC) monitor for any fluid overload . Dyslipidemia associated with type 2 diabetes mellitus (HCC) continue home medications check lipid panel . Essential hypertension, benign patient has been out of her medications restart clonidine the holding parameters .  Uncontrolled type 2 diabetes mellitus with complication (HCC) . Alcohol abuse . COPD (chronic obstructive pulmonary disease) (HCC) -  -  - Will initiate: Steroids started 60 mg a day since this seems to help her. Spoke about importance of weaning down steroids if able  -  Antibiotics    Doxycycline, - Albuterol  PRN, - scheduled duoneb,  -  Breo or Dulera at discharge   -  Mucinex.  Titrate O2 to saturation >90%. Follow patients respiratory status.    Currently mentating well no evidence of symptomatic hypercarbia  . Tobacco abuse nicotine patch ordered Alcohol abuse - CIWA ordered patient was strongly advised to stop drinking Leg pain and swelling doubt DVT but days has history of remote DVTs given sedentary lifestyle and persistent leg pain will obtain Dopplers to rule out  Other plan as per orders.  DVT prophylaxis:  SCD    Code Status:   DNR/DNI   as per patient    Family Communication:   Family   at  Bedside  plan of care was discussed with  Son and Daughter   Disposition Plan:     To home once workup is complete and patient is stable  Consults called: none  Admission status:   inpatient      Level of care     medsurge           I have spent a total of 56 min on this admission    Kemara Quigley 02/10/2017, 12:19 AM    [facility]  Pager [phone]   after 2 AM please page floor coverage PA If 7AM-7PM, please contact the day team taking care of the patient  [email or web address]  Password [email or web address]

## 2017-02-11 ENCOUNTER — Inpatient Hospital Stay (HOSPITAL_COMMUNITY): Payer: Medicaid Other

## 2017-02-11 DIAGNOSIS — K860 Alcohol-induced chronic pancreatitis: Secondary | ICD-10-CM

## 2017-02-11 DIAGNOSIS — J441 Chronic obstructive pulmonary disease with (acute) exacerbation: Secondary | ICD-10-CM

## 2017-02-11 DIAGNOSIS — I5032 Chronic diastolic (congestive) heart failure: Secondary | ICD-10-CM

## 2017-02-11 DIAGNOSIS — K852 Alcohol induced acute pancreatitis without necrosis or infection: Principal | ICD-10-CM

## 2017-02-11 DIAGNOSIS — M79609 Pain in unspecified limb: Secondary | ICD-10-CM

## 2017-02-11 DIAGNOSIS — F1010 Alcohol abuse, uncomplicated: Secondary | ICD-10-CM

## 2017-02-11 LAB — LIPID PANEL
CHOLESTEROL: 106 mg/dL (ref 0–200)
HDL: 49 mg/dL (ref >40)
LDL Cholesterol: 13 mg/dL (ref 0–99)
TRIGLYCERIDES: 218 mg/dL — AB (ref <150)
Total CHOL/HDL Ratio: 2.2 RATIO
VLDL: 44 mg/dL — ABNORMAL HIGH (ref 0–40)

## 2017-02-11 LAB — RAPID URINE DRUG SCREEN, HOSP PERFORMED
AMPHETAMINES: NOT DETECTED
BARBITURATES: NOT DETECTED
Benzodiazepines: NOT DETECTED
COCAINE: NOT DETECTED
OPIATES: POSITIVE — AB
TETRAHYDROCANNABINOL: NOT DETECTED

## 2017-02-11 LAB — COMPREHENSIVE METABOLIC PANEL
ALBUMIN: 2.7 g/dL — AB (ref 3.5–5.0)
ALT: 19 U/L (ref 14–54)
AST: 28 U/L (ref 15–41)
Alkaline Phosphatase: 55 U/L (ref 38–126)
Anion gap: 11 (ref 5–15)
BUN: 12 mg/dL (ref 6–20)
CO2: 23 mmol/L (ref 22–32)
CREATININE: 0.8 mg/dL (ref 0.44–1.00)
Calcium: 8.3 mg/dL — ABNORMAL LOW (ref 8.9–10.3)
Chloride: 98 mmol/L — ABNORMAL LOW (ref 101–111)
GFR calc Af Amer: >60 mL/min (ref >60)
GFR calc non Af Amer: >60 mL/min (ref >60)
GLUCOSE: 249 mg/dL — AB (ref 65–99)
POTASSIUM: 3.2 mmol/L — AB (ref 3.5–5.1)
SODIUM: 132 mmol/L — AB (ref 135–145)
Total Bilirubin: 0.6 mg/dL (ref 0.3–1.2)
Total Protein: 5.7 g/dL — ABNORMAL LOW (ref 6.5–8.1)

## 2017-02-11 LAB — GLUCOSE, CAPILLARY
GLUCOSE-CAPILLARY: 219 mg/dL — AB (ref 65–99)
GLUCOSE-CAPILLARY: 446 mg/dL — AB (ref 65–99)
GLUCOSE-CAPILLARY: 409 mg/dL — AB (ref 65–99)
Glucose-Capillary: 311 mg/dL — ABNORMAL HIGH (ref 65–99)
Glucose-Capillary: 241 mg/dL — ABNORMAL HIGH (ref 65–99)
Glucose-Capillary: >600 mg/dL (ref 65–99)
Glucose-Capillary: >600 mg/dL (ref 65–99)

## 2017-02-11 LAB — LIPASE, BLOOD: Lipase: 109 U/L — ABNORMAL HIGH (ref 11–51)

## 2017-02-11 LAB — URINALYSIS, ROUTINE W REFLEX MICROSCOPIC
Bilirubin Urine: NEGATIVE
Glucose, UA: >=500 mg/dL — AB
Hgb urine dipstick: NEGATIVE
Ketones, ur: 5 mg/dL — AB
NITRITE: NEGATIVE
Protein, ur: NEGATIVE mg/dL
SPECIFIC GRAVITY, URINE: 1.013 (ref 1.005–1.030)
pH: 5 (ref 5.0–8.0)

## 2017-02-11 LAB — CBC
HEMATOCRIT: 35.4 % — AB (ref 36.0–46.0)
Hemoglobin: 11.8 g/dL — ABNORMAL LOW (ref 12.0–15.0)
MCH: 29.6 pg (ref 26.0–34.0)
MCHC: 33.3 g/dL (ref 30.0–36.0)
MCV: 88.7 fL (ref 78.0–100.0)
Platelets: 270 10*3/uL (ref 150–400)
RBC: 3.99 MIL/uL (ref 3.87–5.11)
RDW: 13 % (ref 11.5–15.5)
WBC: 7.3 10*3/uL (ref 4.0–10.5)

## 2017-02-11 LAB — TSH: TSH: 1.808 u[IU]/mL (ref 0.350–4.500)

## 2017-02-11 LAB — PHOSPHORUS: PHOSPHORUS: 3.8 mg/dL (ref 2.5–4.6)

## 2017-02-11 LAB — MRSA PCR SCREENING: MRSA by PCR: NEGATIVE

## 2017-02-11 LAB — GLUCOSE, RANDOM: Glucose, Bld: 569 mg/dL (ref 65–99)

## 2017-02-11 LAB — MAGNESIUM: Magnesium: 1.5 mg/dL — ABNORMAL LOW (ref 1.7–2.4)

## 2017-02-11 MED ORDER — FOLIC ACID 1 MG PO TABS
1.0000 mg | ORAL_TABLET | Freq: Every day | ORAL | Status: DC
  Administered 2017-02-11 – 2017-02-12 (×2): 1 mg via ORAL
  Filled 2017-02-11 (×3): qty 1

## 2017-02-11 MED ORDER — ATORVASTATIN CALCIUM 20 MG PO TABS
20.0000 mg | ORAL_TABLET | Freq: Every day | ORAL | Status: DC
  Administered 2017-02-11 (×2): 20 mg via ORAL
  Filled 2017-02-11 (×2): qty 1

## 2017-02-11 MED ORDER — INSULIN ASPART 100 UNIT/ML ~~LOC~~ SOLN
10.0000 [IU] | Freq: Three times a day (TID) | SUBCUTANEOUS | Status: DC
  Administered 2017-02-11: 10 [IU] via SUBCUTANEOUS

## 2017-02-11 MED ORDER — LORAZEPAM 1 MG PO TABS
1.0000 mg | ORAL_TABLET | Freq: Four times a day (QID) | ORAL | Status: DC | PRN
  Filled 2017-02-11: qty 1

## 2017-02-11 MED ORDER — PANTOPRAZOLE SODIUM 40 MG PO TBEC
40.0000 mg | DELAYED_RELEASE_TABLET | Freq: Every day | ORAL | Status: DC
  Administered 2017-02-11 – 2017-02-12 (×3): 40 mg via ORAL
  Filled 2017-02-11 (×3): qty 1

## 2017-02-11 MED ORDER — IPRATROPIUM-ALBUTEROL 0.5-2.5 (3) MG/3ML IN SOLN
3.0000 mL | Freq: Four times a day (QID) | RESPIRATORY_TRACT | Status: DC
  Administered 2017-02-11 (×3): 3 mL via RESPIRATORY_TRACT
  Filled 2017-02-11 (×3): qty 3

## 2017-02-11 MED ORDER — VITAMIN B-1 100 MG PO TABS
100.0000 mg | ORAL_TABLET | Freq: Every day | ORAL | Status: DC
  Administered 2017-02-11 – 2017-02-12 (×2): 100 mg via ORAL
  Filled 2017-02-11 (×2): qty 1

## 2017-02-11 MED ORDER — INSULIN ASPART 100 UNIT/ML ~~LOC~~ SOLN: 0.0000 [IU] | Freq: Every day | SUBCUTANEOUS | Status: DC

## 2017-02-11 MED ORDER — PREDNISONE 20 MG PO TABS: 20.0000 mg | ORAL_TABLET | Freq: Every day | ORAL | Status: DC

## 2017-02-11 MED ORDER — BOOST / RESOURCE BREEZE PO LIQD: 1.0000 | Freq: Two times a day (BID) | ORAL | Status: DC

## 2017-02-11 MED ORDER — THIAMINE HCL 100 MG/ML IJ SOLN
100.0000 mg | Freq: Every day | INTRAMUSCULAR | Status: DC
  Filled 2017-02-11: qty 2

## 2017-02-11 MED ORDER — SODIUM CHLORIDE 0.9 % IV SOLN
INTRAVENOUS | Status: DC
  Administered 2017-02-11 – 2017-02-12 (×2): via INTRAVENOUS

## 2017-02-11 MED ORDER — GABAPENTIN 300 MG PO CAPS
900.0000 mg | ORAL_CAPSULE | Freq: Three times a day (TID) | ORAL | Status: DC
  Administered 2017-02-11 – 2017-02-12 (×5): 900 mg via ORAL
  Filled 2017-02-11 (×6): qty 3

## 2017-02-11 MED ORDER — INSULIN GLARGINE 100 UNIT/ML ~~LOC~~ SOLN
60.0000 [IU] | Freq: Every day | SUBCUTANEOUS | Status: DC
  Administered 2017-02-11 (×2): 60 [IU] via SUBCUTANEOUS
  Filled 2017-02-11 (×3): qty 0.6

## 2017-02-11 MED ORDER — AMITRIPTYLINE HCL 50 MG PO TABS
75.0000 mg | ORAL_TABLET | Freq: Every day | ORAL | Status: DC
  Administered 2017-02-11 (×2): 75 mg via ORAL
  Filled 2017-02-11 (×2): qty 1

## 2017-02-11 MED ORDER — DOXYCYCLINE HYCLATE 100 MG PO TABS
100.0000 mg | ORAL_TABLET | Freq: Two times a day (BID) | ORAL | Status: DC
  Administered 2017-02-11 – 2017-02-12 (×4): 100 mg via ORAL
  Filled 2017-02-11 (×4): qty 1

## 2017-02-11 MED ORDER — LORAZEPAM 2 MG/ML IJ SOLN: 1.0000 mg | Freq: Four times a day (QID) | INTRAMUSCULAR | Status: DC | PRN

## 2017-02-11 MED ORDER — INSULIN ASPART 100 UNIT/ML ~~LOC~~ SOLN
0.0000 [IU] | SUBCUTANEOUS | Status: DC
  Administered 2017-02-11: 7 [IU] via SUBCUTANEOUS
  Administered 2017-02-11: 3 [IU] via SUBCUTANEOUS

## 2017-02-11 MED ORDER — INSULIN ASPART 100 UNIT/ML ~~LOC~~ SOLN
15.0000 [IU] | Freq: Once | SUBCUTANEOUS | Status: AC
  Administered 2017-02-11: 15 [IU] via SUBCUTANEOUS

## 2017-02-11 MED ORDER — ONDANSETRON HCL 4 MG PO TABS: 4.0000 mg | ORAL_TABLET | Freq: Four times a day (QID) | ORAL | Status: DC | PRN

## 2017-02-11 MED ORDER — GUAIFENESIN ER 600 MG PO TB12
600.0000 mg | ORAL_TABLET | Freq: Two times a day (BID) | ORAL | Status: DC | PRN
  Administered 2017-02-11: 600 mg via ORAL
  Filled 2017-02-11: qty 1

## 2017-02-11 MED ORDER — INSULIN ASPART 100 UNIT/ML ~~LOC~~ SOLN
0.0000 [IU] | Freq: Three times a day (TID) | SUBCUTANEOUS | Status: DC
  Administered 2017-02-12: 8 [IU] via SUBCUTANEOUS

## 2017-02-11 MED ORDER — ACETAMINOPHEN 650 MG RE SUPP: 650.0000 mg | Freq: Four times a day (QID) | RECTAL | Status: DC | PRN

## 2017-02-11 MED ORDER — SODIUM CHLORIDE 0.9 % IV SOLN
INTRAVENOUS | Status: AC
  Administered 2017-02-11: 1000 mL via INTRAVENOUS

## 2017-02-11 MED ORDER — ADULT MULTIVITAMIN W/MINERALS CH
1.0000 | ORAL_TABLET | Freq: Every day | ORAL | Status: DC
  Administered 2017-02-11: 1 via ORAL
  Filled 2017-02-11: qty 1

## 2017-02-11 MED ORDER — PREDNISONE 50 MG PO TABS
60.0000 mg | ORAL_TABLET | Freq: Every day | ORAL | Status: DC
  Administered 2017-02-11: 60 mg via ORAL
  Filled 2017-02-11: qty 1

## 2017-02-11 MED ORDER — MORPHINE SULFATE (PF) 2 MG/ML IV SOLN
2.0000 mg | INTRAVENOUS | Status: DC | PRN
  Administered 2017-02-11 – 2017-02-12 (×3): 2 mg via INTRAVENOUS
  Filled 2017-02-11 (×3): qty 1

## 2017-02-11 MED ORDER — GEMFIBROZIL 600 MG PO TABS
600.0000 mg | ORAL_TABLET | Freq: Two times a day (BID) | ORAL | Status: DC
  Administered 2017-02-11 – 2017-02-12 (×3): 600 mg via ORAL
  Filled 2017-02-11 (×5): qty 1

## 2017-02-11 MED ORDER — ADULT MULTIVITAMIN W/MINERALS CH: 1.0000 | ORAL_TABLET | Freq: Every day | ORAL | Status: DC

## 2017-02-11 MED ORDER — NICOTINE 21 MG/24HR TD PT24
21.0000 mg | MEDICATED_PATCH | Freq: Every day | TRANSDERMAL | Status: DC
  Administered 2017-02-12: 21 mg via TRANSDERMAL
  Filled 2017-02-11 (×2): qty 1

## 2017-02-11 MED ORDER — ONDANSETRON HCL 4 MG/2ML IJ SOLN: 4.0000 mg | Freq: Four times a day (QID) | INTRAMUSCULAR | Status: DC | PRN

## 2017-02-11 MED ORDER — CLONIDINE HCL 0.1 MG PO TABS
0.1000 mg | ORAL_TABLET | Freq: Two times a day (BID) | ORAL | Status: DC
  Administered 2017-02-11 – 2017-02-12 (×4): 0.1 mg via ORAL
  Filled 2017-02-11 (×4): qty 1

## 2017-02-11 MED ORDER — IPRATROPIUM-ALBUTEROL 0.5-2.5 (3) MG/3ML IN SOLN: 3.0000 mL | Freq: Four times a day (QID) | RESPIRATORY_TRACT | Status: DC

## 2017-02-11 MED ORDER — LEVALBUTEROL HCL 1.25 MG/0.5ML IN NEBU
1.2500 mg | INHALATION_SOLUTION | RESPIRATORY_TRACT | Status: DC | PRN
  Administered 2017-02-12: 1.25 mg via RESPIRATORY_TRACT
  Filled 2017-02-11: qty 0.5

## 2017-02-11 MED ORDER — ACETAMINOPHEN 325 MG PO TABS: 650.0000 mg | ORAL_TABLET | Freq: Four times a day (QID) | ORAL | Status: DC | PRN

## 2017-02-11 NOTE — Progress Notes (Signed)
RN walked into room- patient asked for her pain medication to be increased from what she was having in the ER. Patient asked that she also receive percocet in between her morphine. RN called NP. Patient then asked for all her guest to spend the night- charge nurse explained how this would be a fire hazard; Patient then became upset and asked to be moved to another floor. Then RN tried to talk to patient about being NPO- she stated that she was given a full meal downstairs and can not understand why everything had changed since moving up to the floor. Patient's family became upset with this RN about NPO order, pain medication, and not be able to spend the night. Patient then again asked to be moved. AC was called, orders were place for patient to be moved. Report has been called to [id number].

## 2017-02-11 NOTE — Progress Notes (Signed)
CRITICAL VALUE ALERT  Critical value received: CBG:409  Date of notification: 02/11/17  Time of notification: 1750  Critical value read back: Yes  Nurse who received alert: Ileene Rubens  MD notified (1st page): Rai  Time of first page: 1752  Responding MD: Rai  Time MD responded: 40  Verbal order to give 15 units of novolog. Orders followed.

## 2017-02-11 NOTE — Progress Notes (Signed)
Inpatient Diabetes Program Recommendations  AACE/ADA: New Consensus Statement on Inpatient Glycemic Control (2015)  Target Ranges:  Prepandial:   less than 140 mg/dL      Peak postprandial:   less than 180 mg/dL (1-2 hours)      Critically ill patients:  140 - 180 mg/dL   Lab Results  Component Value Date   GLUCAP 446 (H) 02/11/2017   HGBA1C 8.1 02/10/2017    Review of Glycemic Control:  Results for IZETTA, BARRANCO (MRN [id number]) as of 02/11/2017 12:35  Ref. Range 02/10/2017 19:32 02/11/2017 02:17 02/11/2017 09:00 02/11/2017 09:08 02/11/2017 12:14  Glucose-Capillary Latest Ref Range: 65 - 99 mg/dL 353 (H) 614 (H) 431 (H) 241 (H) 446 (H)    Diabetes history: Type 2 diabetes Outpatient Diabetes medications: Novolog 10 units with breakfast and lunch, Novolog 15 units with supper, Lantus 60 units q HS Current orders for Inpatient glycemic control:  Lantus 60 units q HS, Novolog moderate tid with meals  Inpatient Diabetes Program Recommendations:    Please consider restarting patient's home dose of Novolog 10 units tid with meals-Hold if patient eats less than 50%.   Thanks, Beryl Meager, RN, BC-ADM Inpatient Diabetes Coordinator Pager [phone] (8a-5p)

## 2017-02-11 NOTE — Progress Notes (Signed)
Order expired for continuous fluids. Rai, MD notified regarding whether fluids should be continued or stopped. Orders placed. Will continue to monitor.

## 2017-02-11 NOTE — Progress Notes (Addendum)
CRITICAL VALUE ALERT  Critical value received:  CBG of >600 - Lab confirmed critical CBG of 569  Date of notification:  02/11/2017  Time of notification:  2148  - Lab called with confirmed notification at 2242  Critical value read back:Yes.    Nurse who received alert:  Candise Bowens   MD notified (1st page): Schorr  Time of first page:  2150  MD notified (2nd page): Schorr  Time of second page: 2243  Responding MD: Schorr  Time MD responded: 2311 - new order placed for stat BMP

## 2017-02-11 NOTE — Progress Notes (Signed)
Subjective:  Patient ID: Carla Hicks, female    DOB: 01-23-1971  Age: 46 y.o. MRN: [id number]  CC: abdominal pain   HPI Carla Hicks is a 46 y.o. female with a PMH of COPD, DM2, and HTN presents with one day history of epigastric pain. Was in the ED for the past two consecutive days for COPD exacerbation, cyst of skin, neuropathy, and right calf pain. COPD exacerbation resolved and she had a trigger point injection for myofascial pain done on the right calf with 0.5% bupivacaine. Epigastric pain did not occur until after her discharge from emergency department yesterday. She is currently nauseated and has vomited a "yellowish and foul" content. Pain is progressively worsening and is now radiating to her back. Denies chest pain, shortness of breath, headache, rash, GU symptoms, fever, or chills.    Facility-Administered Medications Prior to Visit  Medication Dose Route Frequency Provider Last Rate Last Dose  . ipratropium-albuterol (DUONEB) 0.5-2.5 (3) MG/3ML nebulizer solution 3 mL  3 mL Nebulization Q6H Pete Glatter, MD   3 mL at 12/02/16 1309   Outpatient Medications Prior to Visit  Medication Sig Dispense Refill  . albuterol (PROVENTIL HFA;VENTOLIN HFA) 108 (90 Base) MCG/ACT inhaler Inhale 2 puffs into the lungs every 4 (four) hours as needed for wheezing or shortness of breath. (Patient not taking: Reported on 02/10/2017) 54 g 3  . albuterol (PROVENTIL HFA;VENTOLIN HFA) 108 (90 Base) MCG/ACT inhaler Inhale 2 puffs into the lungs every 4 (four) hours as needed for wheezing or shortness of breath. 1 Inhaler 1  . amitriptyline (ELAVIL) 25 MG tablet Take 3 tablets (75 mg total) by mouth at bedtime. 90 tablet 0  . atorvastatin (LIPITOR) 20 MG tablet TAKE 1 TABLET BY MOUTH DAILY AT 6 PM 30 tablet 5  . budesonide-formoterol (SYMBICORT) 80-4.5 MCG/ACT inhaler Take 2 puffs first thing in am and then another 2 puffs about 12 hours later. 3 Inhaler 3  . cloNIDine (CATAPRES) 0.1 MG tablet  Take 1 tablet (0.1 mg total) by mouth 2 (two) times daily. 60 tablet 11  . diclofenac sodium (VOLTAREN) 1 % GEL Apply 1 application topically 4 (four) times daily. (Patient taking differently: Apply 1 application topically 4 (four) times daily as needed (pain.). ) 100 g 0  . gabapentin (NEURONTIN) 300 MG capsule Take 2 capsules (600 mg total) by mouth 3 (three) times daily. (Patient taking differently: Take 900 mg by mouth 3 (three) times daily. ) 180 capsule 5  . gemfibrozil (LOPID) 600 MG tablet Take 1 tablet (600 mg total) by mouth 2 (two) times daily before a meal. 60 tablet 12  . guaiFENesin (MUCINEX) 600 MG 12 hr tablet Take 1 tablet (600 mg total) by mouth 2 (two) times daily as needed for cough or to loosen phlegm. (Patient not taking: Reported on 02/10/2017) 60 tablet 5  . guaiFENesin-codeine 100-10 MG/5ML syrup Take 10 mLs by mouth 3 (three) times daily as needed for cough. 900 mL 1  . HYDROcodone-acetaminophen (NORCO) 5-325 MG tablet Take 1 tablet by mouth every 6 (six) hours as needed for moderate pain. (Patient not taking: Reported on 02/10/2017) 6 tablet 0  . ibuprofen (ADVIL) 200 MG tablet Take 1-2 tablets (200-400 mg total) by mouth every 6 (six) hours as needed for mild pain or moderate pain. (Patient taking differently: Take 800 mg by mouth every 6 (six) hours as needed for mild pain or moderate pain. ) 30 tablet 0  . insulin aspart (NOVOLOG) 100 UNIT/ML injection  Inject 10 U with breakfast and  lunch, 15 U with dinner (Patient taking differently: Inject 10-15 Units into the skin 3 (three) times daily with meals. 10 units with breakfast then 10 units with lunch then 15 units with dinner (evening meal)) 10 mL 4  . insulin aspart (NOVOLOG) 100 UNIT/ML injection Inject 2-15 Units into the skin as needed for high blood sugar. 0-120 2 units,  121-150 3 units, 151-160 4 units, 161- 170 7 units, 171-190 10 units, 191- 200 12 units, 201- 300 15 units    . insulin glargine (LANTUS) 100 UNIT/ML  injection Inject 0.6 mLs (60 Units total) into the skin at bedtime. 20 mL 11  . ipratropium-albuterol (DUONEB) 0.5-2.5 (3) MG/3ML SOLN Take 3 mLs by nebulization every 6 (six) hours as needed (sob/doe). 360 mL 3  . metFORMIN (GLUCOPHAGE) 1000 MG tablet Take 1 tablet (1,000 mg total) by mouth 2 (two) times daily with a meal. 180 tablet 3  . Multiple Vitamin (MULTIVITAMIN WITH MINERALS) TABS tablet Take 1 tablet by mouth daily.    . pantoprazole (PROTONIX) 40 MG tablet Take 1 tablet (40 mg total) by mouth daily. Take 30-60 min before first meal of the day 30 tablet 2  . predniSONE (DELTASONE) 20 MG tablet Take 3 tablets (60 mg total) by mouth daily. 15 tablet 0  . triamcinolone ointment (KENALOG) 0.5 % Apply 1 application topically 2 (two) times daily. 30 g 0  . esomeprazole (NEXIUM) 20 MG capsule Take 20 mg by mouth daily before breakfast.        ROS Review of Systems  Constitutional: Positive for malaise/fatigue. Negative for chills and fever.  Respiratory: Negative for shortness of breath.   Cardiovascular: Negative for chest pain.  Gastrointestinal: Positive for abdominal pain, nausea and vomiting.  Genitourinary: Negative for dysuria.  Musculoskeletal: Positive for back pain.  Skin: Negative for rash.  Neurological: Negative for headaches.    Objective:  BP (!) 172/131   Pulse 99   Temp 98 F (36.7 C) (Oral)   Resp 16   SpO2 98%   BP/Weight 02/11/2017 02/10/2017 02/10/2017  Systolic BP 113 - 172  Diastolic BP 75 - 096  Wt. (Lbs) 209.4 - -  BMI - 35.94 -      Physical Exam  Constitutional: She is oriented to person, place, and time.  Dry heaving into garbage can, appears to be in pain  HENT:  Head: Normocephalic and atraumatic.  Eyes: Conjunctivae are normal. No scleral icterus.  Neck: No thyromegaly present.  Cardiovascular: Normal rate, regular rhythm and normal heart sounds.   Pulmonary/Chest: Effort normal and breath sounds normal.  Abdominal: She exhibits no  distension. There is tenderness (Epigastric).  Musculoskeletal: She exhibits no edema.  Neurological: She is alert and oriented to person, place, and time.  Skin: Skin is warm and dry. No rash noted. She is not diaphoretic.  Psychiatric: Her behavior is normal.     Assessment & Plan:   1. Type 2 diabetes mellitus with neurological complications (HCC) - Urinalysis Dipstick- order cancelled due to patient unable to collect sample - Glucose (CBG) 430 - HgB A1c 8.1 - insulin aspart (novoLOG) injection 20 Units; Inject 0.2 mLs (20 Units total) into the skin once. - Glucose (CBG), Fasting 434 - insulin aspart (novoLOG) injection 10 Units; Inject 0.1 mLs (10 Units total) into the skin once.  2. Hypertension, unspecified type - cloNIDine (CATAPRES) tablet 0.1 mg; Take 1 tablet (0.1 mg total) by mouth once.  3. Nausea -likely  associated to suspected pancreatitis.  4. Epigastric pain - symptoms consistent with pancreatitis but will have to go to ED for further STAT evaluation and possible imaging. - ketorolac (TORADOL) 30 MG/ML injection 30 mg; Inject 1 mL (30 mg total) into the muscle once.   Meds ordered this encounter  Medications  . cloNIDine (CATAPRES) tablet 0.1 mg  . DISCONTD: insulin aspart (novoLOG) injection 10 Units  . insulin aspart (novoLOG) injection 20 Units  . ketorolac (TORADOL) 30 MG/ML injection 30 mg  . insulin aspart (novoLOG) injection 10 Units    Follow-up: Return after discharge from hospital.   Loletta Specter PA

## 2017-02-11 NOTE — Progress Notes (Signed)
VASCULAR LAB PRELIMINARY  PRELIMINARY  PRELIMINARY  PRELIMINARY  Bilateral lower extremity venous duplex completed.    Preliminary report:  There is no obvious evidence of DVT or SVT noted in the bilateral lower extremities.   Carla Hicks, RVT 02/11/2017, 10:44 AM

## 2017-02-11 NOTE — Progress Notes (Signed)
Patient asked for something extra for break-through pain. NP paged.

## 2017-02-11 NOTE — Progress Notes (Signed)
Patient is refusing to be NPO for [place] of abdomen. [place] department stated that patient had one completed yesterday. Rai, MD notified. Verbal order to D/C order for abdominal [place] department notified. Will continue to monitor.

## 2017-02-11 NOTE — Progress Notes (Signed)
Pt went off unit; RN found pt in [place] ordering food. RN told pt she is not to be eating as she is on a clear liquid diet, blood sugars are extremely high and lab needs to draw blood. Pt became argumentative stating she is "fine" and "I am going to eat, if I don't eat here my son is going to be bringing me something." Pt also upset that no doctor came to see her today and this is why she is still on a clear liquid diet.  RN again educated pt and pt still refused. Pt brought back up to floor, lab was called back to obtain BMP. Will update MD on non compliance.

## 2017-02-11 NOTE — Progress Notes (Signed)
Initial Nutrition Assessment  DOCUMENTATION CODES:   Obesity unspecified  INTERVENTION:  Provide Boost Breeze po BID, each supplement provides 250 kcal and 9 grams of protein.  Encourage adequate PO intake.   NUTRITION DIAGNOSIS:   Increased nutrient needs related to chronic illness as evidenced by estimated needs.  GOAL:   Patient will meet greater than or equal to 90% of their needs  MONITOR:   PO intake, Supplement acceptance, Diet advancement, Labs, Weight trends, Skin, I & O's  REASON FOR ASSESSMENT:   Consult Assessment of nutrition requirement/status  ASSESSMENT:    46 year old female with history of pancreatitis, diabetes, COPD/asthma, alcohol abuse, hyperlipidemia presented with epigastric abdominal pain, started on the morning of admission.  She also reported that she consumed a 40 ounce beer per day before the admission and continue his to smoke cigarettes.  Lipase was 191, patient was admitted for acute pancreatitis.  Pt is currently on a clear liquid diet and dislikes its. Pt reports wanting solid foods. Pt unable to stay awake as she was very tired/sleepy. Son at bedside. He reports pt with no po over the past 3 days PTA due to n/v and abdominal pains. Son reports pt was eating well prior to acute pancreatitis. He reports pt usually consumes 1 large meal a day with multiple snacks throughout the day. Question accuracy of recent recorded weight as admission weight on 02/10/17 was 217 lbs. RD to order Boost Breeze to aid in caloric and protein needs. Noted po intake at breakfast this AM was 100%. RD to continue to monitor.   Pt with no observed significant fat or muscle mass loss.   Labs and medications reviewed. CBGs 219-446 mg/dL.  Diet Order:  Diet clear liquid Room service appropriate? Yes; Fluid consistency: Thin  Skin:  Reviewed, no issues  Last BM:  2/20  Height:   Ht Readings from Last 1 Encounters:  02/11/17 5\' 4"  (1.626 m)    Weight:   Wt  Readings from Last 1 Encounters:  02/11/17 209 lb 6.4 oz (95 kg)    Ideal Body Weight:  54.5 kg  BMI:  Body mass index is 35.94 kg/m.  Estimated Nutritional Needs:   Kcal:  1900-2050  Protein:  100-115 grams  Fluid:  1.9 -2 L/day  EDUCATION NEEDS:   No education needs identified at this time  Roslyn Smiling, MS, RD, LDN Pager # [phone] After hours/ weekend pager # [phone]

## 2017-02-11 NOTE — Progress Notes (Signed)
[facility]                                                                              Patient Demographics  Carla Hicks, is a 46 y.o. female, DOB - [birth year], XBJ:[id number]  Admit date - 02/10/2017   Admitting Physician Therisa Doyne, MD  Outpatient Primary MD for the patient is Lora Paula, MD  Outpatient specialists:   LOS - 1  days    Chief Complaint  Patient presents with  . Abdominal Pain       Brief summary   Patient is a 46 year old female with history of pancreatitis, diabetes, COPD/asthma, alcohol abuse, hyperlipidemia presented with epigastric abdominal pain, started on the morning of admission. [facility] was sent to ED to rule out pancreatitis. Patient described pain as in epigastric region radiating to the mid back, with the nausea and vomiting, poor appetite. She also reported for the last one week with productive cough and wheezing. She was seen in ED on 2/14 and was given DuoNeb and Solu-Medrol and was discharged with prednisone. She also reported that she consumed a 40 ounce beer per day before the admission and continue his to smoke cigarettes.  Lipase was 191, patient was admitted for acute pancreatitis.  Assessment & Plan    Principal Problem: Acute  Pancreatitis:  - Likely alcohol-induced pancreatitis however rule out any gallstones, obtain right upper quadrant abdominal ultrasound  - Lipase improving, continue clear liquid diet, IV fluids and pain control - Patient counseled strongly on alcohol cessation   Active Problems: COPD with acute exacerbation - Currently no significant wheezing, taper steroids, prednisone 20 mg in a.m. - Continue duo nebs every 6 hours - Continue doxycycline    Essential hypertension, benign - Currently stable, continue clonidine    Dyslipidemia associated with type 2 diabetes mellitus (HCC) - Continue Lopid    Chronic diastolic CHF  (congestive heart failure) (HCC) -Currently euvolemic, continue gentle hydration, follow I's and O's     Uncontrolled type 2 diabetes mellitus with complication (HCC) - CBGs in 400s, increase sliding scale to moderate, continue Lantus  - Obtain hemoglobin A1c  - Taper prednisone to 20 mg daily  - Hold metformin    Alcohol abuse -  place on  CIWA protocol with ativan - Patient counseled on alcohol cessation     Tobacco abuse - Continue nicotine patch    Code Status: DO NOT RESUSCITATE DVT Prophylaxis:   SCD's Family Communication: Discussed in detail with the patient, all imaging results, lab results explained to the patient  Disposition Plan:   Time Spent in minutes  25 minutes  Procedures:    Consultants:     Antimicrobials:      Medications  Scheduled Meds: . amitriptyline  75 mg Oral QHS  . atorvastatin  20 mg Oral q1800  . cloNIDine  0.1 mg Oral BID  . doxycycline  100 mg Oral Q12H  . folic acid  1 mg Oral Daily  . gabapentin  900 mg Oral TID  . gemfibrozil  600 mg Oral BID AC  . insulin aspart  0-9 Units Subcutaneous Q4H  . insulin glargine  60 Units Subcutaneous QHS  . ipratropium-albuterol  3 mL Nebulization Q6H  . multivitamin with minerals  1 tablet Oral Q1200  . nicotine  21 mg Transdermal Daily  . pantoprazole  40 mg Oral Daily  . predniSONE  60 mg Oral Daily  . thiamine  100 mg Oral Daily   Or  . thiamine  100 mg Intravenous Daily   Continuous Infusions: . sodium chloride     PRN Meds:.acetaminophen **OR** acetaminophen, guaiFENesin, levalbuterol, LORazepam **OR** LORazepam, morphine injection, ondansetron **OR** ondansetron (ZOFRAN) IV   Antibiotics   Anti-infectives    Start     Dose/Rate Route Frequency Ordered Stop   02/11/17 0100  doxycycline (VIBRA-TABS) tablet 100 mg     100 mg Oral Every 12 hours 02/11/17 0050          Subjective:   Matthew Pais was seen and examined today. Very sleepy but easily arousable, still  complaining of pain in the epigastric region radiating to the upper back, 5/10, dull and achy. No fevers or chills. Patient denies dizziness, chest pain, shortness of breath, N/V/D/C, new weakness, numbess, tingling. No acute events overnight.    Objective:   Vitals:   02/11/17 0150 02/11/17 0300 02/11/17 0922 02/11/17 1002  BP: 113/75  116/87 107/71  Pulse: 98   96  Resp: 20 20  19   Temp: 98 F (36.7 C)   99.1 F (37.3 C)  TempSrc: Oral   Oral  SpO2: 97%   97%  Weight: 95 kg (209 lb 6.4 oz)     Height: 5\' 4"  (1.626 m)       Intake/Output Summary (Last 24 hours) at 02/11/17 1215 Last data filed at 02/11/17 1025  Gross per 24 hour  Intake           841.67 ml  Output              200 ml  Net           641.67 ml     Wt Readings from Last 3 Encounters:  02/11/17 95 kg (209 lb 6.4 oz)  02/03/17 98.4 kg (217 lb)  01/04/17 97.6 kg (215 lb 3.2 oz)     Exam  General: Sleepy but arousable, NAD  HEENT:    Neck: Supple, no JVD, no masses  Cardiovascular: S1 S2 auscultated, no rubs, murmurs or gallops. Regular rate and rhythm.  Respiratory: No significant wheezing  Gastrointestinal: Soft, nontender, nondistended, + bowel sounds  Ext: no cyanosis clubbing or edema  Neuro: Cr N's II- XII. Strength 5/5 upper and lower extremities bilaterally  Skin: No rashes  Psych:   sleepy but arousable    Data Reviewed:  I have personally reviewed following labs and imaging studies  Micro Results Recent Results (from the past 240 hour(s))  MRSA PCR Screening     Status: None   Collection Time: 02/11/17  3:07 AM  Result Value Ref Range Status   MRSA by PCR NEGATIVE NEGATIVE Final    Comment:        The GeneXpert MRSA Assay (FDA approved for NASAL specimens only), is one component of a comprehensive MRSA colonization surveillance program. It is not intended to diagnose MRSA infection nor to guide or monitor treatment for MRSA infections.     Radiology Reports Dg Chest  2 View  Result Date: 02/10/2017 CLINICAL DATA:  Epigastric pain EXAM: CHEST  2 VIEW COMPARISON:  02/03/2017 FINDINGS: The heart size  and mediastinal contours are within normal limits. Both lungs are clear. The visualized skeletal structures are unremarkable. IMPRESSION: No active cardiopulmonary disease. Electronically Signed   By: Signa Kell M.D.   On: 02/10/2017 21:23   Dg Chest 2 View  Result Date: 02/03/2017 CLINICAL DATA:  Acute onset of shortness of breath and productive cough. Initial encounter. EXAM: CHEST  2 VIEW COMPARISON:  Chest radiograph performed 12/30/2016 FINDINGS: The lungs are well-aerated. Pulmonary vascularity is at the upper limits of normal. There is no evidence of focal opacification, pleural effusion or pneumothorax. The heart is borderline normal in size. No acute osseous abnormalities are seen. IMPRESSION: No acute cardiopulmonary process seen. Electronically Signed   By: Roanna Raider M.D.   On: 02/03/2017 21:54   US Abdomen Complete  Result Date: 02/10/2017 CLINICAL DATA:  46 y/o  F; epigastric pain.  Elevated lipase. EXAM: ABDOMEN ULTRASOUND COMPLETE COMPARISON:  04/29/2016 abdominal ultrasound. FINDINGS: Gallbladder: No gallstones or wall thickening visualized. No sonographic Murphy sign noted by sonographer. Common bile duct: Diameter: 4.3 mm Liver: No focal lesion identified. Within normal limits in parenchymal echogenicity. IVC: No abnormality visualized. Pancreas: The pancreas is thickened with heterogeneous echotexture probably representing underlying pancreatitis. Spleen: Size and appearance within normal limits. Right Kidney: Length: 11.0 cm. Simple 1.3 cm cyst. No hydronephrosis. Left Kidney: Length: 11.8 cm. Echogenicity within normal limits. No mass or hydronephrosis visualized. Abdominal aorta: No aneurysm visualized. Bifurcation not visualized. Other findings: Abdominal pain with compression over tail of pancreas. IMPRESSION: Heterogeneous and thickened  pancreas with abdominal pain with ultrasound compression over tail of pancreas, likely acute pancreatitis. Otherwise unremarkable abdominal ultrasound. Electronically Signed   By: Mitzi Hansen M.D.   On: 02/10/2017 22:01    Lab Data:  CBC:  Recent Labs Lab 02/10/17 1755 02/11/17 0534  WBC 6.3 7.3  HGB 14.3 11.8*  HCT 41.2 35.4*  MCV 88.4 88.7  PLT 382 270   Basic Metabolic Panel:  Recent Labs Lab 02/10/17 1755 02/11/17 0534  NA 133* 132*  K 3.9 3.2*  CL 96* 98*  CO2 25 23  GLUCOSE 243* 249*  BUN 14 12  CREATININE 1.11* 0.80  CALCIUM 9.4 8.3*  MG  --  1.5*  PHOS  --  3.8   GFR: Estimated Creatinine Clearance: 99.3 mL/min (by C-G formula based on SCr of 0.8 mg/dL). Liver Function Tests:  Recent Labs Lab 02/10/17 1755 02/11/17 0534  AST 46* 28  ALT 25 19  ALKPHOS 76 55  BILITOT 0.5 0.6  PROT 7.4 5.7*  ALBUMIN 3.4* 2.7*    Recent Labs Lab 02/10/17 1755 02/11/17 0734  LIPASE 191* 109*   No results for input(s): AMMONIA in the last 168 hours. Coagulation Profile: No results for input(s): INR, PROTIME in the last 168 hours. Cardiac Enzymes: No results for input(s): CKTOTAL, CKMB, CKMBINDEX, TROPONINI in the last 168 hours. BNP (last 3 results) No results for input(s): PROBNP in the last 8760 hours. HbA1C:  Recent Labs  02/10/17 1632  HGBA1C 8.1   CBG:  Recent Labs Lab 02/10/17 1932 02/11/17 0217 02/11/17 0900 02/11/17 0908  GLUCAP 137* 311* 219* 241*   Lipid Profile:  Recent Labs  02/11/17 0534  CHOL 106  HDL 49  LDLCALC 13  TRIG 218*  CHOLHDL 2.2   Thyroid Function Tests:  Recent Labs  02/11/17 0534  TSH 1.808   Anemia Panel: No results for input(s): VITAMINB12, FOLATE, FERRITIN, TIBC, IRON, RETICCTPCT in the last 72 hours. Urine analysis:  Component Value Date/Time   COLORURINE YELLOW 02/11/2017 0536   APPEARANCEUR HAZY (A) 02/11/2017 0536   LABSPEC 1.013 02/11/2017 0536   PHURINE 5.0 02/11/2017 0536    GLUCOSEU >=500 (A) 02/11/2017 0536   HGBUR NEGATIVE 02/11/2017 0536   BILIRUBINUR NEGATIVE 02/11/2017 0536   BILIRUBINUR negative 11/06/2016 1700   KETONESUR 5 (A) 02/11/2017 0536   PROTEINUR NEGATIVE 02/11/2017 0536   UROBILINOGEN 0.2 11/06/2016 1700   UROBILINOGEN 0.2 12/11/2014 1522   NITRITE NEGATIVE 02/11/2017 0536   LEUKOCYTESUR SMALL (A) 02/11/2017 2542     Ambriella Kitt M.D. [facility] 02/11/2017, 12:15 PM  Pager: [phone] Between 7am to 7pm - call Pager - [phone]  After 7pm go to [email or web address] - password [email or web address]  Call night coverage person covering after 7pm

## 2017-02-11 NOTE — Progress Notes (Signed)
CRITICAL VALUE ALERT  Critical value received: CBG:446  Date of notification: 02/11/17  Time of notification: 12:18  Critical value read back: Yes  Nurse who received alert: Ileene Rubens   MD notified (1st page): Rai  Time of first page: 1219  Responding MD: Isidoro Donning  Time MD responded: 1221  Verbal order to give 15 units of novolog. Orders placed. Will continue to monitor.

## 2017-02-12 LAB — COMPREHENSIVE METABOLIC PANEL
ALBUMIN: 2.5 g/dL — AB (ref 3.5–5.0)
ALK PHOS: 64 U/L (ref 38–126)
ALT: 18 U/L (ref 14–54)
ANION GAP: 8 (ref 5–15)
AST: 23 U/L (ref 15–41)
BILIRUBIN TOTAL: 0.5 mg/dL (ref 0.3–1.2)
BUN: 6 mg/dL (ref 6–20)
CO2: 24 mmol/L (ref 22–32)
Calcium: 8.4 mg/dL — ABNORMAL LOW (ref 8.9–10.3)
Chloride: 106 mmol/L (ref 101–111)
Creatinine, Ser: 0.82 mg/dL (ref 0.44–1.00)
GFR calc non Af Amer: >60 mL/min (ref >60)
GLUCOSE: 273 mg/dL — AB (ref 65–99)
POTASSIUM: 3.7 mmol/L (ref 3.5–5.1)
SODIUM: 138 mmol/L (ref 135–145)
TOTAL PROTEIN: 5.4 g/dL — AB (ref 6.5–8.1)

## 2017-02-12 LAB — BASIC METABOLIC PANEL
Anion gap: 8 (ref 5–15)
BUN: 7 mg/dL (ref 6–20)
CO2: 22 mmol/L (ref 22–32)
Calcium: 8.6 mg/dL — ABNORMAL LOW (ref 8.9–10.3)
Chloride: 101 mmol/L (ref 101–111)
Creatinine, Ser: 1.03 mg/dL — ABNORMAL HIGH (ref 0.44–1.00)
GFR calc Af Amer: >60 mL/min (ref >60)
GFR calc non Af Amer: >60 mL/min (ref >60)
Glucose, Bld: 417 mg/dL — ABNORMAL HIGH (ref 65–99)
Potassium: 4.3 mmol/L (ref 3.5–5.1)
Sodium: 131 mmol/L — ABNORMAL LOW (ref 135–145)

## 2017-02-12 LAB — HEMOGLOBIN A1C
HEMOGLOBIN A1C: 8.2 % — AB (ref 4.8–5.6)
HEMOGLOBIN A1C: 8.3 % — AB (ref 4.8–5.6)
Mean Plasma Glucose: 189
Mean Plasma Glucose: 192

## 2017-02-12 LAB — LIPASE, BLOOD: Lipase: 107 U/L — ABNORMAL HIGH (ref 11–51)

## 2017-02-12 LAB — GLUCOSE, CAPILLARY: GLUCOSE-CAPILLARY: 285 mg/dL — AB (ref 65–99)

## 2017-02-12 MED ORDER — GUAIFENESIN-CODEINE 100-10 MG/5ML PO SOLN
5.0000 mL | Freq: Three times a day (TID) | ORAL | 0 refills | Status: DC | PRN
Start: 2017-02-12 — End: 2017-03-25

## 2017-02-12 MED ORDER — GABAPENTIN 300 MG PO CAPS: 900.0000 mg | ORAL_CAPSULE | Freq: Three times a day (TID) | ORAL | 0 refills | Status: DC

## 2017-02-12 MED ORDER — DOXYCYCLINE HYCLATE 100 MG PO TABS: 100.0000 mg | ORAL_TABLET | Freq: Two times a day (BID) | ORAL | 0 refills | Status: DC

## 2017-02-12 MED ORDER — INSULIN ASPART 100 UNIT/ML ~~LOC~~ SOLN
15.0000 [IU] | Freq: Three times a day (TID) | SUBCUTANEOUS | Status: DC
  Administered 2017-02-12: 15 [IU] via SUBCUTANEOUS

## 2017-02-12 MED ORDER — ALBUTEROL SULFATE HFA 108 (90 BASE) MCG/ACT IN AERS: 2.0000 | INHALATION_SPRAY | RESPIRATORY_TRACT | 1 refills | Status: DC | PRN

## 2017-02-12 MED ORDER — ACETAMINOPHEN-CODEINE 300-60 MG PO TABS: 1.0000 | ORAL_TABLET | Freq: Four times a day (QID) | ORAL | 0 refills | Status: DC | PRN

## 2017-02-12 MED ORDER — IPRATROPIUM-ALBUTEROL 0.5-2.5 (3) MG/3ML IN SOLN: 3.0000 mL | Freq: Two times a day (BID) | RESPIRATORY_TRACT | Status: DC

## 2017-02-12 MED ORDER — INSULIN ASPART 100 UNIT/ML ~~LOC~~ SOLN
10.0000 [IU] | Freq: Once | SUBCUTANEOUS | Status: AC
  Administered 2017-02-12: 10 [IU] via SUBCUTANEOUS

## 2017-02-12 MED FILL — GABAPENTIN 300 MG CAPSULE: 300 | 10 days supply | Qty: 90 | Fill #0

## 2017-02-12 MED FILL — DOXYCYCLINE 100 MG TABLET: 100 | 7 days supply | Qty: 14 | Fill #0

## 2017-02-12 MED FILL — ACETAMINOPHEN/COD #4 TABLET: 300-60 | 3 days supply | Qty: 10 | Fill #0

## 2017-02-12 NOTE — Progress Notes (Signed)
Discharge instructions and mediations discussed with patient.  Prescriptions given to patient.  All questions answered.

## 2017-02-12 NOTE — Discharge Summary (Signed)
Physician Discharge Summary   Patient ID: Carla Hicks MRN: [id number] DOB/AGE: 05/08/1971 46 y.o.  Admit date: 02/10/2017 Discharge date: 02/12/2017  Primary Care Physician:  Lora Paula, MD  Discharge Diagnoses:    . 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 Chronic diastolic CHF (congestive heart failure) (HCC) . Dyslipidemia associated with type 2 diabetes mellitus (HCC) . Essential hypertension, benign . Uncontrolled type 2 diabetes mellitus with complication (HCC) . Alcohol abuse . COPD (chronic obstructive pulmonary disease) (HCC) with acute exacerbation . Tobacco abuse Severe noncompliance  Consults: None  Recommendations for Outpatient Follow-up:  1. Please repeat CBC/BMET at next visit   DIET: Low-fat, modified diet    Allergies:   Allergies  Allergen Reactions  . Penicillins Hives    Has patient had a PCN reaction causing immediate rash, facial/tongue/throat swelling, SOB or lightheadedness with hypotension: Yes Has patient had a PCN reaction causing severe rash involving mucus membranes or skin necrosis: No Has patient had a PCN reaction that required hospitalization: Yes Has patient had a PCN reaction occurring within the last 10 years: No If all of the above answers are "NO", then may proceed with Cephalosporin use.   . Robitussin Liquid Center [Menthol] Hives     DISCHARGE MEDICATIONS: Discharge Medication List as of 02/12/2017 12:10 PM    START taking these medications   Details  doxycycline (VIBRA-TABS) 100 MG tablet Take 1 tablet (100 mg total) by mouth 2 (two) times daily. X 7days, Starting Fri 02/12/2017, Print      CONTINUE these medications which have CHANGED   Details  acetaminophen-codeine (TYLENOL #4) 300-60 MG tablet Take 1 tablet by mouth every 6 (six) hours as needed for pain., Starting Fri 02/12/2017, Print    albuterol (PROVENTIL HFA;VENTOLIN HFA) 108 (90 Base) MCG/ACT inhaler Inhale 2 puffs into the lungs  every 4 (four) hours as needed for wheezing or shortness of breath., Starting Fri 02/12/2017, Print    gabapentin (NEURONTIN) 300 MG capsule Take 3 capsules (900 mg total) by mouth 3 (three) times daily., Starting Fri 02/12/2017, Print    guaiFENesin-codeine 100-10 MG/5ML syrup Take 5 mLs by mouth 3 (three) times daily as needed for cough., Starting Fri 02/12/2017, Print      CONTINUE these medications which have NOT CHANGED   Details  amitriptyline (ELAVIL) 25 MG tablet Take 3 tablets (75 mg total) by mouth at bedtime., Starting Wed 02/10/2017, Normal    atorvastatin (LIPITOR) 20 MG tablet TAKE 1 TABLET BY MOUTH DAILY AT 6 PM, Normal    budesonide-formoterol (SYMBICORT) 80-4.5 MCG/ACT inhaler Take 2 puffs first thing in am and then another 2 puffs about 12 hours later., Print    cloNIDine (CATAPRES) 0.1 MG tablet Take 1 tablet (0.1 mg total) by mouth 2 (two) times daily., Starting 04/21/2016, Until Discontinued, Normal    diclofenac sodium (VOLTAREN) 1 % GEL Apply 1 application topically 4 (four) times daily., Starting 05/30/2015, Until Discontinued, Print    esomeprazole (NEXIUM 24HR) 20 MG capsule Take 20 mg by mouth daily., Historical Med    gemfibrozil (LOPID) 600 MG tablet Take 1 tablet (600 mg total) by mouth 2 (two) times daily before a meal., Starting 04/21/2016, Until Discontinued, Normal    ibuprofen (ADVIL) 200 MG tablet Take 1-2 tablets (200-400 mg total) by mouth every 6 (six) hours as needed for mild pain or moderate pain., Starting 05/01/2016, Until Discontinued, Print    !! insulin aspart (NOVOLOG) 100 UNIT/ML injection Inject 10 U with breakfast and  lunch,  15 U with dinner, Normal    insulin glargine (LANTUS) 100 UNIT/ML injection Inject 0.6 mLs (60 Units total) into the skin at bedtime., Starting Thu 10/01/2016, Normal    ipratropium-albuterol (DUONEB) 0.5-2.5 (3) MG/3ML SOLN Take 3 mLs by nebulization every 6 (six) hours as needed (sob/doe)., Starting Wed 12/02/2016, Normal     metFORMIN (GLUCOPHAGE) 1000 MG tablet Take 1 tablet (1,000 mg total) by mouth 2 (two) times daily with a meal., Starting Thu 10/01/2016, Normal    Multiple Vitamin (MULTIVITAMIN WITH MINERALS) TABS tablet Take 1 tablet by mouth daily., Until Discontinued, Historical Med    pantoprazole (PROTONIX) 40 MG tablet Take 1 tablet (40 mg total) by mouth daily. Take 30-60 min before first meal of the day, Starting 09/18/2015, Until Discontinued, Normal    triamcinolone ointment (KENALOG) 0.5 % Apply 1 application topically 2 (two) times daily., Starting Fri 08/21/2016, Normal    !! insulin aspart (NOVOLOG) 100 UNIT/ML injection Inject 2-15 Units into the skin as needed for high blood sugar. 0-120 2 units,  121-150 3 units, 151-160 4 units, 161- 170 7 units, 171-190 10 units, 191- 200 12 units, 201- 300 15 units, Historical Med     !! - Potential duplicate medications found. Please discuss with provider.    STOP taking these medications     predniSONE (DELTASONE) 20 MG tablet      guaiFENesin (MUCINEX) 600 MG 12 hr tablet          Brief H and P: For complete details please refer to admission H and P, but in briefPatient is a 46 year old female with history of pancreatitis, diabetes, COPD/asthma, alcohol abuse, hyperlipidemia presented with epigastric abdominal pain, started on the morning of admission. [facility] was [facility] to ED to rule out pancreatitis. Patient described pain as in epigastric region radiating to the mid back, with the nausea and vomiting, poor appetite. She also reported for the last one week with productive cough and wheezing. She was seen in ED on 2/14 and was given DuoNeb and Solu-Medrol and was discharged with prednisone. She also reported that she consumed a 40 ounce beer per day before the admission and continue his to smoke cigarettes.  Lipase was 191, patient was admitted for acute pancreatitis.  Hospital Course:  Acute   Pancreatitis:  - Likely alcohol-induced pancreatitis - Abdominal ultrasound showed heterogenous and thickened pancreas, no gallstones, no sonographic Murphy sign. with abdominal pain however rule out any gallstones, - Patient was strongly counseled on alcohol cessation, placed on pain control and IV fluids. - She was placed on clear liquid diet however in the evening [place] and severely noncompliant with her diet. In the morning, she was transitioned to solid diet and she was tolerating solid food without any difficulty or pain. She was ambulating in the hallways without any difficulty. Blood sugars were out of control due to prednisone and her noncompliance.  COPD with acute exacerbation - Currently no significant wheezing, prednisone was discontinued due to uncontrolled hyperglycemia. - Continue albuterol inhaler and doxycycline.     Essential hypertension, benign - Currently stable, continue clonidine    Dyslipidemia associated with type 2 diabetes mellitus (HCC) - Continue Lopid    Chronic diastolic CHF (congestive heart failure) (HCC) -Currently euvolemic, stable     Uncontrolled type 2 diabetes mellitus with complication (HCC) - CBGs in 400-600 during hospitalization., patient was placed on sliding scale insulin, Lantus, meal coverage  -  Hemoglobin A1c 8.3     Alcohol abuse -  place on  CIWA protocol with ativan - Patient counseled on alcohol cessation     Tobacco abuse - Continue nicotine patch   Day of Discharge BP (!) 142/115 (BP Location: Left Arm)   Pulse 94   Temp 98.6 F (37 C)   Resp 18   Ht 5\' 4"  (1.626 m)   Wt 95.6 kg (210 lb 12.2 oz)   LMP 12/10/2016 (Approximate)   SpO2 94%   BMI 36.18 kg/m   Physical Exam: General: Alert and awake oriented x3 not in any acute distress. HEENT: anicteric sclera, pupils reactive to light and accommodation CVS: S1-S2 clear no murmur rubs or gallops Chest: clear to auscultation  bilaterally, no wheezing rales or rhonchi Abdomen:  obese, soft nontender, nondistended, normal bowel sounds Extremities: no cyanosis, clubbing or edema noted bilaterally Neuro: Cranial nerves II-XII intact, no focal neurological deficits   The results of significant diagnostics from this hospitalization (including imaging, microbiology, ancillary and laboratory) are listed below for reference.    LAB RESULTS: Basic Metabolic Panel:  Recent Labs Lab 02/11/17 0534  02/11/17 2354 02/12/17 0725  NA 132*  --  131* 138  K 3.2*  --  4.3 3.7  CL 98*  --  101 106  CO2 23  --  22 24  GLUCOSE 249*  < > 417* 273*  BUN 12  --  7 6  CREATININE 0.80  --  1.03* 0.82  CALCIUM 8.3*  --  8.6* 8.4*  MG 1.5*  --   --   --   PHOS 3.8  --   --   --   < > = values in this interval not displayed. Liver Function Tests:  Recent Labs Lab 02/11/17 0534 02/12/17 0725  AST 28 23  ALT 19 18  ALKPHOS 55 64  BILITOT 0.6 0.5  PROT 5.7* 5.4*  ALBUMIN 2.7* 2.5*    Recent Labs Lab 02/11/17 0734 02/12/17 0725  LIPASE 109* 107*   No results for input(s): AMMONIA in the last 168 hours. CBC:  Recent Labs Lab 02/10/17 1755 02/11/17 0534  WBC 6.3 7.3  HGB 14.3 11.8*  HCT 41.2 35.4*  MCV 88.4 88.7  PLT 382 270   Cardiac Enzymes: No results for input(s): CKTOTAL, CKMB, CKMBINDEX, TROPONINI in the last 168 hours. BNP: Invalid input(s): POCBNP CBG:  Recent Labs Lab 02/11/17 2145 02/12/17 0758  GLUCAP >600* 285*    Significant Diagnostic Studies:  Dg Chest 2 View  Result Date: 02/10/2017 CLINICAL DATA:  Epigastric pain EXAM: CHEST  2 VIEW COMPARISON:  02/03/2017 FINDINGS: The heart size and mediastinal contours are within normal limits. Both lungs are clear. The visualized skeletal structures are unremarkable. IMPRESSION: No active cardiopulmonary disease. Electronically Signed   By: Signa Kell M.D.   On: 02/10/2017 21:23   US Abdomen Complete  Result Date: 02/10/2017 CLINICAL  DATA:  46 y/o  F; epigastric pain.  Elevated lipase. EXAM: ABDOMEN ULTRASOUND COMPLETE COMPARISON:  04/29/2016 abdominal ultrasound. FINDINGS: Gallbladder: No gallstones or wall thickening visualized. No sonographic Murphy sign noted by sonographer. Common bile duct: Diameter: 4.3 mm Liver: No focal lesion identified. Within normal limits in parenchymal echogenicity. IVC: No abnormality visualized. Pancreas: The pancreas is thickened with heterogeneous echotexture probably representing underlying pancreatitis. Spleen: Size and appearance within normal limits. Right Kidney: Length: 11.0 cm. Simple 1.3 cm cyst. No hydronephrosis. Left Kidney: Length: 11.8 cm. Echogenicity within normal limits. No mass or hydronephrosis  visualized. Abdominal aorta: No aneurysm visualized. Bifurcation not visualized. Other findings: Abdominal pain with compression over tail of pancreas. IMPRESSION: Heterogeneous and thickened pancreas with abdominal pain with ultrasound compression over tail of pancreas, likely acute pancreatitis. Otherwise unremarkable abdominal ultrasound. Electronically Signed   By: Mitzi Hansen M.D.   On: 02/10/2017 22:01    2D ECHO:   Disposition and Follow-up: Discharge Instructions    Diet Carb Modified    Complete by:  As directed    Increase activity slowly    Complete by:  As directed        DISPOSITION:Home  DISCHARGE FOLLOW-UP  Follow-up Information    Lora Paula, MD. Schedule an appointment as soon as possible for a visit in 1 week.   Specialty:  Family Medicine Why:  Please contact office on Monday 02/15/2017 to schedule an appointment with Dr, Armen Pickup. Thank you.  [address] [place] [address] [phone]            Time spent on Discharge: 25 mins    Signed:   RAI,RIPUDEEP M.D. [facility] 02/12/2017, 12:36 PM Pager: [phone]

## 2017-02-23 ENCOUNTER — Encounter (HOSPITAL_COMMUNITY): Payer: Self-pay

## 2017-02-23 ENCOUNTER — Observation Stay (HOSPITAL_COMMUNITY)
Admission: EM | Admit: 2017-02-23 | Discharge: 2017-02-24 | Disposition: A | Discharge status: Home / Self Care | Payer: Self-pay | Attending: Internal Medicine | Admitting: Internal Medicine

## 2017-02-23 ENCOUNTER — Inpatient Hospital Stay: Payer: Self-pay | Admitting: Family Medicine

## 2017-02-23 ENCOUNTER — Emergency Department (HOSPITAL_COMMUNITY): Payer: Self-pay

## 2017-02-23 DIAGNOSIS — I10 Essential (primary) hypertension: Secondary | ICD-10-CM

## 2017-02-23 DIAGNOSIS — E785 Hyperlipidemia, unspecified: Secondary | ICD-10-CM | POA: Insufficient documentation

## 2017-02-23 DIAGNOSIS — Z7952 Long term (current) use of systemic steroids: Secondary | ICD-10-CM | POA: Insufficient documentation

## 2017-02-23 DIAGNOSIS — E1110 Type 2 diabetes mellitus with ketoacidosis without coma: Secondary | ICD-10-CM | POA: Diagnosis present

## 2017-02-23 DIAGNOSIS — E118 Type 2 diabetes mellitus with unspecified complications: Secondary | ICD-10-CM

## 2017-02-23 DIAGNOSIS — I110 Hypertensive heart disease with heart failure: Secondary | ICD-10-CM | POA: Insufficient documentation

## 2017-02-23 DIAGNOSIS — K860 Alcohol-induced chronic pancreatitis: Secondary | ICD-10-CM | POA: Insufficient documentation

## 2017-02-23 DIAGNOSIS — Z7951 Long term (current) use of inhaled steroids: Secondary | ICD-10-CM | POA: Insufficient documentation

## 2017-02-23 DIAGNOSIS — Z6836 Body mass index (BMI) 36.0-36.9, adult: Secondary | ICD-10-CM | POA: Insufficient documentation

## 2017-02-23 DIAGNOSIS — Z66 Do not resuscitate: Secondary | ICD-10-CM | POA: Insufficient documentation

## 2017-02-23 DIAGNOSIS — Z880 Allergy status to penicillin: Secondary | ICD-10-CM | POA: Insufficient documentation

## 2017-02-23 DIAGNOSIS — E1165 Type 2 diabetes mellitus with hyperglycemia: Secondary | ICD-10-CM | POA: Insufficient documentation

## 2017-02-23 DIAGNOSIS — F1011 Alcohol abuse, in remission: Secondary | ICD-10-CM | POA: Insufficient documentation

## 2017-02-23 DIAGNOSIS — E1140 Type 2 diabetes mellitus with diabetic neuropathy, unspecified: Secondary | ICD-10-CM | POA: Insufficient documentation

## 2017-02-23 DIAGNOSIS — Z833 Family history of diabetes mellitus: Secondary | ICD-10-CM | POA: Insufficient documentation

## 2017-02-23 DIAGNOSIS — I4510 Unspecified right bundle-branch block: Secondary | ICD-10-CM | POA: Insufficient documentation

## 2017-02-23 DIAGNOSIS — R7989 Other specified abnormal findings of blood chemistry: Secondary | ICD-10-CM

## 2017-02-23 DIAGNOSIS — J9601 Acute respiratory failure with hypoxia: Secondary | ICD-10-CM | POA: Insufficient documentation

## 2017-02-23 DIAGNOSIS — J45901 Unspecified asthma with (acute) exacerbation: Secondary | ICD-10-CM

## 2017-02-23 DIAGNOSIS — IMO0002 Reserved for concepts with insufficient information to code with codable children: Secondary | ICD-10-CM | POA: Diagnosis present

## 2017-02-23 DIAGNOSIS — R0902 Hypoxemia: Secondary | ICD-10-CM

## 2017-02-23 DIAGNOSIS — R778 Other specified abnormalities of plasma proteins: Secondary | ICD-10-CM

## 2017-02-23 DIAGNOSIS — F17210 Nicotine dependence, cigarettes, uncomplicated: Secondary | ICD-10-CM | POA: Insufficient documentation

## 2017-02-23 DIAGNOSIS — I5032 Chronic diastolic (congestive) heart failure: Secondary | ICD-10-CM | POA: Insufficient documentation

## 2017-02-23 DIAGNOSIS — R748 Abnormal levels of other serum enzymes: Secondary | ICD-10-CM

## 2017-02-23 DIAGNOSIS — J441 Chronic obstructive pulmonary disease with (acute) exacerbation: Principal | ICD-10-CM | POA: Insufficient documentation

## 2017-02-23 DIAGNOSIS — Z794 Long term (current) use of insulin: Secondary | ICD-10-CM | POA: Insufficient documentation

## 2017-02-23 DIAGNOSIS — I272 Pulmonary hypertension, unspecified: Secondary | ICD-10-CM | POA: Insufficient documentation

## 2017-02-23 LAB — CBC WITH DIFFERENTIAL/PLATELET
Basophils Absolute: 0.1 10*3/uL (ref 0.0–0.1)
Basophils Relative: 1 %
Eosinophils Absolute: 0.7 10*3/uL (ref 0.0–0.7)
Eosinophils Relative: 10 %
HEMATOCRIT: 34.4 % — AB (ref 36.0–46.0)
Hemoglobin: 11.4 g/dL — ABNORMAL LOW (ref 12.0–15.0)
LYMPHS PCT: 35 %
Lymphs Abs: 2.5 10*3/uL (ref 0.7–4.0)
MCH: 29.8 pg (ref 26.0–34.0)
MCHC: 33.1 g/dL (ref 30.0–36.0)
MCV: 90.1 fL (ref 78.0–100.0)
MONO ABS: 0.4 10*3/uL (ref 0.1–1.0)
Monocytes Relative: 6 %
NEUTROS ABS: 3.3 10*3/uL (ref 1.7–7.7)
Neutrophils Relative %: 48 %
Platelets: 221 10*3/uL (ref 150–400)
RBC: 3.82 MIL/uL — ABNORMAL LOW (ref 3.87–5.11)
RDW: 12.6 % (ref 11.5–15.5)
WBC: 7.1 10*3/uL (ref 4.0–10.5)

## 2017-02-23 LAB — COMPREHENSIVE METABOLIC PANEL
ALBUMIN: 2.8 g/dL — AB (ref 3.5–5.0)
ALK PHOS: 68 U/L (ref 38–126)
ALT: 12 U/L — ABNORMAL LOW (ref 14–54)
ANION GAP: 10 (ref 5–15)
AST: 15 U/L (ref 15–41)
BILIRUBIN TOTAL: 0.8 mg/dL (ref 0.3–1.2)
CALCIUM: 8.5 mg/dL — AB (ref 8.9–10.3)
CO2: 23 mmol/L (ref 22–32)
Chloride: 100 mmol/L — ABNORMAL LOW (ref 101–111)
Creatinine, Ser: 0.7 mg/dL (ref 0.44–1.00)
GFR calc Af Amer: >60 mL/min (ref >60)
GFR calc non Af Amer: >60 mL/min (ref >60)
GLUCOSE: 411 mg/dL — AB (ref 65–99)
POTASSIUM: 3.5 mmol/L (ref 3.5–5.1)
SODIUM: 133 mmol/L — AB (ref 135–145)
TOTAL PROTEIN: 5.9 g/dL — AB (ref 6.5–8.1)

## 2017-02-23 LAB — GLUCOSE, CAPILLARY
GLUCOSE-CAPILLARY: 556 mg/dL — AB (ref 65–99)
GLUCOSE-CAPILLARY: 579 mg/dL — AB (ref 65–99)
GLUCOSE-CAPILLARY: 598 mg/dL — AB (ref 65–99)

## 2017-02-23 LAB — CBC
HEMATOCRIT: 37.6 % (ref 36.0–46.0)
HEMOGLOBIN: 12.8 g/dL (ref 12.0–15.0)
MCH: 30.8 pg (ref 26.0–34.0)
MCHC: 34 g/dL (ref 30.0–36.0)
MCV: 90.6 fL (ref 78.0–100.0)
Platelets: 223 10*3/uL (ref 150–400)
RBC: 4.15 MIL/uL (ref 3.87–5.11)
RDW: 12.6 % (ref 11.5–15.5)
WBC: 7.1 10*3/uL (ref 4.0–10.5)

## 2017-02-23 LAB — TROPONIN I
TROPONIN I: 0.09 ng/mL — AB (ref <0.03)
TROPONIN I: 0.05 ng/mL — AB (ref <0.03)
Troponin I: 0.05 ng/mL (ref <0.03)

## 2017-02-23 LAB — CREATININE, SERUM: CREATININE: 0.92 mg/dL (ref 0.44–1.00)

## 2017-02-23 LAB — BRAIN NATRIURETIC PEPTIDE: B Natriuretic Peptide: 71 pg/mL (ref 0.0–100.0)

## 2017-02-23 MED ORDER — ALBUTEROL (5 MG/ML) CONTINUOUS INHALATION SOLN
10.0000 mg/h | INHALATION_SOLUTION | Freq: Once | RESPIRATORY_TRACT | Status: DC
  Filled 2017-02-23: qty 20

## 2017-02-23 MED ORDER — AZITHROMYCIN 500 MG PO TABS
500.0000 mg | ORAL_TABLET | Freq: Every day | ORAL | Status: AC
  Administered 2017-02-23: 500 mg via ORAL
  Filled 2017-02-23: qty 1

## 2017-02-23 MED ORDER — ACETAMINOPHEN 325 MG PO TABS: 650.0000 mg | ORAL_TABLET | Freq: Four times a day (QID) | ORAL | Status: DC | PRN

## 2017-02-23 MED ORDER — METHYLPREDNISOLONE SODIUM SUCC 125 MG IJ SOLR
60.0000 mg | Freq: Two times a day (BID) | INTRAMUSCULAR | Status: DC
  Administered 2017-02-23 – 2017-02-24 (×2): 60 mg via INTRAVENOUS
  Filled 2017-02-23 (×2): qty 2

## 2017-02-23 MED ORDER — INSULIN GLARGINE 100 UNIT/ML ~~LOC~~ SOLN
60.0000 [IU] | Freq: Every day | SUBCUTANEOUS | Status: DC
  Administered 2017-02-23: 60 [IU] via SUBCUTANEOUS
  Filled 2017-02-23 (×2): qty 0.6

## 2017-02-23 MED ORDER — ATORVASTATIN CALCIUM 20 MG PO TABS
20.0000 mg | ORAL_TABLET | Freq: Every day | ORAL | Status: DC
  Administered 2017-02-23: 20 mg via ORAL
  Filled 2017-02-23: qty 1

## 2017-02-23 MED ORDER — METHYLPREDNISOLONE SODIUM SUCC 125 MG IJ SOLR
125.0000 mg | Freq: Once | INTRAMUSCULAR | Status: DC
  Filled 2017-02-23: qty 2

## 2017-02-23 MED ORDER — ALBUTEROL SULFATE (2.5 MG/3ML) 0.083% IN NEBU
2.5000 mg | INHALATION_SOLUTION | RESPIRATORY_TRACT | Status: DC
  Administered 2017-02-23 (×2): 2.5 mg via RESPIRATORY_TRACT
  Filled 2017-02-23 (×2): qty 3

## 2017-02-23 MED ORDER — AMITRIPTYLINE HCL 75 MG PO TABS
75.0000 mg | ORAL_TABLET | Freq: Every day | ORAL | Status: DC
  Administered 2017-02-23: 75 mg via ORAL
  Filled 2017-02-23: qty 1

## 2017-02-23 MED ORDER — INSULIN ASPART 100 UNIT/ML ~~LOC~~ SOLN
0.0000 [IU] | Freq: Three times a day (TID) | SUBCUTANEOUS | Status: DC
  Administered 2017-02-23: 9 [IU] via SUBCUTANEOUS

## 2017-02-23 MED ORDER — POLYETHYLENE GLYCOL 3350 17 G PO PACK: 17.0000 g | PACK | Freq: Every day | ORAL | Status: DC | PRN

## 2017-02-23 MED ORDER — GABAPENTIN 300 MG PO CAPS
900.0000 mg | ORAL_CAPSULE | Freq: Three times a day (TID) | ORAL | Status: DC
  Administered 2017-02-23 – 2017-02-24 (×3): 900 mg via ORAL
  Filled 2017-02-23 (×3): qty 3

## 2017-02-23 MED ORDER — KETOROLAC TROMETHAMINE 30 MG/ML IJ SOLN
15.0000 mg | Freq: Once | INTRAMUSCULAR | Status: AC
  Administered 2017-02-23: 15 mg via INTRAVENOUS
  Filled 2017-02-23: qty 1

## 2017-02-23 MED ORDER — IPRATROPIUM-ALBUTEROL 0.5-2.5 (3) MG/3ML IN SOLN: 3.0000 mL | RESPIRATORY_TRACT | Status: DC | PRN

## 2017-02-23 MED ORDER — ACETAMINOPHEN 650 MG RE SUPP: 650.0000 mg | Freq: Four times a day (QID) | RECTAL | Status: DC | PRN

## 2017-02-23 MED ORDER — ALBUTEROL SULFATE (2.5 MG/3ML) 0.083% IN NEBU
10.0000 mg | INHALATION_SOLUTION | Freq: Once | RESPIRATORY_TRACT | Status: AC
  Administered 2017-02-23: 10 mg via RESPIRATORY_TRACT

## 2017-02-23 MED ORDER — OMEGA-3-ACID ETHYL ESTERS 1 G PO CAPS
1.0000 g | ORAL_CAPSULE | Freq: Two times a day (BID) | ORAL | Status: DC
  Administered 2017-02-23 – 2017-02-24 (×2): 1 g via ORAL
  Filled 2017-02-23 (×2): qty 1

## 2017-02-23 MED ORDER — ONDANSETRON HCL 4 MG/2ML IJ SOLN: 4.0000 mg | Freq: Four times a day (QID) | INTRAMUSCULAR | Status: DC | PRN

## 2017-02-23 MED ORDER — IPRATROPIUM-ALBUTEROL 0.5-2.5 (3) MG/3ML IN SOLN
3.0000 mL | RESPIRATORY_TRACT | Status: DC
  Administered 2017-02-23 (×3): 3 mL via RESPIRATORY_TRACT
  Filled 2017-02-23 (×3): qty 3

## 2017-02-23 MED ORDER — GEMFIBROZIL 600 MG PO TABS
600.0000 mg | ORAL_TABLET | Freq: Two times a day (BID) | ORAL | Status: DC
  Administered 2017-02-23 – 2017-02-24 (×2): 600 mg via ORAL
  Filled 2017-02-23 (×3): qty 1

## 2017-02-23 MED ORDER — INSULIN ASPART 100 UNIT/ML ~~LOC~~ SOLN
10.0000 [IU] | Freq: Three times a day (TID) | SUBCUTANEOUS | Status: DC
  Administered 2017-02-23: 15 [IU] via SUBCUTANEOUS

## 2017-02-23 MED ORDER — SODIUM CHLORIDE 0.9% FLUSH
3.0000 mL | Freq: Two times a day (BID) | INTRAVENOUS | Status: DC
  Administered 2017-02-23 – 2017-02-24 (×2): 3 mL via INTRAVENOUS

## 2017-02-23 MED ORDER — CLONIDINE HCL 0.1 MG PO TABS
0.1000 mg | ORAL_TABLET | Freq: Two times a day (BID) | ORAL | Status: DC
  Administered 2017-02-23 – 2017-02-24 (×2): 0.1 mg via ORAL
  Filled 2017-02-23 (×2): qty 1

## 2017-02-23 MED ORDER — HYDROCODONE-ACETAMINOPHEN 5-325 MG PO TABS
1.0000 | ORAL_TABLET | ORAL | Status: DC | PRN
  Administered 2017-02-23 – 2017-02-24 (×3): 2 via ORAL
  Filled 2017-02-23 (×3): qty 2

## 2017-02-23 MED ORDER — ONDANSETRON HCL 4 MG PO TABS: 4.0000 mg | ORAL_TABLET | Freq: Four times a day (QID) | ORAL | Status: DC | PRN

## 2017-02-23 MED ORDER — HEPARIN SODIUM (PORCINE) 5000 UNIT/ML IJ SOLN
5000.0000 [IU] | Freq: Three times a day (TID) | INTRAMUSCULAR | Status: DC
  Administered 2017-02-23 – 2017-02-24 (×2): 5000 [IU] via SUBCUTANEOUS
  Filled 2017-02-23 (×2): qty 1

## 2017-02-23 MED ORDER — AZITHROMYCIN 250 MG PO TABS
250.0000 mg | ORAL_TABLET | Freq: Every day | ORAL | Status: DC
  Administered 2017-02-24: 250 mg via ORAL
  Filled 2017-02-23: qty 1

## 2017-02-23 NOTE — ED Notes (Signed)
Pt. Transported to unit with significant other and belongings at this time. Pt. Made aware of belongings policy and encouraged to send home belongings with family.

## 2017-02-23 NOTE — ED Triage Notes (Signed)
Pt. Coming from home via [facility] for respiratory distress. Pt. Hx of pneumonia and asthma. Pt. Noted to be 89% on room air when fire arrived. Pt. Placed on neb tx. With no improvement to oxygen saturation. Pt. Given 10mg  albuterol, 0.5mg  atrovent, 2G mag, and 125 solumendrol. Pt. Unlabored breathing when arrived to ED. Pt. Speaking in full sentences. EDP at bedside. Pt. Placed on 4L via nasal cannula with oxygen saturation 90%.

## 2017-02-23 NOTE — Progress Notes (Signed)
Called kitchen 3x for patients dinner tray to this time no tray came. Will endorsed to incoming RN to checked CBG and give Insulin as ordered after patient eats.

## 2017-02-23 NOTE — H&P (Signed)
History and Physical    Carla Hicks ZOX:[id number] DOB: 01/26/1971 DOA: 02/23/2017  PCP: Lora Paula, MD  Patient coming from: home  Chief Complaint: abrupt onset SOB   HPI: Carla Hicks is a 46 y.o. female with medical history significant COPD with ongoing tobacco abuse, chronic alcohol-induced pncreatitis, diabetes with diabetic neuropathy, systemic hypertension, diastolic CHF and pulmonary hypertension Patient presented to the emergency department via EMS that was summoned by her family due to respiratory distress with increased work of breathing earlier this morning. She reported that during the last few days she had to use her nebulizer more frequently  At times even every 1-2 hours without improvement and believes that her exacerbation is d/t stopping oral steroid therapy by her PCP Patient was given nebulizerser treatment en route and  somewhat improved, but on arrival her oxygen saturation was initially 89% on ambient air and continued to drop  ED Course: Her vital signs in the ED she was tachycardic with heart rate of 107, tachypneic with respirations of 28/m, was within normal limits, but SpO2 dropped to 85% on room air. Patient was in obvious respiratory distress with increased work of breathing, despite continuous nebulizer therapy she required placement on BiPAP in the emergency department. Blood work in the ED showed hemoglobin of 11.4, normal white blood cells count-7100, given 133, mildly elevated troponin 0.05, elevated glucose of 411 EKG showed sinus rhythm with right bundle branch block, no new abnormalities Chest Xray was negative for acute abnormality  Review of Systems: As per HPI otherwise 10 point review of systems negative.   Ambulatory Status:  Past Medical History:  Diagnosis Date  . Asthma   . COPD (chronic obstructive pulmonary disease) (HCC)   . Diabetes mellitus without complication (HCC)   . Emphysema lung (HCC)     Past Surgical History:   Procedure Laterality Date  . CESAREAN SECTION      Social History   Social History  . Marital status: Legally Separated    Spouse name: N/A  . Number of children: N/A  . Years of education: N/A   Occupational History  . Lab tech    Social History Main Topics  . Smoking status: Current Every Day Smoker    Packs/day: 0.50    Years: 25.00    Types: Cigarettes  . Smokeless tobacco: Never Used  . Alcohol use 0.0 oz/week     Comment: a 40oz or a 12 pack of beer per day 02/10/17  . Drug use: No  . Sexual activity: Yes    Birth control/ protection: None   Other Topics Concern  . Not on file   Social History Narrative  . No narrative on file    Allergies  Allergen Reactions  . Penicillins Hives    Has patient had a PCN reaction causing immediate rash, facial/tongue/throat swelling, SOB or lightheadedness with hypotension: Yes Has patient had a PCN reaction causing severe rash involving mucus membranes or skin necrosis: No Has patient had a PCN reaction that required hospitalization: Yes Has patient had a PCN reaction occurring within the last 10 years: No If all of the above answers are "NO", then may proceed with Cephalosporin use.   . Robitussin Liquid Center [Menthol] Hives    Family History  Problem Relation Age of Onset  . Emphysema Mother     smoked  . Allergies Mother   . Asthma Mother   . Breast cancer Mother   . Uterine cancer Mother   .  Diabetes Father   . Heart disease Maternal Grandmother     Prior to Admission medications   Medication Sig Start Date End Date Taking? Authorizing Provider  acetaminophen-codeine (TYLENOL #4) 300-60 MG tablet Take 1 tablet by mouth every 6 (six) hours as needed for pain. 02/12/17  Yes Ripudeep Jenna Luo, MD  albuterol (PROVENTIL HFA;VENTOLIN HFA) 108 (90 Base) MCG/ACT inhaler Inhale 2 puffs into the lungs every 4 (four) hours as needed for wheezing or shortness of breath. 02/12/17  Yes Ripudeep Jenna Luo, MD  amitriptyline  (ELAVIL) 25 MG tablet Take 3 tablets (75 mg total) by mouth at bedtime. 02/10/17  Yes Josalyn Funches, MD  atorvastatin (LIPITOR) 20 MG tablet TAKE 1 TABLET BY MOUTH DAILY AT 6 PM 08/12/16  Yes Josalyn Funches, MD  budesonide-formoterol (SYMBICORT) 80-4.5 MCG/ACT inhaler Take 2 puffs first thing in am and then another 2 puffs about 12 hours later. 12/02/16  Yes Pete Glatter, MD  cloNIDine (CATAPRES) 0.1 MG tablet Take 1 tablet (0.1 mg total) by mouth 2 (two) times daily. 04/21/16  Yes Rhetta Mura, MD  gabapentin (NEURONTIN) 300 MG capsule Take 3 capsules (900 mg total) by mouth 3 (three) times daily. 02/12/17  Yes Ripudeep Jenna Luo, MD  gemfibrozil (LOPID) 600 MG tablet Take 1 tablet (600 mg total) by mouth 2 (two) times daily before a meal. 04/21/16  Yes Rhetta Mura, MD  guaiFENesin-codeine 100-10 MG/5ML syrup Take 5 mLs by mouth 3 (three) times daily as needed for cough. 02/12/17  Yes Ripudeep Jenna Luo, MD  ibuprofen (ADVIL) 200 MG tablet Take 1-2 tablets (200-400 mg total) by mouth every 6 (six) hours as needed for mild pain or moderate pain. Patient taking differently: Take 800 mg by mouth every 6 (six) hours as needed for mild pain or moderate pain.  05/01/16  Yes Lonia Blood, MD  insulin aspart (NOVOLOG) 100 UNIT/ML injection Inject 10 U with breakfast and  lunch, 15 U with dinner Patient taking differently: Inject 10-15 Units into the skin 3 (three) times daily with meals. 10 units with breakfast then 10 units with lunch then 15 units with dinner (evening meal) 10/01/16  Yes Josalyn Funches, MD  insulin glargine (LANTUS) 100 UNIT/ML injection Inject 0.6 mLs (60 Units total) into the skin at bedtime. 10/01/16  Yes Josalyn Funches, MD  ipratropium-albuterol (DUONEB) 0.5-2.5 (3) MG/3ML SOLN Take 3 mLs by nebulization every 6 (six) hours as needed (sob/doe). 12/02/16  Yes Pete Glatter, MD  metFORMIN (GLUCOPHAGE) 1000 MG tablet Take 1 tablet (1,000 mg total) by mouth 2 (two) times daily  with a meal. 10/01/16  Yes Josalyn Funches, MD  Multiple Vitamin (MULTIVITAMIN WITH MINERALS) TABS tablet Take 1 tablet by mouth daily.   Yes Historical Provider, MD  predniSONE (DELTASONE) 20 MG tablet Take 60 mg by mouth daily with breakfast.   Yes Historical Provider, MD  diclofenac sodium (VOLTAREN) 1 % GEL Apply 1 application topically 4 (four) times daily. Patient not taking: Reported on 02/23/2017 05/30/15   Hayden Rasmussen, NP  doxycycline (VIBRA-TABS) 100 MG tablet Take 1 tablet (100 mg total) by mouth 2 (two) times daily. X 7days Patient not taking: Reported on 02/23/2017 02/12/17   Ripudeep Jenna Luo, MD  pantoprazole (PROTONIX) 40 MG tablet Take 1 tablet (40 mg total) by mouth daily. Take 30-60 min before first meal of the day Patient not taking: Reported on 02/23/2017 09/18/15   Vivianne Master, PA-C  triamcinolone ointment (KENALOG) 0.5 % Apply 1 application topically 2 (  two) times daily. Patient not taking: Reported on 02/23/2017 08/21/16   Dessa Phi, MD    Physical Exam: Vitals:   02/23/17 1230 02/23/17 1300 02/23/17 1330 02/23/17 1400  BP: (!) 142/102 (!) 149/102 146/97 137/92  Pulse: 106 104 102 105  Resp: 24 24 26  (!) 30  Temp:      TempSrc:      SpO2: (!) 89% 90% (!) 87% (!) 89%  Weight:      Height:         General: Appears calm and comfortable Eyes: PERRLA, EOMI, normal lids, iris ENT:  grossly normal hearing, lips & tongue, mucous membranes moist and intact Neck: no lymphoadenopathy, masses or thyromegaly Cardiovascular: RRR, no m/r/g. No JVD, carotid bruits. No LE edema.  Respiratory: bilateral faint wheezes,no rales, rhonchi, basilar scattered cracles. Normal respiratory effort. No accessory muscle use observed Abdomen: soft, non-tender, non-distended, no organomegaly or masses appreciated. BS present in all quadrants Skin: no rash, ulcers or induration seen on limited exam Musculoskeletal: grossly normal tone BUE/BLE, good ROM, no bony abnormality or joint deformities  observed Psychiatric: grossly normal mood and affect, speech fluent and appropriate, alert and oriented x3 Neurologic: CN II-XII grossly intact, moves all extremities in coordinated fashion, sensation intact  Labs on Admission: I have personally reviewed following labs and imaging studies  CBC, BMP  GFR: Estimated Creatinine Clearance: 99.4 mL/min (by C-G formula based on SCr of 0.7 mg/dL).   Creatinine Clearance: Estimated Creatinine Clearance: 99.4 mL/min (by C-G formula based on SCr of 0.7 mg/dL).    Radiological Exams on Admission: Dg Chest Port 1 View  Result Date: 02/23/2017 CLINICAL DATA:  Respiratory distress for 2 days, worsened today. EXAM: PORTABLE CHEST 1 VIEW COMPARISON:  PA and lateral chest 02/10/2017 and 02/03/2017. CT chest 05/07/2016. FINDINGS: The lungs are clear. Heart size is upper normal. No pneumothorax pleural effusion. No acute bony abnormality. IMPRESSION: No acute disease. Electronically Signed   By: Drusilla Kanner M.D.   On: 02/23/2017 10:47    EKG: Independently reviewed - sinus rhythm. No new acute changes Assessment/Plan Principal Problem:   Acute respiratory failure with hypoxia (HCC) Active Problems:   Dyslipidemia associated with type 2 diabetes mellitus (HCC)   Chronic diastolic CHF (congestive heart failure) (HCC)   Uncontrolled type 2 diabetes mellitus with complication (HCC)   Diabetic neuropathy associated with type 2 diabetes mellitus (HCC)   COPD exacerbation (HCC)   HTN (hypertension)   Acute respiratory failure most likely d/t COPD exacerbation Continue nebulizer therapy, antibiotic, IV steroid Might consider transition to oral steroid at discharge if no contraindication   Mildly elevated troponin - probably associated with tachycardia  Patient has no c/o chest pain, EKG did not demonstrate acute changes Will continue to monitor on telemetry and cycle cardiac enzymes Last Echo in 2017 with EF 60-65%, no WMA, mild-moderate LVH    DM type II - uncontrolled most recent HgbA1C is  8.3% on 02/11/17 Hold Metformin while hospitalized, continue basal and meal time coverage home doses Continue diabetic diet, monitor FSBS QACHS, add sliding scale insulin  Hyperlipidemia - most recent lipid profile on 02/11/17 demonstrated normal cholesterol except elevated TGL to 218 Continue statin therapy and add Lovaza  Need follow up of fasting lipids and LFT in two months with her PCP  Hypertension - currently stable Continue home medication and adjust the doses if needed depending on the BP readings   DVT prophylaxis: Heparin Code Status:  DNR Family Communication: none Disposition Plan: Med Surg  Consults called: none Admission status: observation   Raymon Mutton, [place] Pager: [phone] [facility]  If 7PM-7AM, please contact night-coverage [email or web address] Password [facility]  02/23/2017, 2:39 PM

## 2017-02-23 NOTE — ED Notes (Signed)
RN noted patient to have oxygen saturation 93%. Upon entering room, patient took nasal cannula off. RN placed patient back on oxygen. Oxygen increased to 90%.

## 2017-02-23 NOTE — Progress Notes (Signed)
Patient states she will call when ready to be placed on. CPAP set up and ready at bedside.

## 2017-02-23 NOTE — Progress Notes (Addendum)
Patients blood glucose- 556, was eating a huge plate of chinese food. Gave insulin as ordered, notified MD. Will continue to monitor.

## 2017-02-23 NOTE — ED Notes (Signed)
Admitting provider at bedside.

## 2017-02-23 NOTE — ED Provider Notes (Signed)
[facility]-EMERGENCY DEPT Provider Note   CSN: [id number] Arrival date & time: 02/23/17  1008     History   Chief Complaint Chief Complaint  Patient presents with  . Respiratory Distress    HPI Carla Hicks is a 46 y.o. female.  HPI  Patient presents in respiratory distress, EMS providers. Patient is in extremis, level V caveat to initial acuity of condition. EMS reports that the family called EMS due to patient's increased work of breathing. On arrival the patient was hypoxic, with oxygen saturation in the upper 70% range. Patient did not complain of chest pain, did complain of associated lightheadedness. En route the patient received albuterol, continuous positive airway pressure ventilatory support. Patient reportedly did have some improvement. On my initial evaluation the patient nods her head, does not provide verbal responses.   Past Medical History:  Diagnosis Date  . Asthma   . COPD (chronic obstructive pulmonary disease) (HCC)   . Diabetes mellitus without complication (HCC)   . Emphysema lung [facility])     Patient Active Problem List   Diagnosis Date Noted  . Alcohol abuse 02/10/2017  . COPD (chronic obstructive pulmonary disease) (HCC) 02/10/2017  . Tobacco abuse 02/10/2017  . Pancreatitis 02/10/2017  . Diabetic neuropathy associated with type 2 diabetes mellitus (HCC) 10/01/2016  . Scar of lower leg 08/25/2016  . Calf pain 08/25/2016  . Dysuria 08/25/2016  . Alcohol abuse, in remission 05/09/2016  . Chronic diastolic CHF (congestive heart failure) (HCC) 04/30/2016  . Alcohol-induced chronic pancreatitis (HCC)   . Uncontrolled type 2 diabetes mellitus with complication (HCC)   . Pulmonary hypertension   . Nausea & vomiting 04/29/2016  . Severe obesity (BMI >= 40) (HCC) 12/28/2015  . Dyslipidemia associated with type 2 diabetes mellitus (HCC) 12/16/2015  . Cough variant asthma vs UACS  07/18/2015  . Essential hypertension, benign 10/24/2014  .  Alpha-1-antitrypsin deficiency reported by pt but ruled out 08/01/15 with MM phenotype 10/24/2014    Past Surgical History:  Procedure Laterality Date  . CESAREAN SECTION      OB History    Gravida Para Term Preterm AB Living   5 4 4     4    SAB TAB Ectopic Multiple Live Births                   Home Medications    Prior to Admission medications   Medication Sig Start Date End Date Taking? Authorizing Provider  acetaminophen-codeine (TYLENOL #4) 300-60 MG tablet Take 1 tablet by mouth every 6 (six) hours as needed for pain. 02/12/17  Yes Ripudeep Jenna Luo, MD  albuterol (PROVENTIL HFA;VENTOLIN HFA) 108 (90 Base) MCG/ACT inhaler Inhale 2 puffs into the lungs every 4 (four) hours as needed for wheezing or shortness of breath. 02/12/17  Yes Ripudeep Jenna Luo, MD  amitriptyline (ELAVIL) 25 MG tablet Take 3 tablets (75 mg total) by mouth at bedtime. 02/10/17   Josalyn Funches, MD  atorvastatin (LIPITOR) 20 MG tablet TAKE 1 TABLET BY MOUTH DAILY AT 6 PM 08/12/16   Josalyn Funches, MD  budesonide-formoterol (SYMBICORT) 80-4.5 MCG/ACT inhaler Take 2 puffs first thing in am and then another 2 puffs about 12 hours later. 12/02/16   Pete Glatter, MD  cloNIDine (CATAPRES) 0.1 MG tablet Take 1 tablet (0.1 mg total) by mouth 2 (two) times daily. 04/21/16   Rhetta Mura, MD  diclofenac sodium (VOLTAREN) 1 % GEL Apply 1 application topically 4 (four) times daily. Patient taking differently: Apply 1 application  topically 4 (four) times daily as needed (pain.).  05/30/15   Hayden Rasmussen, NP  doxycycline (VIBRA-TABS) 100 MG tablet Take 1 tablet (100 mg total) by mouth 2 (two) times daily. X 7days 02/12/17   Ripudeep Jenna Luo, MD  esomeprazole (NEXIUM 24HR) 20 MG capsule Take 20 mg by mouth daily.    Historical Provider, MD  gabapentin (NEURONTIN) 300 MG capsule Take 3 capsules (900 mg total) by mouth 3 (three) times daily. 02/12/17   Ripudeep Jenna Luo, MD  gemfibrozil (LOPID) 600 MG tablet Take 1 tablet (600 mg total)  by mouth 2 (two) times daily before a meal. 04/21/16   Rhetta Mura, MD  guaiFENesin-codeine 100-10 MG/5ML syrup Take 5 mLs by mouth 3 (three) times daily as needed for cough. 02/12/17   Ripudeep Jenna Luo, MD  ibuprofen (ADVIL) 200 MG tablet Take 1-2 tablets (200-400 mg total) by mouth every 6 (six) hours as needed for mild pain or moderate pain. Patient taking differently: Take 800 mg by mouth every 6 (six) hours as needed for mild pain or moderate pain.  05/01/16   Lonia Blood, MD  insulin aspart (NOVOLOG) 100 UNIT/ML injection Inject 10 U with breakfast and  lunch, 15 U with dinner Patient taking differently: Inject 10-15 Units into the skin 3 (three) times daily with meals. 10 units with breakfast then 10 units with lunch then 15 units with dinner (evening meal) 10/01/16   Josalyn Funches, MD  insulin aspart (NOVOLOG) 100 UNIT/ML injection Inject 2-15 Units into the skin as needed for high blood sugar. 0-120 2 units,  121-150 3 units, 151-160 4 units, 161- 170 7 units, 171-190 10 units, 191- 200 12 units, 201- 300 15 units    Historical Provider, MD  insulin glargine (LANTUS) 100 UNIT/ML injection Inject 0.6 mLs (60 Units total) into the skin at bedtime. 10/01/16   Josalyn Funches, MD  ipratropium-albuterol (DUONEB) 0.5-2.5 (3) MG/3ML SOLN Take 3 mLs by nebulization every 6 (six) hours as needed (sob/doe). 12/02/16   Pete Glatter, MD  metFORMIN (GLUCOPHAGE) 1000 MG tablet Take 1 tablet (1,000 mg total) by mouth 2 (two) times daily with a meal. 10/01/16   Dessa Phi, MD  Multiple Vitamin (MULTIVITAMIN WITH MINERALS) TABS tablet Take 1 tablet by mouth daily.    Historical Provider, MD  pantoprazole (PROTONIX) 40 MG tablet Take 1 tablet (40 mg total) by mouth daily. Take 30-60 min before first meal of the day 09/18/15   Vivianne Master, PA-C  triamcinolone ointment (KENALOG) 0.5 % Apply 1 application topically 2 (two) times daily. 08/21/16   Dessa Phi, MD    Family History Family  History  Problem Relation Age of Onset  . Emphysema Mother     smoked  . Allergies Mother   . Asthma Mother   . Breast cancer Mother   . Uterine cancer Mother   . Diabetes Father   . Heart disease Maternal Grandmother     Social History Social History  Substance Use Topics  . Smoking status: Current Every Day Smoker    Packs/day: 0.50    Years: 25.00    Types: Cigarettes  . Smokeless tobacco: Never Used  . Alcohol use 0.0 oz/week     Comment: a 40oz or a 12 pack of beer per day 02/10/17     Allergies   Penicillins and Robitussin liquid center [menthol]   Review of Systems Review of Systems  Unable to perform ROS: Acuity of condition     Physical  Exam Updated Vital Signs BP 135/77   Pulse 103   Temp 98.2 F (36.8 C) (Oral)   Resp (!) 27   Ht 5\' 4"  (1.626 m)   Wt 210 lb (95.3 kg)   SpO2 (!) 85%   BMI 36.05 kg/m   Physical Exam  Constitutional: She is oriented to person, place, and time. She appears well-developed and well-nourished. She appears distressed.  HENT:  Head: Normocephalic and atraumatic.  Eyes: Conjunctivae and EOM are normal.  Cardiovascular: Regular rhythm.  Tachycardia present.   Pulmonary/Chest: Accessory muscle usage present. No stridor. Tachypnea noted. She is in respiratory distress. She has decreased breath sounds. She has wheezes.  Abdominal: She exhibits no distension.  Musculoskeletal: She exhibits no edema.  Neurological: She is alert and oriented to person, place, and time. No cranial nerve deficit.  Skin: Skin is warm. She is diaphoretic.  Psychiatric: She has a normal mood and affect.  Nursing note and vitals reviewed.    ED Treatments / Results  Labs (all labs ordered are listed, but only abnormal results are displayed) Labs Reviewed  COMPREHENSIVE METABOLIC PANEL - Abnormal; Notable for the following:       Result Value   Sodium 133 (*)    Chloride 100 (*)    Glucose, Bld 411 (*)    BUN <5 (*)    Calcium 8.5 (*)     Total Protein 5.9 (*)    Albumin 2.8 (*)    ALT 12 (*)    All other components within normal limits  CBC WITH DIFFERENTIAL/PLATELET - Abnormal; Notable for the following:    RBC 3.82 (*)    Hemoglobin 11.4 (*)    HCT 34.4 (*)    All other components within normal limits  TROPONIN I - Abnormal; Notable for the following:    Troponin I 0.05 (*)    All other components within normal limits  BRAIN NATRIURETIC PEPTIDE    EKG  EKG Interpretation  Date/Time:  Tuesday February 23 2017 10:21:00 EST Ventricular Rate:  106 PR Interval:    QRS Duration: 143 QT Interval:  386 QTC Calculation: 513 R Axis:   -70 Text Interpretation:  Sinus tachycardia Ventricular premature complex Right bundle branch block Artifact Abnormal ekg Confirmed by Gerhard Munch  MD ([id number]) on 02/23/2017 10:39:28 AM       Chart review demonstrates multiple prior cardiac evaluations with similar rhythm.  Radiology Dg Chest Port 1 View  Result Date: 02/23/2017 CLINICAL DATA:  Respiratory distress for 2 days, worsened today. EXAM: PORTABLE CHEST 1 VIEW COMPARISON:  PA and lateral chest 02/10/2017 and 02/03/2017. CT chest 05/07/2016. FINDINGS: The lungs are clear. Heart size is upper normal. No pneumothorax pleural effusion. No acute bony abnormality. IMPRESSION: No acute disease. Electronically Signed   By: Drusilla Kanner M.D.   On: 02/23/2017 10:47    Procedures Procedures (including critical care time)  Medications Ordered in ED Medications  methylPREDNISolone sodium succinate (SOLU-MEDROL) 125 mg/2 mL injection 125 mg (125 mg Intravenous Not Given 02/23/17 1128)  albuterol (PROVENTIL) (2.5 MG/3ML) 0.083% nebulizer solution 10 mg (10 mg Nebulization Given 02/23/17 1033)  ketorolac (TORADOL) 30 MG/ML injection 15 mg (15 mg Intravenous Given 02/23/17 1143)   After the initial evaluation the patient received continuous albuterol nebulizer session.  Update: Patient substantially improved, now off noninvasive ventilatory  support.   Update: Patient now more calm. Initial labs notable for elevated troponin. Patient states that she has been on 60 mg prednisone, daily, until 3 weeks  ago. In addition, patient states that she has previously been advised she needs home oxygen, but has no home oxygen. Without nasal cannula, or supplemental oxygen, the patient has hypoxia, saturation 84, 85%.  Although the patient feels substantially better, given her hypoxia, respiratory distress, concern for COPD exacerbation with elevated troponin, likely demand ischemia, patient will require admission for further evaluation, management.    Initial Impression / Assessment and Plan / ED Course  I have reviewed the triage vital signs and the nursing notes.  Pertinent labs & imaging results that were available during my care of the patient were reviewed by me and considered in my medical decision making (see chart for details).    Final Clinical Impressions(s) / ED Diagnoses  Respiratory distress Hypoxia Elevated troponin  CRITICAL CARE Performed by: Gerhard Munch Total critical care time: 35 minutes Critical care time was exclusive of separately billable procedures and treating other patients. Critical care was necessary to treat or prevent imminent or life-threatening deterioration. Critical care was time spent personally by me on the following activities: development of treatment plan with patient and/or surrogate as well as nursing, discussions with consultants, evaluation of patient's response to treatment, examination of patient, obtaining history from patient or surrogate, ordering and performing treatments and interventions, ordering and review of laboratory studies, ordering and review of radiographic studies, pulse oximetry and re-evaluation of patient's condition.     Gerhard Munch, MD 02/23/17 1208

## 2017-02-23 NOTE — Progress Notes (Signed)
Received patient from ED alert and oriented, CBG checked 598, M.Kyazimova PA paged,made aware.Oredeed to give 15units Insulin and let the patient eat and rechecked CBG again if it is still hight give the SSI,9units.

## 2017-02-24 LAB — CBC WITH DIFFERENTIAL/PLATELET
BASOS PCT: 0 %
Basophils Absolute: 0 10*3/uL (ref 0.0–0.1)
EOS PCT: 0 %
Eosinophils Absolute: 0 10*3/uL (ref 0.0–0.7)
HCT: 34.4 % — ABNORMAL LOW (ref 36.0–46.0)
Hemoglobin: 11.7 g/dL — ABNORMAL LOW (ref 12.0–15.0)
Lymphocytes Relative: 14 %
Lymphs Abs: 0.7 10*3/uL (ref 0.7–4.0)
MCH: 30.2 pg (ref 26.0–34.0)
MCHC: 34 g/dL (ref 30.0–36.0)
MCV: 88.9 fL (ref 78.0–100.0)
Monocytes Absolute: 0.1 10*3/uL (ref 0.1–1.0)
Monocytes Relative: 1 %
Neutro Abs: 4.2 10*3/uL (ref 1.7–7.7)
Neutrophils Relative %: 85 %
PLATELETS: 229 10*3/uL (ref 150–400)
RBC: 3.87 MIL/uL (ref 3.87–5.11)
RDW: 12.4 % (ref 11.5–15.5)
WBC: 4.9 10*3/uL (ref 4.0–10.5)

## 2017-02-24 LAB — BASIC METABOLIC PANEL
ANION GAP: 13 (ref 5–15)
BUN: 8 mg/dL (ref 6–20)
CALCIUM: 8.7 mg/dL — AB (ref 8.9–10.3)
CO2: 21 mmol/L — AB (ref 22–32)
CREATININE: 1.14 mg/dL — AB (ref 0.44–1.00)
Chloride: 99 mmol/L — ABNORMAL LOW (ref 101–111)
GFR, EST NON AFRICAN AMERICAN: 57 mL/min — AB (ref >60)
Glucose, Bld: 647 mg/dL (ref 65–99)
Potassium: 4.2 mmol/L (ref 3.5–5.1)
SODIUM: 133 mmol/L — AB (ref 135–145)

## 2017-02-24 LAB — GLUCOSE, CAPILLARY
GLUCOSE-CAPILLARY: 327 mg/dL — AB (ref 65–99)
GLUCOSE-CAPILLARY: 145 mg/dL — AB (ref 65–99)
Glucose-Capillary: 240 mg/dL — ABNORMAL HIGH (ref 65–99)

## 2017-02-24 LAB — TROPONIN I: TROPONIN I: 0.05 ng/mL — AB (ref <0.03)

## 2017-02-24 MED ORDER — PREDNISONE 5 MG PO TABS: ORAL_TABLET | ORAL | 0 refills | Status: DC

## 2017-02-24 MED ORDER — POTASSIUM CHLORIDE ER 20 MEQ PO TBCR: 20.0000 meq | EXTENDED_RELEASE_TABLET | Freq: Every day | ORAL | 0 refills | Status: DC

## 2017-02-24 MED ORDER — INSULIN GLARGINE 100 UNIT/ML ~~LOC~~ SOLN
10.0000 [IU] | SUBCUTANEOUS | Status: AC
  Administered 2017-02-24: 10 [IU] via SUBCUTANEOUS
  Filled 2017-02-24: qty 0.1

## 2017-02-24 MED ORDER — FUROSEMIDE 10 MG/ML IJ SOLN
40.0000 mg | Freq: Two times a day (BID) | INTRAMUSCULAR | Status: DC
  Administered 2017-02-24: 40 mg via INTRAVENOUS
  Filled 2017-02-24: qty 4

## 2017-02-24 MED ORDER — INSULIN ASPART 100 UNIT/ML ~~LOC~~ SOLN
10.0000 [IU] | Freq: Once | SUBCUTANEOUS | Status: AC
  Administered 2017-02-24: 10 [IU] via SUBCUTANEOUS

## 2017-02-24 MED ORDER — FUROSEMIDE 40 MG PO TABS: 40.0000 mg | ORAL_TABLET | Freq: Two times a day (BID) | ORAL | 0 refills | Status: DC

## 2017-02-24 MED ORDER — IPRATROPIUM-ALBUTEROL 0.5-2.5 (3) MG/3ML IN SOLN
3.0000 mL | Freq: Four times a day (QID) | RESPIRATORY_TRACT | Status: DC
Start: 2017-02-24 — End: 2017-02-24
  Administered 2017-02-24 (×2): 3 mL via RESPIRATORY_TRACT
  Filled 2017-02-24 (×2): qty 3

## 2017-02-24 MED ORDER — AZITHROMYCIN 250 MG PO TABS: 250.0000 mg | ORAL_TABLET | Freq: Every day | ORAL | 0 refills | Status: AC

## 2017-02-24 MED ORDER — INSULIN ASPART 100 UNIT/ML ~~LOC~~ SOLN
0.0000 [IU] | Freq: Three times a day (TID) | SUBCUTANEOUS | Status: DC
  Administered 2017-02-24: 3 [IU] via SUBCUTANEOUS
  Administered 2017-02-24: 7 [IU] via SUBCUTANEOUS

## 2017-02-24 MED ORDER — INSULIN ASPART 100 UNIT/ML ~~LOC~~ SOLN
25.0000 [IU] | SUBCUTANEOUS | Status: AC
  Administered 2017-02-24: 25 [IU] via SUBCUTANEOUS

## 2017-02-24 MED ORDER — INSULIN ASPART 100 UNIT/ML ~~LOC~~ SOLN: 0.0000 [IU] | Freq: Three times a day (TID) | SUBCUTANEOUS | Status: DC

## 2017-02-24 MED FILL — FUROSEMIDE 40 MG TABLET: 40 | 30 days supply | Qty: 60 | Fill #0

## 2017-02-24 MED FILL — predniSONE 5 MG TABS: 5 | 21 days supply | Qty: 95 | Fill #0

## 2017-02-24 MED FILL — AZITHROMYCIN 250 MG TABLET: 250 | 6 days supply | Qty: 6 | Fill #0

## 2017-02-24 MED FILL — POTASSIUM CL ER 20 MEQ TAB: 20 | 30 days supply | Qty: 30 | Fill #0

## 2017-02-24 NOTE — Progress Notes (Signed)
Home oxygen and rolling walker ordered as requested and to be delivered to the room today prior to discharging home; Alexis Goodell [phone]

## 2017-02-24 NOTE — Evaluation (Signed)
Physical Therapy Evaluation Patient Details Name: Carla Hicks MRN: [id number] DOB: 1971/07/15 Today's Date: 02/24/2017   History of Present Illness  Pt is a 46 y/o female admitted secondary to SOB, COPD exacerbation. PMH including but not limited to current tobacco user, DM, COPD, emphysema and asthma.  Clinical Impression  Pt presented sitting OOB in recliner when PT entered room. Prior to admission, pt reported that she was independent with all functional mobility. Pt currently at mod I level for transfers and supervision for ambulation with use of RW. Pt ambulated on RA with SPO2 maintaining at 90-92% and with 2L of O2 via [place] pt's SPO2 maintained >95%. Pt with increased DOE and anxiety with activity. Pt would continue to benefit from skilled physical therapy services at this time while admitted and after d/c to address her below listed limitations in order to improve her overall safety and independence with functional mobility.      Follow Up Recommendations Home health PT    Equipment Recommendations  Other (comment);3in1 (PT) (rollator)    Recommendations for Other Services       Precautions / Restrictions Precautions Precautions: Fall Restrictions Weight Bearing Restrictions: No      Mobility  Bed Mobility               General bed mobility comments: pt sitting OOB in recliner when PT entered room  Transfers Overall transfer level: Modified independent Equipment used: None                Ambulation/Gait Ambulation/Gait assistance: Supervision Ambulation Distance (Feet): 50 Feet Assistive device: Rolling walker (2 wheeled) Gait Pattern/deviations: Step-through pattern;Trunk flexed;Shuffle Gait velocity: decreased Gait velocity interpretation: Below normal speed for age/gender General Gait Details: slow cadence, safe with RW but increased DOE  Information systems manager [place] (Stroke Patients Only)       Balance  Overall balance assessment: Needs assistance Sitting-balance support: Feet supported Sitting balance-Leahy Scale: Good     Standing balance support: During functional activity Standing balance-Leahy Scale: Fair                               Pertinent Vitals/Pain Pain Assessment: 0-10 Pain Score: 8  Pain Location: bilateral ankles and feet Pain Descriptors / Indicators: Sore Pain Intervention(s): Monitored during session    Home Living Family/patient expects to be discharged to:: Private residence Living Arrangements: Spouse/significant other Available Help at Discharge: Family;Available PRN/intermittently Type of Home: House Home Access: Stairs to enter Entrance Stairs-Rails: Doctor, general practice of Steps: 4 Home Layout: One level Home Equipment: None      Prior Function Level of Independence: Independent               Hand Dominance        Extremity/Trunk Assessment   Upper Extremity Assessment Upper Extremity Assessment: Overall WFL for tasks assessed    Lower Extremity Assessment Lower Extremity Assessment: Generalized weakness    Cervical / Trunk Assessment Cervical / Trunk Assessment: Normal  Communication   Communication: No difficulties  Cognition Arousal/Alertness: Awake/alert Behavior During Therapy: WFL for tasks assessed/performed;Anxious Overall Cognitive Status: Within Functional Limits for tasks assessed                      General Comments      Exercises     Assessment/Plan  PT Assessment Patient needs continued PT services  PT Problem List Decreased strength;Decreased activity tolerance;Decreased balance;Decreased mobility;Decreased coordination;Decreased safety awareness;Decreased knowledge of use of DME;Cardiopulmonary status limiting activity;Pain       PT Treatment Interventions DME instruction;Gait training;Stair training;Functional mobility training;Therapeutic activities;Therapeutic  exercise;Balance training;Neuromuscular re-education;Patient/family education    PT Goals (Current goals can be found in the Care Plan section)  Acute Rehab PT Goals Patient Stated Goal: return home today PT Goal Formulation: With patient Time For Goal Achievement: 03/10/17 Potential to Achieve Goals: Good    Frequency Min 3X/week   Barriers to discharge        Co-evaluation               End of Session Equipment Utilized During Treatment: Oxygen Activity Tolerance: Patient limited by fatigue Patient left: in chair;with call bell/phone within reach;with family/visitor present Nurse Communication: Mobility status PT Visit Diagnosis: Other abnormalities of gait and mobility (R26.89);Muscle weakness (generalized) (M62.81)    Functional Assessment Tool Used: AM-PAC 6 Clicks Basic Mobility;Clinical judgement Functional Limitation: Mobility: Walking and moving around Mobility: Walking and Moving Around Current Status (Z6109): At least 1 percent but less than 20 percent impaired, limited or restricted Mobility: Walking and Moving Around Goal Status [phone]): 0 percent impaired, limited or restricted    Time: 0981-1914 PT Time Calculation (min) (ACUTE ONLY): 14 min   Charges:   PT Evaluation $PT Eval Low Complexity: 1 Procedure     PT G Codes:   PT G-Codes **NOT FOR INPATIENT CLASS** Functional Assessment Tool Used: AM-PAC 6 Clicks Basic Mobility;Clinical judgement Functional Limitation: Mobility: Walking and moving around Mobility: Walking and Moving Around Current Status (N8295): At least 1 percent but less than 20 percent impaired, limited or restricted Mobility: Walking and Moving Around Goal Status [phone]): 0 percent impaired, limited or restricted     [facility] 02/24/2017, 9:05 AM Deborah Chalk, PT, DPT [phone]

## 2017-02-24 NOTE — Progress Notes (Signed)
Patient wants to leave the hospital and  she is not medically stable,per MD . It was explained to the patient with the family at bedside  the risk of leaving the hospital against medical advice. Patient stated "I will just come back then". Patient signed AMA form,

## 2017-02-24 NOTE — Progress Notes (Signed)
CRITICAL VALUE ALERT  Critical value received:  Serum glucose 647 Date of notification:  02/24/17   Time of notification:  0115 Critical value read back:Yes.    Nurse who received alert:  Jeanella Flattery   MD notified (1st page):  Craige Cotta, NP  Time of first page:  1:18 AM   Orders for insulin received

## 2017-02-24 NOTE — Progress Notes (Signed)
Patient states she's breathing much better and not as short of breath while walking.

## 2017-02-24 NOTE — Progress Notes (Signed)
SATURATION QUALIFICATIONS: (This note is used to comply with regulatory documentation for home oxygen)  Patient Saturations on Room Air at Rest = 96%  Patient Saturations on Room Air while Ambulating = 86%  Patient Saturations on 3 Liters of oxygen while Ambulating = 94%  Please briefly explain why patient needs home oxygen: Desats while walking.

## 2017-02-24 NOTE — Discharge Instructions (Signed)
Follow with Primary MD Lora Paula, MD in 7 days   Get CBC, CMP, 2 view Chest X ray checked  by Primary MD or SNF MD in 5-7 days ( we routinely change or add medications that can affect your baseline labs and fluid status, therefore we recommend that you get the mentioned basic workup next visit with your PCP, your PCP may decide not to get them or add new tests based on their clinical decision)  Activity: As tolerated with Full fall precautions use walker/cane & assistance as needed  Disposition Home     Diet:   Diet heart healthy/carb modified  Check your Weight same time everyday, if you gain over 2 pounds, or you develop in leg swelling, experience more shortness of breath or chest pain, call your Primary MD immediately. Follow Cardiac Low Salt Diet and 1.5 lit/day fluid restriction.  Accuchecks 4 times/day, Once in AM empty stomach and then before each meal. Log in all results and show them to your Prim.MD in 3 days. If any glucose reading is under 80 or above 300 call your Prim MD immidiately. Follow Low glucose instructions for glucose under 80 as instructed.   On your next visit with your primary care physician please Get Medicines reviewed and adjusted.  Please request your Prim.MD to go over all Hospital Tests and Procedure/Radiological results at the follow up, please get all Hospital records sent to your Prim MD by signing hospital release before you go home.  If you experience worsening of your admission symptoms, develop shortness of breath, life threatening emergency, suicidal or homicidal thoughts you must seek medical attention immediately by calling 911 or calling your MD immediately  if symptoms less severe.  You Must read complete instructions/literature along with all the possible adverse reactions/side effects for all the Medicines you take and that have been prescribed to you. Take any new Medicines after you have completely understood and accpet all the possible  adverse reactions/side effects.   Do not drive, operate heavy machinery, perform activities at heights, swimming or participation in water activities or provide baby sitting services if your were admitted for syncope or siezures until you have seen by Primary MD or a Neurologist and advised to do so again.  Do not drive when taking Pain medications.    Do not take more than prescribed Pain, Sleep and Anxiety Medications  Special Instructions: If you have smoked or chewed Tobacco  in the last 2 yrs please stop smoking, stop any regular Alcohol  and or any Recreational drug use.  Wear Seat belts while driving.   Please note  You were cared for by a hospitalist during your hospital stay. If you have any questions about your discharge medications or the care you received while you were in the hospital after you are discharged, you can call the unit and asked to speak with the hospitalist on call if the hospitalist that took care of you is not available. Once you are discharged, your primary care physician will handle any further medical issues. Please note that NO REFILLS for any discharge medications will be authorized once you are discharged, as it is imperative that you return to your primary care physician (or establish a relationship with a primary care physician if you do not have one) for your aftercare needs so that they can reassess your need for medications and monitor your lab values.

## 2017-02-24 NOTE — Progress Notes (Signed)
Inpatient Diabetes Program Recommendations  AACE/ADA: New Consensus Statement on Inpatient Glycemic Control (2015)  Target Ranges:  Prepandial:   less than 140 mg/dL      Peak postprandial:   less than 180 mg/dL (1-2 hours)      Critically ill patients:  140 - 180 mg/dL   Lab Results  Component Value Date   GLUCAP 240 (H) 02/24/2017   HGBA1C 8.3 (H) 02/11/2017    Review of Glycemic Control Results for Carla Hicks, Carla Hicks (MRN [id number]) as of 02/24/2017 11:51  Ref. Range 02/23/2017 20:25 02/23/2017 23:43 02/24/2017 04:22 02/24/2017 07:33 02/24/2017 11:23  Glucose-Capillary Latest Ref Range: 65 - 99 mg/dL 850 (HH) 277 (HH) 412 (H) 145 (H) 240 (H)   Diabetes history: DM2 Outpatient Diabetes medications: Lantus 60 units + Novolog meal coverage tid 10 units breakfast+ 10 units lunch + 15 dinner + Metformin 1 gm bid Current orders for Inpatient glycemic control: Lantus 60 units + Novolog correction 0-20 units tid  Inpatient Diabetes Program Recommendations:  Noted elevated post prandial CBGs.  Please consider: -Novolog 5 units tid meal coverage if eats 50% -Add Novolog correction 0-5 units @ hs  Thank you, Darel Hong E. Keyasha Miah, RN, MSN, CDE Inpatient Glycemic Control Team Team Pager [phone] (8am-5pm) 02/24/2017 11:56 AM

## 2017-02-24 NOTE — Progress Notes (Signed)
SATURATION QUALIFICATIONS: (This note is used to comply with regulatory documentation for home oxygen)  Patient Saturations on Room Air at Rest =96%  Patient Saturations on Room Air while Ambulating = 86%   Please briefly explain why patient needs home oxygen:

## 2017-02-24 NOTE — Discharge Summary (Signed)
AMA  Patient at this time expresses desire to leave the Hospital immidiately, patient has been warned that this is not Medically advisable at this time, and can result in Medical complications like Death and Disability, patient understands and accepts the risks involved and assumes full responsibilty of this decision.  Patient has been provided steroid taper along with oral Lasix and potassium supplementation, in my opinion she has you're on chronic diastolic CHF and recent echo shows EF of 60%, also mild asthma exacerbation cannot be ruled out due to ongoing wheezing which could be due to pulmonary congestion also.  She was extensively counseled to stay in the hospital however she chose to sign AMA and leave, have ordered home oxygen as well upon discharge. Requested her to follow with PCP ASAP.    Leroy Sea M.D on 02/24/2017 at 11:43 AM  [facility]  Time < 30 minutes  Last Note Below  History and Physical  Carla Hicks ZOX:[id number] DOB: September 03, 1971 DOA: 02/23/2017  PCP: Lora Paula, MD  Patient coming from: home  Chief Complaint: abrupt onset SOB HPI: Carla Hicks is a 46 y.o. female with medical history significant COPD with ongoing tobacco abuse, chronic alcohol-induced pncreatitis, diabetes with diabetic neuropathy, systemic hypertension, diastolic CHF and pulmonary hypertension  Patient presented to the emergency department via EMS that was summoned by her family due to respiratory distress with increased work of breathing earlier this morning. She reported that during the last few days she had to use her nebulizer more frequently At times even every 1-2 hours without improvement and believes that her exacerbation is d/t stopping oral steroid therapy by her PCP  Patient was given nebulizerser treatment en route and  somewhat improved, but on arrival her oxygen saturation was initially 89% on ambient air and continued to drop  ED Course: Her vital signs in the ED she was tachycardic with heart rate of 107, tachypneic with respirations of 28/m, was within normal limits, but SpO2 dropped to 85% on room air. Patient was in obvious respiratory distress with increased work of breathing, despite continuous nebulizer therapy she required placement on BiPAP in the emergency department.  Blood work in the ED showed hemoglobin of 11.4, normal white blood cells count-7100, given 133, mildly elevated troponin 0.05, elevated glucose of 411  EKG showed sinus rhythm with right bundle branch block, no new abnormalities  Chest Xray was negative for acute abnormality  Review of Systems: As per HPI otherwise 10 point review of systems negative.  Ambulatory Status:      Past Medical History:  Diagnosis Date  . Asthma   . COPD (chronic obstructive pulmonary disease) (HCC)   . Diabetes mellitus without complication (HCC)   . Emphysema lung (HCC)         Past Surgical History:  Procedure Laterality Date  . CESAREAN SECTION     Social History        Social History  . Marital status: Legally Separated    Spouse name: N/A  . Number of children: N/A  . Years of education: N/A       Occupational History  . Lab tech  Social History Main Topics  . Smoking status: Current Every Day Smoker    Packs/day: 0.50    Years: 25.00    Types: Cigarettes  . Smokeless tobacco: Never Used  . Alcohol use 0.0 oz/week     Comment: a 40oz or a 12 pack of beer per day 02/10/17  . Drug use: No  . Sexual activity: Yes    Birth control/ protection: None       Other Topics Concern  . Not on file      Social History Narrative  . No narrative on file        Allergies  Allergen Reactions  . Penicillins Hives    Has patient had a PCN reaction causing immediate rash, facial/tongue/throat swelling, SOB or lightheadedness  with hypotension: Yes  Has patient had a PCN reaction causing severe rash involving mucus membranes or skin necrosis: No  Has patient had a PCN reaction that required hospitalization: Yes  Has patient had a PCN reaction occurring within the last 10 years: No  If all of the above answers are "NO", then may proceed with Cephalosporin use.   . Robitussin Liquid Center [Menthol] Hives         Family History  Problem Relation Age of Onset  . Emphysema Mother     smoked  . Allergies Mother   . Asthma Mother   . Breast cancer Mother   . Uterine cancer Mother   . Diabetes Father   . Heart disease Maternal Grandmother           Prior to Admission medications   Medication Sig Start Date End Date Taking? Authorizing Provider  acetaminophen-codeine (TYLENOL #4) 300-60 MG tablet Take 1 tablet by mouth every 6 (six) hours as needed for pain. 02/12/17  Yes Ripudeep Jenna Luo, MD  albuterol (PROVENTIL HFA;VENTOLIN HFA) 108 (90 Base) MCG/ACT inhaler Inhale 2 puffs into the lungs every 4 (four) hours as needed for wheezing or shortness of breath. 02/12/17  Yes Ripudeep Jenna Luo, MD  amitriptyline (ELAVIL) 25 MG tablet Take 3 tablets (75 mg total) by mouth at bedtime. 02/10/17  Yes Josalyn Funches, MD  atorvastatin (LIPITOR) 20 MG tablet TAKE 1 TABLET BY MOUTH DAILY AT 6 PM 08/12/16  Yes Josalyn Funches, MD  budesonide-formoterol (SYMBICORT) 80-4.5 MCG/ACT inhaler Take 2 puffs first thing in am and then another 2 puffs about 12 hours later. 12/02/16  Yes Pete Glatter, MD  cloNIDine (CATAPRES) 0.1 MG tablet Take 1 tablet (0.1 mg total) by mouth 2 (two) times daily. 04/21/16  Yes Rhetta Mura, MD  gabapentin (NEURONTIN) 300 MG capsule Take 3 capsules (900 mg total) by mouth 3 (three) times daily. 02/12/17  Yes Ripudeep Jenna Luo, MD  gemfibrozil (LOPID) 600 MG tablet Take 1 tablet (600 mg total) by mouth 2 (two) times daily before a meal. 04/21/16  Yes Rhetta Mura, MD  guaiFENesin-codeine 100-10 MG/5ML syrup  Take 5 mLs by mouth 3 (three) times daily as needed for cough. 02/12/17  Yes Ripudeep Jenna Luo, MD  ibuprofen (ADVIL) 200 MG tablet Take 1-2 tablets (200-400 mg total) by mouth every 6 (six) hours as needed for mild pain or moderate pain.  Patient taking differently: Take 800 mg by mouth every 6 (six) hours as needed for mild pain or moderate pain.  05/01/16  Yes Lonia Blood, MD  insulin aspart (NOVOLOG) 100 UNIT/ML injection Inject 10 U with breakfast and lunch, 15 U with dinner  Patient taking differently: Inject 10-15 Units  into the skin 3 (three) times daily with meals. 10 units with breakfast then 10 units with lunch then 15 units with dinner (evening meal) 10/01/16  Yes Josalyn Funches, MD  insulin glargine (LANTUS) 100 UNIT/ML injection Inject 0.6 mLs (60 Units total) into the skin at bedtime. 10/01/16  Yes Josalyn Funches, MD  ipratropium-albuterol (DUONEB) 0.5-2.5 (3) MG/3ML SOLN Take 3 mLs by nebulization every 6 (six) hours as needed (sob/doe). 12/02/16  Yes Pete Glatter, MD  metFORMIN (GLUCOPHAGE) 1000 MG tablet Take 1 tablet (1,000 mg total) by mouth 2 (two) times daily with a meal. 10/01/16  Yes Josalyn Funches, MD  Multiple Vitamin (MULTIVITAMIN WITH MINERALS) TABS tablet Take 1 tablet by mouth daily.   Yes Historical Provider, MD  predniSONE (DELTASONE) 20 MG tablet Take 60 mg by mouth daily with breakfast.   Yes Historical Provider, MD  diclofenac sodium (VOLTAREN) 1 % GEL Apply 1 application topically 4 (four) times daily.  Patient not taking: Reported on 02/23/2017 05/30/15   Hayden Rasmussen, NP  doxycycline (VIBRA-TABS) 100 MG tablet Take 1 tablet (100 mg total) by mouth 2 (two) times daily. X 7days  Patient not taking: Reported on 02/23/2017 02/12/17   Ripudeep Jenna Luo, MD  pantoprazole (PROTONIX) 40 MG tablet Take 1 tablet (40 mg total) by mouth daily. Take 30-60 min before first meal of the day  Patient not taking: Reported on 02/23/2017 09/18/15   Vivianne Master, PA-C  triamcinolone  ointment (KENALOG) 0.5 % Apply 1 application topically 2 (two) times daily.  Patient not taking: Reported on 02/23/2017 08/21/16   Dessa Phi, MD  Physical Exam:        Vitals:   02/23/17 1230 02/23/17 1300 02/23/17 1330 02/23/17 1400  BP: (!) 142/102 (!) 149/102 146/97 137/92  Pulse: 106 104 102 105  Resp: 24 24 26  (!) 30  Temp:      TempSrc:      SpO2: (!) 89% 90% (!) 87% (!) 89%  Weight:      Height:       General: Appears calm and comfortable  Eyes: PERRLA, EOMI, normal lids, iris  ENT: grossly normal hearing, lips & tongue, mucous membranes moist and intact  Neck: no lymphoadenopathy, masses or thyromegaly  Cardiovascular: RRR, no m/r/g. No JVD, carotid bruits. No LE edema.  Respiratory: bilateral faint wheezes,no rales, rhonchi, basilar scattered cracles. Normal respiratory effort. No accessory muscle use observed  Abdomen: soft, non-tender, non-distended, no organomegaly or masses appreciated. BS present in all quadrants  Skin: no rash, ulcers or induration seen on limited exam  Musculoskeletal: grossly normal tone BUE/BLE, good ROM, no bony abnormality or joint deformities observed  Psychiatric: grossly normal mood and affect, speech fluent and appropriate, alert and oriented x3  Neurologic: CN II-XII grossly intact, moves all extremities in coordinated fashion, sensation intact Labs on Admission: I have personally reviewed following labs and imaging studies  CBC, BMP  GFR:  Estimated Creatinine Clearance: 99.4 mL/min (by C-G formula based on SCr of 0.7 mg/dL).  Creatinine Clearance:  Estimated Creatinine Clearance: 99.4 mL/min (by C-G formula based on SCr of 0.7 mg/dL).  Radiological Exams on Admission:   Imaging Results (Last 48 hours)     EKG: Independently reviewed - sinus rhythm. No new acute changes  Assessment/Plan  Principal Problem:  Acute respiratory failure with hypoxia (HCC)  Active Problems:  Dyslipidemia associated with type 2 diabetes mellitus (HCC)    Chronic diastolic CHF (congestive heart failure) (HCC)  Uncontrolled type 2 diabetes  mellitus with complication (HCC)  Diabetic neuropathy associated with type 2 diabetes mellitus (HCC)  COPD exacerbation (HCC)  HTN (hypertension)   Acute respiratory failure most likely d/t COPD exacerbation  Continue nebulizer therapy, antibiotic, IV steroid  Might consider transition to oral steroid at discharge if no contraindication  Mildly elevated troponin - probably associated with tachycardia  Patient has no c/o chest pain, EKG did not demonstrate acute changes  Will continue to monitor on telemetry and cycle cardiac enzymes  Last Echo in 2017 with EF 60-65%, no WMA, mild-moderate LVH  DM type II - uncontrolled most recent HgbA1C is 8.3% on 02/11/17  Hold Metformin while hospitalized, continue basal and meal time coverage home doses  Continue diabetic diet, monitor FSBS QACHS, add sliding scale insulin  Hyperlipidemia - most recent lipid profile on 02/11/17 demonstrated normal cholesterol except elevated TGL to 218  Continue statin therapy and add Lovaza  Need follow up of fasting lipids and LFT in two months with her PCP  Hypertension - currently stable  Continue home medication and adjust the doses if needed depending on the BP readings  DVT prophylaxis: Heparin  Code Status: DNR  Family Communication: none  Disposition Plan: Med Surg  Consults called: none  Admission status: observation  Raymon Mutton, PA-C  Pager: [phone]  [facility]  If 7PM-7AM, please contact night-coverage  [email or web address]  [facility] 02/23/2017, 2:39 PM

## 2017-02-24 NOTE — Progress Notes (Signed)
Patient expresses a constant decision to go home, educated patient on diuresing and getting her blood glucose and hypertension under control first. Patient still believes she is going home today, but will wait until the physicians come to talk to her later.

## 2017-02-24 NOTE — Progress Notes (Signed)
Patient wants to go home ASAP,MD aware.

## 2017-02-24 NOTE — Progress Notes (Signed)
Patient discharge per MD order,discharge instructions done discussed with patient verbalized understanding.PIV removed no s/s of infiltration or swelling noted. CM made aware of home health needs.

## 2017-02-24 NOTE — Care Management Note (Signed)
Case Management Note  Patient Details  Name: Carla Hicks MRN: [id number] Date of Birth: 1971-05-13  Subjective/Objective:  Admitted with Acute Resp Failure with Hypoxia                  Action/Plan: PCP: Lora Paula MD / [facility]; she can get her medication there at discharge also; lives at home; possibly need home oxygen- awaiting for qualifying 02 sats for home 02; CM will continue to follow for DCP  Expected Discharge Date:     Possibly 02/27/2017             Expected Discharge Plan:  Home/Self Care  In-House Referral:    Financial Counselor/ pt filed for [facility] 11/17 Discharge planning Services  CM Consult   Status of Service:  In process, will continue to follow  Reola Mosher [phone] 02/24/2017, 10:26 AM

## 2017-02-24 NOTE — Progress Notes (Signed)
SATURATION QUALIFICATIONS: (This note is used to comply with regulatory documentation for home oxygen)  Patient Saturations on Room Air at Rest = 94%  Patient Saturations on Room Air while Ambulating = 90-92%  Patient Saturations on 2 Liters of oxygen while Ambulating = 95%  Please briefly explain why patient needs home oxygen: Pt's SPO2 does decrease to low 90's with activity, but did not drop below 90% on RA. Pt with increased DOE and becomes anxious with activity.   [place], [place], [place] [phone]

## 2017-02-24 NOTE — Evaluation (Signed)
Occupational Therapy Evaluation Patient Details Name: Carla Hicks MRN: [id number] DOB: [birth year] Today's Date: 02/24/2017    History of Present Illness Pt is a 46 y/o female admitted secondary to SOB, COPD exacerbation. PMH including but not limited to current tobacco user, DM, COPD, emphysema and asthma.   Clinical Impression   PTA, pt lived spouse and received A for dressing and bathing from children. Children also performed all IADls. Currently, pt performs dressing with set up and min guard for sit<>Stand due to fear of falling. VSS throughout session and room air. Pt and family educated on purse lip breathing, RW management, fall prevention, and energy conservation. Pt verbalized understanding. Will continue to follow acutely to facilitate pt's safe dc home and increase pt's occupational participation. Recommend pt dc home  once medically stable per physician with initial 24/7 superversion and HHOT to increase pt safety and fall prevention.    Follow Up Recommendations  Home health OT;Supervision/Assistance - 24 hour    Equipment Recommendations  Tub/shower bench;Other (comment) (Educated pt on transfer bench; family says they will get lat)    Recommendations for Other Services       Precautions / Restrictions Precautions Precautions: Fall Restrictions Weight Bearing Restrictions: No      Mobility Bed Mobility               General bed mobility comments: Pt in recliner upon arrival  Transfers Overall transfer level: Needs assistance Equipment used: None Transfers: Sit to/from Stand Sit to Stand: Min guard         General transfer comment: Pt able to perform transfer however very anxious about falling    Balance Overall balance assessment: Needs assistance Sitting-balance support: Feet supported Sitting balance-Leahy Scale: Normal     Standing balance support: During functional activity Standing balance-Leahy Scale: Fair                               ADL Overall ADL's : Needs assistance/impaired Eating/Feeding: Set up;Sitting   Grooming: Min guard;Standing   Upper Body Bathing: Min guard;Sitting   Lower Body Bathing: Moderate assistance;Sit to/from stand   Upper Body Dressing : Set up;Supervision/safety;Sitting Upper Body Dressing Details (indicate cue type and reason): Pt don night gown with supervision Lower Body Dressing: Min guard;Sit to/from stand Lower Body Dressing Details (indicate cue type and reason): Pt don socks with supervision; needed Min guard for sit<>stand due to increased fear Toilet Transfer: Min guard;Ambulation;RW;Regular Toilet   Toileting- Clothing Manipulation and Hygiene: Sit to/from stand;Min guard         General ADL Comments: Pt performed dressing in preparation for dc; pt SpO2 stayed in 90's (92-95) on room air.      Vision         Perception     Praxis      Pertinent Vitals/Pain Pain Assessment: Faces Pain Score: 8  Faces Pain Scale: Hurts a little bit Pain Location: bilateral ankles and feet Pain Descriptors / Indicators: Sore Pain Intervention(s): Monitored during session     Hand Dominance Right   Extremity/Trunk Assessment Upper Extremity Assessment Upper Extremity Assessment: Overall WFL for tasks assessed   Lower Extremity Assessment Lower Extremity Assessment: Defer to PT evaluation   Cervical / Trunk Assessment Cervical / Trunk Assessment: Normal   Communication Communication Communication: No difficulties   Cognition Arousal/Alertness: Awake/alert Behavior During Therapy: Anxious Overall Cognitive Status: Within Functional Limits for tasks assessed  General Comments: Pt very nervous to stand and states "I am so afraid to fall."   General Comments   Pt family present for end of session during pt education.     Exercises       Shoulder Instructions      Home Living Family/patient expects to be discharged to:: Private  residence Living Arrangements: Spouse/significant other Available Help at Discharge: Family;Available PRN/intermittently Type of Home: House Home Access: Stairs to enter [employer] of Steps: 4 Entrance Stairs-Rails: Right;Left Home Layout: One level     Bathroom Shower/Tub: Tub/shower unit Shower/tub characteristics: Curtain Firefighter: Handicapped height     Home Equipment: Cane - quad;Hand held shower head          Prior Functioning/Environment Level of Independence: Needs assistance    ADL's / Homemaking Assistance Needed: Pt daughter and son do IADLs and supervise dressing and bathing due to pt's fear of falling             OT Problem List: Decreased strength;Decreased activity tolerance;Impaired balance (sitting and/or standing);Decreased coordination;Decreased safety awareness;Decreased knowledge of use of DME or AE      OT Treatment/Interventions: Self-care/ADL training;Therapeutic exercise;Energy conservation;DME and/or AE instruction;Therapeutic activities;Patient/family education    OT Goals(Current goals can be found in the care plan section) Acute Rehab OT Goals Patient Stated Goal: return home today OT Goal Formulation: With patient Time For Goal Achievement: 02/24/17 Potential to Achieve Goals: Good ADL Goals Pt Will Perform Grooming: with modified independence;standing Pt Will Perform Lower Body Dressing: with modified independence;sit to/from stand Pt Will Transfer to Toilet: with modified independence;ambulating;regular height toilet Pt Will Perform Tub/Shower Transfer: with modified independence;tub bench;ambulating;rolling walker Additional ADL Goal #1: Pt will independently verbalize three energy conservation techniques  OT Frequency: Min 2X/week   Barriers to D/C:            Co-evaluation              End of Session Equipment Utilized During Treatment: Gait belt;Oxygen (3L) Nurse Communication: Mobility  status  Activity Tolerance: Patient tolerated treatment well Patient left: in chair;with call bell/phone within reach;with family/visitor present  OT Visit Diagnosis: Unsteadiness on feet (R26.81);Muscle weakness (generalized) (M62.81);History of falling (Z91.81)                ADL either performed or assessed with clinical judgement  Time: 0935-1000 OT Time Calculation (min): 25 min Charges:  OT General Charges $OT Visit: 1 Procedure OT Evaluation $OT Eval Low Complexity: 1 Procedure OT Treatments $Self Care/Home Management : 8-22 mins G-Codes: OT G-codes **NOT FOR INPATIENT CLASS** Functional Assessment Tool Used: Clinical judgement Functional Limitation: Self care Self Care Current Status (P5945): At least 20 percent but less than 40 percent impaired, limited or restricted Self Care Goal Status (O5929): At least 20 percent but less than 40 percent impaired, limited or restricted Self Care Discharge Status [phone]): At least 20 percent but less than 40 percent impaired, limited or restricted   [facility], OTR/L [phone]   Theodoro Grist [place] 02/24/2017, 10:14 AM

## 2017-02-26 ENCOUNTER — Ambulatory Visit: Payer: Self-pay | Attending: Family Medicine | Admitting: Physician Assistant

## 2017-02-26 ENCOUNTER — Encounter: Payer: Self-pay | Admitting: Physician Assistant

## 2017-02-26 ENCOUNTER — Ambulatory Visit: Payer: Self-pay

## 2017-02-26 VITALS — BP 123/90 | HR 91 | Temp 98.1°F | Resp 16 | Wt 206.6 lb

## 2017-02-26 DIAGNOSIS — J441 Chronic obstructive pulmonary disease with (acute) exacerbation: Secondary | ICD-10-CM

## 2017-02-26 DIAGNOSIS — Z5189 Encounter for other specified aftercare: Secondary | ICD-10-CM | POA: Insufficient documentation

## 2017-02-26 DIAGNOSIS — I5032 Chronic diastolic (congestive) heart failure: Secondary | ICD-10-CM

## 2017-02-26 DIAGNOSIS — E785 Hyperlipidemia, unspecified: Secondary | ICD-10-CM

## 2017-02-26 DIAGNOSIS — Z833 Family history of diabetes mellitus: Secondary | ICD-10-CM | POA: Insufficient documentation

## 2017-02-26 DIAGNOSIS — R1013 Epigastric pain: Secondary | ICD-10-CM | POA: Insufficient documentation

## 2017-02-26 DIAGNOSIS — K859 Acute pancreatitis without necrosis or infection, unspecified: Secondary | ICD-10-CM | POA: Insufficient documentation

## 2017-02-26 DIAGNOSIS — Z9981 Dependence on supplemental oxygen: Secondary | ICD-10-CM

## 2017-02-26 DIAGNOSIS — E1169 Type 2 diabetes mellitus with other specified complication: Secondary | ICD-10-CM

## 2017-02-26 DIAGNOSIS — J439 Emphysema, unspecified: Secondary | ICD-10-CM | POA: Insufficient documentation

## 2017-02-26 DIAGNOSIS — Z794 Long term (current) use of insulin: Secondary | ICD-10-CM | POA: Insufficient documentation

## 2017-02-26 DIAGNOSIS — Z880 Allergy status to penicillin: Secondary | ICD-10-CM | POA: Insufficient documentation

## 2017-02-26 DIAGNOSIS — E1142 Type 2 diabetes mellitus with diabetic polyneuropathy: Secondary | ICD-10-CM

## 2017-02-26 DIAGNOSIS — I110 Hypertensive heart disease with heart failure: Secondary | ICD-10-CM | POA: Insufficient documentation

## 2017-02-26 DIAGNOSIS — I10 Essential (primary) hypertension: Secondary | ICD-10-CM

## 2017-02-26 LAB — COMPREHENSIVE METABOLIC PANEL
ALBUMIN: 3.6 g/dL (ref 3.6–5.1)
ALK PHOS: 84 U/L (ref 33–115)
ALT: 19 U/L (ref 6–29)
AST: 26 U/L (ref 10–35)
BILIRUBIN TOTAL: 0.4 mg/dL (ref 0.2–1.2)
BUN: 11 mg/dL (ref 7–25)
CALCIUM: 8.8 mg/dL (ref 8.6–10.2)
CO2: 34 mmol/L — AB (ref 20–31)
CREATININE: 0.75 mg/dL (ref 0.50–1.10)
Chloride: 97 mmol/L — ABNORMAL LOW (ref 98–110)
Glucose, Bld: 59 mg/dL — ABNORMAL LOW (ref 65–99)
Potassium: 3.4 mmol/L — ABNORMAL LOW (ref 3.5–5.3)
SODIUM: 139 mmol/L (ref 135–146)
Total Protein: 7.3 g/dL (ref 6.1–8.1)

## 2017-02-26 LAB — GLUCOSE, POCT (MANUAL RESULT ENTRY): POC Glucose: 80 mg/dl (ref 70–99)

## 2017-02-26 MED ORDER — ACETAMINOPHEN-CODEINE #3 300-30 MG PO TABS: 1.0000 | ORAL_TABLET | ORAL | 0 refills | Status: DC | PRN

## 2017-02-26 MED ORDER — FUROSEMIDE 40 MG PO TABS: 40.0000 mg | ORAL_TABLET | Freq: Two times a day (BID) | ORAL | 2 refills | Status: AC

## 2017-02-26 MED ORDER — AMITRIPTYLINE HCL 25 MG PO TABS: 75.0000 mg | ORAL_TABLET | Freq: Every day | ORAL | 0 refills | Status: DC

## 2017-02-26 MED ORDER — GABAPENTIN 300 MG PO CAPS: 900.0000 mg | ORAL_CAPSULE | Freq: Four times a day (QID) | ORAL | 5 refills | Status: DC

## 2017-02-26 MED ORDER — POTASSIUM CHLORIDE ER 20 MEQ PO TBCR: 20.0000 meq | EXTENDED_RELEASE_TABLET | Freq: Every day | ORAL | 2 refills | Status: AC

## 2017-02-26 MED FILL — ACETAMINOPHEN/COD #3 TABLET: 300-30 | 2 days supply | Qty: 30 | Fill #0

## 2017-02-26 MED FILL — GABAPENTIN 300 MG CAPSULE: 300 | 30 days supply | Qty: 360 | Fill #0

## 2017-02-26 NOTE — Progress Notes (Signed)
Carla Hicks, is a 46 y.o. female  BJY:[id number]  YQM:[id number]  DOB - 1971-07-04  Subjective:  Chief Complaint and HPI: Carla Hicks is a 46 y.o. female here today for follow up visit after recently being in the hospital X 2. Admitted 2/21-2/23/2018 for epigastric abdominal pain and acute pancreatitis. She was admitted 02/23/2017 and left 02/24/2017 AMA. The second admission was for respiratory distress with O2 stas down to 85% on RA.  She was stabilized with O2 therapy and breathing treatments and admitted for further work-up. EKG did not show acute ischemia, but she did have a mildly elevated Troponin believed to be associated with tachycardia.  Patient refused to stay inpatient for further work-up/serial enzymes.  Denies CP today. Blood sugar was regulated with basal insulin and meal time coverage and metformin was held during admission. She was treated with IV steroids and discharged on PO prednisone taper, zpack, breathing treatments, and home O2 on 2L.  She chose to leave AMA.    She presents today feeling overall better and is using portable O2 with sats at 97%.  She still feels weak and a cough that is much improved.  She is still on prednisone and zpack.  She needs RF on Lasix, potassium, gabapentin (which she has been taking 900mg  qid).  She is also requesting pain meds for chronic lower leg pain that has been worked up in the past.  She has not been drinking alcohol and is no longer smoking.   ED/Hospital notes reviewed/ see general summary above.  Family history: mom-allergies, asthma, uterine CA, breast CA, emphysema.  Dad-diabetes.    ROS:   Constitutional:  No f/c, No night sweats, No unexplained weight loss. EENT:  No vision changes, No blurry vision, No hearing changes. No mouth, throat, or ear problems.  Respiratory: +cough, + SOB Cardiac: No CP, no palpitations GI:  No abd pain, No N/V/D. GU: No Urinary s/sx Musculoskeletal: No joint pain Neuro: No headache, no  dizziness, no motor weakness.  Skin: No rash Endocrine:  No polydipsia. No polyuria.  Psych: Denies SI/HI  Problem  On Home Oxygen Therapy    ALLERGIES: Allergies  Allergen Reactions  . Penicillins Hives    Has patient had a PCN reaction causing immediate rash, facial/tongue/throat swelling, SOB or lightheadedness with hypotension: Yes Has patient had a PCN reaction causing severe rash involving mucus membranes or skin necrosis: No Has patient had a PCN reaction that required hospitalization: Yes Has patient had a PCN reaction occurring within the last 10 years: No If all of the above answers are "NO", then may proceed with Cephalosporin use.   . Robitussin Liquid Center [Menthol] Hives    PAST MEDICAL HISTORY: Past Medical History:  Diagnosis Date  . Asthma   . COPD (chronic obstructive pulmonary disease) (HCC)   . Diabetes mellitus without complication (HCC)   . Emphysema lung (HCC)     MEDICATIONS AT HOME: Prior to Admission medications   Medication Sig Start Date End Date Taking? Authorizing Provider  acetaminophen-codeine (TYLENOL #3) 300-30 MG tablet Take 1-2 tablets by mouth every 4 (four) hours as needed for moderate pain. 02/26/17   Anders Simmonds, PA-C  albuterol (PROVENTIL HFA;VENTOLIN HFA) 108 (90 Base) MCG/ACT inhaler Inhale 2 puffs into the lungs every 4 (four) hours as needed for wheezing or shortness of breath. 02/12/17   Ripudeep Jenna Luo, MD  amitriptyline (ELAVIL) 25 MG tablet Take 3 tablets (75 mg total) by mouth at bedtime. 02/26/17   Marzella Schlein  McClung, PA-C  atorvastatin (LIPITOR) 20 MG tablet TAKE 1 TABLET BY MOUTH DAILY AT 6 PM 08/12/16   Josalyn Funches, MD  azithromycin (ZITHROMAX) 250 MG tablet Take 1 tablet (250 mg total) by mouth daily. 02/24/17 02/28/17  Leroy Sea, MD  budesonide-formoterol (SYMBICORT) 80-4.5 MCG/ACT inhaler Take 2 puffs first thing in am and then another 2 puffs about 12 hours later. 12/02/16   Pete Glatter, MD  cloNIDine  (CATAPRES) 0.1 MG tablet Take 1 tablet (0.1 mg total) by mouth 2 (two) times daily. 04/21/16   Rhetta Mura, MD  furosemide (LASIX) 40 MG tablet Take 1 tablet (40 mg total) by mouth 2 (two) times daily. 02/26/17   Anders Simmonds, PA-C  gabapentin (NEURONTIN) 300 MG capsule Take 3 capsules (900 mg total) by mouth 4 (four) times daily. 02/26/17   Anders Simmonds, PA-C  gemfibrozil (LOPID) 600 MG tablet Take 1 tablet (600 mg total) by mouth 2 (two) times daily before a meal. 04/21/16   Rhetta Mura, MD  guaiFENesin-codeine 100-10 MG/5ML syrup Take 5 mLs by mouth 3 (three) times daily as needed for cough. 02/12/17   Ripudeep Jenna Luo, MD  ibuprofen (ADVIL) 200 MG tablet Take 1-2 tablets (200-400 mg total) by mouth every 6 (six) hours as needed for mild pain or moderate pain. Patient taking differently: Take 800 mg by mouth every 6 (six) hours as needed for mild pain or moderate pain.  05/01/16   Lonia Blood, MD  insulin aspart (NOVOLOG) 100 UNIT/ML injection Inject 10 U with breakfast and  lunch, 15 U with dinner Patient taking differently: Inject 10-15 Units into the skin 3 (three) times daily with meals. 10 units with breakfast then 10 units with lunch then 15 units with dinner (evening meal) 10/01/16   Josalyn Funches, MD  insulin glargine (LANTUS) 100 UNIT/ML injection Inject 0.6 mLs (60 Units total) into the skin at bedtime. 10/01/16   Josalyn Funches, MD  ipratropium-albuterol (DUONEB) 0.5-2.5 (3) MG/3ML SOLN Take 3 mLs by nebulization every 6 (six) hours as needed (sob/doe). 12/02/16   Pete Glatter, MD  metFORMIN (GLUCOPHAGE) 1000 MG tablet Take 1 tablet (1,000 mg total) by mouth 2 (two) times daily with a meal. 10/01/16   Dessa Phi, MD  Multiple Vitamin (MULTIVITAMIN WITH MINERALS) TABS tablet Take 1 tablet by mouth daily.    Historical Provider, MD  Potassium Chloride ER 20 MEQ TBCR Take 20 mEq by mouth daily. 02/26/17   Anders Simmonds, PA-C  predniSONE (DELTASONE) 5 MG tablet  Label  & dispense according to the schedule below. 10 Pills PO for 3 days then, 8 Pills PO for 3 days, 6 Pills PO for 3 days, 4 Pills PO for 3 days, 2 Pills PO for 3 days, 1 Pills PO for 3 days, 1/2 Pill  PO for 3 days then STOP. Total 95 pills. 02/24/17   Leroy Sea, MD     Objective:  EXAM:   Vitals:   02/26/17 1126  BP: 123/90  Pulse: 91  Resp: 16  Temp: 98.1 F (36.7 C)  TempSrc: Oral  SpO2: 97%  Weight: 206 lb 9.6 oz (93.7 kg)    General appearance : A&OX3. NAD. Non-toxic-appearing.  Actually appears better than I have seen her previously using O2 at 2L. Obese.  HEENT: Atraumatic and Normocephalic.  PERRLA. EOM intact.  TM clear B. Mouth-MMM, post pharynx WNL w/o erythema, No PND. Neck: supple, no JVD. No cervical lymphadenopathy. No thyromegaly Chest/Lungs:  Breathing-non-labored,  Good air entry bilaterally with mild wheezing throughout, no rales, no rhonchi.   CVS: S1 S2 regular, no murmurs, gallops, rubs  Abdomen: Bowel sounds present, Non tender and not distended with no gaurding, rigidity or rebound. Extremities: Bilateral Lower Ext shows no edema, both legs are warm to touch with = pulse throughout Neurology:  CN II-XII grossly intact, Non focal.   Psych:  TP linear. J/I fair but poor insight ot overall health, compliance, and overall lifelstyle.  Normal speech. Appropriate eye contact and affect.  Skin:  No Rash  Data Review Lab Results  Component Value Date   HGBA1C 8.3 (H) 02/11/2017   HGBA1C 8.2 (H) 02/11/2017   HGBA1C 8.1 02/10/2017     Assessment & Plan   1. Chronic obstructive pulmonary disease with acute exacerbation (HCC) with bronchitis Finish antibiotics and prednisone.  Breathing treatments prn as directed. Continue 2L O2.  Continue current regimen.  Pharmacy trying to switch symbicort to [facility] due to cost.    2. Diabetic polyneuropathy associated with type 2 diabetes mellitus (HCC) - gabapentin (NEURONTIN) 300 MG capsule; Take 3 capsules (900  mg total) by mouth 4 (four) times daily.  Dispense: 360 capsule; Refill: 5 - amitriptyline (ELAVIL) 25 MG tablet; Take 3 tablets (75 mg total) by mouth at bedtime.  Dispense: 90 tablet; Refill: 0 - Comprehensive metabolic panel Continue current glycemic regimen.  Blood sugar is good today.  Reiterated importance of compliance.  Check blood sugar tid and record.   3. Essential hypertension, benign Controlled today -Check CMP today due to some abnormalities during hospital stay that were likely at least partially related to initial poor glycemic control and respiratory distress.  -Comprehensive metabolic panel  4. Chronic diastolic CHF (congestive heart failure) (HCC) Lasix and potassium RF  5. On home oxygen therapy continue  Congratulated on stopping smoking and drinking!   Patient have been counseled extensively about nutrition and exercise  Return in about 4 weeks (around 03/26/2017) for Dr Armen Pickup; DM, htn, COPD, neuropathy.  The patient was given clear instructions to go to ER or return to medical center if symptoms don't improve, worsen or new problems develop. The patient verbalized understanding. The patient was told to call to get lab results if they haven't heard anything in the next week.     Georgian Co, PA-C [facility] [place], [place] [phone]   02/26/2017, 12:30 PMPatient ID: [name], female   DOB: August 17, 1971, 46 y.o.   MRN: [id number]

## 2017-03-03 ENCOUNTER — Telehealth: Payer: Self-pay | Admitting: Family Medicine

## 2017-03-03 NOTE — Telephone Encounter (Signed)
Pt called requesting a letter to take to social services describing her illnesses and disabilities so that she may be approved for food stamps. Stated that she would like to have the letter today, I informed her that there is usually a 24-48 hour turn around time for incoming messages, states that if she doesn't hear anything today she'll just come up here. Please f/u

## 2017-03-03 NOTE — Telephone Encounter (Signed)
Patient came into office requesting letter stating her illnesses and disabilities to get approved for medicaid and food stamps. Patient states this is urgent and wants to see if letter can be created today.   Was informed PCP was not in the office. Pt inquiring if you could create letter since pt has had an appt with you before

## 2017-03-03 NOTE — Telephone Encounter (Signed)
Will route to PCP 

## 2017-03-04 NOTE — Telephone Encounter (Signed)
Please inform patient letter written and ready for pick up  

## 2017-03-05 NOTE — Telephone Encounter (Signed)
Pt was called and informed of letter being ready for pick up. 

## 2017-03-10 ENCOUNTER — Other Ambulatory Visit: Payer: Self-pay

## 2017-03-10 MED ORDER — ALBUTEROL SULFATE HFA 108 (90 BASE) MCG/ACT IN AERS: 2.0000 | INHALATION_SPRAY | RESPIRATORY_TRACT | 3 refills | Status: AC | PRN

## 2017-03-10 MED ORDER — FLUTICASONE FUROATE-VILANTEROL 100-25 MCG/INH IN AEPB: 1.0000 | INHALATION_SPRAY | Freq: Every day | RESPIRATORY_TRACT | 3 refills | Status: DC

## 2017-03-22 ENCOUNTER — Other Ambulatory Visit: Payer: Self-pay

## 2017-03-22 DIAGNOSIS — E118 Type 2 diabetes mellitus with unspecified complications: Principal | ICD-10-CM

## 2017-03-22 DIAGNOSIS — Z794 Long term (current) use of insulin: Principal | ICD-10-CM

## 2017-03-22 DIAGNOSIS — E1165 Type 2 diabetes mellitus with hyperglycemia: Secondary | ICD-10-CM

## 2017-03-22 DIAGNOSIS — IMO0002 Reserved for concepts with insufficient information to code with codable children: Secondary | ICD-10-CM

## 2017-03-22 MED ORDER — INSULIN GLARGINE 100 UNIT/ML ~~LOC~~ SOLN: 60.0000 [IU] | Freq: Every day | SUBCUTANEOUS | 3 refills | Status: DC

## 2017-03-22 MED ORDER — INSULIN LISPRO 100 UNIT/ML (KWIKPEN): PEN_INJECTOR | SUBCUTANEOUS | 3 refills | Status: DC

## 2017-03-22 MED FILL — POTASSIUM CL ER 20 MEQ TAB: 20 | 30 days supply | Qty: 30 | Fill #0

## 2017-03-22 MED FILL — FUROSEMIDE 40 MG TABLET: 40 | 30 days supply | Qty: 60 | Fill #0

## 2017-03-22 MED FILL — PROVENTIL HFA 90 MCG INH: 108 (90 BAS | 15 days supply | Qty: 7 | Fill #0

## 2017-03-22 MED FILL — AMITRIPTYLINE HCL 25 MG TAB: 25 | 30 days supply | Qty: 90 | Fill #0

## 2017-03-25 ENCOUNTER — Telehealth: Payer: Self-pay | Admitting: Family Medicine

## 2017-03-25 MED ORDER — ACETAMINOPHEN-CODEINE #3 300-30 MG PO TABS: 1.0000 | ORAL_TABLET | Freq: Two times a day (BID) | ORAL | 0 refills | Status: DC | PRN

## 2017-03-25 MED ORDER — GUAIFENESIN-CODEINE 100-10 MG/5ML PO SOLN: 5.0000 mL | Freq: Three times a day (TID) | ORAL | 0 refills | Status: DC | PRN

## 2017-03-25 MED FILL — ATORVASTATIN 20 MG TABLET: 20 | 30 days supply | Qty: 30 | Fill #5

## 2017-03-25 MED FILL — !LANTUS 100 UNITS/ML VIAL: 100 | 33 days supply | Qty: 20 | Fill #3

## 2017-03-25 MED FILL — ?METFORMIN HCL 1,000 MG TAB: 1000 | 30 days supply | Qty: 60 | Fill #4

## 2017-03-25 MED FILL — GABAPENTIN 300 MG CAPSULE: 300 | 30 days supply | Qty: 360 | Fill #1

## 2017-03-25 MED FILL — GUAIFENESIN AC COUGH SYRUP: 100-10 | 8 days supply | Qty: 115 | Fill #0

## 2017-03-25 MED FILL — !NOVOLOG 100UNITS/ML VIAL: 100/ML | 28 days supply | Qty: 10 | Fill #2

## 2017-03-25 MED FILL — ?CLONIDINE HCL 0.1 MG TABL: 0.1 | 30 days supply | Qty: 60 | Fill #7

## 2017-03-25 MED FILL — ACETAMINOPHEN/COD #3 TABLET: 300-30 | 15 days supply | Qty: 60 | Fill #0

## 2017-03-25 MED FILL — TRUE METRIX TEST STRIP: 25 days supply | Qty: 100 | Fill #1

## 2017-03-25 NOTE — Telephone Encounter (Signed)
Pt picked up script while in office on 03/25/17

## 2017-03-25 NOTE — Telephone Encounter (Signed)
Prescriptions ready for pick up.

## 2017-03-25 NOTE — Telephone Encounter (Signed)
Pt came in asking for a refill on her Tylenol #4 and Tusanex w/ Codeine to break up her congestion. Please f/u.

## 2017-03-25 NOTE — Telephone Encounter (Signed)
Will route to PCP 

## 2017-03-30 ENCOUNTER — Encounter: Payer: Self-pay | Admitting: Family Medicine

## 2017-03-30 ENCOUNTER — Ambulatory Visit: Payer: Self-pay | Attending: Family Medicine | Admitting: Family Medicine

## 2017-03-30 VITALS — BP 137/86 | HR 69 | Temp 98.2°F | Ht 64.0 in | Wt 208.8 lb

## 2017-03-30 DIAGNOSIS — L97511 Non-pressure chronic ulcer of other part of right foot limited to breakdown of skin: Secondary | ICD-10-CM

## 2017-03-30 DIAGNOSIS — Z7951 Long term (current) use of inhaled steroids: Secondary | ICD-10-CM | POA: Insufficient documentation

## 2017-03-30 DIAGNOSIS — E11622 Type 2 diabetes mellitus with other skin ulcer: Secondary | ICD-10-CM | POA: Insufficient documentation

## 2017-03-30 DIAGNOSIS — E11621 Type 2 diabetes mellitus with foot ulcer: Secondary | ICD-10-CM | POA: Insufficient documentation

## 2017-03-30 DIAGNOSIS — I10 Essential (primary) hypertension: Secondary | ICD-10-CM | POA: Insufficient documentation

## 2017-03-30 DIAGNOSIS — Z794 Long term (current) use of insulin: Secondary | ICD-10-CM | POA: Insufficient documentation

## 2017-03-30 DIAGNOSIS — J42 Unspecified chronic bronchitis: Secondary | ICD-10-CM | POA: Insufficient documentation

## 2017-03-30 DIAGNOSIS — E1165 Type 2 diabetes mellitus with hyperglycemia: Secondary | ICD-10-CM | POA: Insufficient documentation

## 2017-03-30 DIAGNOSIS — L97519 Non-pressure chronic ulcer of other part of right foot with unspecified severity: Secondary | ICD-10-CM | POA: Insufficient documentation

## 2017-03-30 DIAGNOSIS — E1142 Type 2 diabetes mellitus with diabetic polyneuropathy: Secondary | ICD-10-CM | POA: Insufficient documentation

## 2017-03-30 DIAGNOSIS — IMO0002 Reserved for concepts with insufficient information to code with codable children: Secondary | ICD-10-CM

## 2017-03-30 DIAGNOSIS — R062 Wheezing: Secondary | ICD-10-CM | POA: Insufficient documentation

## 2017-03-30 DIAGNOSIS — E118 Type 2 diabetes mellitus with unspecified complications: Secondary | ICD-10-CM

## 2017-03-30 DIAGNOSIS — F17210 Nicotine dependence, cigarettes, uncomplicated: Secondary | ICD-10-CM | POA: Insufficient documentation

## 2017-03-30 DIAGNOSIS — J449 Chronic obstructive pulmonary disease, unspecified: Secondary | ICD-10-CM | POA: Insufficient documentation

## 2017-03-30 DIAGNOSIS — F17201 Nicotine dependence, unspecified, in remission: Secondary | ICD-10-CM

## 2017-03-30 DIAGNOSIS — F1011 Alcohol abuse, in remission: Secondary | ICD-10-CM

## 2017-03-30 DIAGNOSIS — F1010 Alcohol abuse, uncomplicated: Secondary | ICD-10-CM | POA: Insufficient documentation

## 2017-03-30 LAB — GLUCOSE, POCT (MANUAL RESULT ENTRY): POC GLUCOSE: 147 mg/dL — AB (ref 70–99)

## 2017-03-30 MED ORDER — BUDESONIDE-FORMOTEROL FUMARATE 80-4.5 MCG/ACT IN AERO: 2.0000 | INHALATION_SPRAY | Freq: Two times a day (BID) | RESPIRATORY_TRACT | 3 refills | Status: AC

## 2017-03-30 MED ORDER — GUAIFENESIN-CODEINE 100-10 MG/5ML PO SOLN: 10.0000 mL | Freq: Two times a day (BID) | ORAL | 2 refills | Status: AC

## 2017-03-30 MED ORDER — AMITRIPTYLINE HCL 100 MG PO TABS: 100.0000 mg | ORAL_TABLET | Freq: Every day | ORAL | 2 refills | Status: DC

## 2017-03-30 MED ORDER — DOXYCYCLINE HYCLATE 100 MG PO TABS: 100.0000 mg | ORAL_TABLET | Freq: Two times a day (BID) | ORAL | 0 refills | Status: DC

## 2017-03-30 MED ORDER — PREGABALIN 50 MG PO CAPS: 50.0000 mg | ORAL_CAPSULE | Freq: Three times a day (TID) | ORAL | 3 refills | Status: DC

## 2017-03-30 MED FILL — DOXYCYCLINE 100 MG TABLET: 100 | 10 days supply | Qty: 20 | Fill #0

## 2017-03-30 MED FILL — GUAIFENESIN AC COUGH SYRUP: 100-10 | 30 days supply | Qty: 600 | Fill #0

## 2017-03-30 MED FILL — AMITRIPTYLINE HCL 100 MG TA: 100 | 30 days supply | Qty: 30 | Fill #0

## 2017-03-30 MED FILL — !BREO ELLIPTA 100-25 MCG IN: 100-25 | 30 days supply | Qty: 60 | Fill #0

## 2017-03-30 NOTE — Assessment & Plan Note (Signed)
Doxycycline ordered

## 2017-03-30 NOTE — Assessment & Plan Note (Signed)
COPD  No exacerbation Former smoker Continue symbicort Albuterol prn Conservative use of steroids

## 2017-03-30 NOTE — Progress Notes (Signed)
Subjective:  Patient ID: Carla Hicks, female    DOB: [birth year]  Age: 46 y.o. MRN: [id number]  CC: Hypertension; COPD; and Diabetes   HPI Carla Hicks has HTN, DM2, COPD, hx of smoking and alcohol abuse she presents for   1. CHRONIC DIABETES  Disease Monitoring  Blood Sugar Ranges: 120-270   Polyuria: no   Visual problems: no   Medication Compliance: yes  Medication Side Effects  Hypoglycemia: no   She quit drinking alcohol and smoking in 01/2017.   2. Diabetic neuropathy: with pain since since May 2017. Has swelling sensation in R foot and tingling in her foot. She has numbness in both feet. She has sores on the dorsal aspect of her toes on her R foot. There is no redness or drainage. She reports that her regimen of tylenol #3, elavil 75 mg nightly and gabapentin 900 mg 4 times a day is not controlling her pain.   3. COPD: she is a former smoker. She quit after 25 yrs of 1/2 PPD. She takes Symbicort BID. She takes guaifenesin with codeine at night to suppress cough and at times during the day.   Social History  Substance Use Topics  . Smoking status: Current Every Day Smoker    Packs/day: 0.50    Years: 25.00    Types: Cigarettes  . Smokeless tobacco: Never Used  . Alcohol use 0.0 oz/week     Comment: a 40oz or a 12 pack of beer per day 02/10/17    Outpatient Medications Prior to Visit  Medication Sig Dispense Refill  . acetaminophen-codeine (TYLENOL #3) 300-30 MG tablet Take 1-2 tablets by mouth 2 (two) times daily as needed for moderate pain. 60 tablet 0  . albuterol (PROVENTIL HFA;VENTOLIN HFA) 108 (90 Base) MCG/ACT inhaler Inhale 2 puffs into the lungs every 4 (four) hours as needed for wheezing or shortness of breath. 54 g 3  . amitriptyline (ELAVIL) 25 MG tablet Take 3 tablets (75 mg total) by mouth at bedtime. 90 tablet 0  . atorvastatin (LIPITOR) 20 MG tablet TAKE 1 TABLET BY MOUTH DAILY AT 6 PM 30 tablet 5  . budesonide-formoterol (SYMBICORT) 80-4.5  MCG/ACT inhaler Take 2 puffs first thing in am and then another 2 puffs about 12 hours later. 3 Inhaler 3  . cloNIDine (CATAPRES) 0.1 MG tablet Take 1 tablet (0.1 mg total) by mouth 2 (two) times daily. 60 tablet 11  . fluticasone furoate-vilanterol (BREO ELLIPTA) 100-25 MCG/INH AEPB Inhale 1 puff into the lungs daily. 180 each 3  . furosemide (LASIX) 40 MG tablet Take 1 tablet (40 mg total) by mouth 2 (two) times daily. 60 tablet 2  . gabapentin (NEURONTIN) 300 MG capsule Take 3 capsules (900 mg total) by mouth 4 (four) times daily. 360 capsule 5  . gemfibrozil (LOPID) 600 MG tablet Take 1 tablet (600 mg total) by mouth 2 (two) times daily before a meal. 60 tablet 12  . guaiFENesin-codeine 100-10 MG/5ML syrup Take 5 mLs by mouth 3 (three) times daily as needed for cough. 118 mL 0  . ibuprofen (ADVIL) 200 MG tablet Take 1-2 tablets (200-400 mg total) by mouth every 6 (six) hours as needed for mild pain or moderate pain. (Patient taking differently: Take 800 mg by mouth every 6 (six) hours as needed for mild pain or moderate pain. ) 30 tablet 0  . insulin aspart (NOVOLOG) 100 UNIT/ML injection Inject 10 U with breakfast and  lunch, 15 U with dinner (Patient taking differently:  Inject 10-15 Units into the skin 3 (three) times daily with meals. 10 units with breakfast then 10 units with lunch then 15 units with dinner (evening meal)) 10 mL 4  . insulin glargine (LANTUS) 100 UNIT/ML injection Inject 0.6 mLs (60 Units total) into the skin at bedtime. 90 mL 3  . insulin lispro (HUMALOG KWIKPEN) 100 UNIT/ML KiwkPen INJECT 10 U WITH BREAKFAST AND LUNCH, 15 U WITH DINNER. 90 mL 3  . ipratropium-albuterol (DUONEB) 0.5-2.5 (3) MG/3ML SOLN Take 3 mLs by nebulization every 6 (six) hours as needed (sob/doe). 360 mL 3  . metFORMIN (GLUCOPHAGE) 1000 MG tablet Take 1 tablet (1,000 mg total) by mouth 2 (two) times daily with a meal. 180 tablet 3  . Multiple Vitamin (MULTIVITAMIN WITH MINERALS) TABS tablet Take 1 tablet  by mouth daily.    . Potassium Chloride ER 20 MEQ TBCR Take 20 mEq by mouth daily. 30 tablet 2  . predniSONE (DELTASONE) 5 MG tablet Label  & dispense according to the schedule below. 10 Pills PO for 3 days then, 8 Pills PO for 3 days, 6 Pills PO for 3 days, 4 Pills PO for 3 days, 2 Pills PO for 3 days, 1 Pills PO for 3 days, 1/2 Pill  PO for 3 days then STOP. Total 95 pills. 95 tablet 0   Facility-Administered Medications Prior to Visit  Medication Dose Route Frequency Provider Last Rate Last Dose  . ipratropium-albuterol (DUONEB) 0.5-2.5 (3) MG/3ML nebulizer solution 3 mL  3 mL Nebulization Q6H Pete Glatter, MD   3 mL at 12/02/16 1309    ROS Review of Systems  Constitutional: Negative for chills and fever.  Eyes: Negative for visual disturbance.  Respiratory: Positive for cough and shortness of breath.   Cardiovascular: Negative for chest pain and leg swelling.  Gastrointestinal: Negative for abdominal pain and blood in stool.  Endocrine: Negative for polyuria.  Genitourinary: Negative for dysuria.  Musculoskeletal: Positive for myalgias. Negative for arthralgias and back pain.  Skin: Positive for wound. Negative for rash.  Allergic/Immunologic: Negative for immunocompromised state.  Hematological: Negative for adenopathy. Does not bruise/bleed easily.  Psychiatric/Behavioral: Negative for dysphoric mood and suicidal ideas.    Objective:  BP 137/86   Pulse 69   Temp 98.2 F (36.8 C) (Oral)   Ht 5\' 4"  (1.626 m)   Wt 208 lb 12.8 oz (94.7 kg)   SpO2 92%   BMI 35.84 kg/m   BP/Weight 03/30/2017 02/26/2017 02/24/2017  Systolic BP 137 123 137  Diastolic BP 86 90 99  Wt. (Lbs) 208.8 206.6 214.8  BMI 35.84 35.46 -    Physical Exam  Constitutional: She is oriented to person, place, and time. She appears well-developed and well-nourished. No distress.  Obese   HENT:  Head: Normocephalic and atraumatic.  Cardiovascular: Normal rate, regular rhythm, normal heart sounds and intact  distal pulses.   Pulmonary/Chest: Effort normal and breath sounds normal.  Musculoskeletal: She exhibits no edema.       Left lower leg: She exhibits tenderness (no palpable cords ).       Feet:  Neurological: She is alert and oriented to person, place, and time.  Skin: Skin is warm and dry. No rash noted.  Psychiatric: She has a normal mood and affect.    Lab Results  Component Value Date   HGBA1C 8.3 (H) 02/11/2017   CBG 147  Assessment & Plan:   Carla Hicks was seen today for hypertension, copd and diabetes.  Diagnoses and all orders  for this visit:  Uncontrolled type 2 diabetes mellitus with complication, with long-term current use of insulin (HCC) -     POCT glucose (manual entry)  Chronic bronchitis, unspecified chronic bronchitis type (HCC) -     budesonide-formoterol (SYMBICORT) 80-4.5 MCG/ACT inhaler; Inhale 2 puffs into the lungs 2 (two) times daily. Take 2 puffs first thing in am and then another 2 puffs about 12 hours later. -     guaiFENesin-codeine 100-10 MG/5ML syrup; Take 10 mLs by mouth 2 (two) times daily.  Diabetic polyneuropathy associated with type 2 diabetes mellitus (HCC) -     amitriptyline (ELAVIL) 100 MG tablet; Take 1 tablet (100 mg total) by mouth at bedtime. -     pregabalin (LYRICA) 50 MG capsule; Take 1 capsule (50 mg total) by mouth 3 (three) times daily.  Diabetic ulcer of toe of right foot associated with type 2 diabetes mellitus, limited to breakdown of skin (HCC) -     doxycycline (VIBRA-TABS) 100 MG tablet; Take 1 tablet (100 mg total) by mouth 2 (two) times daily.    No orders of the defined types were placed in this encounter.   Follow-up: Return in about 4 weeks (around 04/27/2017) for pap smear .   Dessa Phi MD

## 2017-03-30 NOTE — Assessment & Plan Note (Signed)
A; improving P: Continue current regimen

## 2017-03-30 NOTE — Assessment & Plan Note (Signed)
Persistent neuropathy Plan: Increase elavil to 100 mg nightly lyrica 50 mg TID to replace gabapentin once obtained Will increase to tylenol #4 next month but patient just filled Rx for tylenol #3, so not prescribed at this visit

## 2017-03-30 NOTE — Assessment & Plan Note (Signed)
Recently quit drinking

## 2017-03-30 NOTE — Patient Instructions (Addendum)
Carla Hicks was seen today for hypertension, copd and diabetes.  Diagnoses and all orders for this visit:  Uncontrolled type 2 diabetes mellitus with complication, with long-term current use of insulin (HCC) -     POCT glucose (manual entry)  Chronic bronchitis, unspecified chronic bronchitis type (HCC) -     budesonide-formoterol (SYMBICORT) 80-4.5 MCG/ACT inhaler; Inhale 2 puffs into the lungs 2 (two) times daily. Take 2 puffs first thing in am and then another 2 puffs about 12 hours later. -     guaiFENesin-codeine 100-10 MG/5ML syrup; Take 10 mLs by mouth 2 (two) times daily.  Diabetic polyneuropathy associated with type 2 diabetes mellitus (HCC) -     amitriptyline (ELAVIL) 100 MG tablet; Take 1 tablet (100 mg total) by mouth at bedtime. -     pregabalin (LYRICA) 50 MG capsule; Take 1 capsule (50 mg total) by mouth 3 (three) times daily.  Diabetic ulcer of toe of right foot associated with type 2 diabetes mellitus, limited to breakdown of skin (HCC) -     doxycycline (VIBRA-TABS) 100 MG tablet; Take 1 tablet (100 mg total) by mouth 2 (two) times daily.  drop off lyrica at pharmacy to replace gabapentin  Excellent job quitting smoking and drinking Your blood pressure and sugar are much better  Please call next month for tylenol #4  f/u in  4-6 weeks for pap smear   Dr. Armen Pickup

## 2017-03-30 NOTE — Assessment & Plan Note (Signed)
Patient recently quit smoking  

## 2017-04-09 ENCOUNTER — Other Ambulatory Visit: Payer: Self-pay | Admitting: Family Medicine

## 2017-04-09 DIAGNOSIS — E1142 Type 2 diabetes mellitus with diabetic polyneuropathy: Secondary | ICD-10-CM

## 2017-04-09 MED ORDER — PREGABALIN 50 MG PO CAPS: 50.0000 mg | ORAL_CAPSULE | Freq: Three times a day (TID) | ORAL | 3 refills | Status: AC

## 2017-04-13 IMAGING — US US ABDOMEN LIMITED
1 series · 14 of 25 positions shown · non-contrast
Comparison: 12/11/2014

CLINICAL DATA: Right upper quadrant pain for 2 days

EXAM:
US ABDOMEN LIMITED - RIGHT UPPER QUADRANT

[Series 1: us abdomen limited · 0.22mm/px · 14 of 48 slices shown]
[im 1/48]
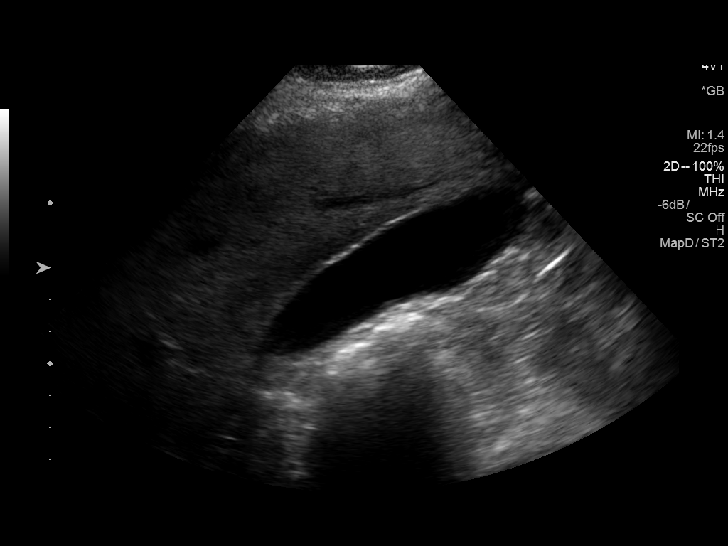
[im 4/48]
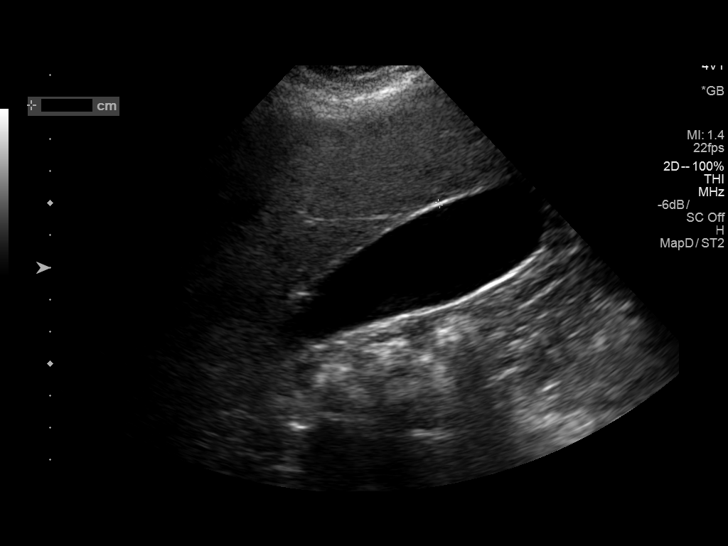
[im 8/48]
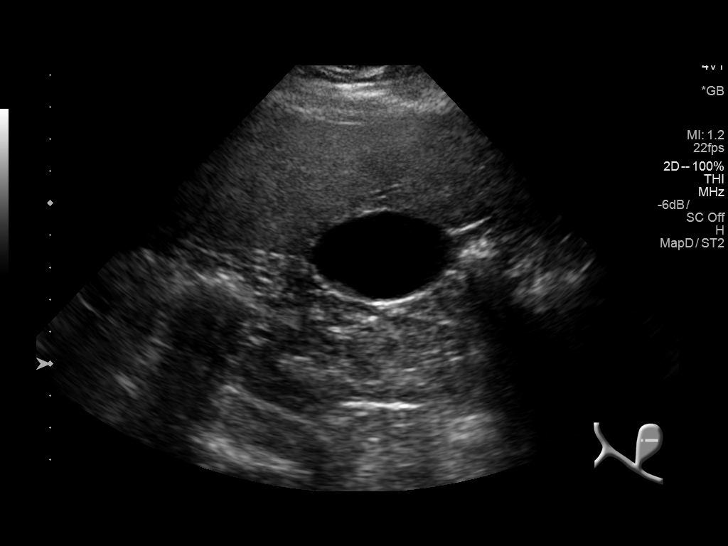
[im 12/48]
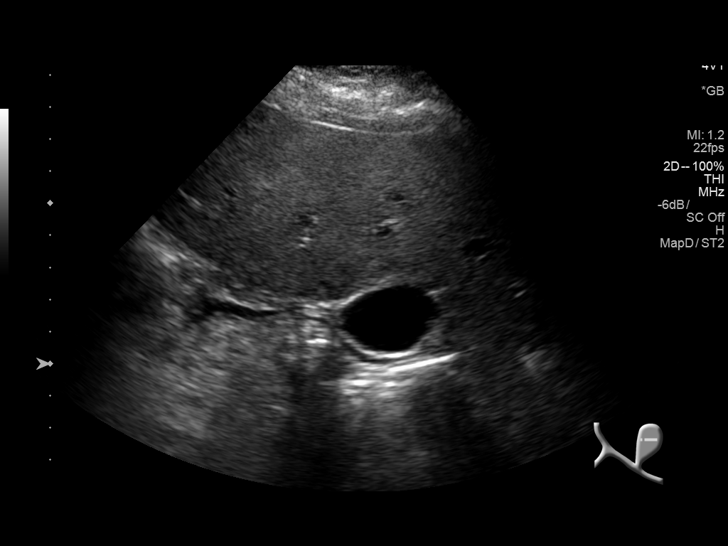
[im 16/48]
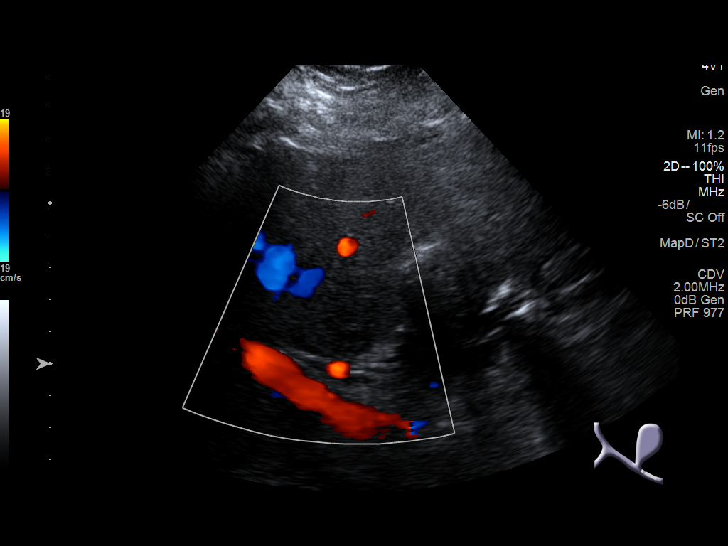
[im 18/48]
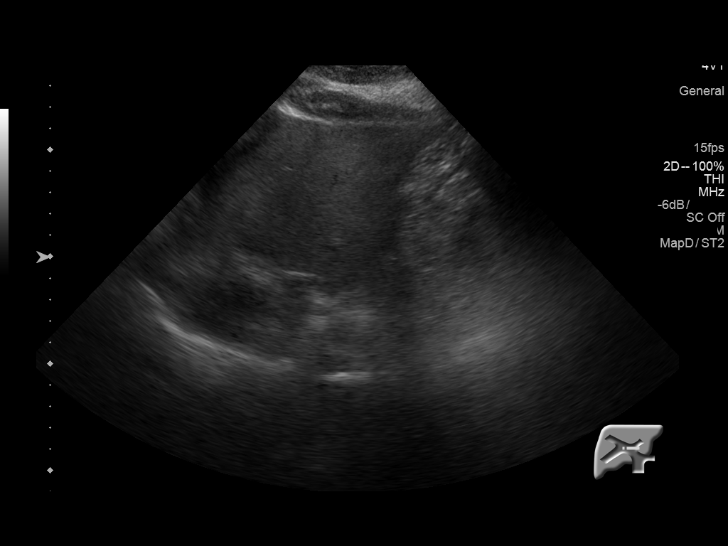
[im 22/48]
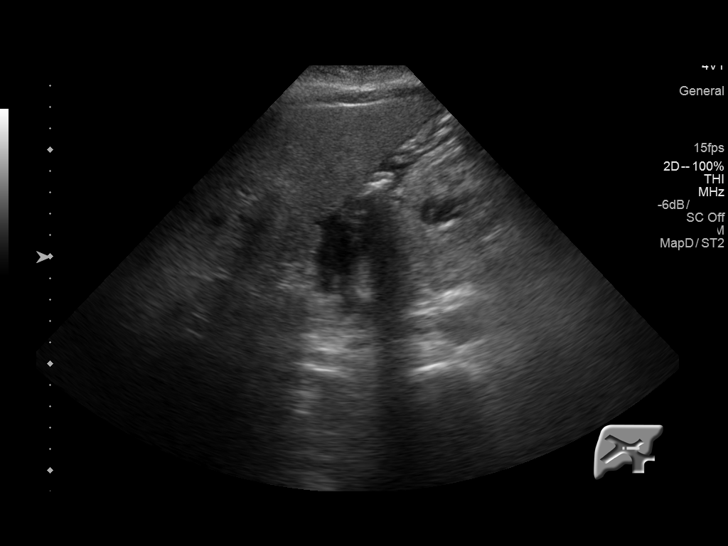
[im 26/48]
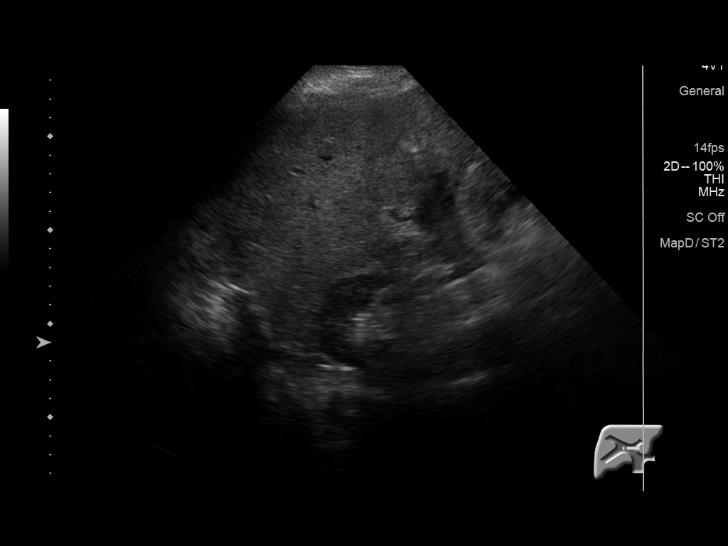
[im 30/48]
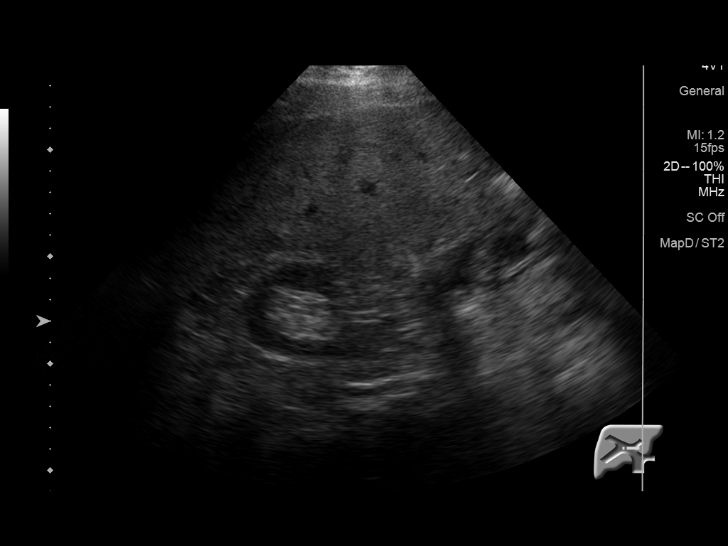
[im 32/48]
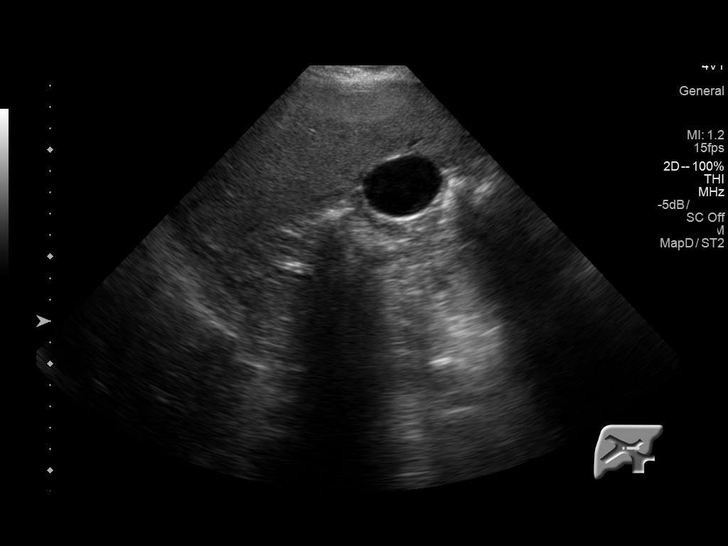
[im 36/48]
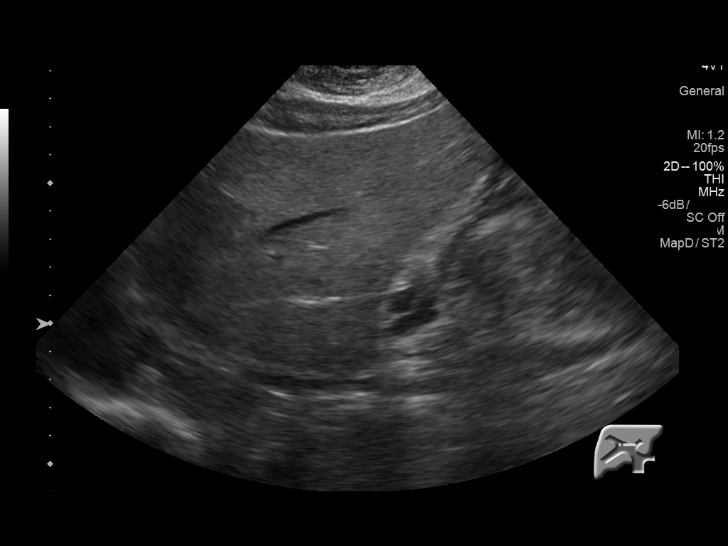
[im 40/48]
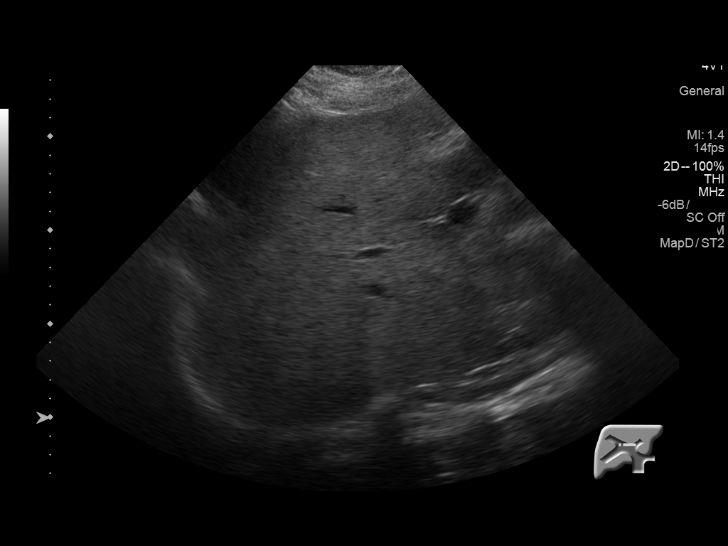
[im 44/48]
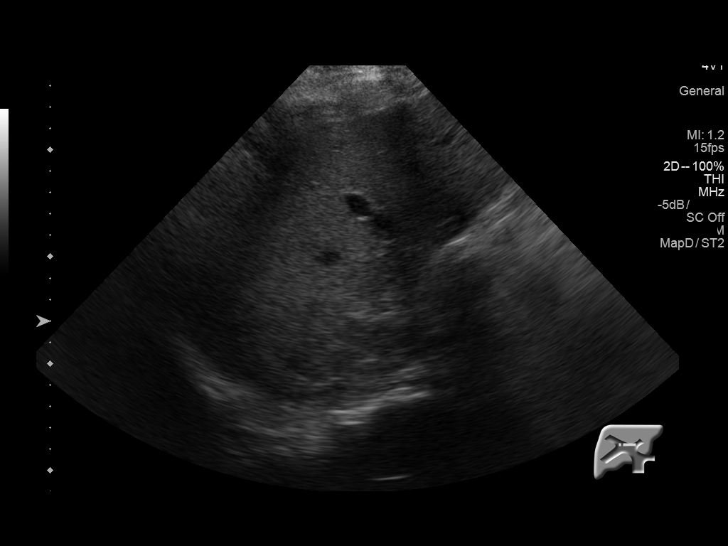
[im 48/48]
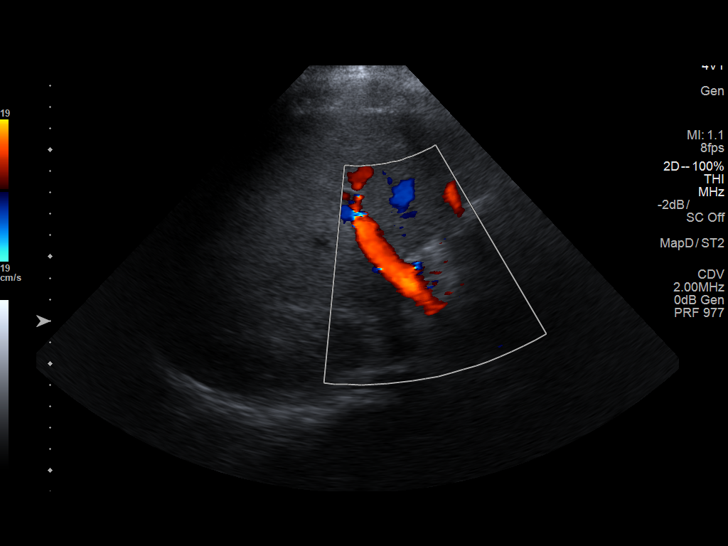

[14 of 25 positions shown; findings below may reference images not displayed]

FINDINGS: Gallbladder:

No gallstones or wall thickening visualized. No sonographic Murphy
sign noted by sonographer.

Common bile duct:

Diameter: 3.4 mm.

Liver:

Mildly increased in echogenicity consistent with fatty infiltration.
IMPRESSION: Fatty liver.  No acute abnormality noted.

## 2017-04-13 MED FILL — !LYRICA 50 MG CAPSULE: 50 MG | 28 days supply | Qty: 84 | Fill #0

## 2017-04-24 ENCOUNTER — Emergency Department (HOSPITAL_COMMUNITY): Payer: Medicaid Other

## 2017-04-24 ENCOUNTER — Encounter (HOSPITAL_COMMUNITY): Payer: Self-pay | Admitting: Emergency Medicine

## 2017-04-24 ENCOUNTER — Inpatient Hospital Stay (HOSPITAL_COMMUNITY)
Admission: EM | Admit: 2017-04-24 | Discharge: 2017-04-27 | DRG: 871 | Disposition: A | Discharge status: Home / Self Care | Payer: Medicaid Other | Attending: Internal Medicine | Admitting: Internal Medicine

## 2017-04-24 DIAGNOSIS — E1165 Type 2 diabetes mellitus with hyperglycemia: Secondary | ICD-10-CM

## 2017-04-24 DIAGNOSIS — I5032 Chronic diastolic (congestive) heart failure: Secondary | ICD-10-CM | POA: Diagnosis present

## 2017-04-24 DIAGNOSIS — R7881 Bacteremia: Secondary | ICD-10-CM

## 2017-04-24 DIAGNOSIS — Z66 Do not resuscitate: Secondary | ICD-10-CM | POA: Diagnosis present

## 2017-04-24 DIAGNOSIS — E669 Obesity, unspecified: Secondary | ICD-10-CM

## 2017-04-24 DIAGNOSIS — J189 Pneumonia, unspecified organism: Secondary | ICD-10-CM | POA: Diagnosis present

## 2017-04-24 DIAGNOSIS — D696 Thrombocytopenia, unspecified: Secondary | ICD-10-CM | POA: Diagnosis present

## 2017-04-24 DIAGNOSIS — K860 Alcohol-induced chronic pancreatitis: Secondary | ICD-10-CM | POA: Diagnosis present

## 2017-04-24 DIAGNOSIS — I272 Pulmonary hypertension, unspecified: Secondary | ICD-10-CM | POA: Diagnosis present

## 2017-04-24 DIAGNOSIS — E1122 Type 2 diabetes mellitus with diabetic chronic kidney disease: Secondary | ICD-10-CM | POA: Diagnosis present

## 2017-04-24 DIAGNOSIS — A4151 Sepsis due to Escherichia coli [E. coli]: Principal | ICD-10-CM

## 2017-04-24 DIAGNOSIS — I130 Hypertensive heart and chronic kidney disease with heart failure and stage 1 through stage 4 chronic kidney disease, or unspecified chronic kidney disease: Secondary | ICD-10-CM | POA: Diagnosis present

## 2017-04-24 DIAGNOSIS — J441 Chronic obstructive pulmonary disease with (acute) exacerbation: Secondary | ICD-10-CM | POA: Diagnosis not present

## 2017-04-24 DIAGNOSIS — I10 Essential (primary) hypertension: Secondary | ICD-10-CM | POA: Diagnosis present

## 2017-04-24 DIAGNOSIS — E1142 Type 2 diabetes mellitus with diabetic polyneuropathy: Secondary | ICD-10-CM | POA: Diagnosis present

## 2017-04-24 DIAGNOSIS — E1110 Type 2 diabetes mellitus with ketoacidosis without coma: Secondary | ICD-10-CM | POA: Diagnosis present

## 2017-04-24 DIAGNOSIS — Z825 Family history of asthma and other chronic lower respiratory diseases: Secondary | ICD-10-CM

## 2017-04-24 DIAGNOSIS — N179 Acute kidney failure, unspecified: Secondary | ICD-10-CM

## 2017-04-24 DIAGNOSIS — Z794 Long term (current) use of insulin: Secondary | ICD-10-CM

## 2017-04-24 DIAGNOSIS — J45901 Unspecified asthma with (acute) exacerbation: Secondary | ICD-10-CM | POA: Diagnosis present

## 2017-04-24 DIAGNOSIS — N182 Chronic kidney disease, stage 2 (mild): Secondary | ICD-10-CM | POA: Diagnosis present

## 2017-04-24 DIAGNOSIS — E119 Type 2 diabetes mellitus without complications: Secondary | ICD-10-CM

## 2017-04-24 DIAGNOSIS — J9621 Acute and chronic respiratory failure with hypoxia: Secondary | ICD-10-CM | POA: Diagnosis present

## 2017-04-24 DIAGNOSIS — N390 Urinary tract infection, site not specified: Secondary | ICD-10-CM

## 2017-04-24 DIAGNOSIS — M25552 Pain in left hip: Secondary | ICD-10-CM

## 2017-04-24 DIAGNOSIS — Z880 Allergy status to penicillin: Secondary | ICD-10-CM

## 2017-04-24 DIAGNOSIS — E1169 Type 2 diabetes mellitus with other specified complication: Secondary | ICD-10-CM

## 2017-04-24 DIAGNOSIS — Z888 Allergy status to other drugs, medicaments and biological substances status: Secondary | ICD-10-CM

## 2017-04-24 DIAGNOSIS — E872 Acidosis: Secondary | ICD-10-CM | POA: Diagnosis present

## 2017-04-24 DIAGNOSIS — Z9981 Dependence on supplemental oxygen: Secondary | ICD-10-CM

## 2017-04-24 DIAGNOSIS — R0602 Shortness of breath: Secondary | ICD-10-CM

## 2017-04-24 DIAGNOSIS — E785 Hyperlipidemia, unspecified: Secondary | ICD-10-CM | POA: Diagnosis present

## 2017-04-24 DIAGNOSIS — Z6836 Body mass index (BMI) 36.0-36.9, adult: Secondary | ICD-10-CM

## 2017-04-24 DIAGNOSIS — E871 Hypo-osmolality and hyponatremia: Secondary | ICD-10-CM | POA: Diagnosis present

## 2017-04-24 DIAGNOSIS — Z8249 Family history of ischemic heart disease and other diseases of the circulatory system: Secondary | ICD-10-CM

## 2017-04-24 DIAGNOSIS — E1159 Type 2 diabetes mellitus with other circulatory complications: Secondary | ICD-10-CM

## 2017-04-24 DIAGNOSIS — IMO0002 Reserved for concepts with insufficient information to code with codable children: Secondary | ICD-10-CM | POA: Diagnosis present

## 2017-04-24 DIAGNOSIS — Z833 Family history of diabetes mellitus: Secondary | ICD-10-CM

## 2017-04-24 DIAGNOSIS — Z79899 Other long term (current) drug therapy: Secondary | ICD-10-CM

## 2017-04-24 DIAGNOSIS — Z7951 Long term (current) use of inhaled steroids: Secondary | ICD-10-CM

## 2017-04-24 DIAGNOSIS — K219 Gastro-esophageal reflux disease without esophagitis: Secondary | ICD-10-CM | POA: Diagnosis present

## 2017-04-24 DIAGNOSIS — E784 Other hyperlipidemia: Secondary | ICD-10-CM | POA: Diagnosis present

## 2017-04-24 DIAGNOSIS — J440 Chronic obstructive pulmonary disease with acute lower respiratory infection: Secondary | ICD-10-CM | POA: Diagnosis present

## 2017-04-24 DIAGNOSIS — A419 Sepsis, unspecified organism: Secondary | ICD-10-CM

## 2017-04-24 DIAGNOSIS — E118 Type 2 diabetes mellitus with unspecified complications: Secondary | ICD-10-CM

## 2017-04-24 DIAGNOSIS — R000 Tachycardia, unspecified: Secondary | ICD-10-CM | POA: Diagnosis present

## 2017-04-24 DIAGNOSIS — B9620 Unspecified Escherichia coli [E. coli] as the cause of diseases classified elsewhere: Secondary | ICD-10-CM

## 2017-04-24 DIAGNOSIS — Z87891 Personal history of nicotine dependence: Secondary | ICD-10-CM

## 2017-04-24 HISTORY — DX: Pure hypercholesterolemia, unspecified: E78.00

## 2017-04-24 LAB — COMPREHENSIVE METABOLIC PANEL
ALT: 31 U/L (ref 14–54)
AST: 73 U/L — AB (ref 15–41)
Albumin: 2.7 g/dL — ABNORMAL LOW (ref 3.5–5.0)
Alkaline Phosphatase: 64 U/L (ref 38–126)
Anion gap: 17 — ABNORMAL HIGH (ref 5–15)
BILIRUBIN TOTAL: 2 mg/dL — AB (ref 0.3–1.2)
BUN: 48 mg/dL — AB (ref 6–20)
CALCIUM: 6.8 mg/dL — AB (ref 8.9–10.3)
CO2: 24 mmol/L (ref 22–32)
CREATININE: 2.52 mg/dL — AB (ref 0.44–1.00)
Chloride: 80 mmol/L — ABNORMAL LOW (ref 101–111)
GFR calc Af Amer: 25 mL/min — ABNORMAL LOW (ref >60)
GFR, EST NON AFRICAN AMERICAN: 22 mL/min — AB (ref >60)
Glucose, Bld: 685 mg/dL (ref 65–99)
Potassium: 4.3 mmol/L (ref 3.5–5.1)
Sodium: 121 mmol/L — ABNORMAL LOW (ref 135–145)
TOTAL PROTEIN: 7.4 g/dL (ref 6.5–8.1)

## 2017-04-24 LAB — CBC WITH DIFFERENTIAL/PLATELET
BASOS ABS: 0 10*3/uL (ref 0.0–0.1)
Basophils Relative: 0 %
Eosinophils Absolute: 0 10*3/uL (ref 0.0–0.7)
Eosinophils Relative: 1 %
HEMATOCRIT: 46 % (ref 36.0–46.0)
Hemoglobin: 16 g/dL — ABNORMAL HIGH (ref 12.0–15.0)
LYMPHS PCT: 12 %
Lymphs Abs: 0.7 10*3/uL (ref 0.7–4.0)
MCH: 30.9 pg (ref 26.0–34.0)
MCHC: 34.8 g/dL (ref 30.0–36.0)
MCV: 88.8 fL (ref 78.0–100.0)
MONO ABS: 0.5 10*3/uL (ref 0.1–1.0)
Monocytes Relative: 9 %
NEUTROS ABS: 4.1 10*3/uL (ref 1.7–7.7)
Neutrophils Relative %: 78 %
Platelets: 87 10*3/uL — ABNORMAL LOW (ref 150–400)
RBC: 5.18 MIL/uL — AB (ref 3.87–5.11)
RDW: 13.2 % (ref 11.5–15.5)
WBC: 5.2 10*3/uL (ref 4.0–10.5)

## 2017-04-24 LAB — BRAIN NATRIURETIC PEPTIDE: B NATRIURETIC PEPTIDE 5: 139.8 pg/mL — AB (ref 0.0–100.0)

## 2017-04-24 LAB — LIPASE, BLOOD: LIPASE: 19 U/L (ref 11–51)

## 2017-04-24 LAB — I-STAT CG4 LACTIC ACID, ED: Lactic Acid, Venous: 3 mmol/L (ref 0.5–1.9)

## 2017-04-24 MED ORDER — IPRATROPIUM BROMIDE 0.02 % IN SOLN
1.0000 mg | Freq: Once | RESPIRATORY_TRACT | Status: AC
  Administered 2017-04-24: 1 mg via RESPIRATORY_TRACT
  Filled 2017-04-24: qty 5

## 2017-04-24 MED ORDER — DEXTROSE 5 % IV SOLN
2.0000 g | Freq: Once | INTRAVENOUS | Status: AC
  Administered 2017-04-24: 2 g via INTRAVENOUS
  Filled 2017-04-24: qty 2

## 2017-04-24 MED ORDER — SODIUM CHLORIDE 0.9 % IV BOLUS (SEPSIS): 1000.0000 mL | Freq: Once | INTRAVENOUS | Status: DC

## 2017-04-24 MED ORDER — SODIUM CHLORIDE 0.9 % IV BOLUS (SEPSIS)
1000.0000 mL | Freq: Once | INTRAVENOUS | Status: AC
  Administered 2017-04-24: 1000 mL via INTRAVENOUS

## 2017-04-24 MED ORDER — METHYLPREDNISOLONE SODIUM SUCC 125 MG IJ SOLR
125.0000 mg | Freq: Once | INTRAMUSCULAR | Status: AC
  Administered 2017-04-24: 125 mg via INTRAVENOUS
  Filled 2017-04-24: qty 2

## 2017-04-24 MED ORDER — FENTANYL CITRATE (PF) 100 MCG/2ML IJ SOLN
INTRAMUSCULAR | Status: AC
  Filled 2017-04-24: qty 2

## 2017-04-24 MED ORDER — ACETAMINOPHEN 500 MG PO TABS
1000.0000 mg | ORAL_TABLET | Freq: Once | ORAL | Status: AC
  Administered 2017-04-24: 1000 mg via ORAL
  Filled 2017-04-24: qty 2

## 2017-04-24 MED ORDER — DEXTROSE 5 % IV SOLN
2.0000 g | INTRAVENOUS | Status: DC
  Administered 2017-04-25: 2 g via INTRAVENOUS
  Filled 2017-04-24: qty 2

## 2017-04-24 MED ORDER — VANCOMYCIN HCL 10 G IV SOLR
1250.0000 mg | INTRAVENOUS | Status: DC
  Administered 2017-04-25: 1250 mg via INTRAVENOUS
  Filled 2017-04-24: qty 1250

## 2017-04-24 MED ORDER — FENTANYL CITRATE (PF) 100 MCG/2ML IJ SOLN
50.0000 ug | Freq: Once | INTRAMUSCULAR | Status: AC
  Administered 2017-04-24: 50 ug via INTRAVENOUS

## 2017-04-24 MED ORDER — ALBUTEROL (5 MG/ML) CONTINUOUS INHALATION SOLN
10.0000 mg/h | INHALATION_SOLUTION | Freq: Once | RESPIRATORY_TRACT | Status: AC
  Administered 2017-04-24: 10 mg/h via RESPIRATORY_TRACT
  Filled 2017-04-24: qty 20

## 2017-04-24 MED ORDER — VANCOMYCIN HCL 500 MG IV SOLR
500.0000 mg | Freq: Once | INTRAVENOUS | Status: DC
  Filled 2017-04-24: qty 500

## 2017-04-24 MED ORDER — VANCOMYCIN HCL IN DEXTROSE 1-5 GM/200ML-% IV SOLN
1000.0000 mg | Freq: Once | INTRAVENOUS | Status: AC
  Administered 2017-04-24: 1000 mg via INTRAVENOUS
  Filled 2017-04-24: qty 200

## 2017-04-24 NOTE — ED Provider Notes (Addendum)
[facility]-EMERGENCY DEPT Provider Note   CSN: [id number] Arrival date & time: 04/24/17  1916     History   Chief Complaint Chief Complaint  Patient presents with  . Shortness of Breath  . Hyperglycemia    HPI Carla Hicks is a 46 y.o. female.  HPI 46 year old female with past medical history of COPD, diabetes, hypertension, who presents with shortness of breath. Patient states that for the last 2-3 days, she has been having subjective fevers and chills. She has had cough but no sputum production. She went to have her breathing test today and states that she had significant difficulty with performing any of the tests. She returned home and approximately one hour later, developed acute on chronic shortness of breath. She endorses severe dyspnea as well as cough and mild bilateral chest pain with coughing. She has associated sharp chest pain when she coughs. No leg swelling. She has not had sputum production. She states she has a history of sepsis and feels similar to how she did at that time. She has not recently been on antibiotics.  Past Medical History:  Diagnosis Date  . Asthma   . COPD (chronic obstructive pulmonary disease) (HCC)   . Diabetes mellitus without complication (HCC)   . Emphysema lung (HCC)   . High cholesterol   . Hypertension     Patient Active Problem List   Diagnosis Date Noted  . COPD exacerbation (HCC) 04/24/2017  . Diabetic ulcer of toe of right foot associated with type 2 diabetes mellitus, limited to breakdown of skin (HCC) 03/30/2017  . On home oxygen therapy 02/26/2017  . Elevated troponin I level 02/23/2017  . Alcohol abuse 02/10/2017  . COPD (chronic obstructive pulmonary disease) (HCC) 02/10/2017  . Tobacco abuse, in remission 02/10/2017  . Pancreatitis 02/10/2017  . Diabetic neuropathy associated with type 2 diabetes mellitus (HCC) 10/01/2016  . Scar of lower leg 08/25/2016  . Calf pain 08/25/2016  . Alcohol abuse, in remission 05/09/2016  .  Chronic diastolic CHF (congestive heart failure) (HCC) 04/30/2016  . Alcohol-induced chronic pancreatitis (HCC)   . Uncontrolled type 2 diabetes mellitus with complication (HCC)   . Pulmonary hypertension (HCC)   . Severe obesity (BMI >= 40) (HCC) 12/28/2015  . Dyslipidemia associated with type 2 diabetes mellitus (HCC) 12/16/2015  . Cough variant asthma vs UACS  07/18/2015  . Essential hypertension, benign 10/24/2014  . Alpha-1-antitrypsin deficiency reported by pt but ruled out 08/01/15 with MM phenotype 10/24/2014    Past Surgical History:  Procedure Laterality Date  . CESAREAN SECTION      OB History    Gravida Para Term Preterm AB Living   5 4 4     4    SAB TAB Ectopic Multiple Live Births                   Home Medications    Prior to Admission medications   Medication Sig Start Date End Date Taking? Authorizing Provider  albuterol (PROVENTIL HFA;VENTOLIN HFA) 108 (90 Base) MCG/ACT inhaler Inhale 2 puffs into the lungs every 4 (four) hours as needed for wheezing or shortness of breath. 03/10/17  Yes Funches, Josalyn, MD  amitriptyline (ELAVIL) 100 MG tablet Take 1 tablet (100 mg total) by mouth at bedtime. 03/30/17  Yes Funches, Josalyn, MD  atorvastatin (LIPITOR) 20 MG tablet TAKE 1 TABLET BY MOUTH DAILY AT 6 PM 08/12/16  Yes Funches, Josalyn, MD  BREO ELLIPTA 100-25 MCG/INH AEPB Take 1 puff by mouth 2 (two)  times daily. 03/30/17  Yes [provider]  cloNIDine (CATAPRES) 0.1 MG tablet Take 1 tablet (0.1 mg total) by mouth 2 (two) times daily. 04/21/16  Yes Rhetta Mura, MD  furosemide (LASIX) 40 MG tablet Take 1 tablet (40 mg total) by mouth 2 (two) times daily. 02/26/17  Yes McClung, Marzella Schlein, PA-C  guaiFENesin-codeine 100-10 MG/5ML syrup Take 10 mLs by mouth 2 (two) times daily. 03/30/17  Yes Funches, Josalyn, MD  insulin aspart (NOVOLOG) 100 UNIT/ML injection Inject 10 U with breakfast and  lunch, 15 U with dinner Patient taking differently: Inject 10-15 Units  into the skin 3 (three) times daily with meals. 10 units with breakfast then 10 units with lunch then 15 units with dinner (evening meal) 10/01/16  Yes Funches, Josalyn, MD  insulin glargine (LANTUS) 100 UNIT/ML injection Inject 0.6 mLs (60 Units total) into the skin at bedtime. 03/22/17  Yes Funches, Josalyn, MD  ipratropium-albuterol (DUONEB) 0.5-2.5 (3) MG/3ML SOLN Take 3 mLs by nebulization every 6 (six) hours as needed (sob/doe). 12/02/16  Yes Pete Glatter, MD  metFORMIN (GLUCOPHAGE) 1000 MG tablet Take 1 tablet (1,000 mg total) by mouth 2 (two) times daily with a meal. 10/01/16  Yes Funches, Josalyn, MD  Multiple Vitamin (MULTIVITAMIN WITH MINERALS) TABS tablet Take 1 tablet by mouth daily.   Yes [provider]  Potassium Chloride ER 20 MEQ TBCR Take 20 mEq by mouth daily. 02/26/17  Yes Anders Simmonds, PA-C  pregabalin (LYRICA) 50 MG capsule Take 1 capsule (50 mg total) by mouth 3 (three) times daily. 04/09/17  Yes Funches, Josalyn, MD  acetaminophen-codeine (TYLENOL #3) 300-30 MG tablet Take 1-2 tablets by mouth 2 (two) times daily as needed for moderate pain. Patient not taking: Reported on 04/24/2017 03/25/17   Dessa Phi, MD  budesonide-formoterol (SYMBICORT) 80-4.5 MCG/ACT inhaler Inhale 2 puffs into the lungs 2 (two) times daily. Take 2 puffs first thing in am and then another 2 puffs about 12 hours later. Patient not taking: Reported on 04/24/2017 03/30/17   Dessa Phi, MD  doxycycline (VIBRA-TABS) 100 MG tablet Take 1 tablet (100 mg total) by mouth 2 (two) times daily. Patient not taking: Reported on 04/24/2017 03/30/17   Dessa Phi, MD  gabapentin (NEURONTIN) 300 MG capsule Take 3 capsules (900 mg total) by mouth 4 (four) times daily. Patient not taking: Reported on 04/24/2017 02/26/17   Anders Simmonds, PA-C  gemfibrozil (LOPID) 600 MG tablet Take 1 tablet (600 mg total) by mouth 2 (two) times daily before a meal. Patient not taking: Reported on 04/24/2017 04/21/16    Rhetta Mura, MD  ibuprofen (ADVIL) 200 MG tablet Take 1-2 tablets (200-400 mg total) by mouth every 6 (six) hours as needed for mild pain or moderate pain. Patient not taking: Reported on 04/24/2017 05/01/16   Lonia Blood, MD  insulin lispro (HUMALOG KWIKPEN) 100 UNIT/ML KiwkPen INJECT 10 U WITH BREAKFAST AND LUNCH, 15 U WITH DINNER. Patient not taking: Reported on 04/24/2017 03/22/17   Dessa Phi, MD    Family History Family History  Problem Relation Age of Onset  . Emphysema Mother     smoked  . Allergies Mother   . Asthma Mother   . Breast cancer Mother   . Uterine cancer Mother   . Diabetes Father   . Heart disease Maternal Grandmother     Social History Social History  Substance Use Topics  . Smoking status: Former Smoker    Packs/day: 0.50    Years: 25.00  Types: Cigarettes  . Smokeless tobacco: Never Used  . Alcohol use No     Comment: a 40oz or a 12 pack of beer per day 02/10/17. 04/24/17 Pt reports she quit      Allergies   Penicillins and Robitussin liquid center [menthol]   Review of Systems Review of Systems  Constitutional: Positive for chills, fatigue and fever.  Respiratory: Positive for cough, shortness of breath and wheezing.   Cardiovascular: Positive for chest pain.  All other systems reviewed and are negative.    Physical Exam Updated Vital Signs BP 101/66 (BP Location: Right Arm)   Pulse (!) 113   Temp (!) 103.1 F (39.5 C) (Rectal)   Resp 18   LMP  (LMP Unknown)   SpO2 96%   Physical Exam  Constitutional: She is oriented to person, place, and time. She appears well-developed and well-nourished. She appears distressed.  HENT:  Head: Normocephalic and atraumatic.  Dry MM  Eyes: Conjunctivae are normal.  Neck: Neck supple.  Cardiovascular: Regular rhythm and normal heart sounds.  Tachycardia present.  Exam reveals no friction rub.   No murmur heard. Pulmonary/Chest: Accessory muscle usage present. Tachypnea noted. No  respiratory distress. She has decreased breath sounds. She has wheezes in the right upper field, the right middle field, the right lower field, the left upper field, the left middle field and the left lower field. She has no rales.  Abdominal: She exhibits no distension.  Musculoskeletal: She exhibits no edema.  Neurological: She is alert and oriented to person, place, and time. She exhibits normal muscle tone.  Skin: Skin is warm. Capillary refill takes less than 2 seconds.  Psychiatric: She has a normal mood and affect.  Nursing note and vitals reviewed.    ED Treatments / Results  Labs (all labs ordered are listed, but only abnormal results are displayed) Labs Reviewed  CBC WITH DIFFERENTIAL/PLATELET - Abnormal; Notable for the following:       Result Value   RBC 5.18 (*)    Hemoglobin 16.0 (*)    Platelets 87 (*)    All other components within normal limits  COMPREHENSIVE METABOLIC PANEL - Abnormal; Notable for the following:    Sodium 121 (*)    Chloride 80 (*)    Glucose, Bld 685 (*)    BUN 48 (*)    Creatinine, Ser 2.52 (*)    Calcium 6.8 (*)    Albumin 2.7 (*)    AST 73 (*)    Total Bilirubin 2.0 (*)    GFR calc non Af Amer 22 (*)    GFR calc Af Amer 25 (*)    Anion gap 17 (*)    All other components within normal limits  BRAIN NATRIURETIC PEPTIDE - Abnormal; Notable for the following:    B Natriuretic Peptide 139.8 (*)    All other components within normal limits  I-STAT CG4 LACTIC ACID, ED - Abnormal; Notable for the following:    Lactic Acid, Venous 3.00 (*)    All other components within normal limits  CULTURE, BLOOD (ROUTINE X 2)  CULTURE, BLOOD (ROUTINE X 2)  URINE CULTURE  URINALYSIS, ROUTINE W REFLEX MICROSCOPIC  INFLUENZA PANEL BY PCR (TYPE A & B)  CREATININE, SERUM  LIPASE, BLOOD  TROPONIN I  TROPONIN I  TROPONIN I    EKG  EKG Interpretation  Date/Time:  Saturday Apr 24 2017 20:02:37 EDT Ventricular Rate:  108 PR Interval:    QRS  Duration: 170 QT Interval:  398 QTC Calculation: 534 R Axis:   -64 Text Interpretation:  Sinus tachycardia RBBB and LAFB Probable left ventricular hypertrophy No change v. 02/23/17 Confirmed by Fayrene Fearing  MD, MARK ([id number]) on 04/24/2017 9:12:27 PM       Radiology No results found.  Procedures Procedures (including critical care time)  Medications Ordered in ED Medications  vancomycin (VANCOCIN) IVPB 1000 mg/200 mL premix (1,000 mg Intravenous New Bag/Given 04/24/17 2153)  ceFEPIme (MAXIPIME) 2 g in dextrose 5 % 50 mL IVPB (not administered)  vancomycin (VANCOCIN) 500 mg in sodium chloride 0.9 % 100 mL IVPB (not administered)  vancomycin (VANCOCIN) 1,250 mg in sodium chloride 0.9 % 250 mL IVPB (not administered)  fentaNYL (SUBLIMAZE) injection 50 mcg (not administered)  albuterol (PROVENTIL,VENTOLIN) solution continuous neb (10 mg/hr Nebulization Given 04/24/17 2046)  ipratropium (ATROVENT) nebulizer solution 1 mg (1 mg Nebulization Given 04/24/17 2045)  sodium chloride 0.9 % bolus 1,000 mL (1,000 mLs Intravenous New Bag/Given 04/24/17 2014)  sodium chloride 0.9 % bolus 1,000 mL (0 mLs Intravenous Stopped 04/24/17 2140)    And  sodium chloride 0.9 % bolus 1,000 mL (0 mLs Intravenous Stopped 04/24/17 2232)  ceFEPIme (MAXIPIME) 2 g in dextrose 5 % 50 mL IVPB (0 g Intravenous Stopped 04/24/17 2146)  acetaminophen (TYLENOL) tablet 1,000 mg (1,000 mg Oral Given 04/24/17 2055)  methylPREDNISolone sodium succinate (SOLU-MEDROL) 125 mg/2 mL injection 125 mg (125 mg Intravenous Given 04/24/17 2055)     Initial Impression / Assessment and Plan / ED Course  I have reviewed the triage vital signs and the nursing notes.  Pertinent labs & imaging results that were available during my care of the patient were reviewed by me and considered in my medical decision making (see chart for details).     46 year old female with history of COPD and chronic hypoxic respiratory failure here with acute on chronic shortness of  breath. On arrival, patient is febrile, tachycardic, and borderline hypotensive. Labwork shows significant acute kidney injury, likely prerenal, as well as hyperglycemia. Bicarb 24, however, with AG of 17 which is explained by lactic acidosis - no signs of DKA but will need to be monitored closely. She has a lactic acid of 3.00. Code sepsis initiated with broad-spectrum ABX given recent hospitalization. Chest x-ray shows bibasilar atelectasis. Patient feels improved s/p APAP, breathing tx, solumedrol, and fluids. Admit to Hospitalist. Of note, tp also with thrombocytopenia - will need to be trended. Given degree of sepsis and reported vomiting, will check ct as well though abdomen o/w soft. Suspect pulmonary etiology, but urinary also on DDx - UA pending, pt has been appropriately covered. No obstructing stone on my review of CT. Hospitalist aware. IVF running, Pt improved on exam.  Final Clinical Impressions(s) / ED Diagnoses   Final diagnoses:  COPD exacerbation (HCC)  Sepsis, due to unspecified organism (HCC)  Thrombocytopenia (HCC)      Shaune Pollack, MD 04/24/17 2240    Shaune Pollack, MD 04/25/17 [phone]

## 2017-04-24 NOTE — ED Triage Notes (Signed)
Per EMS pt from home c/o SOB pt states she ahd a "breathing test" earlier today and has taken home nebulizer w/ no relief. Pt received duo ned in route to facility. Pt respiratory rate normal unlabored. Pt hx COPD 2L [place] at home at all times. Pt AOx4.

## 2017-04-24 NOTE — ED Notes (Signed)
Bed: AV40 Expected date:  Expected time:  Means of arrival:  Comments: 46 yo SOB- neb given

## 2017-04-24 NOTE — ED Notes (Signed)
Respiratory called for pt-will be down to administer breathing treatments.

## 2017-04-24 NOTE — Progress Notes (Signed)
  Pharmacy Antibiotic Note  Carla Hicks is a 46 y.o. female admitted on 04/24/2017 with shortness of breath, cough, fever, and elevated lactic acid. Pharmacy has been consulted for Vancomycin and Cefepime dosing for sepsis. Recent weight 95kg. SCr is elevated, CrCl ~26ml/min.  Plan: Cefepime 2g IV q24h. Vancomycin 1.5g IV x 1 then 1250mg  IV q24h. (Goal trough 15-42mcg/ml) Measure Vanc trough at steady state. Follow up renal fxn, culture results, and clinical course.     Temp (24hrs), Avg:101.1 F (38.4 C), Min:99 F (37.2 C), Max:103.1 F (39.5 C)   Recent Labs Lab 04/24/17 1954 04/24/17 2024  WBC 5.2  --   CREATININE 2.52*  --   LATICACIDVEN  --  3.00*    CrCl cannot be calculated (Unknown ideal weight.).    Allergies  Allergen Reactions  . Penicillins Hives    +++++++Has tolerated cephalosporins++++++++ Has patient had a PCN reaction causing immediate rash, facial/tongue/throat swelling, SOB or lightheadedness with hypotension: Yes Has patient had a PCN reaction causing severe rash involving mucus membranes or skin necrosis: No Has patient had a PCN reaction that required hospitalization: Yes Has patient had a PCN reaction occurring within the last 10 years: No If all of the above answers are "NO", then may proceed with Cephalosporin use.   . Robitussin Liquid Center [Menthol] Hives    Antimicrobials this admission: Vanc 5/5 >>  Cefepime 5/5 >>   Dose adjustments this admission:   Microbiology results: BCx: UCx:  Sputum: MRSA PCR:  Thank you for allowing pharmacy to be a part of this patient's care.  Charolotte Eke, PharmD, pager [phone]. 04/24/2017,9:05 PM.

## 2017-04-24 NOTE — H&P (Signed)
TRH H&P   Patient Demographics:    Sherilyn Windhorst, is a 46 y.o. female  MRN: [id number]   DOB - 1971-09-21  Admit Date - 04/24/2017  Outpatient Primary MD for the patient is Dessa Phi, MD  Referring MD/NP/PA:   Shaune Pollack  Outpatient Specialists:     Patient coming from: home  Chief Complaint  Patient presents with  . Shortness of Breath  . Hyperglycemia      HPI:    Kenniyah Sasaki  is a 46 y.o. female, w Copd on home o2, apparently c/o going to PFT this am, and then became more dyspneic.  +Wheezing, + cough w yellow sputum.   and took albuterol tx at home and then took duoneb without relief and therefore came to ED for evaluation.     In ED, CXR result pending, slight haziness over the left lower lung.    Pt tachycardic, and lactic acid elevated at 3.0  ,  Pt febrile to 103.  Pt will be admitted for possible sepsis,  Copd exacerbation.    Review of systems:    In addition to the HPI above, No Fever-chills, No Headache, No changes with Vision or hearing, No problems swallowing food or Liquids, No Chest pain,  No Abdominal pain, No Nausea or Vommitting, Bowel movements are regular, No Blood in stool or Urine, No dysuria, No new skin rashes or bruises, No new joints pains-aches,  No new weakness, tingling, numbness in any extremity, No recent weight gain or loss, No polyuria, polydypsia or polyphagia, No significant Mental Stressors.  A full 10 point Review of Systems was done, except as stated above, all other Review of Systems were negative.   With Past History of the following :    Past Medical History:  Diagnosis Date  . Asthma   . COPD (chronic obstructive pulmonary disease) (HCC)   . Diabetes mellitus without complication (HCC)   . Emphysema lung (HCC)   . High cholesterol   . Hypertension       Past Surgical History:  Procedure  Laterality Date  . CESAREAN SECTION        Social History:     Social History  Substance Use Topics  . Smoking status: Former Smoker    Packs/day: 0.50    Years: 25.00    Types: Cigarettes  . Smokeless tobacco: Never Used  . Alcohol use No     Comment: a 40oz or a 12 pack of beer per day 02/10/17. 04/24/17 Pt reports she quit      Lives - at home  Mobility -  Typically walks without assistance   Family History :     Family History  Problem Relation Age of Onset  . Emphysema Mother     smoked  . Allergies Mother   . Asthma Mother   . Breast cancer Mother   . Uterine cancer  Mother   . Diabetes Father   . Heart disease Maternal Grandmother       Home Medications:   Prior to Admission medications   Medication Sig Start Date End Date Taking? Authorizing Provider  albuterol (PROVENTIL HFA;VENTOLIN HFA) 108 (90 Base) MCG/ACT inhaler Inhale 2 puffs into the lungs every 4 (four) hours as needed for wheezing or shortness of breath. 03/10/17  Yes Funches, Josalyn, MD  amitriptyline (ELAVIL) 100 MG tablet Take 1 tablet (100 mg total) by mouth at bedtime. 03/30/17  Yes Funches, Josalyn, MD  atorvastatin (LIPITOR) 20 MG tablet TAKE 1 TABLET BY MOUTH DAILY AT 6 PM 08/12/16  Yes Funches, Josalyn, MD  BREO ELLIPTA 100-25 MCG/INH AEPB Take 1 puff by mouth 2 (two) times daily. 03/30/17  Yes [provider]  cloNIDine (CATAPRES) 0.1 MG tablet Take 1 tablet (0.1 mg total) by mouth 2 (two) times daily. 04/21/16  Yes Rhetta Mura, MD  furosemide (LASIX) 40 MG tablet Take 1 tablet (40 mg total) by mouth 2 (two) times daily. 02/26/17  Yes McClung, Marzella Schlein, PA-C  guaiFENesin-codeine 100-10 MG/5ML syrup Take 10 mLs by mouth 2 (two) times daily. 03/30/17  Yes Funches, Josalyn, MD  insulin aspart (NOVOLOG) 100 UNIT/ML injection Inject 10 U with breakfast and  lunch, 15 U with dinner Patient taking differently: Inject 10-15 Units into the skin 3 (three) times daily with meals. 10 units  with breakfast then 10 units with lunch then 15 units with dinner (evening meal) 10/01/16  Yes Funches, Josalyn, MD  insulin glargine (LANTUS) 100 UNIT/ML injection Inject 0.6 mLs (60 Units total) into the skin at bedtime. 03/22/17  Yes Funches, Josalyn, MD  ipratropium-albuterol (DUONEB) 0.5-2.5 (3) MG/3ML SOLN Take 3 mLs by nebulization every 6 (six) hours as needed (sob/doe). 12/02/16  Yes Pete Glatter, MD  metFORMIN (GLUCOPHAGE) 1000 MG tablet Take 1 tablet (1,000 mg total) by mouth 2 (two) times daily with a meal. 10/01/16  Yes Funches, Josalyn, MD  Multiple Vitamin (MULTIVITAMIN WITH MINERALS) TABS tablet Take 1 tablet by mouth daily.   Yes [provider]  Potassium Chloride ER 20 MEQ TBCR Take 20 mEq by mouth daily. 02/26/17  Yes Anders Simmonds, PA-C  pregabalin (LYRICA) 50 MG capsule Take 1 capsule (50 mg total) by mouth 3 (three) times daily. 04/09/17  Yes Funches, Josalyn, MD  acetaminophen-codeine (TYLENOL #3) 300-30 MG tablet Take 1-2 tablets by mouth 2 (two) times daily as needed for moderate pain. Patient not taking: Reported on 04/24/2017 03/25/17   Dessa Phi, MD  budesonide-formoterol (SYMBICORT) 80-4.5 MCG/ACT inhaler Inhale 2 puffs into the lungs 2 (two) times daily. Take 2 puffs first thing in am and then another 2 puffs about 12 hours later. Patient not taking: Reported on 04/24/2017 03/30/17   Dessa Phi, MD  doxycycline (VIBRA-TABS) 100 MG tablet Take 1 tablet (100 mg total) by mouth 2 (two) times daily. Patient not taking: Reported on 04/24/2017 03/30/17   Dessa Phi, MD  gabapentin (NEURONTIN) 300 MG capsule Take 3 capsules (900 mg total) by mouth 4 (four) times daily. Patient not taking: Reported on 04/24/2017 02/26/17   Anders Simmonds, PA-C  gemfibrozil (LOPID) 600 MG tablet Take 1 tablet (600 mg total) by mouth 2 (two) times daily before a meal. Patient not taking: Reported on 04/24/2017 04/21/16   Rhetta Mura, MD  ibuprofen (ADVIL) 200 MG tablet  Take 1-2 tablets (200-400 mg total) by mouth every 6 (six) hours as needed for mild pain or moderate  pain. Patient not taking: Reported on 04/24/2017 05/01/16   Lonia Blood, MD  insulin lispro (HUMALOG KWIKPEN) 100 UNIT/ML KiwkPen INJECT 10 U WITH BREAKFAST AND LUNCH, 15 U WITH DINNER. Patient not taking: Reported on 04/24/2017 03/22/17   Dessa Phi, MD     Allergies:     Allergies  Allergen Reactions  . Penicillins Hives    +++++++Has tolerated cephalosporins++++++++ Has patient had a PCN reaction causing immediate rash, facial/tongue/throat swelling, SOB or lightheadedness with hypotension: Yes Has patient had a PCN reaction causing severe rash involving mucus membranes or skin necrosis: No Has patient had a PCN reaction that required hospitalization: Yes Has patient had a PCN reaction occurring within the last 10 years: No If all of the above answers are "NO", then may proceed with Cephalosporin use.   . Robitussin Liquid Center [Menthol] Hives     Physical Exam:   Vitals  Blood pressure 101/66, pulse (!) 113, temperature (!) 103.1 F (39.5 C), temperature source Rectal, resp. rate 18, SpO2 96 %.   1. General lying in bed in NAD,    2. Normal affect and insight, Not Suicidal or Homicidal, Awake Alert, Oriented X 3.  3. No F.N deficits, ALL C.Nerves Intact, Strength 5/5 all 4 extremities, Sensation intact all 4 extremities, Plantars down going.  4. Ears and Eyes appear Normal, Conjunctivae clear, PERRLA. Moist Oral Mucosa.  5. Supple Neck, No JVD, No cervical lymphadenopathy appriciated, No Carotid Bruits.  6. Symmetrical Chest wall movement, Bilateral wheezing, no crackles  7. RRR, No Gallops, Rubs or Murmurs, No Parasternal Heave.  8. Positive Bowel Sounds, Abdomen Soft, No tenderness, No organomegaly appriciated,No rebound -guarding or rigidity.  9.  No Cyanosis, Normal Skin Turgor, No Skin Rash or Bruise.  10. Good muscle tone,  joints appear normal , no  effusions, Normal ROM.  11. No Palpable Lymph Nodes in Neck or Axillae     Data Review:    CBC  Recent Labs Lab 04/24/17 1954  WBC 5.2  HGB 16.0*  HCT 46.0  PLT 87*  MCV 88.8  MCH 30.9  MCHC 34.8  RDW 13.2  LYMPHSABS 0.7  MONOABS 0.5  EOSABS 0.0  BASOSABS 0.0   ------------------------------------------------------------------------------------------------------------------  Chemistries   Recent Labs Lab 04/24/17 1954  NA 121*  K 4.3  CL 80*  CO2 24  GLUCOSE 685*  BUN 48*  CREATININE 2.52*  CALCIUM 6.8*  AST 73*  ALT 31  ALKPHOS 64  BILITOT 2.0*   ------------------------------------------------------------------------------------------------------------------ CrCl cannot be calculated (Unknown ideal weight.). ------------------------------------------------------------------------------------------------------------------ No results for input(s): TSH, T4TOTAL, T3FREE, THYROIDAB in the last 72 hours.  Invalid input(s): FREET3  Coagulation profile No results for input(s): INR, PROTIME in the last 168 hours. ------------------------------------------------------------------------------------------------------------------- No results for input(s): DDIMER in the last 72 hours. -------------------------------------------------------------------------------------------------------------------  Cardiac Enzymes No results for input(s): CKMB, TROPONINI, MYOGLOBIN in the last 168 hours.  Invalid input(s): CK ------------------------------------------------------------------------------------------------------------------    Component Value Date/Time   BNP 139.8 (H) 04/24/2017 1954     ---------------------------------------------------------------------------------------------------------------  Urinalysis    Component Value Date/Time   COLORURINE YELLOW 02/11/2017 0536   APPEARANCEUR HAZY (A) 02/11/2017 0536   LABSPEC 1.013 02/11/2017 0536    PHURINE 5.0 02/11/2017 0536   GLUCOSEU >=500 (A) 02/11/2017 0536   HGBUR NEGATIVE 02/11/2017 0536   BILIRUBINUR NEGATIVE 02/11/2017 0536   BILIRUBINUR negative 11/06/2016 1700   KETONESUR 5 (A) 02/11/2017 0536   PROTEINUR NEGATIVE 02/11/2017 0536   UROBILINOGEN 0.2 11/06/2016 1700   UROBILINOGEN 0.2 12/11/2014  1522   NITRITE NEGATIVE 02/11/2017 0536   LEUKOCYTESUR SMALL (A) 02/11/2017 0536    ----------------------------------------------------------------------------------------------------------------   Imaging Results:    No results found. EKG st at 108, LAD axis, RBBB and LAFB   Assessment & Plan:    Principal Problem:   COPD exacerbation (HCC) Active Problems:   Essential hypertension, benign   Uncontrolled type 2 diabetes mellitus with complication (HCC)   Tachycardia    1. Sepsis  ? Unclear source Blood culture x2,  vanco iv x1  => stopped due to ? Redness in face Cefepime 2gm x1  2.  Copd exacerbation Solumedrol 80mg  iv q8h spiriva 1puff qday Albuterol neb q6h  And q6h prn   3.  Tachycardia Trop I q6h x3.   4. Dm2 fsbs ac and qhs ISS  5. Protein calorie malnutrition Consider nutrition consult     DVT Prophylaxis Heparin -  - SCDs   AM Labs Ordered, also please review Full Orders  Family Communication: Admission, patients condition and plan of care including tests being ordered have been discussed with the patient who indicate understanding and agree with the plan and Code Status.  Code Status FULL CODE  Likely DC to  home  Condition GUARDED    Consults called:  none  Admission status: inpatient  Time spent in minutes : 45 minutes   Pearson Grippe M.D on 04/24/2017 at 10:53 PM  Between 7am to 7pm - Pager - [phone]. After 7pm go to [email or web address] - password [facility]  [facility] - Office  [phone]

## 2017-04-25 DIAGNOSIS — N183 Chronic kidney disease, stage 3 (moderate): Secondary | ICD-10-CM

## 2017-04-25 DIAGNOSIS — N179 Acute kidney failure, unspecified: Secondary | ICD-10-CM

## 2017-04-25 DIAGNOSIS — E1110 Type 2 diabetes mellitus with ketoacidosis without coma: Secondary | ICD-10-CM

## 2017-04-25 DIAGNOSIS — I10 Essential (primary) hypertension: Secondary | ICD-10-CM

## 2017-04-25 DIAGNOSIS — J441 Chronic obstructive pulmonary disease with (acute) exacerbation: Secondary | ICD-10-CM

## 2017-04-25 DIAGNOSIS — A419 Sepsis, unspecified organism: Secondary | ICD-10-CM

## 2017-04-25 DIAGNOSIS — E871 Hypo-osmolality and hyponatremia: Secondary | ICD-10-CM

## 2017-04-25 LAB — URINALYSIS, ROUTINE W REFLEX MICROSCOPIC
Bilirubin Urine: NEGATIVE
Glucose, UA: >=500 mg/dL — AB
Ketones, ur: 20 mg/dL — AB
NITRITE: POSITIVE — AB
PH: 5 (ref 5.0–8.0)
Protein, ur: 100 mg/dL — AB
SPECIFIC GRAVITY, URINE: 1.01 (ref 1.005–1.030)

## 2017-04-25 LAB — CBC
HEMATOCRIT: 30.5 % — AB (ref 36.0–46.0)
HEMOGLOBIN: 10.3 g/dL — AB (ref 12.0–15.0)
MCH: 30.4 pg (ref 26.0–34.0)
MCHC: 33.8 g/dL (ref 30.0–36.0)
MCV: 90 fL (ref 78.0–100.0)
Platelets: 131 10*3/uL — ABNORMAL LOW (ref 150–400)
RBC: 3.39 MIL/uL — AB (ref 3.87–5.11)
RDW: 13.4 % (ref 11.5–15.5)
WBC: 7.2 10*3/uL (ref 4.0–10.5)

## 2017-04-25 LAB — COMPREHENSIVE METABOLIC PANEL
ALT: 31 U/L (ref 14–54)
ANION GAP: 19 — AB (ref 5–15)
AST: 75 U/L — ABNORMAL HIGH (ref 15–41)
Albumin: 2.5 g/dL — ABNORMAL LOW (ref 3.5–5.0)
Alkaline Phosphatase: 62 U/L (ref 38–126)
BILIRUBIN TOTAL: 1.9 mg/dL — AB (ref 0.3–1.2)
BUN: 45 mg/dL — AB (ref 6–20)
CO2: 16 mmol/L — ABNORMAL LOW (ref 22–32)
Calcium: 6.7 mg/dL — ABNORMAL LOW (ref 8.9–10.3)
Chloride: 90 mmol/L — ABNORMAL LOW (ref 101–111)
Creatinine, Ser: 2.04 mg/dL — ABNORMAL HIGH (ref 0.44–1.00)
GFR, EST AFRICAN AMERICAN: 33 mL/min — AB (ref >60)
GFR, EST NON AFRICAN AMERICAN: 28 mL/min — AB (ref >60)
Glucose, Bld: 724 mg/dL (ref 65–99)
POTASSIUM: 4.7 mmol/L (ref 3.5–5.1)
Sodium: 125 mmol/L — ABNORMAL LOW (ref 135–145)
TOTAL PROTEIN: 7.2 g/dL (ref 6.5–8.1)

## 2017-04-25 LAB — BASIC METABOLIC PANEL
Anion gap: 16 — ABNORMAL HIGH (ref 5–15)
Anion gap: 14 (ref 5–15)
Anion gap: 11 (ref 5–15)
Anion gap: 11 (ref 5–15)
BUN: 44 mg/dL — ABNORMAL HIGH (ref 6–20)
BUN: 41 mg/dL — AB (ref 6–20)
BUN: 37 mg/dL — AB (ref 6–20)
BUN: 34 mg/dL — AB (ref 6–20)
CALCIUM: 7.3 mg/dL — AB (ref 8.9–10.3)
CALCIUM: 7.6 mg/dL — AB (ref 8.9–10.3)
CO2: 17 mmol/L — ABNORMAL LOW (ref 22–32)
CO2: 18 mmol/L — ABNORMAL LOW (ref 22–32)
CO2: 22 mmol/L (ref 22–32)
CO2: 23 mmol/L (ref 22–32)
CREATININE: 1.85 mg/dL — AB (ref 0.44–1.00)
CREATININE: 1.61 mg/dL — AB (ref 0.44–1.00)
CREATININE: 1.31 mg/dL — AB (ref 0.44–1.00)
CREATININE: 1.15 mg/dL — AB (ref 0.44–1.00)
Calcium: 6.8 mg/dL — ABNORMAL LOW (ref 8.9–10.3)
Calcium: 6.8 mg/dL — ABNORMAL LOW (ref 8.9–10.3)
Chloride: 93 mmol/L — ABNORMAL LOW (ref 101–111)
Chloride: 97 mmol/L — ABNORMAL LOW (ref 101–111)
Chloride: 99 mmol/L — ABNORMAL LOW (ref 101–111)
Chloride: 99 mmol/L — ABNORMAL LOW (ref 101–111)
GFR calc Af Amer: 56 mL/min — ABNORMAL LOW (ref >60)
GFR calc non Af Amer: 48 mL/min — ABNORMAL LOW (ref >60)
GFR, EST AFRICAN AMERICAN: 37 mL/min — AB (ref >60)
GFR, EST AFRICAN AMERICAN: 44 mL/min — AB (ref >60)
GFR, EST NON AFRICAN AMERICAN: 32 mL/min — AB (ref >60)
GFR, EST NON AFRICAN AMERICAN: 38 mL/min — AB (ref >60)
GFR, EST NON AFRICAN AMERICAN: 57 mL/min — AB (ref >60)
Glucose, Bld: 703 mg/dL (ref 65–99)
Glucose, Bld: 557 mg/dL (ref 65–99)
Glucose, Bld: 355 mg/dL — ABNORMAL HIGH (ref 65–99)
Glucose, Bld: 153 mg/dL — ABNORMAL HIGH (ref 65–99)
POTASSIUM: 3.7 mmol/L (ref 3.5–5.1)
Potassium: 5 mmol/L (ref 3.5–5.1)
Potassium: 3.3 mmol/L — ABNORMAL LOW (ref 3.5–5.1)
Potassium: 3.4 mmol/L — ABNORMAL LOW (ref 3.5–5.1)
SODIUM: 126 mmol/L — AB (ref 135–145)
SODIUM: 129 mmol/L — AB (ref 135–145)
SODIUM: 132 mmol/L — AB (ref 135–145)
SODIUM: 133 mmol/L — AB (ref 135–145)

## 2017-04-25 LAB — BLOOD CULTURE ID PANEL (REFLEXED)
Acinetobacter baumannii: NOT DETECTED
CANDIDA PARAPSILOSIS: NOT DETECTED
CARBAPENEM RESISTANCE: NOT DETECTED
Candida albicans: NOT DETECTED
Candida glabrata: NOT DETECTED
Candida krusei: NOT DETECTED
Candida tropicalis: NOT DETECTED
ENTEROBACTERIACEAE SPECIES: DETECTED — AB
ENTEROCOCCUS SPECIES: NOT DETECTED
Enterobacter cloacae complex: NOT DETECTED
Escherichia coli: DETECTED — AB
HAEMOPHILUS INFLUENZAE: NOT DETECTED
KLEBSIELLA PNEUMONIAE: NOT DETECTED
Klebsiella oxytoca: NOT DETECTED
Listeria monocytogenes: NOT DETECTED
NEISSERIA MENINGITIDIS: NOT DETECTED
PSEUDOMONAS AERUGINOSA: NOT DETECTED
Proteus species: NOT DETECTED
STAPHYLOCOCCUS AUREUS BCID: NOT DETECTED
STREPTOCOCCUS PYOGENES: NOT DETECTED
STREPTOCOCCUS SPECIES: NOT DETECTED
Serratia marcescens: NOT DETECTED
Staphylococcus species: NOT DETECTED
Streptococcus agalactiae: NOT DETECTED
Streptococcus pneumoniae: NOT DETECTED

## 2017-04-25 LAB — GLUCOSE, CAPILLARY
GLUCOSE-CAPILLARY: 545 mg/dL — AB (ref 65–99)
GLUCOSE-CAPILLARY: 399 mg/dL — AB (ref 65–99)
GLUCOSE-CAPILLARY: 178 mg/dL — AB (ref 65–99)
GLUCOSE-CAPILLARY: 169 mg/dL — AB (ref 65–99)
Glucose-Capillary: >600 mg/dL (ref 65–99)
Glucose-Capillary: >600 mg/dL (ref 65–99)
Glucose-Capillary: >600 mg/dL (ref 65–99)
Glucose-Capillary: 443 mg/dL — ABNORMAL HIGH (ref 65–99)
Glucose-Capillary: 420 mg/dL — ABNORMAL HIGH (ref 65–99)
Glucose-Capillary: 353 mg/dL — ABNORMAL HIGH (ref 65–99)
Glucose-Capillary: 312 mg/dL — ABNORMAL HIGH (ref 65–99)
Glucose-Capillary: 220 mg/dL — ABNORMAL HIGH (ref 65–99)
Glucose-Capillary: 130 mg/dL — ABNORMAL HIGH (ref 65–99)
Glucose-Capillary: 132 mg/dL — ABNORMAL HIGH (ref 65–99)

## 2017-04-25 LAB — TSH: TSH: 0.684 u[IU]/mL (ref 0.350–4.500)

## 2017-04-25 LAB — MRSA PCR SCREENING: MRSA BY PCR: NEGATIVE

## 2017-04-25 LAB — TROPONIN I

## 2017-04-25 LAB — PHOSPHORUS: Phosphorus: 3.5 mg/dL (ref 2.5–4.6)

## 2017-04-25 LAB — SODIUM, URINE, RANDOM: SODIUM UR: 26 mmol/L

## 2017-04-25 LAB — INFLUENZA PANEL BY PCR (TYPE A & B)
INFLBPCR: NEGATIVE
Influenza A By PCR: NEGATIVE

## 2017-04-25 LAB — MAGNESIUM: MAGNESIUM: 1.5 mg/dL — AB (ref 1.7–2.4)

## 2017-04-25 LAB — CBG MONITORING, ED: Glucose-Capillary: >600 mg/dL (ref 65–99)

## 2017-04-25 MED ORDER — SODIUM CHLORIDE 0.9 % IV SOLN
1.0000 g | Freq: Once | INTRAVENOUS | Status: AC
  Administered 2017-04-25: 1 g via INTRAVENOUS
  Filled 2017-04-25: qty 10

## 2017-04-25 MED ORDER — SODIUM CHLORIDE 0.9% FLUSH
3.0000 mL | Freq: Two times a day (BID) | INTRAVENOUS | Status: DC
  Administered 2017-04-25 – 2017-04-27 (×6): 3 mL via INTRAVENOUS

## 2017-04-25 MED ORDER — POTASSIUM CHLORIDE 10 MEQ/100ML IV SOLN
10.0000 meq | INTRAVENOUS | Status: AC
  Administered 2017-04-25: 10 meq via INTRAVENOUS
  Filled 2017-04-25 (×2): qty 100

## 2017-04-25 MED ORDER — ALBUTEROL SULFATE (2.5 MG/3ML) 0.083% IN NEBU
2.5000 mg | INHALATION_SOLUTION | Freq: Four times a day (QID) | RESPIRATORY_TRACT | Status: DC
  Administered 2017-04-26 – 2017-04-27 (×6): 2.5 mg via RESPIRATORY_TRACT
  Filled 2017-04-25 (×7): qty 3

## 2017-04-25 MED ORDER — ATORVASTATIN CALCIUM 10 MG PO TABS
20.0000 mg | ORAL_TABLET | Freq: Every day | ORAL | Status: DC
  Administered 2017-04-25 – 2017-04-26 (×2): 20 mg via ORAL
  Filled 2017-04-25: qty 1
  Filled 2017-04-25: qty 2
  Filled 2017-04-25: qty 1
  Filled 2017-04-25: qty 2

## 2017-04-25 MED ORDER — METHYLPREDNISOLONE SODIUM SUCC 125 MG IJ SOLR
80.0000 mg | Freq: Three times a day (TID) | INTRAMUSCULAR | Status: DC
  Administered 2017-04-25: 80 mg via INTRAVENOUS
  Filled 2017-04-25: qty 2

## 2017-04-25 MED ORDER — PREGABALIN 50 MG PO CAPS
50.0000 mg | ORAL_CAPSULE | Freq: Three times a day (TID) | ORAL | Status: DC
  Administered 2017-04-25 – 2017-04-27 (×8): 50 mg via ORAL
  Filled 2017-04-25 (×8): qty 1

## 2017-04-25 MED ORDER — INSULIN ASPART 100 UNIT/ML ~~LOC~~ SOLN: 0.0000 [IU] | Freq: Three times a day (TID) | SUBCUTANEOUS | Status: DC

## 2017-04-25 MED ORDER — INSULIN ASPART 100 UNIT/ML ~~LOC~~ SOLN: 10.0000 [IU] | Freq: Three times a day (TID) | SUBCUTANEOUS | Status: DC

## 2017-04-25 MED ORDER — SODIUM CHLORIDE 0.9 % IV SOLN
INTRAVENOUS | Status: AC
  Administered 2017-04-25: 09:00:00 via INTRAVENOUS

## 2017-04-25 MED ORDER — SODIUM CHLORIDE 0.9 % IV SOLN
INTRAVENOUS | Status: DC
  Administered 2017-04-25 (×2): via INTRAVENOUS

## 2017-04-25 MED ORDER — INSULIN ASPART 100 UNIT/ML ~~LOC~~ SOLN
0.0000 [IU] | Freq: Every day | SUBCUTANEOUS | Status: DC
  Administered 2017-04-25: 10 [IU] via SUBCUTANEOUS

## 2017-04-25 MED ORDER — ALBUTEROL SULFATE (2.5 MG/3ML) 0.083% IN NEBU
2.5000 mg | INHALATION_SOLUTION | Freq: Four times a day (QID) | RESPIRATORY_TRACT | Status: DC
  Administered 2017-04-25 (×3): 2.5 mg via RESPIRATORY_TRACT
  Filled 2017-04-25 (×4): qty 3

## 2017-04-25 MED ORDER — HEPARIN SODIUM (PORCINE) 5000 UNIT/ML IJ SOLN
5000.0000 [IU] | Freq: Three times a day (TID) | INTRAMUSCULAR | Status: DC
  Administered 2017-04-25 – 2017-04-27 (×9): 5000 [IU] via SUBCUTANEOUS
  Filled 2017-04-25 (×8): qty 1

## 2017-04-25 MED ORDER — MAGNESIUM SULFATE 2 GM/50ML IV SOLN
2.0000 g | Freq: Once | INTRAVENOUS | Status: AC
  Administered 2017-04-25: 2 g via INTRAVENOUS
  Filled 2017-04-25: qty 50

## 2017-04-25 MED ORDER — INSULIN GLARGINE 100 UNIT/ML ~~LOC~~ SOLN
60.0000 [IU] | Freq: Every day | SUBCUTANEOUS | Status: DC
  Administered 2017-04-25: 60 [IU] via SUBCUTANEOUS
  Filled 2017-04-25: qty 0.6

## 2017-04-25 MED ORDER — FUROSEMIDE 40 MG PO TABS
40.0000 mg | ORAL_TABLET | Freq: Two times a day (BID) | ORAL | Status: DC
  Administered 2017-04-25 – 2017-04-27 (×4): 40 mg via ORAL
  Filled 2017-04-25 (×4): qty 1

## 2017-04-25 MED ORDER — GUAIFENESIN-CODEINE 100-10 MG/5ML PO SOLN
10.0000 mL | Freq: Two times a day (BID) | ORAL | Status: DC
  Administered 2017-04-25 – 2017-04-27 (×6): 10 mL via ORAL
  Filled 2017-04-25 (×6): qty 10

## 2017-04-25 MED ORDER — TRAMADOL HCL 50 MG PO TABS
50.0000 mg | ORAL_TABLET | Freq: Four times a day (QID) | ORAL | Status: DC | PRN
  Administered 2017-04-26: 50 mg via ORAL
  Filled 2017-04-25: qty 1

## 2017-04-25 MED ORDER — POTASSIUM CHLORIDE CRYS ER 20 MEQ PO TBCR
20.0000 meq | EXTENDED_RELEASE_TABLET | Freq: Every day | ORAL | Status: DC
  Administered 2017-04-25 – 2017-04-27 (×3): 20 meq via ORAL
  Filled 2017-04-25 (×3): qty 1

## 2017-04-25 MED ORDER — POTASSIUM CHLORIDE 10 MEQ/100ML IV SOLN
10.0000 meq | INTRAVENOUS | Status: AC
  Administered 2017-04-25: 10 meq via INTRAVENOUS
  Filled 2017-04-25: qty 100

## 2017-04-25 MED ORDER — UMECLIDINIUM BROMIDE 62.5 MCG/INH IN AEPB
1.0000 | INHALATION_SPRAY | Freq: Every day | RESPIRATORY_TRACT | Status: DC
  Administered 2017-04-25 – 2017-04-27 (×3): 1 via RESPIRATORY_TRACT
  Filled 2017-04-25: qty 7

## 2017-04-25 MED ORDER — FLUTICASONE FUROATE-VILANTEROL 100-25 MCG/INH IN AEPB
1.0000 | INHALATION_SPRAY | Freq: Two times a day (BID) | RESPIRATORY_TRACT | Status: DC
  Administered 2017-04-25 – 2017-04-27 (×5): 1 via RESPIRATORY_TRACT
  Filled 2017-04-25: qty 28

## 2017-04-25 MED ORDER — CLONIDINE HCL 0.1 MG PO TABS
0.1000 mg | ORAL_TABLET | Freq: Two times a day (BID) | ORAL | Status: DC
  Administered 2017-04-25 – 2017-04-27 (×5): 0.1 mg via ORAL
  Filled 2017-04-25 (×5): qty 1

## 2017-04-25 MED ORDER — ALBUTEROL SULFATE (2.5 MG/3ML) 0.083% IN NEBU: 3.0000 mL | INHALATION_SOLUTION | RESPIRATORY_TRACT | Status: DC | PRN

## 2017-04-25 MED ORDER — AMITRIPTYLINE HCL 25 MG PO TABS
100.0000 mg | ORAL_TABLET | Freq: Every day | ORAL | Status: DC
  Administered 2017-04-25 – 2017-04-26 (×3): 100 mg via ORAL
  Filled 2017-04-25 (×3): qty 4

## 2017-04-25 MED ORDER — SODIUM CHLORIDE 0.9 % IV SOLN
INTRAVENOUS | Status: DC
  Administered 2017-04-25 (×2): via INTRAVENOUS

## 2017-04-25 MED ORDER — ACETAMINOPHEN 325 MG PO TABS: 650.0000 mg | ORAL_TABLET | Freq: Four times a day (QID) | ORAL | Status: DC | PRN

## 2017-04-25 MED ORDER — METHYLPREDNISOLONE SODIUM SUCC 125 MG IJ SOLR
60.0000 mg | Freq: Two times a day (BID) | INTRAMUSCULAR | Status: DC
  Administered 2017-04-25 – 2017-04-26 (×2): 60 mg via INTRAVENOUS
  Filled 2017-04-25 (×2): qty 2

## 2017-04-25 MED ORDER — PANTOPRAZOLE SODIUM 40 MG PO TBEC
40.0000 mg | DELAYED_RELEASE_TABLET | Freq: Every day | ORAL | Status: DC
  Administered 2017-04-25 – 2017-04-27 (×2): 40 mg via ORAL
  Filled 2017-04-25 (×2): qty 1

## 2017-04-25 MED ORDER — ACETAMINOPHEN 650 MG RE SUPP: 650.0000 mg | Freq: Four times a day (QID) | RECTAL | Status: DC | PRN

## 2017-04-25 MED ORDER — ADULT MULTIVITAMIN W/MINERALS CH
1.0000 | ORAL_TABLET | Freq: Every day | ORAL | Status: DC
  Administered 2017-04-25 – 2017-04-27 (×3): 1 via ORAL
  Filled 2017-04-25 (×3): qty 1

## 2017-04-25 MED ORDER — DEXTROSE-NACL 5-0.45 % IV SOLN
INTRAVENOUS | Status: DC
  Administered 2017-04-25: 18:00:00 via INTRAVENOUS

## 2017-04-25 MED ORDER — SODIUM CHLORIDE 0.9 % IV SOLN
INTRAVENOUS | Status: DC
  Administered 2017-04-25: 5.4 [IU]/h via INTRAVENOUS
  Administered 2017-04-26: 04:00:00 via INTRAVENOUS
  Filled 2017-04-25 (×2): qty 2.5

## 2017-04-25 NOTE — Progress Notes (Signed)
Notified MD of cbg>600. Verbal orders given to give 10 units novolog and the 60 of lantus. Will continue to monitor patient.

## 2017-04-25 NOTE — Progress Notes (Addendum)
[facility] PROGRESS NOTE  Carla Hicks ZOX:[id number] DOB: 1971-06-24 DOA: 04/24/2017 PCP: Dessa Phi, MD  Interim summary and HPI 46 year old female with a past medical history significant for hypertension, COPD on home O2 at baseline, uncontrolled type 2 diabetes (chronically on insulin), chronic renal failure, neuropathy, GERD and obesity; who presented to the ED complaining of increased wheezing, shortness of breath, productive cough and general malaise.  Patient found with concern for left lower lobe pneumonia, meeting sepsis criteria on admission, COPD exacerbation and also type II DKA.  Assessment/Plan: 1-sepsis with acute on chronic resp failure with hypoxia: due to PNA and asthma/COPD exacerbation. -will continue IV antibiotics (aiming cover HCAP) -will continue nebulizer treatment and steroids -will continue O2 supplementation and will follow clinical response  -will follow blood cx's and urine culture   2-DKA -patient with A. Gap of 19, Bicarb of 16 and CBG's in the 700 range -patient started on DKA protocol and made NPO for now -will follow A1C and adjust hypoglycemic regimen as needed   3-HTN -stable -will continue current antihypertensive regimen   4-hyponatremia -most likely due to hyperglycemia -will monitor trend -continue IVF's and glucose control  5-acute renal failure on chronic kidney disease (stage 2 at baseline) -will continue IVF's -adjust medications for renal function -follow trend   6-obesity -Body mass index is 36.05 kg/m. -low calorie diet and exercise discussed with patient   7-HLD -continue statins  8-GERD -continue PPI  9-Neuropathy -continue neurontin and lyrica   Code Status: DNR Family Communication: daughter by phone and granddaughter at bedside  Disposition Plan: will keep in stepdown, continue IVF's, IV antibiotics, follow cx's, continue treatment for COPD exacerbation and also treatment for  DKA.   Consultants:  None   Procedures:  See below for x-ray reports   Antibiotics:  Cefepime and vancomycin 5/5  HPI/Subjective: Afebrile currently; reports breathing is improved. Patient found to be in acute DKA and also with ongoing difficulty speaking in full sentences and tachypneic.  Objective: Vitals:   04/25/17 0855 04/25/17 1300  BP: (!) 158/108 126/90  Pulse: 83   Resp: (!) 23   Temp:      Intake/Output Summary (Last 24 hours) at 04/25/17 1511 Last data filed at 04/25/17 1300  Gross per 24 hour  Intake          3535.44 ml  Output             1050 ml  Net          2485.44 ml   Filed Weights   04/25/17 0346  Weight: 95.3 kg (210 lb)    Exam:   General:  Afebrile currently, but with high grade temp overnight. Reports breathing is slightly better. Patient with difficulty speaking in full sentences and tachypneic.  Cardiovascular: tachycardic, S1 and S2, no rubs, no gallops  Respiratory: positive wheezing and rhonchi diffusely; no crackles    Abdomen: soft, NT, ND, positive BS  Musculoskeletal: no edema, no cyanosis, patient reports pain in her left hip  Data Reviewed: Basic Metabolic Panel:  Recent Labs Lab 04/24/17 1954 04/25/17 0618 04/25/17 0843 04/25/17 1125  NA 121* 125* 126* 129*  K 4.3 4.7 5.0 3.7  CL 80* 90* 93* 97*  CO2 24 16* 17* 18*  GLUCOSE 685* 724* 703* 557*  BUN 48* 45* 44* 41*  CREATININE 2.52* 2.04* 1.85* 1.61*  CALCIUM 6.8* 6.7* 6.8* 6.8*  MG  --   --  1.5*  --   PHOS  --   --  3.5  --    Liver Function Tests:  Recent Labs Lab 04/24/17 1954 04/25/17 0618  AST 73* 75*  ALT 31 31  ALKPHOS 64 62  BILITOT 2.0* 1.9*  PROT 7.4 7.2  ALBUMIN 2.7* 2.5*    Recent Labs Lab 04/24/17 2310  LIPASE 19   CBC:  Recent Labs Lab 04/24/17 1954 04/25/17 0618  WBC 5.2 7.2  NEUTROABS 4.1  --   HGB 16.0* 10.3*  HCT 46.0 30.5*  MCV 88.8 90.0  PLT 87* 131*   Cardiac Enzymes:  Recent Labs Lab 04/24/17 2310  04/25/17 0618 04/25/17 1123  TROPONINI <0.03 <0.03 <0.03   BNP (last 3 results)  Recent Labs  11/05/16 2311 02/23/17 1024 04/24/17 1954  BNP 73.9 71.0 139.8*   CBG:  Recent Labs Lab 04/25/17 0950 04/25/17 1103 04/25/17 1208 04/25/17 1328 04/25/17 1423  GLUCAP >600* 545* 443* 420* 399*    Recent Results (from the past 240 hour(s))  MRSA PCR Screening     Status: None   Collection Time: 04/25/17 12:56 AM  Result Value Ref Range Status   MRSA by PCR NEGATIVE NEGATIVE Final    Comment:        The GeneXpert MRSA Assay (FDA approved for NASAL specimens only), is one component of a comprehensive MRSA colonization surveillance program. It is not intended to diagnose MRSA infection nor to guide or monitor treatment for MRSA infections.      Studies: Ct Abdomen Pelvis Wo Contrast  Result Date: 04/24/2017 CLINICAL DATA:  Dyspnea and a diffuse abdominal pain EXAM: CT ABDOMEN AND PELVIS WITHOUT CONTRAST TECHNIQUE: Multidetector CT imaging of the abdomen and pelvis was performed following the standard protocol without IV contrast. COMPARISON:  04/16/2016 FINDINGS: Lower chest: Linear lung base scarring or atelectasis without confluent consolidation. No pleural effusions. Hepatobiliary: No focal liver abnormality is seen. No gallstones, gallbladder wall thickening, or biliary dilatation. Pancreas: There is a 5 x 5 x 7 cm collection at the pancreatic distal body, likely representing a pseudocyst. No acute inflammatory changes of the pancreas. No duct dilatation. Spleen: Normal in size without focal abnormality. Adrenals/Urinary Tract: Both adrenals are normal. There is inflammation of the right kidney with perinephric and periureteral stranding. No calculi. No mass is evident. No perinephric collection. Left kidney and ureter appear normal. Urinary bladder is distended but otherwise unremarkable. Stomach/Bowel: Stomach is within normal limits. Appendix is normal. No evidence of bowel  wall thickening, distention, or inflammatory changes. Vascular/Lymphatic: No significant vascular findings are present. No enlarged abdominal or pelvic lymph nodes. Reproductive: Uterus and bilateral adnexa are unremarkable. Other: No ascites.  Fat containing low midline ventral hernia. Musculoskeletal: No significant skeletal lesion. IMPRESSION: 1. Right renal and ureteral stranding suggesting edema. This might represent pyelonephritis. No obstructing stone or mass is evident. No perinephric collection. 2. 5 x 5 x 7 cm pancreatic collection in the region of the pancreatic body to the left of midline, most likely a pseudocyst. No acute inflammatory changes of the pancreas. 3. Fat containing low midline ventral hernia. 4. Bowel is unremarkable, with no evidence of obstruction or perforation. No acute inflammation of bowel. Electronically Signed   By: Ellery Plunk M.D.   On: 04/24/2017 23:57   Dg Chest Port 1 View  Result Date: 04/24/2017 CLINICAL DATA:  Acute shortness of breath. EXAM: PORTABLE CHEST 1 VIEW COMPARISON:  02/23/2017 FINDINGS: Cardiomegaly noted. Mild bibasilar atelectasis noted. There is no evidence of focal airspace disease, pulmonary edema, suspicious pulmonary nodule/mass, pleural effusion, or pneumothorax.  No acute bony abnormalities are identified. IMPRESSION: Cardiomegaly with mild bibasilar atelectasis. Electronically Signed   By: Harmon Pier M.D.   On: 04/24/2017 21:24   Dg Hip Unilat With Pelvis 2-3 Views Left  Result Date: 04/24/2017 CLINICAL DATA:  Nontraumatic left hip pain. EXAM: DG HIP (WITH OR WITHOUT PELVIS) 2-3V LEFT COMPARISON:  None. FINDINGS: There is no evidence of hip fracture or dislocation. There is no evidence of arthropathy or other focal bone abnormality. IMPRESSION: Negative. Electronically Signed   By: Ellery Plunk M.D.   On: 04/24/2017 23:46    Scheduled Meds: . albuterol  2.5 mg Nebulization Q6H  . amitriptyline  100 mg Oral QHS  . atorvastatin  20  mg Oral q1800  . cloNIDine  0.1 mg Oral BID  . fluticasone furoate-vilanterol  1 puff Inhalation BID  . furosemide  40 mg Oral BID  . guaiFENesin-codeine  10 mL Oral BID  . heparin  5,000 Units Subcutaneous Q8H  . methylPREDNISolone (SOLU-MEDROL) injection  80 mg Intravenous Q8H  . multivitamin with minerals  1 tablet Oral Daily  . potassium chloride SA  20 mEq Oral Daily  . pregabalin  50 mg Oral TID  . sodium chloride flush  3 mL Intravenous Q12H  . umeclidinium bromide  1 puff Inhalation Daily   Continuous Infusions: . sodium chloride 100 mL/hr at 04/25/17 0947  . ceFEPime (MAXIPIME) IV    . dextrose 5 % and 0.45% NaCl    . insulin (NOVOLIN-R) infusion 17 Units/hr (04/25/17 1425)  . vancomycin    . vancomycin      Time spent: 25 minutes    Vassie Loll  [facility] Pager [phone]. If 7PM-7AM, please contact night-coverage at [email or web address], password [facility] 04/25/2017, 3:11 PM  LOS: 1 day

## 2017-04-25 NOTE — Progress Notes (Signed)
PHARMACY - PHYSICIAN COMMUNICATION CRITICAL VALUE ALERT - BLOOD CULTURE IDENTIFICATION (BCID)  Results for orders placed or performed during the hospital encounter of 04/24/17  Blood Culture ID Panel (Reflexed) (Collected: 04/24/2017  8:08 PM)  Result Value Ref Range   Enterococcus species NOT DETECTED NOT DETECTED   Listeria monocytogenes NOT DETECTED NOT DETECTED   Staphylococcus species NOT DETECTED NOT DETECTED   Staphylococcus aureus NOT DETECTED NOT DETECTED   Streptococcus species NOT DETECTED NOT DETECTED   Streptococcus agalactiae NOT DETECTED NOT DETECTED   Streptococcus pneumoniae NOT DETECTED NOT DETECTED   Streptococcus pyogenes NOT DETECTED NOT DETECTED   Acinetobacter baumannii NOT DETECTED NOT DETECTED   Enterobacteriaceae species DETECTED (A) NOT DETECTED   Enterobacter cloacae complex NOT DETECTED NOT DETECTED   Escherichia coli DETECTED (A) NOT DETECTED   Klebsiella oxytoca NOT DETECTED NOT DETECTED   Klebsiella pneumoniae NOT DETECTED NOT DETECTED   Proteus species NOT DETECTED NOT DETECTED   Serratia marcescens NOT DETECTED NOT DETECTED   Carbapenem resistance NOT DETECTED NOT DETECTED   Haemophilus influenzae NOT DETECTED NOT DETECTED   Neisseria meningitidis NOT DETECTED NOT DETECTED   Pseudomonas aeruginosa NOT DETECTED NOT DETECTED   Candida albicans NOT DETECTED NOT DETECTED   Candida glabrata NOT DETECTED NOT DETECTED   Candida krusei NOT DETECTED NOT DETECTED   Candida parapsilosis NOT DETECTED NOT DETECTED   Candida tropicalis NOT DETECTED NOT DETECTED    Name of physician (or Provider) Contacted: None.   Changes to prescribed antibiotics required: None. Continue broad-coverage for now. Pharmacy is following.  Charolotte Eke, PharmD, pager [phone]. 04/25/2017,4:53 PM.

## 2017-04-25 NOTE — Progress Notes (Signed)
Spoke with Dr Gwenlyn Perking about k 3.3.  He said to order to runs of IV k.   Erick Blinks, RN

## 2017-04-26 DIAGNOSIS — A4151 Sepsis due to Escherichia coli [E. coli]: Principal | ICD-10-CM

## 2017-04-26 DIAGNOSIS — Z794 Long term (current) use of insulin: Secondary | ICD-10-CM

## 2017-04-26 DIAGNOSIS — B9620 Unspecified Escherichia coli [E. coli] as the cause of diseases classified elsewhere: Secondary | ICD-10-CM

## 2017-04-26 DIAGNOSIS — E1165 Type 2 diabetes mellitus with hyperglycemia: Secondary | ICD-10-CM

## 2017-04-26 DIAGNOSIS — E119 Type 2 diabetes mellitus without complications: Secondary | ICD-10-CM

## 2017-04-26 DIAGNOSIS — I10 Essential (primary) hypertension: Secondary | ICD-10-CM

## 2017-04-26 DIAGNOSIS — R7881 Bacteremia: Secondary | ICD-10-CM

## 2017-04-26 DIAGNOSIS — N179 Acute kidney failure, unspecified: Secondary | ICD-10-CM

## 2017-04-26 DIAGNOSIS — K219 Gastro-esophageal reflux disease without esophagitis: Secondary | ICD-10-CM

## 2017-04-26 DIAGNOSIS — E669 Obesity, unspecified: Secondary | ICD-10-CM

## 2017-04-26 DIAGNOSIS — E1169 Type 2 diabetes mellitus with other specified complication: Secondary | ICD-10-CM

## 2017-04-26 DIAGNOSIS — E1159 Type 2 diabetes mellitus with other circulatory complications: Secondary | ICD-10-CM

## 2017-04-26 DIAGNOSIS — N390 Urinary tract infection, site not specified: Secondary | ICD-10-CM

## 2017-04-26 DIAGNOSIS — N182 Chronic kidney disease, stage 2 (mild): Secondary | ICD-10-CM

## 2017-04-26 DIAGNOSIS — E118 Type 2 diabetes mellitus with unspecified complications: Secondary | ICD-10-CM

## 2017-04-26 LAB — BASIC METABOLIC PANEL
Anion gap: 10 (ref 5–15)
BUN: 35 mg/dL — AB (ref 6–20)
CALCIUM: 7.5 mg/dL — AB (ref 8.9–10.3)
CO2: 23 mmol/L (ref 22–32)
CREATININE: 1.01 mg/dL — AB (ref 0.44–1.00)
Chloride: 99 mmol/L — ABNORMAL LOW (ref 101–111)
GFR calc Af Amer: >60 mL/min (ref >60)
Glucose, Bld: 136 mg/dL — ABNORMAL HIGH (ref 65–99)
Potassium: 3.3 mmol/L — ABNORMAL LOW (ref 3.5–5.1)
SODIUM: 132 mmol/L — AB (ref 135–145)

## 2017-04-26 LAB — GLUCOSE, CAPILLARY
GLUCOSE-CAPILLARY: 173 mg/dL — AB (ref 65–99)
GLUCOSE-CAPILLARY: 160 mg/dL — AB (ref 65–99)
GLUCOSE-CAPILLARY: 172 mg/dL — AB (ref 65–99)
GLUCOSE-CAPILLARY: 159 mg/dL — AB (ref 65–99)
GLUCOSE-CAPILLARY: 149 mg/dL — AB (ref 65–99)
Glucose-Capillary: 132 mg/dL — ABNORMAL HIGH (ref 65–99)
Glucose-Capillary: 159 mg/dL — ABNORMAL HIGH (ref 65–99)
Glucose-Capillary: 175 mg/dL — ABNORMAL HIGH (ref 65–99)
Glucose-Capillary: 181 mg/dL — ABNORMAL HIGH (ref 65–99)
Glucose-Capillary: 176 mg/dL — ABNORMAL HIGH (ref 65–99)
Glucose-Capillary: 144 mg/dL — ABNORMAL HIGH (ref 65–99)
Glucose-Capillary: 388 mg/dL — ABNORMAL HIGH (ref 65–99)
Glucose-Capillary: 331 mg/dL — ABNORMAL HIGH (ref 65–99)

## 2017-04-26 LAB — HEMOGLOBIN A1C
HEMOGLOBIN A1C: 9.2 % — AB (ref 4.8–5.6)
HEMOGLOBIN A1C: 9.2 % — AB (ref 4.8–5.6)
MEAN PLASMA GLUCOSE: 217 mg/dL
Mean Plasma Glucose: 217 mg/dL

## 2017-04-26 LAB — CREATININE, SERUM
CREATININE: 1.05 mg/dL — AB (ref 0.44–1.00)
GFR calc Af Amer: >60 mL/min (ref >60)
GFR calc non Af Amer: >60 mL/min (ref >60)

## 2017-04-26 LAB — URINE CULTURE

## 2017-04-26 LAB — CALCIUM / CREATININE RATIO, URINE
CALCIUM UR: 3.6 mg/dL
Calcium/Creat.Ratio: 55 mg/g creat (ref 0–260)
Creatinine, Urine: 65.1 mg/dL

## 2017-04-26 MED ORDER — METHYLPREDNISOLONE SODIUM SUCC 40 MG IJ SOLR
40.0000 mg | Freq: Two times a day (BID) | INTRAMUSCULAR | Status: DC
  Administered 2017-04-26 – 2017-04-27 (×2): 40 mg via INTRAVENOUS
  Filled 2017-04-26 (×2): qty 1

## 2017-04-26 MED ORDER — POTASSIUM CHLORIDE CRYS ER 20 MEQ PO TBCR: 40.0000 meq | EXTENDED_RELEASE_TABLET | Freq: Once | ORAL | Status: DC

## 2017-04-26 MED ORDER — INSULIN GLARGINE 100 UNIT/ML ~~LOC~~ SOLN
25.0000 [IU] | Freq: Two times a day (BID) | SUBCUTANEOUS | Status: DC
  Administered 2017-04-26 (×2): 25 [IU] via SUBCUTANEOUS
  Filled 2017-04-26 (×3): qty 0.25

## 2017-04-26 MED ORDER — INSULIN ASPART 100 UNIT/ML ~~LOC~~ SOLN
0.0000 [IU] | Freq: Three times a day (TID) | SUBCUTANEOUS | Status: DC
  Administered 2017-04-26: 4 [IU] via SUBCUTANEOUS
  Administered 2017-04-26: 20 [IU] via SUBCUTANEOUS
  Administered 2017-04-26: 3 [IU] via SUBCUTANEOUS
  Administered 2017-04-27: 15 [IU] via SUBCUTANEOUS

## 2017-04-26 MED ORDER — IBUPROFEN 200 MG PO TABS
400.0000 mg | ORAL_TABLET | Freq: Three times a day (TID) | ORAL | Status: DC | PRN
  Filled 2017-04-26: qty 2

## 2017-04-26 MED ORDER — HYDRALAZINE HCL 20 MG/ML IJ SOLN
10.0000 mg | Freq: Once | INTRAMUSCULAR | Status: AC
  Administered 2017-04-26: 10 mg via INTRAVENOUS
  Filled 2017-04-26: qty 1

## 2017-04-26 MED ORDER — CLONIDINE HCL 0.1 MG PO TABS
0.1000 mg | ORAL_TABLET | Freq: Once | ORAL | Status: AC
  Administered 2017-04-26: 0.1 mg via ORAL
  Filled 2017-04-26: qty 1

## 2017-04-26 MED ORDER — TRAMADOL HCL 50 MG PO TABS
50.0000 mg | ORAL_TABLET | Freq: Four times a day (QID) | ORAL | Status: DC | PRN
  Administered 2017-04-26: 50 mg via ORAL
  Filled 2017-04-26 (×2): qty 1

## 2017-04-26 MED ORDER — LEVOFLOXACIN IN D5W 750 MG/150ML IV SOLN
750.0000 mg | INTRAVENOUS | Status: DC
  Administered 2017-04-26 – 2017-04-27 (×2): 750 mg via INTRAVENOUS
  Filled 2017-04-26 (×2): qty 150

## 2017-04-26 MED ORDER — POTASSIUM CHLORIDE IN NACL 20-0.9 MEQ/L-% IV SOLN
INTRAVENOUS | Status: DC
  Administered 2017-04-26: 04:00:00 via INTRAVENOUS
  Filled 2017-04-26 (×2): qty 1000

## 2017-04-26 MED ORDER — INSULIN ASPART 100 UNIT/ML ~~LOC~~ SOLN
0.0000 [IU] | Freq: Every day | SUBCUTANEOUS | Status: DC
  Administered 2017-04-26: 4 [IU] via SUBCUTANEOUS

## 2017-04-26 NOTE — Care Management Note (Signed)
Case Management Note  Patient Details  Name: Carla Hicks MRN: [id number] Date of Birth: 1971/10/03  Subjective/Objective:                  sepsis with acute on chronic resp failure with hypoxia: due to PNA, e-coli bacteremia/UTI and asthma/COPD exacerbation -DKA E. Coli UTI and Bacteremia Action/Plan:  Date:  Apr 26, 2017  Chart reviewed for concurrent status and case management needs.  Will continue to follow patient progress.  Discharge Planning: following for needs  Expected discharge date: [id number]  Marcelle Smiling, BSN, [place], [place]   [phone]  Expected Discharge Date:                  Expected Discharge Plan:  Home/Self Care  In-House Referral:     Discharge planning Services  CM Consult  Post Acute Care Choice:    Choice offered to:     DME Arranged:    DME Agency:     HH Arranged:    HH Agency:     Status of Service:  In process, will continue to follow  If discussed at Long Length of Stay Meetings, dates discussed:    Additional Comments:  Golda Acre, RN 04/26/2017, 9:53 AM

## 2017-04-26 NOTE — Progress Notes (Signed)
Inpatient Diabetes Program Recommendations  AACE/ADA: New Consensus Statement on Inpatient Glycemic Control (2015)  Target Ranges:  Prepandial:   less than 140 mg/dL      Peak postprandial:   less than 180 mg/dL (1-2 hours)      Critically ill patients:  140 - 180 mg/dL   Lab Results  Component Value Date   GLUCAP 144 (H) 04/26/2017   HGBA1C 9.2 (H) 04/25/2017    Review of Glycemic Control  Diabetes history: DM2 Outpatient Diabetes medications: Lantus 60 units QHS, Novolog 09-29-14 tidwc, metformin 1000 mg bid Current orders for Inpatient glycemic control: Lantus 32 units bid, Novolog 0-20 units tidwc and hs + 6 units tidwc   HgbA1C of 9.2% indicative of sub-optimal glycemic control prior to admission. Will need f/u with endo/PCP for insulin adjustment. Will emphasize importance of lifestyle interventions such as diet, exercise and stress management.  Inpatient Diabetes Program Recommendations:    New orders this am. May need increase in Novolog to 10 units tidwc.  Will follow.  Thank you. Ailene Ards, RD, LDN, CDE Inpatient Diabetes Coordinator [phone]

## 2017-04-26 NOTE — Progress Notes (Signed)
[facility] PROGRESS NOTE  Carla Hicks WUX:[id number] DOB: 1971/11/03 DOA: 04/24/2017 PCP: Dessa Phi, MD  Interim summary and HPI 46 year old female with a past medical history significant for hypertension, COPD on home O2 at baseline, uncontrolled type 2 diabetes (chronically on insulin), chronic renal failure, neuropathy, GERD and obesity; who presented to the ED complaining of increased wheezing, shortness of breath, productive cough and general malaise.  Patient found with concern for left lower lobe pneumonia, meeting sepsis criteria on admission, COPD exacerbation and also type II DKA.  Assessment/Plan: 1-sepsis with acute on chronic resp failure with hypoxia: due to PNA, e-coli bacteremia/UTI and asthma/COPD exacerbation. -will continue IV antibiotics, but now will narrow to Levaquin (patient allergic to penicillins, MRSA neg and growing E. Coli in her cx's) -will continue nebulizer treatment and steroids, starting steroids tapering -will continue O2 supplementation and will follow clinical response; will check O2 on ambulation/increase activity  -will follow final blood cx's and urine culture results (essentially sensitivity is pending)  2-DKA -patient DKA is now resolved -will transition off insulin drip, start lantus and SSI -diet advance to modified carb diet  -will follow A1C, still pending   3-HTN -stable and well controlled  -will continue current antihypertensive regimen   4-hyponatremia -most likely due to hyperglycemia -significantly improved/resolved after IVF's and CBG's control -will monitor electrolytes with intermittent BMET  5-acute renal failure on chronic kidney disease (stage 2 at baseline) -will continue IVF's, but rate will be adjusted and diet advanced -advice to continue good hydration  -medications adjusted for renal function -will follow Cr trend   6-obesity -Body mass index is 36.21 kg/m. -low calorie diet and exercise  discussed with patient   7-HLD -will continue statins   8-GERD -will continue PPI   9-Neuropathy/chronic hip pain -will continue neurontin and lyrica -continue PRN analgesics  10-E. Coli UTI and Bacteremia -patient endorses mild dysuria today and endorses frequency prior to admission  -given neg MRSA will stop vancomycin -cefepime switched to Levaquin (patient is PCN allergic) -will follow final results and sensitivity  -overall feeling much better and with sepsis features resolving   Code Status: DNR Family Communication: no family at bedside  Disposition Plan: will transfer to med-surg; adjust IVF rate, narrow antibiotics, continue treatment for COPD, replete electrolytes and follow clinical response.  Consultants:  None   Procedures:  See below for x-ray reports   Antibiotics:  Cefepime and vancomycin 5/5>>5/7  Levaquin 5/7  HPI/Subjective: Feeling much better today, denies CP, nausea, vomiting and abd pain. Patient with improvement in her breathing.  Objective: Vitals:   04/26/17 0757 04/26/17 0800  BP:  139/80  Pulse:  76  Resp:  20  Temp: 98.9 F (37.2 C)     Intake/Output Summary (Last 24 hours) at 04/26/17 0918 Last data filed at 04/26/17 0900  Gross per 24 hour  Intake          2777.54 ml  Output             3300 ml  Net          -522.46 ml   Filed Weights   04/25/17 0346 04/26/17 0500  Weight: 95.3 kg (210 lb) 95.7 kg (210 lb 15.7 oz)    Exam:   General:  Feeling much better and breathing easier. Afebrile now and denying CP, abd pain, nausea and vomiting. Endorses to me frequency and mild dysuria.   Cardiovascular: RRR, no rubs, no gallops, no JVD  Respiratory: still with exp wheezing and  scattered rhonchi, no crackles, no using accessory muscles and with overall improved air movement.   Abdomen: positive BS, soft, NT, ND, no guarding   Musculoskeletal: no edema, no cyanosis, patient reports pain in her left hip (which is  chronic)  Data Reviewed: Basic Metabolic Panel:  Recent Labs Lab 04/25/17 0843 04/25/17 1125 04/25/17 1552 04/25/17 2005 04/26/17 0001 04/26/17 0343  NA 126* 129* 132* 133* 132*  --   K 5.0 3.7 3.3* 3.4* 3.3*  --   CL 93* 97* 99* 99* 99*  --   CO2 17* 18* 22 23 23   --   GLUCOSE 703* 557* 355* 153* 136*  --   BUN 44* 41* 37* 34* 35*  --   CREATININE 1.85* 1.61* 1.31* 1.15* 1.01* 1.05*  CALCIUM 6.8* 6.8* 7.3* 7.6* 7.5*  --   MG 1.5*  --   --   --   --   --   PHOS 3.5  --   --   --   --   --    Liver Function Tests:  Recent Labs Lab 04/24/17 1954 04/25/17 0618  AST 73* 75*  ALT 31 31  ALKPHOS 64 62  BILITOT 2.0* 1.9*  PROT 7.4 7.2  ALBUMIN 2.7* 2.5*    Recent Labs Lab 04/24/17 2310  LIPASE 19   CBC:  Recent Labs Lab 04/24/17 1954 04/25/17 0618  WBC 5.2 7.2  NEUTROABS 4.1  --   HGB 16.0* 10.3*  HCT 46.0 30.5*  MCV 88.8 90.0  PLT 87* 131*   Cardiac Enzymes:  Recent Labs Lab 04/24/17 2310 04/25/17 0618 04/25/17 1123  TROPONINI <0.03 <0.03 <0.03   BNP (last 3 results)  Recent Labs  11/05/16 2311 02/23/17 1024 04/24/17 1954  BNP 73.9 71.0 139.8*   CBG:  Recent Labs Lab 04/26/17 0415 04/26/17 0515 04/26/17 0620 04/26/17 0726 04/26/17 0833  GLUCAP 176* 173* 160* 172* 159*    Recent Results (from the past 240 hour(s))  Blood Culture (routine x 2)     Status: None (Preliminary result)   Collection Time: 04/24/17  8:08 PM  Result Value Ref Range Status   Specimen Description BLOOD LEFT HAND  Final   Special Requests   Final    BOTTLES DRAWN AEROBIC AND ANAEROBIC Blood Culture adequate volume   Culture  Setup Time   Final    GRAM NEGATIVE RODS AEROBIC BOTTLE ONLY CRITICAL RESULT CALLED TO, READ BACK BY AND VERIFIED WITH: T PICKERING,PHARMD AT 1642 04/25/17 BY L BENFIELD Performed at [facility], [address], [place], [place] [address]    Culture GRAM NEGATIVE RODS  Final   Report Status PENDING  Incomplete  Blood Culture  ID Panel (Reflexed)     Status: Abnormal   Collection Time: 04/24/17  8:08 PM  Result Value Ref Range Status   Enterococcus species NOT DETECTED NOT DETECTED Final   Listeria monocytogenes NOT DETECTED NOT DETECTED Final   Staphylococcus species NOT DETECTED NOT DETECTED Final   Staphylococcus aureus NOT DETECTED NOT DETECTED Final   Streptococcus species NOT DETECTED NOT DETECTED Final   Streptococcus agalactiae NOT DETECTED NOT DETECTED Final   Streptococcus pneumoniae NOT DETECTED NOT DETECTED Final   Streptococcus pyogenes NOT DETECTED NOT DETECTED Final   Acinetobacter baumannii NOT DETECTED NOT DETECTED Final   Enterobacteriaceae species DETECTED (A) NOT DETECTED Final    Comment: Enterobacteriaceae represent a large family of gram-negative bacteria, not a single organism. CRITICAL RESULT CALLED TO, READ BACK BY AND VERIFIED  WITH: T PICKERING,PHARMD AT 1642 04/25/17 BY L BENFIELD    Enterobacter cloacae complex NOT DETECTED NOT DETECTED Final   Escherichia coli DETECTED (A) NOT DETECTED Final    Comment: CRITICAL RESULT CALLED TO, READ BACK BY AND VERIFIED WITH: T PICKERING,PHARMD AT 1642 04/25/17 BY L BENFIELD    Klebsiella oxytoca NOT DETECTED NOT DETECTED Final   Klebsiella pneumoniae NOT DETECTED NOT DETECTED Final   Proteus species NOT DETECTED NOT DETECTED Final   Serratia marcescens NOT DETECTED NOT DETECTED Final   Carbapenem resistance NOT DETECTED NOT DETECTED Final   Haemophilus influenzae NOT DETECTED NOT DETECTED Final   Neisseria meningitidis NOT DETECTED NOT DETECTED Final   Pseudomonas aeruginosa NOT DETECTED NOT DETECTED Final   Candida albicans NOT DETECTED NOT DETECTED Final   Candida glabrata NOT DETECTED NOT DETECTED Final   Candida krusei NOT DETECTED NOT DETECTED Final   Candida parapsilosis NOT DETECTED NOT DETECTED Final   Candida tropicalis NOT DETECTED NOT DETECTED Final    Comment: Performed at [facility], [address] [place], [place]  [address]  MRSA PCR Screening     Status: None   Collection Time: 04/25/17 12:56 AM  Result Value Ref Range Status   MRSA by PCR NEGATIVE NEGATIVE Final    Comment:        The GeneXpert MRSA Assay (FDA approved for NASAL specimens only), is one component of a comprehensive MRSA colonization surveillance program. It is not intended to diagnose MRSA infection nor to guide or monitor treatment for MRSA infections.      Studies: Ct Abdomen Pelvis Wo Contrast  Result Date: 04/24/2017 CLINICAL DATA:  Dyspnea and a diffuse abdominal pain EXAM: CT ABDOMEN AND PELVIS WITHOUT CONTRAST TECHNIQUE: Multidetector CT imaging of the abdomen and pelvis was performed following the standard protocol without IV contrast. COMPARISON:  04/16/2016 FINDINGS: Lower chest: Linear lung base scarring or atelectasis without confluent consolidation. No pleural effusions. Hepatobiliary: No focal liver abnormality is seen. No gallstones, gallbladder wall thickening, or biliary dilatation. Pancreas: There is a 5 x 5 x 7 cm collection at the pancreatic distal body, likely representing a pseudocyst. No acute inflammatory changes of the pancreas. No duct dilatation. Spleen: Normal in size without focal abnormality. Adrenals/Urinary Tract: Both adrenals are normal. There is inflammation of the right kidney with perinephric and periureteral stranding. No calculi. No mass is evident. No perinephric collection. Left kidney and ureter appear normal. Urinary bladder is distended but otherwise unremarkable. Stomach/Bowel: Stomach is within normal limits. Appendix is normal. No evidence of bowel wall thickening, distention, or inflammatory changes. Vascular/Lymphatic: No significant vascular findings are present. No enlarged abdominal or pelvic lymph nodes. Reproductive: Uterus and bilateral adnexa are unremarkable. Other: No ascites.  Fat containing low midline ventral hernia. Musculoskeletal: No significant skeletal lesion. IMPRESSION: 1.  Right renal and ureteral stranding suggesting edema. This might represent pyelonephritis. No obstructing stone or mass is evident. No perinephric collection. 2. 5 x 5 x 7 cm pancreatic collection in the region of the pancreatic body to the left of midline, most likely a pseudocyst. No acute inflammatory changes of the pancreas. 3. Fat containing low midline ventral hernia. 4. Bowel is unremarkable, with no evidence of obstruction or perforation. No acute inflammation of bowel. Electronically Signed   By: Ellery Plunk M.D.   On: 04/24/2017 23:57   Dg Chest Port 1 View  Result Date: 04/24/2017 CLINICAL DATA:  Acute shortness of breath. EXAM: PORTABLE CHEST 1 VIEW COMPARISON:  02/23/2017 FINDINGS:  Cardiomegaly noted. Mild bibasilar atelectasis noted. There is no evidence of focal airspace disease, pulmonary edema, suspicious pulmonary nodule/mass, pleural effusion, or pneumothorax. No acute bony abnormalities are identified. IMPRESSION: Cardiomegaly with mild bibasilar atelectasis. Electronically Signed   By: Harmon Pier M.D.   On: 04/24/2017 21:24   Dg Hip Unilat With Pelvis 2-3 Views Left  Result Date: 04/24/2017 CLINICAL DATA:  Nontraumatic left hip pain. EXAM: DG HIP (WITH OR WITHOUT PELVIS) 2-3V LEFT COMPARISON:  None. FINDINGS: There is no evidence of hip fracture or dislocation. There is no evidence of arthropathy or other focal bone abnormality. IMPRESSION: Negative. Electronically Signed   By: Ellery Plunk M.D.   On: 04/24/2017 23:46    Scheduled Meds: . albuterol  2.5 mg Nebulization QID  . amitriptyline  100 mg Oral QHS  . atorvastatin  20 mg Oral q1800  . cloNIDine  0.1 mg Oral BID  . fluticasone furoate-vilanterol  1 puff Inhalation BID  . furosemide  40 mg Oral BID  . guaiFENesin-codeine  10 mL Oral BID  . heparin  5,000 Units Subcutaneous Q8H  . insulin aspart  0-20 Units Subcutaneous TID WC  . insulin aspart  0-5 Units Subcutaneous QHS  . insulin glargine  25 Units  Subcutaneous BID  . methylPREDNISolone (SOLU-MEDROL) injection  40 mg Intravenous Q12H  . multivitamin with minerals  1 tablet Oral Daily  . pantoprazole  40 mg Oral QAC breakfast  . potassium chloride SA  20 mEq Oral Daily  . potassium chloride  40 mEq Oral Once  . pregabalin  50 mg Oral TID  . sodium chloride flush  3 mL Intravenous Q12H  . umeclidinium bromide  1 puff Inhalation Daily   Continuous Infusions: . 0.9 % NaCl with KCl 20 mEq / L 50 mL/hr at 04/26/17 0900  . dextrose 5 % and 0.45% NaCl 75 mL/hr at 04/26/17 0900  . levofloxacin (LEVAQUIN) IV      Time spent: 25 minutes    Vassie Loll  [facility] Pager [phone]. If 7PM-7AM, please contact night-coverage at [email or web address], password [facility] 04/26/2017, 9:18 AM  LOS: 2 days

## 2017-04-26 NOTE — Progress Notes (Signed)
Pt complaining of 7/10 hip pain. PRN tramadol given at 1454. Pt states it did not help her pain and requesting something stronger. MD paged order for 400 mg Ibuprofen ordered. Pt refused ibuprofen and said it would not work.

## 2017-04-27 DIAGNOSIS — M25552 Pain in left hip: Secondary | ICD-10-CM

## 2017-04-27 DIAGNOSIS — R000 Tachycardia, unspecified: Secondary | ICD-10-CM

## 2017-04-27 LAB — CULTURE, BLOOD (ROUTINE X 2): SPECIAL REQUESTS: ADEQUATE

## 2017-04-27 LAB — BASIC METABOLIC PANEL
ANION GAP: 11 (ref 5–15)
Anion gap: 11 (ref 5–15)
BUN: 24 mg/dL — ABNORMAL HIGH (ref 6–20)
BUN: 24 mg/dL — ABNORMAL HIGH (ref 6–20)
CALCIUM: 7.9 mg/dL — AB (ref 8.9–10.3)
CALCIUM: 7.8 mg/dL — AB (ref 8.9–10.3)
CHLORIDE: 95 mmol/L — AB (ref 101–111)
CHLORIDE: 97 mmol/L — AB (ref 101–111)
CO2: 23 mmol/L (ref 22–32)
CO2: 23 mmol/L (ref 22–32)
CREATININE: 0.98 mg/dL (ref 0.44–1.00)
CREATININE: 1.05 mg/dL — AB (ref 0.44–1.00)
GFR calc non Af Amer: >60 mL/min (ref >60)
GFR calc non Af Amer: >60 mL/min (ref >60)
GLUCOSE: 551 mg/dL — AB (ref 65–99)
Glucose, Bld: 461 mg/dL — ABNORMAL HIGH (ref 65–99)
Potassium: 3.7 mmol/L (ref 3.5–5.1)
Potassium: 3.4 mmol/L — ABNORMAL LOW (ref 3.5–5.1)
SODIUM: 131 mmol/L — AB (ref 135–145)
Sodium: 129 mmol/L — ABNORMAL LOW (ref 135–145)

## 2017-04-27 LAB — GLUCOSE, CAPILLARY
GLUCOSE-CAPILLARY: 428 mg/dL — AB (ref 65–99)
GLUCOSE-CAPILLARY: 292 mg/dL — AB (ref 65–99)
GLUCOSE-CAPILLARY: 28 mg/dL — AB (ref 65–99)
GLUCOSE-CAPILLARY: 98 mg/dL (ref 65–99)
Glucose-Capillary: 529 mg/dL (ref 65–99)
Glucose-Capillary: 517 mg/dL (ref 65–99)
Glucose-Capillary: 350 mg/dL — ABNORMAL HIGH (ref 65–99)

## 2017-04-27 LAB — MAGNESIUM: Magnesium: 1.7 mg/dL (ref 1.7–2.4)

## 2017-04-27 MED ORDER — LEVOFLOXACIN 750 MG PO TABS: 750.0000 mg | ORAL_TABLET | Freq: Every day | ORAL | Status: DC

## 2017-04-27 MED ORDER — INSULIN ASPART 100 UNIT/ML ~~LOC~~ SOLN
20.0000 [IU] | Freq: Once | SUBCUTANEOUS | Status: AC
  Administered 2017-04-27: 20 [IU] via SUBCUTANEOUS

## 2017-04-27 MED ORDER — PANTOPRAZOLE SODIUM 40 MG PO TBEC: 40.0000 mg | DELAYED_RELEASE_TABLET | Freq: Every day | ORAL | 1 refills | Status: AC

## 2017-04-27 MED ORDER — INSULIN GLARGINE 100 UNIT/ML ~~LOC~~ SOLN
32.0000 [IU] | Freq: Two times a day (BID) | SUBCUTANEOUS | Status: DC
  Administered 2017-04-27: 32 [IU] via SUBCUTANEOUS
  Filled 2017-04-27 (×2): qty 0.32

## 2017-04-27 MED ORDER — PREDNISONE 20 MG PO TABS: ORAL_TABLET | ORAL | 0 refills | Status: DC

## 2017-04-27 MED ORDER — INSULIN GLARGINE 100 UNIT/ML ~~LOC~~ SOLN: 35.0000 [IU] | Freq: Two times a day (BID) | SUBCUTANEOUS | 3 refills | Status: DC

## 2017-04-27 MED ORDER — INSULIN ASPART 100 UNIT/ML ~~LOC~~ SOLN
6.0000 [IU] | Freq: Three times a day (TID) | SUBCUTANEOUS | Status: DC
  Administered 2017-04-27 (×2): 6 [IU] via SUBCUTANEOUS

## 2017-04-27 MED ORDER — LEVOFLOXACIN 750 MG PO TABS: 750.0000 mg | ORAL_TABLET | Freq: Every day | ORAL | 0 refills | Status: AC

## 2017-04-27 MED ORDER — DEXTROSE 50 % IV SOLN
INTRAVENOUS | Status: AC
  Administered 2017-04-27: 16:00:00
  Filled 2017-04-27: qty 50

## 2017-04-27 MED ORDER — INSULIN ASPART 100 UNIT/ML ~~LOC~~ SOLN
25.0000 [IU] | Freq: Once | SUBCUTANEOUS | Status: AC
  Administered 2017-04-27: 25 [IU] via SUBCUTANEOUS

## 2017-04-27 NOTE — Progress Notes (Signed)
Pts Bg rechecked at 1645 and is 98. pts iv removed and pt assisted by nursing staff via wheelchair to front entrance. Pt denies pain at the time of discharge.

## 2017-04-27 NOTE — Progress Notes (Signed)
MD paged regarding morning blood sugar of 428.

## 2017-04-27 NOTE — Progress Notes (Signed)
Pts BS rechecked after Novolg given per [facility]. Found to be 519. MD notified.

## 2017-04-27 NOTE — Discharge Summary (Signed)
Physician Discharge Summary  Carla Hicks:[id number] DOB: [birth year] DOA: 04/24/2017  PCP: Dessa Phi, MD  Admit date: 04/24/2017 Discharge date: 04/27/2017  Time spent: 35 minutes  Recommendations for Outpatient Follow-up:  1. Repeat BMET to follow electrolytes and renal function 2. Close follow up of patient's CBG and diabetes; adjust hypoglycemic regimen as needed  3. Repeat CXR in 3-4 weeks to assure resolution of lungs infiltrates. 4. Arrange outpatient follow up with pulmonary service (will benefit of repeat PFT's)   Discharge Diagnoses:  Principal Problem:   COPD exacerbation (HCC) Active Problems:   Essential hypertension, benign   Uncontrolled type 2 diabetes mellitus with complication (HCC)   Tachycardia   Obesity, diabetes, and hypertension syndrome (HCC)   Sepsis due to Escherichia coli (E. coli) (HCC)   E coli bacteremia   E. coli UTI   Gastroesophageal reflux disease   Acute renal failure superimposed on stage 2 chronic kidney disease (HCC)   Left hip pain   Discharge Condition: stable and improved. Discharge home with instructions to follow up with PCP in 1 week  Diet recommendation: heart healthy and modified carbohydrates   Filed Weights   04/25/17 0346 04/26/17 0500  Weight: 95.3 kg (210 lb) 95.7 kg (210 lb 15.7 oz)    Brief History of present illness:  46 year old female with a past medical history significant for hypertension, COPD on home O2 at baseline, uncontrolled type 2 diabetes (chronically on insulin), chronic renal failure, neuropathy, GERD and obesity; who presented to the ED complaining of increased wheezing, shortness of breath, productive cough and general malaise.  Patient found with CXR concerning for left lower lobe pneumonia, meeting sepsis criteria on admission, COPD exacerbation, E.coli UTI/Bacteremia and also type II DKA.  Hospital Course:  1-sepsis with acute on chronic resp failure with hypoxia: due to PNA, e-coli  bacteremia/UTI and asthma/COPD exacerbation. -will discharge on levaquin, which would cover PNA and also her UTI/bacteremia from E.coli, base on culture/sensitivity results. -will discharge on steroids tapering and patient instructed to resume home inhaler therapy -will benefit of outpatient assessment by pulmonary service and repeat PFT's -will continue O2 supplementation; at discharge back to baseline 2L [place], good O2 saturation and able to speak in full sentences.  2-DKA -patient DKA is now resolved -patient advise to follow low carb diet and to be compliant with medication regimen -discharge on adjusted dose of lantus and instruction to resume her metformin and prior TID novolog -long education about importance of proper diet and not skipping meals. -A1C 9.2  3-HTN -stable and well controlled  -will continue current antihypertensive regimen  -advise to follow heart healthy diet   4-hyponatremia -most likely due to hyperglycemia -significantly improved/resolved after IVF's and CBG's control -will recommend BMET at follow up to check electrolytes  5-acute renal failure on chronic kidney disease (stage 2 at baseline) -resolved with fluid resuscitation -advice to continue good hydration  -will recommend BMET at follow up, to monitor electrolytes and renal function trend.  6-obesity -Body mass index is 36.21 kg/m. -low calorie diet and exercise discussed with patient   7-HLD -will continue statins   8-GERD -will continue PPI   9-Neuropathy/chronic hip pain -will continue neurontin and lyrica -continue PRN analgesics  10-E. Coli UTI and Bacteremia -patient with some frequency, but endorses not further dysuria at discharge. -following culture results and sensitivity, discharge on levaquin (with intention to treat for 12 days). -patient advised to keep herself well hydrated  -overall feeling much better and with sepsis features resolved  Procedures:  See below  for x-ray reports   Consultations:  None   Discharge Exam: Vitals:   04/27/17 0536 04/27/17 1343  BP: 134/89 (!) 142/104  Pulse: 82   Resp: 18 18  Temp: 97.5 F (36.4 C) 97.6 F (36.4 C)    General:  Feeling much better and breathing a lot easier. Afebrile now and denying CP, abd pain, nausea and vomiting. Endorses to have some frequency, but no further dysuria.   Cardiovascular: RRR, no rubs, no gallops, no JVD  Respiratory: still with mild exp wheezing and scattered rhonchi, no crackles, no using accessory muscles and with overall improved air movement. Back to baseline oxygen supplementation.  Abdomen: positive BS, soft, NT, ND, no guarding   Musculoskeletal: no edema, no cyanosis, patient reports pain in her left hip (which is chronic and intermittent)  Discharge Instructions   Discharge Instructions    Diet - low sodium heart healthy    Complete by:  As directed    Diet Carb Modified    Complete by:  As directed    Discharge instructions    Complete by:  As directed    Take medications as prescribed  Please follow up with PCP in 1 week Keep yourself well hydrated Follow heart healthy diet and modified carbohydrates   Increase activity slowly    Complete by:  As directed      Current Discharge Medication List    START taking these medications   Details  levofloxacin (LEVAQUIN) 750 MG tablet Take 1 tablet (750 mg total) by mouth daily. Qty: 12 tablet, Refills: 0    pantoprazole (PROTONIX) 40 MG tablet Take 1 tablet (40 mg total) by mouth daily before breakfast. Qty: 30 tablet, Refills: 1    predniSONE (DELTASONE) 20 MG tablet Take 3 tablets by mouth daily X 1 day; then 2 tablets by mouth daily X 2 days; then 1 tablet by mouth daily X 3 days; then 1/2 tablet by mouth daily X 3 days and stopped prednisone. Qty: 15 tablet, Refills: 0      CONTINUE these medications which have CHANGED   Details  insulin glargine (LANTUS) 100 UNIT/ML injection Inject 0.35  mLs (35 Units total) into the skin 2 (two) times daily. Qty: 90 mL, Refills: 3   Associated Diagnoses: Uncontrolled type 2 diabetes mellitus with complication, with long-term current use of insulin (HCC)      CONTINUE these medications which have NOT CHANGED   Details  albuterol (PROVENTIL HFA;VENTOLIN HFA) 108 (90 Base) MCG/ACT inhaler Inhale 2 puffs into the lungs every 4 (four) hours as needed for wheezing or shortness of breath. Qty: 54 g, Refills: 3    amitriptyline (ELAVIL) 100 MG tablet Take 1 tablet (100 mg total) by mouth at bedtime. Qty: 30 tablet, Refills: 2   Associated Diagnoses: Diabetic polyneuropathy associated with type 2 diabetes mellitus (HCC)    atorvastatin (LIPITOR) 20 MG tablet TAKE 1 TABLET BY MOUTH DAILY AT 6 PM Qty: 30 tablet, Refills: 5    BREO ELLIPTA 100-25 MCG/INH AEPB Take 1 puff by mouth 2 (two) times daily. Refills: 3    cloNIDine (CATAPRES) 0.1 MG tablet Take 1 tablet (0.1 mg total) by mouth 2 (two) times daily. Qty: 60 tablet, Refills: 11    furosemide (LASIX) 40 MG tablet Take 1 tablet (40 mg total) by mouth 2 (two) times daily. Qty: 60 tablet, Refills: 2    guaiFENesin-codeine 100-10 MG/5ML syrup Take 10 mLs by mouth 2 (two) times daily. Qty:  600 mL, Refills: 2   Associated Diagnoses: Chronic bronchitis, unspecified chronic bronchitis type (HCC)    insulin aspart (NOVOLOG) 100 UNIT/ML injection Inject 10 U with breakfast and  lunch, 15 U with dinner Qty: 10 mL, Refills: 4   Associated Diagnoses: Uncontrolled type 2 diabetes mellitus with complication, with long-term current use of insulin (HCC)    ipratropium-albuterol (DUONEB) 0.5-2.5 (3) MG/3ML SOLN Take 3 mLs by nebulization every 6 (six) hours as needed (sob/doe). Qty: 360 mL, Refills: 3    metFORMIN (GLUCOPHAGE) 1000 MG tablet Take 1 tablet (1,000 mg total) by mouth 2 (two) times daily with a meal. Qty: 180 tablet, Refills: 3   Associated Diagnoses: Uncontrolled type 2 diabetes mellitus  with complication, with long-term current use of insulin (HCC)    Multiple Vitamin (MULTIVITAMIN WITH MINERALS) TABS tablet Take 1 tablet by mouth daily.    Potassium Chloride ER 20 MEQ TBCR Take 20 mEq by mouth daily. Qty: 30 tablet, Refills: 2    pregabalin (LYRICA) 50 MG capsule Take 1 capsule (50 mg total) by mouth 3 (three) times daily. Qty: 270 capsule, Refills: 3   Associated Diagnoses: Diabetic polyneuropathy associated with type 2 diabetes mellitus (HCC)    acetaminophen-codeine (TYLENOL #3) 300-30 MG tablet Take 1-2 tablets by mouth 2 (two) times daily as needed for moderate pain. Qty: 60 tablet, Refills: 0    budesonide-formoterol (SYMBICORT) 80-4.5 MCG/ACT inhaler Inhale 2 puffs into the lungs 2 (two) times daily. Take 2 puffs first thing in am and then another 2 puffs about 12 hours later. Qty: 3 Inhaler, Refills: 3   Associated Diagnoses: Chronic bronchitis, unspecified chronic bronchitis type (HCC)    gabapentin (NEURONTIN) 300 MG capsule Take 3 capsules (900 mg total) by mouth 4 (four) times daily. Qty: 360 capsule, Refills: 5   Associated Diagnoses: Diabetic polyneuropathy associated with type 2 diabetes mellitus (HCC)    gemfibrozil (LOPID) 600 MG tablet Take 1 tablet (600 mg total) by mouth 2 (two) times daily before a meal. Qty: 60 tablet, Refills: 12    ibuprofen (ADVIL) 200 MG tablet Take 1-2 tablets (200-400 mg total) by mouth every 6 (six) hours as needed for mild pain or moderate pain. Qty: 30 tablet, Refills: 0      STOP taking these medications     doxycycline (VIBRA-TABS) 100 MG tablet      insulin lispro (HUMALOG KWIKPEN) 100 UNIT/ML KiwkPen        Allergies  Allergen Reactions  . Penicillins Hives    +++++++Has tolerated cephalosporins++++++++ Has patient had a PCN reaction causing immediate rash, facial/tongue/throat swelling, SOB or lightheadedness with hypotension: Yes Has patient had a PCN reaction causing severe rash involving mucus  membranes or skin necrosis: No Has patient had a PCN reaction that required hospitalization: Yes Has patient had a PCN reaction occurring within the last 10 years: No If all of the above answers are "NO", then may proceed with Cephalosporin use.   . Robitussin Liquid Center [Menthol] Hives   Follow-up Information    Funches, Gerilyn Nestle, MD. Schedule an appointment as soon as possible for a visit in 1 week(s).   Specialty:  Family Medicine Contact information: [address] [place] [address] [phone]            The results of significant diagnostics from this hospitalization (including imaging, microbiology, ancillary and laboratory) are listed below for reference.    Significant Diagnostic Studies: Ct Abdomen Pelvis Wo Contrast  Result Date: 04/24/2017 CLINICAL DATA:  Dyspnea and a diffuse abdominal pain EXAM: CT ABDOMEN AND PELVIS WITHOUT CONTRAST TECHNIQUE: Multidetector CT imaging of the abdomen and pelvis was performed following the standard protocol without IV contrast. COMPARISON:  04/16/2016 FINDINGS: Lower chest: Linear lung base scarring or atelectasis without confluent consolidation. No pleural effusions. Hepatobiliary: No focal liver abnormality is seen. No gallstones, gallbladder wall thickening, or biliary dilatation. Pancreas: There is a 5 x 5 x 7 cm collection at the pancreatic distal body, likely representing a pseudocyst. No acute inflammatory changes of the pancreas. No duct dilatation. Spleen: Normal in size without focal abnormality. Adrenals/Urinary Tract: Both adrenals are normal. There is inflammation of the right kidney with perinephric and periureteral stranding. No calculi. No mass is evident. No perinephric collection. Left kidney and ureter appear normal. Urinary bladder is distended but otherwise unremarkable. Stomach/Bowel: Stomach is within normal limits. Appendix is normal. No evidence of bowel wall thickening, distention, or inflammatory changes.  Vascular/Lymphatic: No significant vascular findings are present. No enlarged abdominal or pelvic lymph nodes. Reproductive: Uterus and bilateral adnexa are unremarkable. Other: No ascites.  Fat containing low midline ventral hernia. Musculoskeletal: No significant skeletal lesion. IMPRESSION: 1. Right renal and ureteral stranding suggesting edema. This might represent pyelonephritis. No obstructing stone or mass is evident. No perinephric collection. 2. 5 x 5 x 7 cm pancreatic collection in the region of the pancreatic body to the left of midline, most likely a pseudocyst. No acute inflammatory changes of the pancreas. 3. Fat containing low midline ventral hernia. 4. Bowel is unremarkable, with no evidence of obstruction or perforation. No acute inflammation of bowel. Electronically Signed   By: Ellery Plunk M.D.   On: 04/24/2017 23:57   Dg Chest Port 1 View  Result Date: 04/24/2017 CLINICAL DATA:  Acute shortness of breath. EXAM: PORTABLE CHEST 1 VIEW COMPARISON:  02/23/2017 FINDINGS: Cardiomegaly noted. Mild bibasilar atelectasis noted. There is no evidence of focal airspace disease, pulmonary edema, suspicious pulmonary nodule/mass, pleural effusion, or pneumothorax. No acute bony abnormalities are identified. IMPRESSION: Cardiomegaly with mild bibasilar atelectasis. Electronically Signed   By: Harmon Pier M.D.   On: 04/24/2017 21:24   Dg Hip Unilat With Pelvis 2-3 Views Left  Result Date: 04/24/2017 CLINICAL DATA:  Nontraumatic left hip pain. EXAM: DG HIP (WITH OR WITHOUT PELVIS) 2-3V LEFT COMPARISON:  None. FINDINGS: There is no evidence of hip fracture or dislocation. There is no evidence of arthropathy or other focal bone abnormality. IMPRESSION: Negative. Electronically Signed   By: Ellery Plunk M.D.   On: 04/24/2017 23:46    Microbiology: Recent Results (from the past 240 hour(s))  Blood Culture (routine x 2)     Status: None (Preliminary result)   Collection Time: 04/24/17  8:05 PM   Result Value Ref Range Status   Specimen Description LEFT ANTECUBITAL  Final   Special Requests   Final    BOTTLES DRAWN AEROBIC AND ANAEROBIC Blood Culture adequate volume   Culture   Final    NO GROWTH 2 DAYS Performed at [facility], [address] [place], [place] [address]    Report Status PENDING  Incomplete  Blood Culture (routine x 2)     Status: Abnormal   Collection Time: 04/24/17  8:08 PM  Result Value Ref Range Status   Specimen Description BLOOD LEFT HAND  Final   Special Requests   Final    BOTTLES DRAWN AEROBIC AND ANAEROBIC Blood Culture adequate volume   Culture  Setup Time   Final  GRAM NEGATIVE RODS AEROBIC BOTTLE ONLY CRITICAL RESULT CALLED TO, READ BACK BY AND VERIFIED WITH: T PICKERING,PHARMD AT 1642 04/25/17 BY L BENFIELD Performed at [facility], [address], [place], [place] [address]    Culture ESCHERICHIA COLI (A)  Final   Report Status 04/27/2017 FINAL  Final   Organism ID, Bacteria ESCHERICHIA COLI  Final      Susceptibility   Escherichia coli - MIC*    AMPICILLIN >=32 RESISTANT Resistant     CEFAZOLIN <=4 SENSITIVE Sensitive     CEFEPIME <=1 SENSITIVE Sensitive     CEFTAZIDIME <=1 SENSITIVE Sensitive     CEFTRIAXONE <=1 SENSITIVE Sensitive     CIPROFLOXACIN <=0.25 SENSITIVE Sensitive     GENTAMICIN <=1 SENSITIVE Sensitive     IMIPENEM <=0.25 SENSITIVE Sensitive     TRIMETH/SULFA >=320 RESISTANT Resistant     AMPICILLIN/SULBACTAM 4 SENSITIVE Sensitive     PIP/TAZO <=4 SENSITIVE Sensitive     Extended ESBL NEGATIVE Sensitive     * ESCHERICHIA COLI  Blood Culture ID Panel (Reflexed)     Status: Abnormal   Collection Time: 04/24/17  8:08 PM  Result Value Ref Range Status   Enterococcus species NOT DETECTED NOT DETECTED Final   Listeria monocytogenes NOT DETECTED NOT DETECTED Final   Staphylococcus species NOT DETECTED NOT DETECTED Final   Staphylococcus aureus NOT DETECTED NOT DETECTED Final   Streptococcus species NOT  DETECTED NOT DETECTED Final   Streptococcus agalactiae NOT DETECTED NOT DETECTED Final   Streptococcus pneumoniae NOT DETECTED NOT DETECTED Final   Streptococcus pyogenes NOT DETECTED NOT DETECTED Final   Acinetobacter baumannii NOT DETECTED NOT DETECTED Final   Enterobacteriaceae species DETECTED (A) NOT DETECTED Final    Comment: Enterobacteriaceae represent a large family of gram-negative bacteria, not a single organism. CRITICAL RESULT CALLED TO, READ BACK BY AND VERIFIED WITH: T PICKERING,PHARMD AT 1642 04/25/17 BY L BENFIELD    Enterobacter cloacae complex NOT DETECTED NOT DETECTED Final   Escherichia coli DETECTED (A) NOT DETECTED Final    Comment: CRITICAL RESULT CALLED TO, READ BACK BY AND VERIFIED WITH: T PICKERING,PHARMD AT 1642 04/25/17 BY L BENFIELD    Klebsiella oxytoca NOT DETECTED NOT DETECTED Final   Klebsiella pneumoniae NOT DETECTED NOT DETECTED Final   Proteus species NOT DETECTED NOT DETECTED Final   Serratia marcescens NOT DETECTED NOT DETECTED Final   Carbapenem resistance NOT DETECTED NOT DETECTED Final   Haemophilus influenzae NOT DETECTED NOT DETECTED Final   Neisseria meningitidis NOT DETECTED NOT DETECTED Final   Pseudomonas aeruginosa NOT DETECTED NOT DETECTED Final   Candida albicans NOT DETECTED NOT DETECTED Final   Candida glabrata NOT DETECTED NOT DETECTED Final   Candida krusei NOT DETECTED NOT DETECTED Final   Candida parapsilosis NOT DETECTED NOT DETECTED Final   Candida tropicalis NOT DETECTED NOT DETECTED Final    Comment: Performed at [facility], [address] [place], [place] [address]  MRSA PCR Screening     Status: None   Collection Time: 04/25/17 12:56 AM  Result Value Ref Range Status   MRSA by PCR NEGATIVE NEGATIVE Final    Comment:        The GeneXpert MRSA Assay (FDA approved for NASAL specimens only), is one component of a comprehensive MRSA colonization surveillance program. It is not intended to diagnose MRSA infection  nor to guide or monitor treatment for MRSA infections.   Urine culture     Status: Abnormal   Collection Time: 04/25/17  1:44 AM  Result Value Ref Range Status   Specimen Description URINE, RANDOM  Final   Special Requests NONE  Final   Culture MULTIPLE SPECIES PRESENT, SUGGEST RECOLLECTION (A)  Final   Report Status 04/26/2017 FINAL  Final     Labs: Basic Metabolic Panel:  Recent Labs Lab 04/25/17 0843  04/25/17 1552 04/25/17 2005 04/26/17 0001 04/26/17 0343 04/27/17 0821 04/27/17 1107  NA 126*  < > 132* 133* 132*  --  129* 131*  K 5.0  < > 3.3* 3.4* 3.3*  --  3.7 3.4*  CL 93*  < > 99* 99* 99*  --  95* 97*  CO2 17*  < > 22 23 23   --  23 23  GLUCOSE 703*  < > 355* 153* 136*  --  551* 461*  BUN 44*  < > 37* 34* 35*  --  24* 24*  CREATININE 1.85*  < > 1.31* 1.15* 1.01* 1.05* 0.98 1.05*  CALCIUM 6.8*  < > 7.3* 7.6* 7.5*  --  7.9* 7.8*  MG 1.5*  --   --   --   --   --  1.7  --   PHOS 3.5  --   --   --   --   --   --   --   < > = values in this interval not displayed. Liver Function Tests:  Recent Labs Lab 04/24/17 1954 04/25/17 0618  AST 73* 75*  ALT 31 31  ALKPHOS 64 62  BILITOT 2.0* 1.9*  PROT 7.4 7.2  ALBUMIN 2.7* 2.5*    Recent Labs Lab 04/24/17 2310  LIPASE 19   No results for input(s): AMMONIA in the last 168 hours. CBC:  Recent Labs Lab 04/24/17 1954 04/25/17 0618  WBC 5.2 7.2  NEUTROABS 4.1  --   HGB 16.0* 10.3*  HCT 46.0 30.5*  MCV 88.8 90.0  PLT 87* 131*   Cardiac Enzymes:  Recent Labs Lab 04/24/17 2310 04/25/17 0618 04/25/17 1123  TROPONINI <0.03 <0.03 <0.03   BNP: BNP (last 3 results)  Recent Labs  11/05/16 2311 02/23/17 1024 04/24/17 1954  BNP 73.9 71.0 139.8*    ProBNP (last 3 results) No results for input(s): PROBNP in the last 8760 hours.  CBG:  Recent Labs Lab 04/27/17 0728 04/27/17 0902 04/27/17 1016 04/27/17 1159 04/27/17 1312  GLUCAP 428* 529* 517* 350* 292*       Signed:  Vassie Loll MD.   [facility] 04/27/2017, 3:44 PM

## 2017-04-27 NOTE — Progress Notes (Signed)
Hypoglycemic Event  CBG: 28  Treatment: D50 IV 50 mL  Symptoms: Sweaty  Follow-up CBG: Time: CBG Result:  Possible Reasons for Event: Inadequate meal intake  Comments/MD notified: MD present on floor, notified    Rocco Kerkhoff J

## 2017-04-29 ENCOUNTER — Other Ambulatory Visit: Payer: Self-pay

## 2017-04-29 MED ORDER — INSULIN GLARGINE 100 UNITS/ML SOLOSTAR PEN
60.0000 [IU] | PEN_INJECTOR | Freq: Every day | SUBCUTANEOUS | 3 refills | Status: DC
Start: 2017-04-29 — End: 2017-05-03

## 2017-04-30 LAB — CULTURE, BLOOD (ROUTINE X 2)
CULTURE: NO GROWTH
SPECIAL REQUESTS: ADEQUATE

## 2017-05-03 ENCOUNTER — Other Ambulatory Visit: Payer: Self-pay | Admitting: Family Medicine

## 2017-05-03 ENCOUNTER — Other Ambulatory Visit: Payer: Self-pay | Admitting: Pharmacist

## 2017-05-03 MED ORDER — INSULIN GLARGINE 100 UNITS/ML SOLOSTAR PEN: 60.0000 [IU] | PEN_INJECTOR | Freq: Every day | SUBCUTANEOUS | 3 refills | Status: AC

## 2017-05-03 MED FILL — $LANTUS SOLOSTAR 100 UNITS/: 100 | 30 days supply | Qty: 18 | Fill #0

## 2017-05-03 MED FILL — ATORVASTATIN 20 MG TABLET: 20 | 30 days supply | Qty: 30 | Fill #0

## 2017-05-03 MED FILL — ?METFORMIN HCL 1,000 MG TAB: 1000 | 30 days supply | Qty: 60 | Fill #5

## 2017-05-03 MED FILL — AMITRIPTYLINE HCL 100 MG TA: 100 | 30 days supply | Qty: 30 | Fill #1

## 2017-05-03 MED FILL — $BREO ELLIPTA 100-25 MCG IH: 100-25 MCG | 30 days supply | Qty: 60 | Fill #1

## 2017-05-03 MED FILL — $HUMALOG 100 UNITS/ML KWIKP: 100 | 42 days supply | Qty: 15 | Fill #0

## 2017-05-03 MED FILL — $VENTOLIN HFA 18G INHALER: 108 (90 BAS | 16 days supply | Qty: 18 | Fill #0

## 2017-05-05 ENCOUNTER — Encounter: Payer: Self-pay | Admitting: Family Medicine

## 2017-05-05 NOTE — Progress Notes (Signed)
Subjective:  Patient ID: Carla Hicks, female    DOB: January 13, 1971  Age: 46 y.o. MRN: [id number]  CC: Hospitalization Follow-up   HPI Carla Hicks has HTN, DM2, COPD, hx of smoking quit after smoking 1/2 for 25 years, and alcohol abuse she presents for   1. Hospitalization follow up COPD: She was hospitalized from 5/5-04/27/17 for COPD exacerbation along with DKA and sepsis due to pneumonia and E. Coli UTI/bactermia. She was treated with insulin, IV fluids, IV antibiotics and steroids. She was discharged with a course of Levaquin, tapered steroids and her home regimen of insulin and Symbicort. She has not yet started prednisone taper or levaquin she reports she is getting them filled today.   1. CHRONIC DIABETES  Disease Monitoring  Blood Sugar Ranges:  49- Hi  Polyuria: no   Visual problems: no   Medication Compliance: 10 U of novolog with breakfast, 10 U with lunch and 15 U dinner.  Medication Side Effects[  Hypoglycemia: no   She quit drinking alcohol and smoking in 01/2017.   2. Diabetic neuropathy: with pain since since May 2017. Has swelling sensation in R foot and tingling in her foot. She has numbness in both feet. She has sores on the dorsal aspect of her toes on her R foot. There is no redness or drainage. She reports that her regimen of tylenol #3, elavil 100 mg nightly and lyrica 50 mg TID  is not controlling her pain.     Social History  Substance Use Topics  . Smoking status: Former Smoker    Packs/day: 0.50    Years: 25.00    Types: Cigarettes  . Smokeless tobacco: Never Used  . Alcohol use No     Comment: a 40oz or a 12 pack of beer per day 02/10/17. 04/24/17 Pt reports she quit     Outpatient Medications Prior to Visit  Medication Sig Dispense Refill  . acetaminophen-codeine (TYLENOL #3) 300-30 MG tablet Take 1-2 tablets by mouth 2 (two) times daily as needed for moderate pain. (Patient not taking: Reported on 04/24/2017) 60 tablet 0  . albuterol (PROVENTIL  HFA;VENTOLIN HFA) 108 (90 Base) MCG/ACT inhaler Inhale 2 puffs into the lungs every 4 (four) hours as needed for wheezing or shortness of breath. 54 g 3  . amitriptyline (ELAVIL) 100 MG tablet Take 1 tablet (100 mg total) by mouth at bedtime. 30 tablet 2  . atorvastatin (LIPITOR) 20 MG tablet TAKE 1 TABLET BY MOUTH DAILY AT 6 PM 30 tablet 3  . BREO ELLIPTA 100-25 MCG/INH AEPB Take 1 puff by mouth 2 (two) times daily.  3  . budesonide-formoterol (SYMBICORT) 80-4.5 MCG/ACT inhaler Inhale 2 puffs into the lungs 2 (two) times daily. Take 2 puffs first thing in am and then another 2 puffs about 12 hours later. (Patient not taking: Reported on 04/24/2017) 3 Inhaler 3  . cloNIDine (CATAPRES) 0.1 MG tablet Take 1 tablet (0.1 mg total) by mouth 2 (two) times daily. 60 tablet 11  . furosemide (LASIX) 40 MG tablet Take 1 tablet (40 mg total) by mouth 2 (two) times daily. 60 tablet 2  . gabapentin (NEURONTIN) 300 MG capsule Take 3 capsules (900 mg total) by mouth 4 (four) times daily. (Patient not taking: Reported on 04/24/2017) 360 capsule 5  . gemfibrozil (LOPID) 600 MG tablet Take 1 tablet (600 mg total) by mouth 2 (two) times daily before a meal. (Patient not taking: Reported on 04/24/2017) 60 tablet 12  . guaiFENesin-codeine 100-10 MG/5ML  syrup Take 10 mLs by mouth 2 (two) times daily. 600 mL 2  . ibuprofen (ADVIL) 200 MG tablet Take 1-2 tablets (200-400 mg total) by mouth every 6 (six) hours as needed for mild pain or moderate pain. (Patient not taking: Reported on 04/24/2017) 30 tablet 0  . insulin aspart (NOVOLOG) 100 UNIT/ML injection Inject 10 U with breakfast and  lunch, 15 U with dinner (Patient taking differently: Inject 10-15 Units into the skin 3 (three) times daily with meals. 10 units with breakfast then 10 units with lunch then 15 units with dinner (evening meal)) 10 mL 4  . insulin glargine (LANTUS) 100 unit/mL SOPN Inject 0.6 mLs (60 Units total) into the skin at bedtime. 90 mL 3  .  ipratropium-albuterol (DUONEB) 0.5-2.5 (3) MG/3ML SOLN Take 3 mLs by nebulization every 6 (six) hours as needed (sob/doe). 360 mL 3  . levofloxacin (LEVAQUIN) 750 MG tablet Take 1 tablet (750 mg total) by mouth daily. 12 tablet 0  . metFORMIN (GLUCOPHAGE) 1000 MG tablet Take 1 tablet (1,000 mg total) by mouth 2 (two) times daily with a meal. 180 tablet 3  . Multiple Vitamin (MULTIVITAMIN WITH MINERALS) TABS tablet Take 1 tablet by mouth daily.    . pantoprazole (PROTONIX) 40 MG tablet Take 1 tablet (40 mg total) by mouth daily before breakfast. 30 tablet 1  . Potassium Chloride ER 20 MEQ TBCR Take 20 mEq by mouth daily. 30 tablet 2  . predniSONE (DELTASONE) 20 MG tablet Take 3 tablets by mouth daily X 1 day; then 2 tablets by mouth daily X 2 days; then 1 tablet by mouth daily X 3 days; then 1/2 tablet by mouth daily X 3 days and stopped prednisone. 15 tablet 0  . pregabalin (LYRICA) 50 MG capsule Take 1 capsule (50 mg total) by mouth 3 (three) times daily. 270 capsule 3   No facility-administered medications prior to visit.     ROS Review of Systems  Constitutional: Negative for chills and fever.  Eyes: Negative for visual disturbance.  Respiratory: Positive for cough and shortness of breath.   Cardiovascular: Negative for chest pain and leg swelling.  Gastrointestinal: Negative for abdominal pain and blood in stool.  Endocrine: Negative for polyuria.  Genitourinary: Negative for dysuria.  Musculoskeletal: Positive for myalgias. Negative for arthralgias and back pain.  Skin: Positive for wound. Negative for rash.  Allergic/Immunologic: Negative for immunocompromised state.  Hematological: Negative for adenopathy. Does not bruise/bleed easily.  Psychiatric/Behavioral: Negative for dysphoric mood and suicidal ideas.    Objective:  BP 110/80   Pulse (!) 103   Temp 98.1 F (36.7 C) (Oral)   Wt 201 lb 12.8 oz (91.5 kg)   LMP  (LMP Unknown)   SpO2 96%   BMI 34.64 kg/m   BP/Weight  05/06/2017 04/27/2017 04/26/2017  Systolic BP 110 142 -  Diastolic BP 80 104 -  Wt. (Lbs) 201.8 - 210.98  BMI 34.64 - 36.21    Physical Exam  Constitutional: She is oriented to person, place, and time. She appears well-developed and well-nourished. No distress.  Obese   HENT:  Head: Normocephalic and atraumatic.  Cardiovascular: Normal rate, regular rhythm, normal heart sounds and intact distal pulses.   Pulmonary/Chest: Effort normal and breath sounds normal.  Musculoskeletal: She exhibits no edema.       Left lower leg: She exhibits tenderness (no palpable cords ).       Feet:  Neurological: She is alert and oriented to person, place, and time.  Skin: Skin is warm and dry. No rash noted.  Psychiatric: She has a normal mood and affect.    Lab Results  Component Value Date   HGBA1C 9.2 (H) 04/25/2017   CBG 435   Treated with 20 U of novolog  Repeat CBG 443 Assessment & Plan:   Missey was seen today for hospitalization follow-up.  Diagnoses and all orders for this visit:  Uncontrolled type 2 diabetes mellitus with complication, with long-term current use of insulin (HCC) -     POCT glucose (manual entry) -     insulin aspart (novoLOG) injection 20 Units; Inject 0.2 mLs (20 Units total) into the skin once. -     POCT urinalysis dipstick -     amitriptyline (ELAVIL) 100 MG tablet; Take 1 tablet (100 mg total) by mouth at bedtime. -     insulin aspart (NOVOLOG) 100 UNIT/ML injection; Inject 15 U with breakfast and  lunch, 10 U with dinner -     POCT glucose (manual entry)  Diabetic polyneuropathy associated with type 2 diabetes mellitus (HCC) -     amitriptyline (ELAVIL) 100 MG tablet; Take 1 tablet (100 mg total) by mouth at bedtime. -     acetaminophen-codeine (TYLENOL #4) 300-60 MG tablet; Take 1 tablet by mouth 2 (two) times daily.    No orders of the defined types were placed in this encounter.   Follow-up: Return in about 3 weeks (around 05/27/2017) for pap smear .    Dessa Phi MD

## 2017-05-06 ENCOUNTER — Ambulatory Visit: Payer: Self-pay | Attending: Family Medicine | Admitting: Family Medicine

## 2017-05-06 ENCOUNTER — Encounter: Payer: Self-pay | Admitting: Family Medicine

## 2017-05-06 VITALS — BP 110/80 | HR 103 | Temp 98.1°F | Wt 201.8 lb

## 2017-05-06 DIAGNOSIS — E118 Type 2 diabetes mellitus with unspecified complications: Secondary | ICD-10-CM

## 2017-05-06 DIAGNOSIS — IMO0002 Reserved for concepts with insufficient information to code with codable children: Secondary | ICD-10-CM

## 2017-05-06 DIAGNOSIS — B9620 Unspecified Escherichia coli [E. coli] as the cause of diseases classified elsewhere: Secondary | ICD-10-CM

## 2017-05-06 DIAGNOSIS — J441 Chronic obstructive pulmonary disease with (acute) exacerbation: Secondary | ICD-10-CM

## 2017-05-06 DIAGNOSIS — E1142 Type 2 diabetes mellitus with diabetic polyneuropathy: Secondary | ICD-10-CM

## 2017-05-06 DIAGNOSIS — J449 Chronic obstructive pulmonary disease, unspecified: Secondary | ICD-10-CM | POA: Insufficient documentation

## 2017-05-06 DIAGNOSIS — A4151 Sepsis due to Escherichia coli [E. coli]: Secondary | ICD-10-CM

## 2017-05-06 DIAGNOSIS — Z794 Long term (current) use of insulin: Secondary | ICD-10-CM | POA: Insufficient documentation

## 2017-05-06 DIAGNOSIS — Z87891 Personal history of nicotine dependence: Secondary | ICD-10-CM | POA: Insufficient documentation

## 2017-05-06 DIAGNOSIS — I10 Essential (primary) hypertension: Secondary | ICD-10-CM | POA: Insufficient documentation

## 2017-05-06 DIAGNOSIS — N390 Urinary tract infection, site not specified: Secondary | ICD-10-CM

## 2017-05-06 DIAGNOSIS — E1165 Type 2 diabetes mellitus with hyperglycemia: Secondary | ICD-10-CM | POA: Insufficient documentation

## 2017-05-06 DIAGNOSIS — Z79899 Other long term (current) drug therapy: Secondary | ICD-10-CM | POA: Insufficient documentation

## 2017-05-06 LAB — GLUCOSE, POCT (MANUAL RESULT ENTRY)
POC GLUCOSE: 435 mg/dL — AB (ref 70–99)
POC Glucose: 443 mg/dl — AB (ref 70–99)

## 2017-05-06 MED ORDER — INSULIN ASPART 100 UNIT/ML ~~LOC~~ SOLN: SUBCUTANEOUS | 4 refills | Status: AC

## 2017-05-06 MED ORDER — INSULIN ASPART 100 UNIT/ML ~~LOC~~ SOLN
20.0000 [IU] | Freq: Once | SUBCUTANEOUS | Status: AC
  Administered 2017-05-06: 20 [IU] via SUBCUTANEOUS

## 2017-05-06 MED ORDER — ACETAMINOPHEN-CODEINE #4 300-60 MG PO TABS: 1.0000 | ORAL_TABLET | Freq: Two times a day (BID) | ORAL | 0 refills | Status: DC

## 2017-05-06 MED ORDER — AMITRIPTYLINE HCL 100 MG PO TABS: 100.0000 mg | ORAL_TABLET | Freq: Every day | ORAL | 2 refills | Status: AC

## 2017-05-06 MED FILL — levoFLOXacin 750 MG TABS: 750 | 12 days supply | Qty: 12 | Fill #0

## 2017-05-06 MED FILL — PANTOPRAZOLE SOD DR 40 MG T: 40 | 30 days supply | Qty: 30 | Fill #0

## 2017-05-06 MED FILL — predniSONE 20 MG TABS: 20 | 9 days supply | Qty: 15 | Fill #0

## 2017-05-06 NOTE — Assessment & Plan Note (Signed)
Patient advised to finish levaquin course  Since she is not coughing or having SOB and has elevated CBG She is advised against prednisone taper

## 2017-05-06 NOTE — Patient Instructions (Addendum)
Carla Hicks was seen today for hospitalization follow-up.  Diagnoses and all orders for this visit:  Uncontrolled type 2 diabetes mellitus with complication, with long-term current use of insulin (HCC) -     POCT glucose (manual entry) -     insulin aspart (novoLOG) injection 20 Units; Inject 0.2 mLs (20 Units total) into the skin once. -     POCT urinalysis dipstick -     amitriptyline (ELAVIL) 100 MG tablet; Take 1 tablet (100 mg total) by mouth at bedtime. -     insulin aspart (NOVOLOG) 100 UNIT/ML injection; Inject 15 U with breakfast and  lunch, 10 U with dinner  Diabetic polyneuropathy associated with type 2 diabetes mellitus (HCC) -     amitriptyline (ELAVIL) 100 MG tablet; Take 1 tablet (100 mg total) by mouth at bedtime.   Do not take prednisone as your lung exam is normal and prednisone will raise your blood sugar Do take the Levaquin  Take 15 U of novolog with breakfast and lunch, 10 U with dinner to avoid low sugars in early AM Take 60 U of lantus Since your appetite is poor try to have a meal supplement like glucerna or carnation instant breakfast (low sugar)   F/u in 3 weeks for pap smear   Dr. Armen Pickup

## 2017-05-06 NOTE — Assessment & Plan Note (Signed)
A; uncontrolled with labile CBGs P: Change novolog to 15 U with breakfast and lunch, 10 U with dinner lantus 60 U  Advised low carb meal supplement

## 2017-05-06 NOTE — Assessment & Plan Note (Signed)
Patient advised to finish levaquin course

## 2017-05-06 NOTE — Assessment & Plan Note (Signed)
Patient advised to finish levaquin course  

## 2017-05-06 NOTE — Assessment & Plan Note (Signed)
A: diabetic neuropathy with uncontrolled diabetes P: Continue elavil 100 mg nightly lyrica 50 mg TID Tylenol #4 BID Work on blood sugar reduction

## 2017-05-24 MED FILL — ?FUROSEMIDE 40 MG TABLET: 40 | 30 days supply | Qty: 60 | Fill #1

## 2017-05-24 MED FILL — POTASSIUM CL ER 20 MEQ TAB: 20 | 30 days supply | Qty: 30 | Fill #0

## 2017-05-24 MED FILL — GUAIATUSSIN AC LIQUID: 100-10 | 23 days supply | Qty: 473 | Fill #1

## 2017-05-24 MED FILL — PROVENTIL HFA 108 (90 BASE): 108 (90 BAS | 15 days supply | Qty: 7 | Fill #1

## 2017-06-03 MED FILL — GABAPENTIN 300 MG CAPSULE: 300 | 30 days supply | Qty: 360 | Fill #2

## 2017-06-03 MED FILL — AMITRIPTYLINE HCL 100 MG TA: 100 | 30 days supply | Qty: 30 | Fill #0

## 2017-06-07 ENCOUNTER — Telehealth: Payer: Self-pay | Admitting: Family Medicine

## 2017-06-07 NOTE — Telephone Encounter (Signed)
PT came to the office to request a refill for cloNIDine (CATAPRES) 0.1 MG tablet   and to change her TYLENOL #4 to a Tylenol #3   since the #4 is too expensive for her, please follow up with PT

## 2017-06-08 ENCOUNTER — Ambulatory Visit: Payer: Self-pay | Attending: Family Medicine | Admitting: Family Medicine

## 2017-06-08 ENCOUNTER — Encounter: Payer: Self-pay | Admitting: Family Medicine

## 2017-06-08 VITALS — BP 92/68 | HR 108 | Temp 98.3°F | Wt 190.6 lb

## 2017-06-08 DIAGNOSIS — IMO0002 Reserved for concepts with insufficient information to code with codable children: Secondary | ICD-10-CM

## 2017-06-08 DIAGNOSIS — Z01419 Encounter for gynecological examination (general) (routine) without abnormal findings: Secondary | ICD-10-CM | POA: Insufficient documentation

## 2017-06-08 DIAGNOSIS — Z794 Long term (current) use of insulin: Secondary | ICD-10-CM | POA: Insufficient documentation

## 2017-06-08 DIAGNOSIS — Z79899 Other long term (current) drug therapy: Secondary | ICD-10-CM | POA: Insufficient documentation

## 2017-06-08 DIAGNOSIS — E1165 Type 2 diabetes mellitus with hyperglycemia: Secondary | ICD-10-CM

## 2017-06-08 DIAGNOSIS — E1142 Type 2 diabetes mellitus with diabetic polyneuropathy: Secondary | ICD-10-CM | POA: Insufficient documentation

## 2017-06-08 DIAGNOSIS — I10 Essential (primary) hypertension: Secondary | ICD-10-CM | POA: Insufficient documentation

## 2017-06-08 DIAGNOSIS — Z87891 Personal history of nicotine dependence: Secondary | ICD-10-CM | POA: Insufficient documentation

## 2017-06-08 DIAGNOSIS — Z124 Encounter for screening for malignant neoplasm of cervix: Secondary | ICD-10-CM

## 2017-06-08 DIAGNOSIS — E118 Type 2 diabetes mellitus with unspecified complications: Secondary | ICD-10-CM

## 2017-06-08 DIAGNOSIS — J449 Chronic obstructive pulmonary disease, unspecified: Secondary | ICD-10-CM | POA: Insufficient documentation

## 2017-06-08 LAB — GLUCOSE, POCT (MANUAL RESULT ENTRY): POC GLUCOSE: 115 mg/dL — AB (ref 70–99)

## 2017-06-08 MED ORDER — METFORMIN HCL 1000 MG PO TABS: 500.0000 mg | ORAL_TABLET | Freq: Two times a day (BID) | ORAL | 3 refills | Status: AC

## 2017-06-08 MED ORDER — ACETAMINOPHEN-CODEINE #3 300-30 MG PO TABS: 1.0000 | ORAL_TABLET | Freq: Two times a day (BID) | ORAL | 2 refills | Status: AC

## 2017-06-08 MED ORDER — CLONIDINE HCL 0.1 MG PO TABS: 0.1000 mg | ORAL_TABLET | Freq: Two times a day (BID) | ORAL | 11 refills | Status: AC

## 2017-06-08 NOTE — Patient Instructions (Addendum)
Carla Hicks was seen today for gynecologic exam and diabetes.  Diagnoses and all orders for this visit:  Pap smear for cervical cancer screening -     Cytology - PAP  Diabetic polyneuropathy associated with type 2 diabetes mellitus (HCC) -     POCT glucose (manual entry) -     acetaminophen-codeine (TYLENOL #3) 300-30 MG tablet; Take 1 tablet by mouth 2 (two) times daily.  Essential hypertension, benign -     cloNIDine (CATAPRES) 0.1 MG tablet; Take 1 tablet (0.1 mg total) by mouth 2 (two) times daily.  Uncontrolled type 2 diabetes mellitus with complication, with long-term current use of insulin (HCC) -     metFORMIN (GLUCOPHAGE) 1000 MG tablet; Take 0.5 tablets (500 mg total) by mouth 2 (two) times daily with a meal.  decrease metformin to 500 mg twice daily for one week If you still have no appetite, stop it all together.  Diabetes blood sugar goals  Fasting (in AM before breakfast, 8 hrs of no eating or drinking (except water or unsweetened coffee or tea): 90-110 2 hrs after meals: < 160,   No low sugars: nothing < 70   F/u in 3 weeks for weight check   Dr. Armen Pickup

## 2017-06-08 NOTE — Telephone Encounter (Signed)
Patient seen today Provided Rx for tylenol #3

## 2017-06-08 NOTE — Progress Notes (Signed)
Subjective:  Patient ID: Carla Hicks, female    DOB: [birth year]  Age: 46 y.o. MRN: [id number]  CC: Gynecologic Exam and Diabetes   HPI ADIAH GUERECA has diabetes, HTN, COPD, diastolic CHF she presents for   1. Pap smear: she denies vaginal discharge and pelvic pain. She reports unintentional  weight loss.   Social History  Substance Use Topics  . Smoking status: Former Smoker    Packs/day: 0.50    Years: 25.00    Types: Cigarettes  . Smokeless tobacco: Never Used  . Alcohol use No     Comment: a 40oz or a 12 pack of beer per day 02/10/17. 04/24/17 Pt reports she quit     Outpatient Medications Prior to Visit  Medication Sig Dispense Refill  . albuterol (PROVENTIL HFA;VENTOLIN HFA) 108 (90 Base) MCG/ACT inhaler Inhale 2 puffs into the lungs every 4 (four) hours as needed for wheezing or shortness of breath. 54 g 3  . amitriptyline (ELAVIL) 100 MG tablet Take 1 tablet (100 mg total) by mouth at bedtime. 30 tablet 2  . BREO ELLIPTA 100-25 MCG/INH AEPB Take 1 puff by mouth 2 (two) times daily.  3  . budesonide-formoterol (SYMBICORT) 80-4.5 MCG/ACT inhaler Inhale 2 puffs into the lungs 2 (two) times daily. Take 2 puffs first thing in am and then another 2 puffs about 12 hours later. 3 Inhaler 3  . cloNIDine (CATAPRES) 0.1 MG tablet Take 1 tablet (0.1 mg total) by mouth 2 (two) times daily. 60 tablet 11  . furosemide (LASIX) 40 MG tablet Take 1 tablet (40 mg total) by mouth 2 (two) times daily. 60 tablet 2  . guaiFENesin-codeine 100-10 MG/5ML syrup Take 10 mLs by mouth 2 (two) times daily. 600 mL 2  . ibuprofen (ADVIL) 200 MG tablet Take 1-2 tablets (200-400 mg total) by mouth every 6 (six) hours as needed for mild pain or moderate pain. 30 tablet 0  . insulin aspart (NOVOLOG) 100 UNIT/ML injection Inject 15 U with breakfast and  lunch, 10 U with dinner 10 mL 4  . insulin glargine (LANTUS) 100 unit/mL SOPN Inject 0.6 mLs (60 Units total) into the skin at bedtime. 90 mL 3  .  ipratropium-albuterol (DUONEB) 0.5-2.5 (3) MG/3ML SOLN Take 3 mLs by nebulization every 6 (six) hours as needed (sob/doe). 360 mL 3  . levofloxacin (LEVAQUIN) 750 MG tablet Take 1 tablet (750 mg total) by mouth daily. 12 tablet 0  . metFORMIN (GLUCOPHAGE) 1000 MG tablet Take 1 tablet (1,000 mg total) by mouth 2 (two) times daily with a meal. 180 tablet 3  . Multiple Vitamin (MULTIVITAMIN WITH MINERALS) TABS tablet Take 1 tablet by mouth daily.    . pantoprazole (PROTONIX) 40 MG tablet Take 1 tablet (40 mg total) by mouth daily before breakfast. 30 tablet 1  . Potassium Chloride ER 20 MEQ TBCR Take 20 mEq by mouth daily. 30 tablet 2  . pregabalin (LYRICA) 50 MG capsule Take 1 capsule (50 mg total) by mouth 3 (three) times daily. 270 capsule 3  . acetaminophen-codeine (TYLENOL #4) 300-60 MG tablet Take 1 tablet by mouth 2 (two) times daily. (Patient not taking: Reported on 06/08/2017) 60 tablet 0  . atorvastatin (LIPITOR) 20 MG tablet TAKE 1 TABLET BY MOUTH DAILY AT 6 PM (Patient not taking: Reported on 06/08/2017) 30 tablet 3  . gemfibrozil (LOPID) 600 MG tablet Take 1 tablet (600 mg total) by mouth 2 (two) times daily before a meal. (Patient not taking: Reported on  04/24/2017) 60 tablet 12   No facility-administered medications prior to visit.     ROS Review of Systems  Constitutional: Positive for unexpected weight change. Negative for chills and fever.  Eyes: Negative for visual disturbance.  Respiratory: Negative for shortness of breath.   Cardiovascular: Negative for chest pain.  Gastrointestinal: Negative for abdominal pain and blood in stool.  Musculoskeletal: Negative for arthralgias and back pain.  Skin: Negative for rash.  Allergic/Immunologic: Negative for immunocompromised state.  Hematological: Negative for adenopathy. Does not bruise/bleed easily.  Psychiatric/Behavioral: Negative for dysphoric mood and suicidal ideas.    Objective:  BP 92/68   Pulse (!) 108   Temp 98.3 F  (36.8 C) (Oral)   Wt 190 lb 9.6 oz (86.5 kg)   SpO2 97%   BMI 32.72 kg/m   BP/Weight 06/08/2017 05/06/2017 04/27/2017  Systolic BP 92 110 142  Diastolic BP 68 80 104  Wt. (Lbs) 190.6 201.8 -  BMI 32.72 34.64 -   Wt Readings from Last 3 Encounters:  06/08/17 190 lb 9.6 oz (86.5 kg)  05/06/17 201 lb 12.8 oz (91.5 kg)  04/26/17 210 lb 15.7 oz (95.7 kg)    Physical Exam  Constitutional: She appears well-developed and well-nourished. No distress.  Cardiovascular: Normal rate, regular rhythm, normal heart sounds and intact distal pulses.   Pulmonary/Chest: Effort normal and breath sounds normal.  Genitourinary: Vagina normal and uterus normal. Pelvic exam was performed with patient prone. There is no rash, tenderness or lesion on the right labia. There is no rash, tenderness or lesion on the left labia. Cervix exhibits no motion tenderness, no discharge and no friability.  Musculoskeletal: She exhibits no edema.  Lymphadenopathy:       Right: No inguinal adenopathy present.       Left: No inguinal adenopathy present.  Skin: Skin is warm and dry. No rash noted.    Lab Results  Component Value Date   HGBA1C 9.2 (H) 04/25/2017   CBG 115 Assessment & Plan:  Kristianne was seen today for gynecologic exam and diabetes.  Diagnoses and all orders for this visit:  Pap smear for cervical cancer screening -     Cytology - PAP  Diabetic polyneuropathy associated with type 2 diabetes mellitus (HCC) -     POCT glucose (manual entry) -     acetaminophen-codeine (TYLENOL #3) 300-30 MG tablet; Take 1 tablet by mouth 2 (two) times daily.  Essential hypertension, benign -     cloNIDine (CATAPRES) 0.1 MG tablet; Take 1 tablet (0.1 mg total) by mouth 2 (two) times daily.  Uncontrolled type 2 diabetes mellitus with complication, with long-term current use of insulin (HCC) -     metFORMIN (GLUCOPHAGE) 1000 MG tablet; Take 0.5 tablets (500 mg total) by mouth 2 (two) times daily with a meal.   There  are no diagnoses linked to this encounter.  No orders of the defined types were placed in this encounter.   Follow-up: Return in about 3 weeks (around 06/29/2017) for weight check .   Dessa Phi MD

## 2017-06-10 ENCOUNTER — Telehealth: Payer: Self-pay

## 2017-06-10 LAB — CYTOLOGY - PAP
ADEQUACY: ABSENT
Diagnosis: NEGATIVE
HPV (WINDOPATH): NOT DETECTED

## 2017-06-10 LAB — CERVICOVAGINAL ANCILLARY ONLY
CANDIDA VAGINITIS: NEGATIVE
Chlamydia: NEGATIVE
NEISSERIA GONORRHEA: NEGATIVE
TRICH (WINDOWPATH): NEGATIVE

## 2017-06-10 NOTE — Assessment & Plan Note (Signed)
Unintentional weight loss on metformin Reduce metformin  Close f/u for recheck

## 2017-06-10 NOTE — Telephone Encounter (Signed)
Per Dr. Hyman Hopes contact [facility]. [facility] and see what the cause of death is and to inform them we don't have a [facility] [place]. Spoke with Marylene Land and she informed me that pt passed away at home and she was told to bring Death Certificate to our office but Marylene Land stated she will have another brought over today. Dr. Hyman Hopes stated he will sign the certificate if Dr. Armen Pickup is not here when It comes

## 2017-06-14 ENCOUNTER — Encounter: Payer: Self-pay | Admitting: Internal Medicine

## 2017-06-14 DIAGNOSIS — J439 Emphysema, unspecified: Secondary | ICD-10-CM | POA: Insufficient documentation

## 2017-06-16 ENCOUNTER — Ambulatory Visit: Payer: Self-pay

## 2017-06-20 DEATH — deceased

## 2017-06-29 ENCOUNTER — Ambulatory Visit: Payer: Self-pay | Admitting: Family Medicine

## 2017-10-09 IMAGING — DX DG CHEST 2V
2 series · 2 of 2 positions shown · non-contrast
Comparison: 12/27/2015

CLINICAL DATA: Epigastric pain, shortness of breath, cough for 2
days, diabetes mellitus, asthma, former smoker

EXAM:
CHEST  2 VIEW

[chest lat]
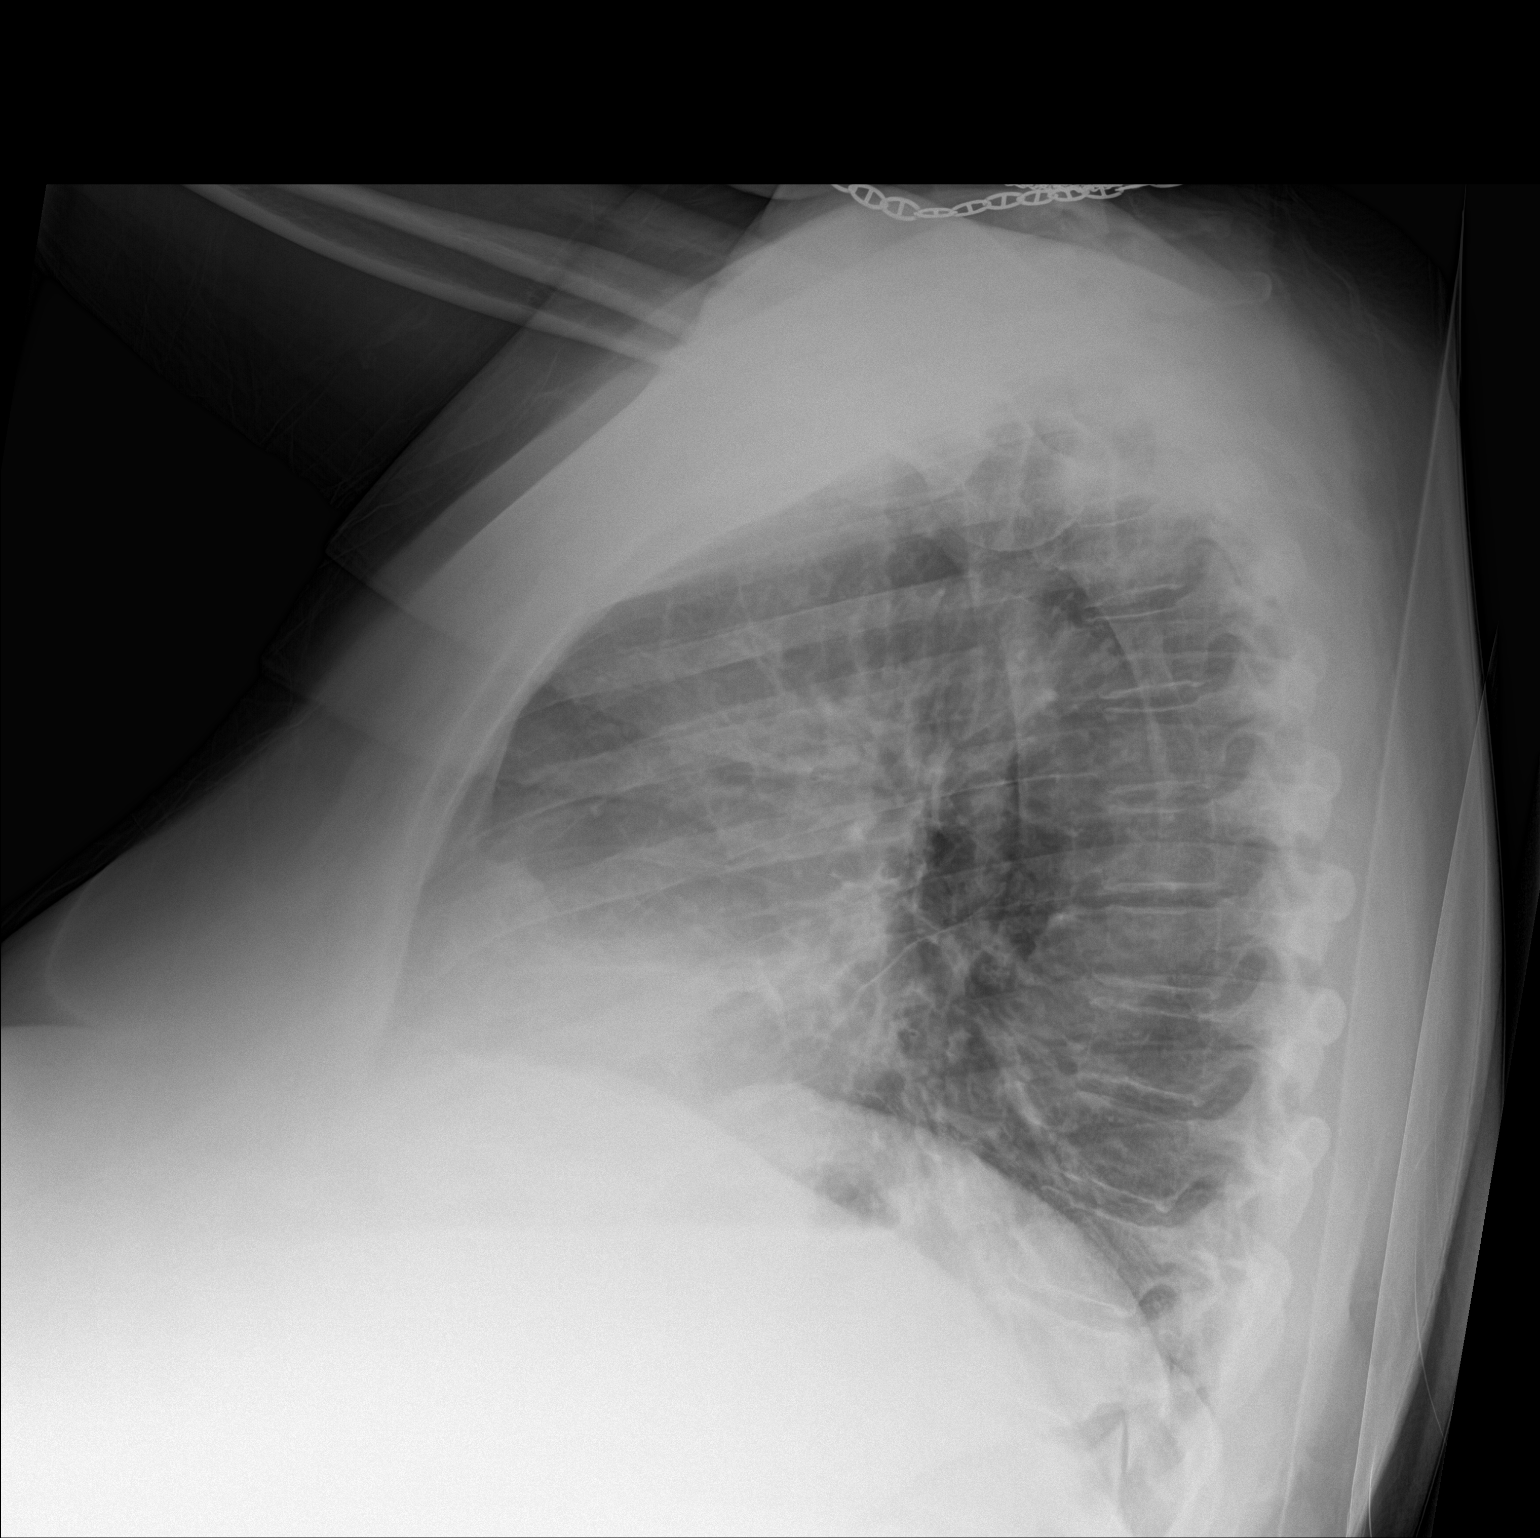

[chest ap]
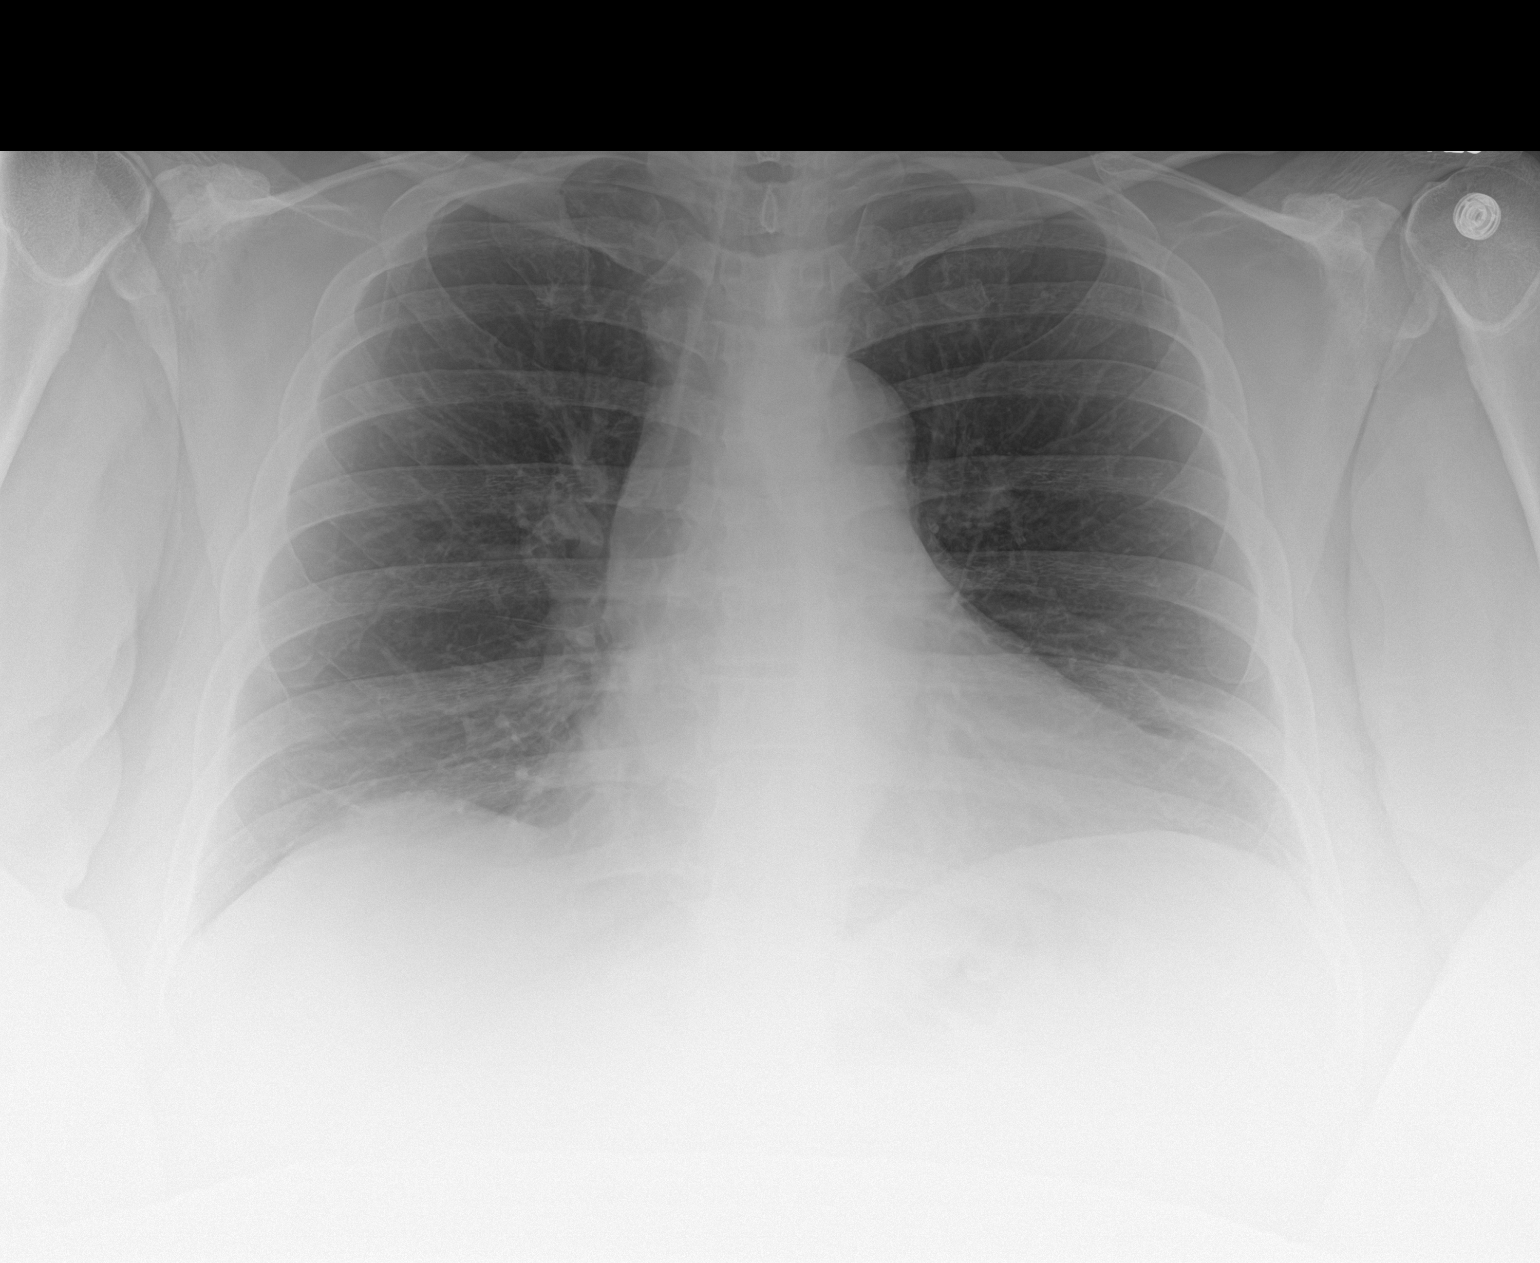

[2 of 2 positions shown; findings below may reference images not displayed]

FINDINGS: Upper normal heart size.

Mediastinal contours and pulmonary vascularity normal.

Minimal RIGHT basilar atelectasis.

Lungs otherwise clear.

No pleural effusion or pneumothorax.

Bones unremarkable.
IMPRESSION: Minimal RIGHT basilar atelectasis.

## 2017-10-09 IMAGING — DX DG FOOT COMPLETE 3+V*R*
3 series · 3 of 3 positions shown · non-contrast
Comparison: None

CLINICAL DATA: Stepped on nail with RIGHT foot 3 weeks ago, RIGHT
foot pain, diabetes mellitus, initial encounter

EXAM:
RIGHT FOOT COMPLETE - 3+ VIEW

[foot ap]
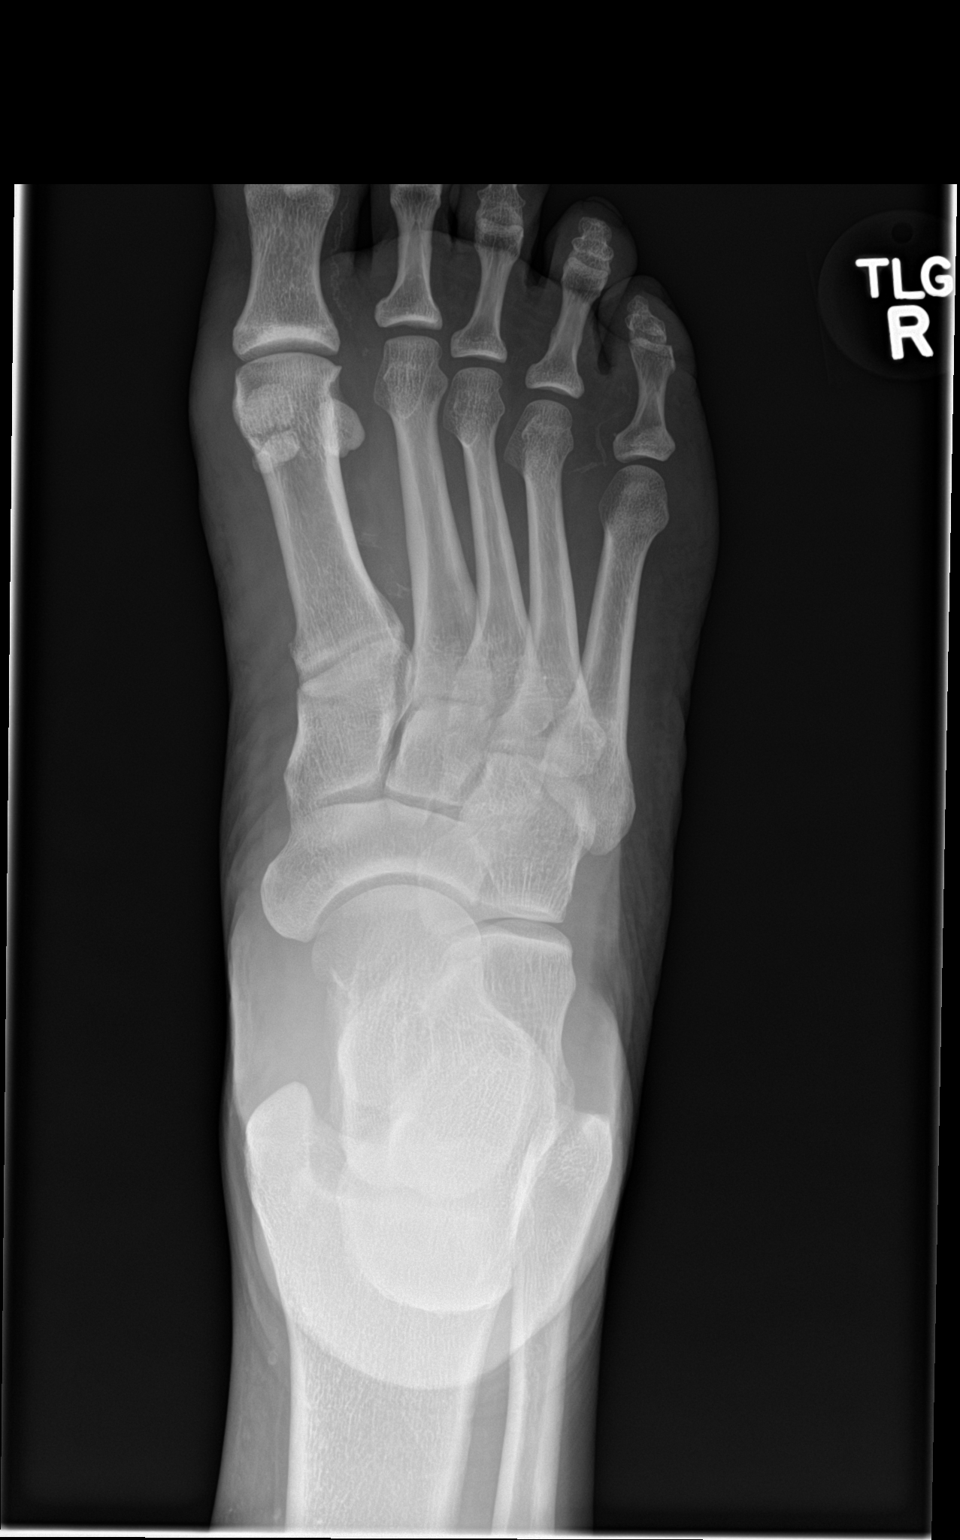

[foot obl]
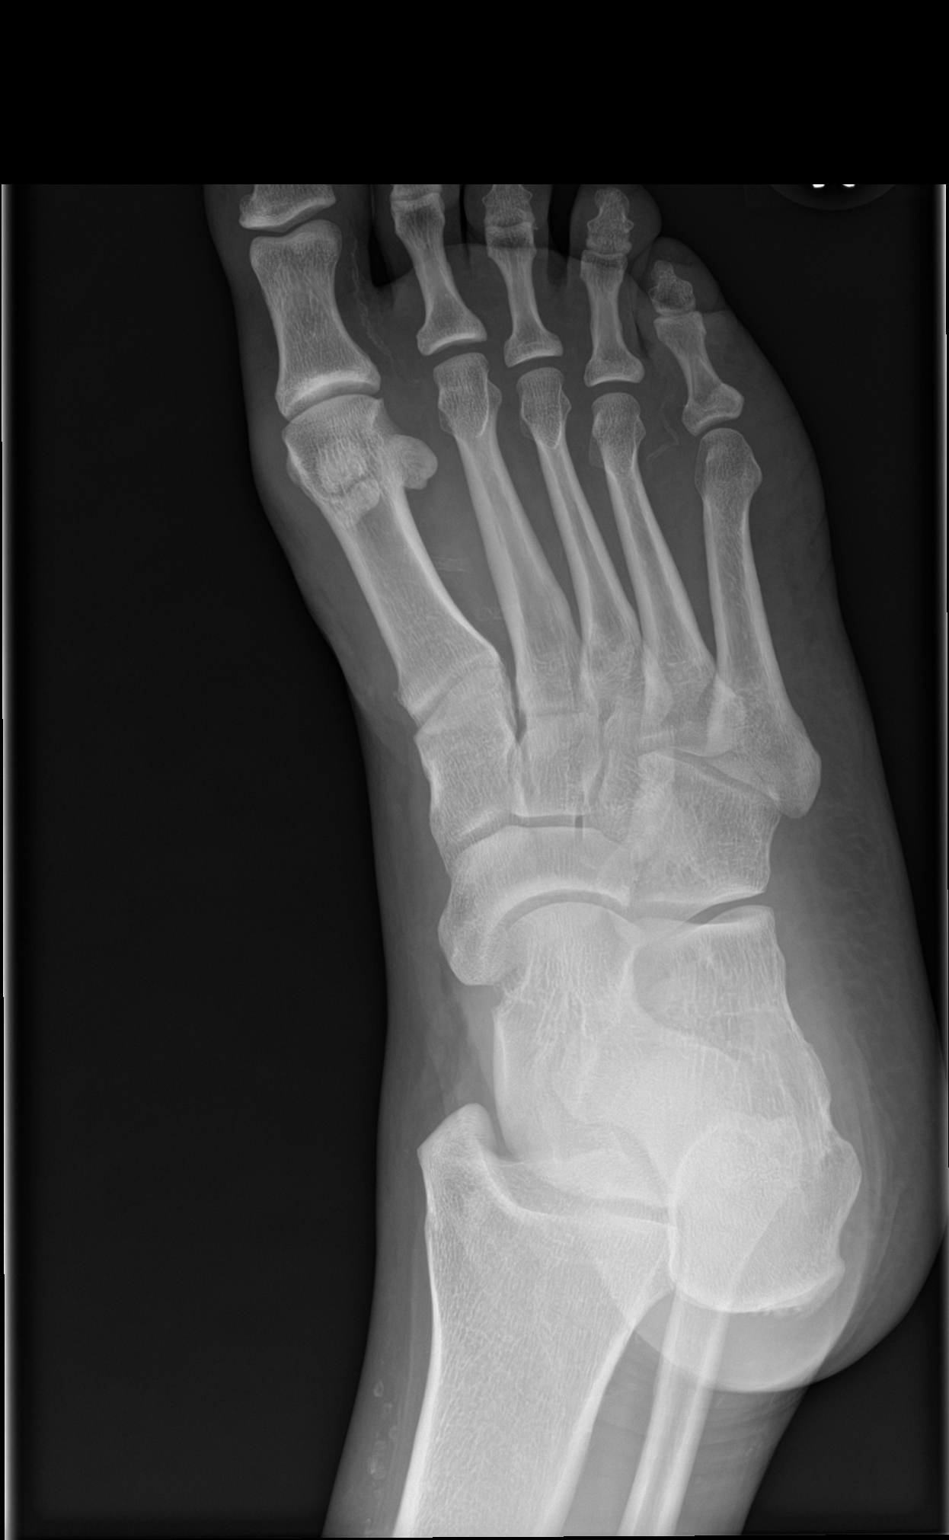

[foot lat]
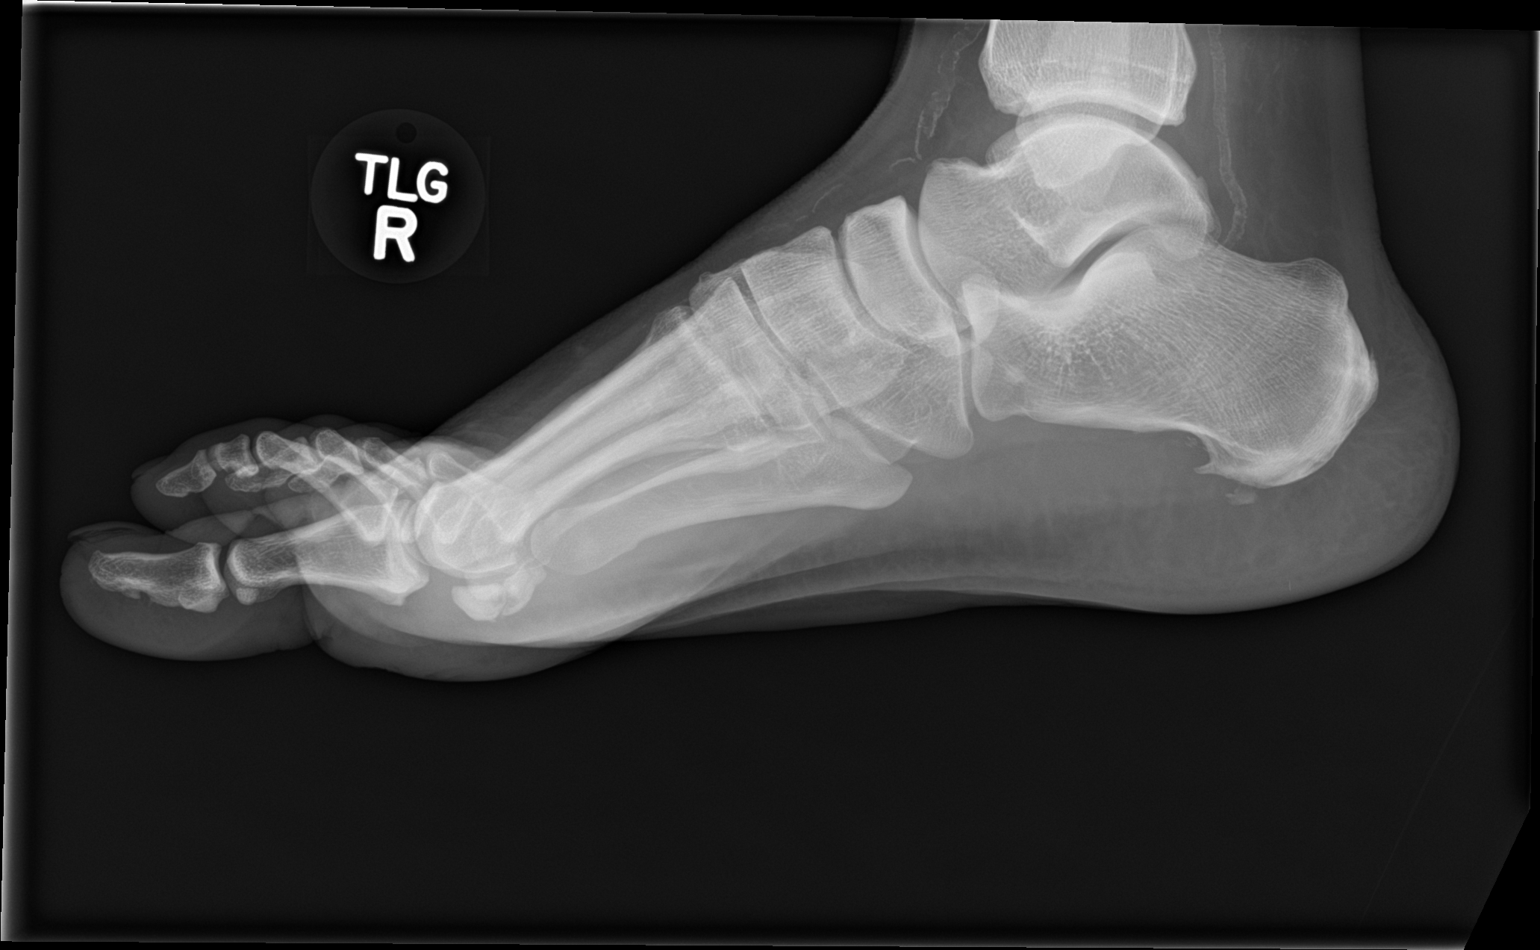

[3 of 3 positions shown; findings below may reference images not displayed]

FINDINGS: Osseous mineralization normal.

Joint spaces preserved.

Soft tissue swelling at medial RIGHT foot.

Tiny plantar calcaneal spur.

No acute fracture, dislocation or bone destruction.

No definite soft tissue gas.

Scattered small vessel vascular calcifications.
IMPRESSION: No definite acute bony abnormalities.
